# Patient Record
Sex: Female | Born: 1940 | Race: White | Hispanic: No | Marital: Single | Smoking: Former smoker
Health system: Southern US, Community
[De-identification: ages and names within clinical notes are randomized; demographics above are authoritative.]

## PROBLEM LIST (undated history)

## (undated) ENCOUNTER — Emergency Department (HOSPITAL_COMMUNITY): Admission: EM | Payer: Medicare Other | Source: Home / Self Care

## (undated) DIAGNOSIS — I209 Angina pectoris, unspecified: Secondary | ICD-10-CM

## (undated) DIAGNOSIS — F329 Major depressive disorder, single episode, unspecified: Secondary | ICD-10-CM

## (undated) DIAGNOSIS — R531 Weakness: Secondary | ICD-10-CM

## (undated) DIAGNOSIS — J189 Pneumonia, unspecified organism: Secondary | ICD-10-CM

## (undated) DIAGNOSIS — R350 Frequency of micturition: Secondary | ICD-10-CM

## (undated) DIAGNOSIS — J439 Emphysema, unspecified: Secondary | ICD-10-CM

## (undated) DIAGNOSIS — C801 Malignant (primary) neoplasm, unspecified: Secondary | ICD-10-CM

## (undated) DIAGNOSIS — R05 Cough: Secondary | ICD-10-CM

## (undated) DIAGNOSIS — K219 Gastro-esophageal reflux disease without esophagitis: Secondary | ICD-10-CM

## (undated) DIAGNOSIS — F319 Bipolar disorder, unspecified: Secondary | ICD-10-CM

## (undated) DIAGNOSIS — G252 Other specified forms of tremor: Secondary | ICD-10-CM

## (undated) DIAGNOSIS — J040 Acute laryngitis: Secondary | ICD-10-CM

## (undated) DIAGNOSIS — M1990 Unspecified osteoarthritis, unspecified site: Secondary | ICD-10-CM

## (undated) DIAGNOSIS — F419 Anxiety disorder, unspecified: Secondary | ICD-10-CM

## (undated) DIAGNOSIS — G4700 Insomnia, unspecified: Secondary | ICD-10-CM

## (undated) DIAGNOSIS — J449 Chronic obstructive pulmonary disease, unspecified: Secondary | ICD-10-CM

## (undated) DIAGNOSIS — R42 Dizziness and giddiness: Secondary | ICD-10-CM

## (undated) DIAGNOSIS — R609 Edema, unspecified: Secondary | ICD-10-CM

## (undated) DIAGNOSIS — Z8619 Personal history of other infectious and parasitic diseases: Secondary | ICD-10-CM

## (undated) DIAGNOSIS — R0602 Shortness of breath: Secondary | ICD-10-CM

## (undated) DIAGNOSIS — F32A Depression, unspecified: Secondary | ICD-10-CM

## (undated) DIAGNOSIS — R000 Tachycardia, unspecified: Secondary | ICD-10-CM

## (undated) HISTORY — PX: BREAST SURGERY: SHX581

## (undated) HISTORY — PX: CATARACT EXTRACTION: SUR2

## (undated) HISTORY — PX: OTHER SURGICAL HISTORY: SHX169

## (undated) HISTORY — PX: BLADDER SURGERY: SHX569

---

## 2002-03-22 ENCOUNTER — Inpatient Hospital Stay (HOSPITAL_COMMUNITY): Admission: EM | Admit: 2002-03-22 | Discharge: 2002-03-24 | Payer: Self-pay | Admitting: *Deleted

## 2002-03-22 ENCOUNTER — Encounter: Payer: Self-pay | Admitting: *Deleted

## 2002-09-20 ENCOUNTER — Encounter: Payer: Self-pay | Admitting: Emergency Medicine

## 2002-09-20 ENCOUNTER — Emergency Department (HOSPITAL_COMMUNITY): Admission: EM | Admit: 2002-09-20 | Discharge: 2002-09-20 | Payer: Self-pay | Admitting: Emergency Medicine

## 2002-10-06 ENCOUNTER — Emergency Department (HOSPITAL_COMMUNITY): Admission: EM | Admit: 2002-10-06 | Discharge: 2002-10-06 | Payer: Self-pay | Admitting: *Deleted

## 2002-10-23 ENCOUNTER — Emergency Department (HOSPITAL_COMMUNITY): Admission: EM | Admit: 2002-10-23 | Discharge: 2002-10-23 | Payer: Self-pay | Admitting: Emergency Medicine

## 2002-10-28 ENCOUNTER — Encounter: Payer: Self-pay | Admitting: *Deleted

## 2002-10-28 ENCOUNTER — Emergency Department (HOSPITAL_COMMUNITY): Admission: EM | Admit: 2002-10-28 | Discharge: 2002-10-28 | Payer: Self-pay | Admitting: *Deleted

## 2002-11-26 ENCOUNTER — Emergency Department (HOSPITAL_COMMUNITY): Admission: EM | Admit: 2002-11-26 | Discharge: 2002-11-26 | Payer: Self-pay | Admitting: *Deleted

## 2002-11-26 ENCOUNTER — Encounter: Payer: Self-pay | Admitting: *Deleted

## 2002-12-13 ENCOUNTER — Emergency Department (HOSPITAL_COMMUNITY): Admission: EM | Admit: 2002-12-13 | Discharge: 2002-12-13 | Payer: Self-pay | Admitting: Emergency Medicine

## 2003-02-20 ENCOUNTER — Emergency Department (HOSPITAL_COMMUNITY): Admission: EM | Admit: 2003-02-20 | Discharge: 2003-02-20 | Payer: Self-pay | Admitting: Emergency Medicine

## 2003-02-20 ENCOUNTER — Encounter: Payer: Self-pay | Admitting: *Deleted

## 2003-02-28 ENCOUNTER — Emergency Department (HOSPITAL_COMMUNITY): Admission: EM | Admit: 2003-02-28 | Discharge: 2003-02-28 | Payer: Self-pay | Admitting: Emergency Medicine

## 2003-03-08 ENCOUNTER — Emergency Department (HOSPITAL_COMMUNITY): Admission: EM | Admit: 2003-03-08 | Discharge: 2003-03-08 | Payer: Self-pay | Admitting: Emergency Medicine

## 2003-03-17 ENCOUNTER — Encounter: Payer: Self-pay | Admitting: Emergency Medicine

## 2003-03-17 ENCOUNTER — Emergency Department (HOSPITAL_COMMUNITY): Admission: EM | Admit: 2003-03-17 | Discharge: 2003-03-17 | Payer: Self-pay | Admitting: Emergency Medicine

## 2003-03-19 ENCOUNTER — Emergency Department (HOSPITAL_COMMUNITY): Admission: EM | Admit: 2003-03-19 | Discharge: 2003-03-19 | Payer: Self-pay | Admitting: Internal Medicine

## 2003-03-19 ENCOUNTER — Encounter: Payer: Self-pay | Admitting: Internal Medicine

## 2003-03-23 ENCOUNTER — Encounter: Payer: Self-pay | Admitting: *Deleted

## 2003-03-23 ENCOUNTER — Emergency Department (HOSPITAL_COMMUNITY): Admission: EM | Admit: 2003-03-23 | Discharge: 2003-03-23 | Payer: Self-pay | Admitting: *Deleted

## 2003-04-25 ENCOUNTER — Emergency Department (HOSPITAL_COMMUNITY): Admission: EM | Admit: 2003-04-25 | Discharge: 2003-04-25 | Payer: Self-pay | Admitting: *Deleted

## 2003-07-30 ENCOUNTER — Ambulatory Visit (HOSPITAL_COMMUNITY): Admission: RE | Admit: 2003-07-30 | Discharge: 2003-07-30 | Payer: Self-pay | Admitting: Pulmonary Disease

## 2003-08-16 ENCOUNTER — Encounter: Payer: Self-pay | Admitting: Emergency Medicine

## 2003-08-16 ENCOUNTER — Emergency Department (HOSPITAL_COMMUNITY): Admission: EM | Admit: 2003-08-16 | Discharge: 2003-08-16 | Payer: Self-pay | Admitting: Emergency Medicine

## 2003-10-15 ENCOUNTER — Emergency Department (HOSPITAL_COMMUNITY): Admission: EM | Admit: 2003-10-15 | Discharge: 2003-10-15 | Payer: Self-pay | Admitting: Emergency Medicine

## 2003-10-20 ENCOUNTER — Inpatient Hospital Stay (HOSPITAL_COMMUNITY): Admission: EM | Admit: 2003-10-20 | Discharge: 2003-10-23 | Payer: Self-pay | Admitting: Emergency Medicine

## 2003-10-30 ENCOUNTER — Emergency Department (HOSPITAL_COMMUNITY): Admission: EM | Admit: 2003-10-30 | Discharge: 2003-10-30 | Payer: Self-pay | Admitting: Emergency Medicine

## 2003-10-31 ENCOUNTER — Inpatient Hospital Stay (HOSPITAL_COMMUNITY): Admission: EM | Admit: 2003-10-31 | Discharge: 2003-11-12 | Payer: Self-pay | Admitting: Emergency Medicine

## 2003-11-15 ENCOUNTER — Emergency Department (HOSPITAL_COMMUNITY): Admission: EM | Admit: 2003-11-15 | Discharge: 2003-11-16 | Payer: Self-pay | Admitting: *Deleted

## 2004-04-03 ENCOUNTER — Emergency Department (HOSPITAL_COMMUNITY): Admission: EM | Admit: 2004-04-03 | Discharge: 2004-04-03 | Payer: Self-pay | Admitting: Emergency Medicine

## 2004-08-01 ENCOUNTER — Ambulatory Visit (HOSPITAL_COMMUNITY): Admission: RE | Admit: 2004-08-01 | Discharge: 2004-08-01 | Payer: Self-pay | Admitting: Pulmonary Disease

## 2004-10-22 ENCOUNTER — Emergency Department (HOSPITAL_COMMUNITY): Admission: EM | Admit: 2004-10-22 | Discharge: 2004-10-22 | Payer: Self-pay | Admitting: Emergency Medicine

## 2004-11-24 ENCOUNTER — Emergency Department (HOSPITAL_COMMUNITY): Admission: EM | Admit: 2004-11-24 | Discharge: 2004-11-24 | Payer: Self-pay | Admitting: Emergency Medicine

## 2004-12-27 ENCOUNTER — Ambulatory Visit: Payer: Self-pay | Admitting: Family Medicine

## 2004-12-29 ENCOUNTER — Ambulatory Visit (HOSPITAL_COMMUNITY): Admission: RE | Admit: 2004-12-29 | Discharge: 2004-12-29 | Payer: Self-pay | Admitting: Family Medicine

## 2005-01-09 ENCOUNTER — Emergency Department (HOSPITAL_COMMUNITY): Admission: EM | Admit: 2005-01-09 | Discharge: 2005-01-09 | Payer: Self-pay | Admitting: Emergency Medicine

## 2005-01-24 ENCOUNTER — Ambulatory Visit: Payer: Self-pay | Admitting: Family Medicine

## 2005-02-23 ENCOUNTER — Emergency Department (HOSPITAL_COMMUNITY): Admission: EM | Admit: 2005-02-23 | Discharge: 2005-02-23 | Payer: Self-pay | Admitting: Emergency Medicine

## 2005-02-28 ENCOUNTER — Emergency Department (HOSPITAL_COMMUNITY): Admission: EM | Admit: 2005-02-28 | Discharge: 2005-02-28 | Payer: Self-pay | Admitting: *Deleted

## 2005-03-13 ENCOUNTER — Emergency Department (HOSPITAL_COMMUNITY): Admission: EM | Admit: 2005-03-13 | Discharge: 2005-03-14 | Payer: Self-pay | Admitting: *Deleted

## 2005-03-19 ENCOUNTER — Emergency Department (HOSPITAL_COMMUNITY): Admission: EM | Admit: 2005-03-19 | Discharge: 2005-03-19 | Payer: Self-pay | Admitting: Emergency Medicine

## 2005-04-16 ENCOUNTER — Emergency Department (HOSPITAL_COMMUNITY): Admission: EM | Admit: 2005-04-16 | Discharge: 2005-04-16 | Payer: Self-pay | Admitting: Emergency Medicine

## 2005-04-27 ENCOUNTER — Emergency Department (HOSPITAL_COMMUNITY): Admission: EM | Admit: 2005-04-27 | Discharge: 2005-04-27 | Payer: Self-pay | Admitting: *Deleted

## 2005-05-02 ENCOUNTER — Emergency Department (HOSPITAL_COMMUNITY): Admission: EM | Admit: 2005-05-02 | Discharge: 2005-05-03 | Payer: Self-pay | Admitting: *Deleted

## 2005-07-11 ENCOUNTER — Emergency Department (HOSPITAL_COMMUNITY): Admission: EM | Admit: 2005-07-11 | Discharge: 2005-07-11 | Payer: Self-pay | Admitting: Emergency Medicine

## 2005-07-18 ENCOUNTER — Emergency Department (HOSPITAL_COMMUNITY): Admission: EM | Admit: 2005-07-18 | Discharge: 2005-07-18 | Payer: Self-pay | Admitting: Emergency Medicine

## 2005-07-29 ENCOUNTER — Emergency Department (HOSPITAL_COMMUNITY): Admission: EM | Admit: 2005-07-29 | Discharge: 2005-07-30 | Payer: Self-pay | Admitting: Emergency Medicine

## 2005-08-14 ENCOUNTER — Ambulatory Visit (HOSPITAL_COMMUNITY): Admission: RE | Admit: 2005-08-14 | Discharge: 2005-08-14 | Payer: Self-pay | Admitting: Obstetrics & Gynecology

## 2005-08-24 ENCOUNTER — Emergency Department (HOSPITAL_COMMUNITY): Admission: EM | Admit: 2005-08-24 | Discharge: 2005-08-24 | Payer: Self-pay | Admitting: Emergency Medicine

## 2005-08-25 ENCOUNTER — Encounter: Admission: RE | Admit: 2005-08-25 | Discharge: 2005-08-25 | Payer: Self-pay | Admitting: Obstetrics & Gynecology

## 2005-10-23 ENCOUNTER — Ambulatory Visit: Payer: Self-pay | Admitting: Orthopedic Surgery

## 2005-11-03 ENCOUNTER — Observation Stay (HOSPITAL_COMMUNITY): Admission: EM | Admit: 2005-11-03 | Discharge: 2005-11-04 | Payer: Self-pay | Admitting: Emergency Medicine

## 2005-11-15 ENCOUNTER — Ambulatory Visit (HOSPITAL_COMMUNITY): Admission: RE | Admit: 2005-11-15 | Discharge: 2005-11-15 | Payer: Self-pay | Admitting: Pulmonary Disease

## 2005-11-20 ENCOUNTER — Ambulatory Visit: Payer: Self-pay | Admitting: Orthopedic Surgery

## 2005-12-12 ENCOUNTER — Emergency Department (HOSPITAL_COMMUNITY): Admission: EM | Admit: 2005-12-12 | Discharge: 2005-12-12 | Payer: Self-pay | Admitting: Emergency Medicine

## 2005-12-18 ENCOUNTER — Ambulatory Visit: Payer: Self-pay | Admitting: Orthopedic Surgery

## 2006-02-14 ENCOUNTER — Emergency Department (HOSPITAL_COMMUNITY): Admission: EM | Admit: 2006-02-14 | Discharge: 2006-02-14 | Payer: Self-pay | Admitting: Emergency Medicine

## 2006-02-17 ENCOUNTER — Emergency Department (HOSPITAL_COMMUNITY): Admission: EM | Admit: 2006-02-17 | Discharge: 2006-02-17 | Payer: Self-pay | Admitting: Emergency Medicine

## 2006-02-19 ENCOUNTER — Encounter: Admission: RE | Admit: 2006-02-19 | Discharge: 2006-02-19 | Payer: Self-pay | Admitting: Obstetrics & Gynecology

## 2006-03-25 ENCOUNTER — Emergency Department (HOSPITAL_COMMUNITY): Admission: EM | Admit: 2006-03-25 | Discharge: 2006-03-25 | Payer: Self-pay | Admitting: Emergency Medicine

## 2006-04-02 ENCOUNTER — Emergency Department (HOSPITAL_COMMUNITY): Admission: EM | Admit: 2006-04-02 | Discharge: 2006-04-02 | Payer: Self-pay | Admitting: Emergency Medicine

## 2006-08-15 ENCOUNTER — Encounter: Admission: RE | Admit: 2006-08-15 | Discharge: 2006-08-15 | Payer: Self-pay | Admitting: Pulmonary Disease

## 2006-10-28 ENCOUNTER — Inpatient Hospital Stay (HOSPITAL_COMMUNITY): Admission: EM | Admit: 2006-10-28 | Discharge: 2006-11-01 | Payer: Self-pay | Admitting: Emergency Medicine

## 2006-11-01 ENCOUNTER — Inpatient Hospital Stay: Admission: AD | Admit: 2006-11-01 | Discharge: 2007-01-18 | Payer: Self-pay | Admitting: Pulmonary Disease

## 2006-11-28 ENCOUNTER — Ambulatory Visit (HOSPITAL_COMMUNITY): Admission: RE | Admit: 2006-11-28 | Discharge: 2006-11-28 | Payer: Self-pay | Admitting: Pulmonary Disease

## 2006-12-31 ENCOUNTER — Ambulatory Visit (HOSPITAL_COMMUNITY): Admission: RE | Admit: 2006-12-31 | Discharge: 2006-12-31 | Payer: Self-pay | Admitting: Pulmonary Disease

## 2007-01-03 ENCOUNTER — Ambulatory Visit (HOSPITAL_COMMUNITY): Admission: RE | Admit: 2007-01-03 | Discharge: 2007-01-03 | Payer: Self-pay | Admitting: Pulmonary Disease

## 2007-08-26 ENCOUNTER — Ambulatory Visit (HOSPITAL_COMMUNITY): Admission: RE | Admit: 2007-08-26 | Discharge: 2007-08-26 | Payer: Self-pay | Admitting: Pulmonary Disease

## 2007-11-14 ENCOUNTER — Encounter: Payer: Self-pay | Admitting: Family Medicine

## 2009-08-31 ENCOUNTER — Ambulatory Visit (HOSPITAL_COMMUNITY): Admission: RE | Admit: 2009-08-31 | Discharge: 2009-08-31 | Payer: Self-pay | Admitting: Pulmonary Disease

## 2009-12-07 ENCOUNTER — Ambulatory Visit (HOSPITAL_COMMUNITY): Admission: RE | Admit: 2009-12-07 | Discharge: 2009-12-07 | Payer: Self-pay | Admitting: Ophthalmology

## 2009-12-21 ENCOUNTER — Ambulatory Visit (HOSPITAL_COMMUNITY): Admission: RE | Admit: 2009-12-21 | Discharge: 2009-12-21 | Payer: Self-pay | Admitting: Ophthalmology

## 2010-12-13 NOTE — Letter (Signed)
Summary: rpc chart  rpc chart   Imported By: Curtis Sites 08/15/2010 11:23:18  _____________________________________________________________________  External Attachment:    Type:   Image     Comment:   External Document

## 2011-01-29 LAB — BASIC METABOLIC PANEL
Chloride: 97 mEq/L (ref 96–112)
Creatinine, Ser: 0.76 mg/dL (ref 0.4–1.2)
Glucose, Bld: 89 mg/dL (ref 70–99)
Sodium: 140 mEq/L (ref 135–145)

## 2011-01-29 LAB — HEMOGLOBIN AND HEMATOCRIT, BLOOD: HCT: 31.6 % — ABNORMAL LOW (ref 36.0–46.0)

## 2011-03-31 NOTE — Group Therapy Note (Signed)
NAME:  April Pearson, April Pearson                        ACCOUNT NO.:  [id number]   MEDICAL RECORD NO.:  [id number]                   PATIENT TYPE:  INP   LOCATION:  A310                                 FACILITY:  [facility]   PHYSICIAN:  Edward L. Juanetta Gosling, M.D.             DATE OF BIRTH:  03-May-1941   DATE OF PROCEDURE:  DATE OF DISCHARGE:                                   PROGRESS NOTE   PROBLEM LIST:  1. Chronic obstructive pulmonary disease with exacerbation.  2. 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   SUBJECTIVE:  April Pearson is very sluggish this morning. She has become less  and less independent, and this seems to be at least to some extent  correlated with the increase in the dose of her Risperdal. She is otherwise  doing about the same. She is less short of breath and looks a little better  as far as her breathing is concerned.   PHYSICAL EXAMINATION:  GENERAL:  Shows that she appears somewhat sedated  probably from her Risperdal and Xanax.  VITAL SIGNS:  Blood pressure 92/53, heart rate is 99 now, respirations 24  but unlabored, her temperature is 98.1. Her O2 saturation is 96% on 2  liters.   ASSESSMENT:  She seems to be doing better lungwise but having much more  problem as far as her ability to get around. She has what looks like a skin  tear on her buttocks. She is not getting up and moving around.   PLAN:  The plan is to try to get her up and moving. Get PT involved, which  they already are. Continue with her other treatments. We are working on  placement issues. I think she is definitely going to need short term  placement before she can return home.      ___________________________________________                                            Oneal Deputy. Juanetta Gosling, M.D.   ELH/MEDQ  D:  11/11/2003  T:  11/11/2003  Job:  [id number]

## 2011-03-31 NOTE — Group Therapy Note (Signed)
April Pearson, April Pearson              ACCOUNT NO.:  [id number]   MEDICAL RECORD NO.:  [id number]          PATIENT TYPE:  ORB   LOCATION:  S122                          FACILITY:  [facility]   PHYSICIAN:  Edward L. Juanetta Gosling, M.D.DATE OF BIRTH:  09-07-1941   DATE OF PROCEDURE:  12/29/2006  DATE OF DISCHARGE:                                 PROGRESS NOTE   Ms. Steven is doing fairly well but has an upper respiratory infection.  She has also had a lesion on the back of her head that seems to be  growing.  She says it has been there for sometime but it has grown since  she has been as the skilled care facility.  She is very weak still,  still having difficulty with PT, still using O2, nebulizers.   PHYSICAL EXAMINATION:  GENERAL:  Shows she is awake and alert, sitting  up.  CHEST:  Shows markedly decreased breath sounds.  She remains very weak.  HEENT:  She has what feels like a bony prominence on the back of her  head, on the right side near the occiput.  HEART:  Regular.   She is in a wheelchair.   ASSESSMENT:  1. She has profound weakness improving.  2. 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  She is not on      steroids.  I do not think that is related now.  3. She has severe chronic obstructive pulmonary disease which is end      stage on oxygen, on nebulizers.  4. She has mental illness, unchanged, doing fairly well.  5. She has hypertension, doing okay.  6. She has chronic tachycardia.  Her medications for hypertension seem      to be helping that as well.  7. She has a large lesion on the back of her skull.  We are going to      do an x-ray of that.  8. She has had an upper respiratory infection for which she is being      treated.      Edward L. Juanetta Gosling, M.D.  Electronically Signed     ELH/MEDQ  D:  12/29/2006  T:  12/29/2006  Job:  [id number]

## 2011-03-31 NOTE — Group Therapy Note (Signed)
NAME:  April Pearson, April Pearson                        ACCOUNT NO.:  [id number]   MEDICAL RECORD NO.:  [id number]                   PATIENT TYPE:  INP   LOCATION:  A310                                 FACILITY:  [facility]   PHYSICIAN:  Edward L. Juanetta Gosling, M.D.             DATE OF BIRTH:  1941/07/18   DATE OF PROCEDURE:  11/09/2003  DATE OF DISCHARGE:                                   PROGRESS NOTE   PROBLEMS:  1. Chronic obstructive pulmonary disease with exacerbation.  2. Mental illness.   SUBJECTIVE:  April Pearson says she is weak and cannot get out of the bed.  She  is actually sitting up in a chair right now.   OBJECTIVE:  Her exam shows her temperature is 98.3, pulse 116, respirations  18, blood pressure 133/73.  O2 saturation is 95% on 2 liters.  Her chest is  actually relatively clear.  She has some rhonchi but no wheezes now.  She  looks like she may have retained some fluid - probably from her steroid use.   ASSESSMENT:  Her heart rate is not as well controlled as I would like but I  think some of that is due to her weakness.  Her chest sounds better.  Her  blood pressure is under good control.   PLAN:  My plan is to have her continue her regular medications as before and  try to get PT involved and see if it will increase her ability to get  around.      ___________________________________________                                            Oneal Deputy Juanetta Gosling, M.D.   ELH/MEDQ  D:  11/09/2003  T:  11/09/2003  Job:  [id number]

## 2011-03-31 NOTE — H&P (Signed)
NAME:  April Pearson, April Pearson                        ACCOUNT NO.:  [id number]   MEDICAL RECORD NO.:  [id number]                   PATIENT TYPE:  INP   LOCATION:  A310                                 FACILITY:  [facility]   PHYSICIAN:  Mila Homer. Sudie Bailey, M.D.           DATE OF BIRTH:  02/15/1941   DATE OF ADMISSION:  10/31/2003  DATE OF DISCHARGE:                                HISTORY & PHYSICAL   HISTORY OF PRESENT ILLNESS:  This 70 year old woman has had several days of  difficulty breathing. She was seen in the emergency room the day prior to  admission at which time she had a white cell count of 16,000.  Chest x-ray  at that time showed COPD. CT of the chest done October 21, 2003, showed COPD  with some mild pericardial effusion.  In fact, she became so tight that she  came up to the emergency room today.   MEDICATIONS:  1. She is currently on Xanax 1 mg p.o. t.i.d.  2. Advair 250/50 b.i.d.  3. Risperdal at bedtime.  4. Cartia XT 180 mg daily.  5. Mucinex expectorant 600 mg b.i.d.  6. Lasix daily.   SOCIAL HISTORY:  She smoked cigarettes starting from at age 53 and ending at  age 50, with a total of 44 years. For many of the years she was smoking two  packs a day for a maximum total of 88-pack-years.  Currently, her mother  lives in [place]. She has at least one sister who lives here as well.   PHYSICAL EXAMINATION:  GENERAL:  Admission examination in the emergency room  showed a pleasant middle-age woman.  VITAL SIGNS:  Temperature is 97.2 p.o., BP 120/76, heart rate 107,  respiratory rate 24.  According to the ER doctor, she was so tight she was  not even wheezing, but after a couple of breathing treatments she started to  wheeze.  NECK:  There was no axillary or supraclavicular adenopathy.  LUNGS:  The lungs showed decreased breath sounds throughout, but she was  moving air at the time of my examination. She had no intercostal retractions  or use of accessory muscles for  respiration by the time I examined her.  HEART:  Regular rhythm, rate of about 90.  ABDOMEN:  Soft, without hepatosplenomegaly or mass.  EXTREMITIES:  No edema of the ankles.   LABORATORY DATA:  Blood gases on room air show pH 7.49, pCO2 40, pO2 57, and  oxygen saturation 93%.  White cell count 12,000 with 91% neutrophils.  D-  dimer was 22.   ADMISSION DIAGNOSES:  1. Chronic obstructive pulmonary disease exacerbation.  2. History of tobacco use.  3. Essential hypertension.   PLAN:  Solu-Medrol 125 mg IV q.6 h., Rocephin 1 g IV daily, Levaquin 5 mg  daily, albuterol/ipratropium nebulizer treatments q.4 h. while awake, p.r.n.  Tylenol, and alprazolam 1 mg t.i.d.     ___________________________________________  Mila Homer. Sudie Bailey, M.D.   SDK/MEDQ  D:  10/31/2003  T:  10/31/2003  Job:  [id number]   cc:   Ramon Dredge L. Juanetta Gosling, M.D.  [address]  [place]  [place] [address]  Fax: [phone]

## 2011-03-31 NOTE — Discharge Summary (Signed)
April Pearson, April Pearson              ACCOUNT NO.:  [id number]   MEDICAL RECORD NO.:  [id number]          PATIENT TYPE:  INP   LOCATION:  A319                          FACILITY:  [facility]   PHYSICIAN:  Edward L. Juanetta Gosling, M.D.DATE OF BIRTH:  06/18/1941   DATE OF ADMISSION:  10/28/2006  DATE OF DISCHARGE:  LH                               DISCHARGE SUMMARY   Potentially being transferred to a nursing home.   FINAL DISCHARGE DIAGNOSES:  1. Chronic obstructive pulmonary disease.  2. Mental illness with anxiety and depression.  3. Osteoporosis.  4. Tachycardia.  5. Hypertension.  6. Significant muscle weakness causing ambulatory difficulty.   HISTORY OF PRESENT ILLNESS:  This is a 70 year old with a long known  history of COPD. She has been in the emergency room and in my office on  multiple occasions over the last year. She has had problems in the past  with steroid myopathy so I have not been able to give her steroids for  this.   PAST MEDICAL HISTORY:  Positive for mental illness, COPD, osteoporosis,  tachycardia, and hypertension. Her mental illness seems to be related to  her COPD, but is a separate diagnosis.   PHYSICAL EXAMINATION ON ADMISSION:  VITAL SIGNS:  Temperature 96.5,  pulse 113, respirations 20, blood pressure 153/96, O2 sat 96%.  CHEST:  With decreased breath sounds. Mild wheezing.  HEART:  Regular with tachycardia.  ABDOMEN:  Soft.   pO2 was 56, pCO2 45, pH 7.4 on room air. Chest x-ray showed no acute  process. CT chest showed no pulmonary emboli or COPD, but did show  coronary artery calcifications.   HOSPITAL COURSE:  She was started on steroids initially. These were  discontinued. She had a PT consultation. It was felt that she was going  to need a nursing home placement for rehab. She is improved, but has a  great deal of difficulty with ambulation and she is discharged on Xanax  1 mg p.o. t.i.d., a no added salt regular diet, aspirin 81 mg daily,  diltiazem CD  240 one daily, Prozac 20 mg daily, Advair 1 puff b.i.d. of  the 250/50 size, Xopenex and  Atrovent nebulizer treatments 1.25 and 0.5 mg respectively q.8 h.,  Levaquin 500 mg daily, Risperdal 2 mg at bedtime, and she is going to  have PT/OT consultations in attempt to get her stronger and then back  home eventually. She will be on O2 at 2 liters.      Edward L. Juanetta Gosling, M.D.  Electronically Signed     ELH/MEDQ  D:  11/01/2006  T:  11/01/2006  Job:  [id number]

## 2011-03-31 NOTE — H&P (Signed)
NAME:  April Pearson, April Pearson                        ACCOUNT NO.:  [id number]   MEDICAL RECORD NO.:  [id number]                   PATIENT TYPE:  INP   LOCATION:  A309                                 FACILITY:  [facility]   PHYSICIAN:  Edward L. Juanetta Gosling, M.D.             DATE OF BIRTH:  12/28/1940   DATE OF ADMISSION:  10/20/2003  DATE OF DISCHARGE:                                HISTORY & PHYSICAL   REASON FOR ADMISSION:  COPD with exacerbation.   HISTORY:  This is a 70 year old who has been complaining of increasing  shortness of breath for about three months.  She has been to my office on  multiple occasions with really fairly normal physical examination except for  changes of COPD.  She has been on antibiotics, she has been on Advair and  has had very little relief.  She  came to the emergency room with increasing  shortness of breath, and after a workup in the emergency room she was  admitted.  She has had multiple steroid Dosepaks.  She has been on  antibiotics.  She was somewhat hypoxemic on room air.   PAST MEDICAL HISTORY:  Positive for:  1. COPD.  2. Episodes of pneumonia.  3. Severe anxiety and depression.  4. Tachycardia.   MEDICATIONS:  1. She has been on multiple antibiotics at home.  2. She has been on Risperdal, but I do not know the dose; she has been     [facility].  3. Xanax 1 mg p.o. three times daily.  4. Advair 250/50, 1 inhalation b.i.d.  5. Cardizem CD 180 daily.  6. Mucinex 600 mg b.i.d.   SOCIAL HISTORY:  She has a long smoking history but stopped some four or  five years ago.  She does not drink any alcohol.   REVIEW OF SYSTEMS:  Except as mentioned is negative.   FAMILY HISTORY:  Positive for hypertension.   PHYSICAL EXAMINATION:  GENERAL:  Today she looks fairly comfortable.  VITAL SIGNS:  Blood pressure 93/64, then 123/75, temperature is 99.2, pulse  112, respirations 20.  HEENT:  Her mucous membranes are slightly dry.   Her nose and throat are  clear.  NECK:  Supple.  Without masses.  CHEST:  Decreased breath sounds but no wheezes at all right now.  HEART:  Regular, with a mild tachycardia.  ABDOMEN:  Soft.  No masses are felt.  EXTREMITIES:  No edema.  CNS:  Grossly intact except she appears to be somewhat anxious.   LABORATORY DATA:  White count is 9400, hemoglobin is 12.1.  Her d-dimer is  0.22, which is normal.  Blood gas on room air shows a pH of 7.44, pCO2 of  40, pO2 of 65.  Electrolytes are normal.  Cardiac enzymes are normal.  BNP  is 42.2, which is normal.   ASSESSMENT:  She has shortness of breath and does have  a well-known history  of chronic obstructive pulmonary disease, but it is not clear if her problem  now is more related to her chronic obstructive pulmonary disease or perhaps  to anxiety or exactly what is going on.   PLAN:  The plan at this point is to go ahead with a CT scan of the chest.  Continue with her other treatments, nebulizers, antibiotics, etc., and  follow.     ___________________________________________                                         Oneal Deputy Juanetta Gosling, M.D.   ELH/MEDQ  D:  10/21/2003  T:  10/21/2003  Job:  [id number]

## 2011-03-31 NOTE — H&P (Signed)
April Pearson, ACKERT              ACCOUNT NO.:  [id number]   MEDICAL RECORD NO.:  [id number]          PATIENT TYPE:  INP   LOCATION:  A228                          FACILITY:  [facility]   PHYSICIAN:  Edward L. Juanetta Gosling, M.D.DATE OF BIRTH:  10-09-1941   DATE OF ADMISSION:  10/28/2006  DATE OF DISCHARGE:  [facility]                              HISTORY & PHYSICAL   REASON FOR ADMISSION:  COPD exacerbation.   HISTORY:  This is a 70 year old who has had a long known history of COPD  and who has had significant problems with multiple emergency room  visits, multiple visits to my office over the last year or so.  She has  had increasing shortness of breath for several days.  She has been  coughing, bringing up some sputum.  She has had more shortness of  breath.  She has not had any definitive fever.   PAST MEDICAL HISTORY:  1. [facility] She does have anxiety and depression.  2. She has COPD.  3. She has osteoporosis.  4. She has had tachycardia and hypertension.   FAMILY HISTORY:  Positive for coronary disease and hypertension.   PAST SURGICAL HISTORY:  1. She has had tubal ligation.  2. Lumpectomy of the breast.   SOCIAL HISTORY:  She has about a 70 pack-year smoking history, but  stopped about 5 years ago.  She does not drink any alcohol.  She does  not use any other drugs.   She has difficulty tolerating STEROIDS, and had an episode of steroid  myopathy, and we have tried to avoid the use of steroids with her.   She unfortunately has. despite being instruction in the office on  multiple occasions that she needs to stay on Advair as a preventive  medication, she almost always uses it as a p.r.n., and she is off her  Advair again.  I suspect that that is why she is having this acute  exacerbation.   MEDICATIONS AT HOME:  1. Xanax 1 mg t.i.d.  2. Risperdal 2 mg at bedtime.  3. Advair 250/50, supposed to be 1 puff b.i.d.,  but she uses it as      needed.  4. Albuterol inhaler as needed.  5. Prozac 20 mg daily.  6. She is supposed to be on Cardizem, but apparently is not taking it.   PHYSICAL EXAMINATION:  GENERAL:  Alert and oriented.  HEENT:  Her nose and throat are clear.  Her neck is supple, without  masses.  Her pupils do react to light and accommodation.  Her mucous  membranes are minimally dry.  VITAL SIGNS:  Her temperature is 96.5, pulse 113, respirations 20, blood  pressure 153/96, O2 saturations 96% on 2 liters.  CHEST:  Shows mild wheezing, some decrease in breath sounds.  HEART:  Regular, with a tachycardia.  No murmur.  No gallop.  ABDOMEN:  Soft without masses.  She does have active bowel sounds.  EXTREMITIES:  Showed no edema.  CNS:  She is grossly intact.  PSYCHIATRIC:  She remains with a somewhat flat affect.   LABORATORY WORK:  White count is 9500, hemoglobin is 13.8, platelets  427.  BMP:  Glucose 111, otherwise normal.  D-dimer 0.22, which is  normal. Blood gas:  pO2 is 56, pCO2 of 45, pH 7.40.  Chest x-ray:  No  acute cardiopulmonary process. CT chest: No pulmonary emboli, COPD,  coronary artery calcifications.   ASSESSMENT:  She has chronic obstructive pulmonary disease with  exacerbation.  She has a chronic tachycardia and hypertension.  She is  off her medication for that. She has had difficulty with understanding  how to take her preventive medications, and at this point I think we  need to go ahead with a short burst of steroids.  I do not want her on  them for very long. Continue with all the other treatments and follow.      Edward L. Juanetta Gosling, M.D.  Electronically Signed     ELH/MEDQ  D:  10/29/2006  T:  10/29/2006  Job:  [id number]
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
## 2011-03-31 NOTE — Group Therapy Note (Signed)
NAME:  April Pearson, April Pearson                        ACCOUNT NO.:  [id number]   MEDICAL RECORD NO.:  [id number]                   PATIENT TYPE:  INP   LOCATION:  A310                                 FACILITY:  [facility]   PHYSICIAN:  Edward L. Juanetta Gosling, M.D.             DATE OF BIRTH:  [birth year]   DATE OF PROCEDURE:  DATE OF DISCHARGE:                                   PROGRESS NOTE   PROBLEMS:  1. Chronic obstructive pulmonary disease with exacerbation.  2. Hypertension.  3. Sinus tachycardia.  4. Mental illness with marked anxiety related to that.   SUBJECTIVE:  Ms. Bradstreet says she is still having  a great deal of difficulty  with breathing.  She was admitted yesterday with an exacerbation of her  COPD.   PHYSICAL EXAMINATION:  VITAL SIGNS:  Show her temperature is 97.6, pulse is  84, respirations 18, blood pressure 124/76.  O2 sat 96% on 2 liters.  GENERAL:  She actually looks pretty comfortable.  CHEST:  Decreased breath sounds bilaterally.  HEART:  Regular without tachycardia now.   ASSESSMENT:  She has severe chronic obstructive pulmonary disease and also  consequential severe anxiety and these are feeding each other.  Her blood  pressure is well controlled.  She does not have any tachycardia at this  point.   PLAN:  Continue with her treatment.  There is really not much else to add.  She is on maximum medical treatment at this point.      ___________________________________________                                            Oneal Deputy. Juanetta Gosling, M.D.   ELH/MEDQ  D:  11/01/2003  T:  11/01/2003  Job:  [id number]

## 2011-03-31 NOTE — Group Therapy Note (Signed)
NAME:  April Pearson, April Pearson                        ACCOUNT NO.:  [id number]   MEDICAL RECORD NO.:  [id number]                   PATIENT TYPE:  INP   LOCATION:  A310                                 FACILITY:  [facility]   PHYSICIAN:  Edward L. Juanetta Gosling, M.D.             DATE OF BIRTH:  1941-08-08   DATE OF PROCEDURE:  11/06/2003  DATE OF DISCHARGE:                                   PROGRESS NOTE   PROBLEMS:  1. Chronic obstructive pulmonary disease.  2. Mental illness.  3. Hypertension.   SUBJECTIVE:  April Pearson says she feels better.  This is the first time she  has said that she felt better in some time.  The evaluation by the  behavioral health team is noted and appreciated.   OBJECTIVE:  Her physical exam today shows that she looks a little more  comfortable.  Her temperature is 99.2, pulse 104, respirations 18, blood  pressure 128/90, O2 saturation is 93% on room air.  Her chest is much  clearer but she still has wheezes at end expiration.   ASSESSMENT:  She is better but not well.   PLAN:  Continue with her treatments as before.  At least she is improved  somewhat.      ___________________________________________                                            Oneal Deputy. Juanetta Gosling, M.D.   ELH/MEDQ  D:  11/06/2003  T:  11/06/2003  Job:  [id number]

## 2011-03-31 NOTE — Group Therapy Note (Signed)
NAME:  April Pearson, April Pearson                        ACCOUNT NO.:  [id number]   MEDICAL RECORD NO.:  [id number]                   PATIENT TYPE:  INP   LOCATION:  A310                                 FACILITY:  [facility]   PHYSICIAN:  Edward L. Juanetta Gosling, M.D.             DATE OF BIRTH:  05-12-1941   DATE OF PROCEDURE:  DATE OF DISCHARGE:                                   PROGRESS NOTE   PROBLEM LIST:  1. Chronic obstructive pulmonary disease with exacerbation.  2. Hypertension.  3. Sinus tachycardia.  4. Mental illness.   SUBJECTIVE:  Ms. Graff says she is a little better. She continues to be  short of breath with fairly minimal exertion.   OBJECTIVE:  VITAL SIGNS:  Her exam today shows her blood pressure 102/72,  heart rate 110, respirations 20, temperature 97.1, O2 saturation 96% on 2  liters.  CHEST:  Her chest is clearer, though she does have bilateral rhonchi.  HEART:  Her heart is regular.  ABDOMEN:  Soft.  EXTREMITIES:  She has trace edema of the extremities.   ASSESSMENT:  She is a little better. I have discussed the situation with the  nursing staff. They are concerned that she just doesn't seem quite right  today, perhaps a little more pale, just not doing as well; so I am going to  go head and check a BMET and CBC today. As mentioned, her O2 saturation is  okay.   PLAN:  Plan now is definitely for at least short term nursing home  placement, as she is really not going to be able to manage at home.      ___________________________________________                                            Oneal Deputy. Juanetta Gosling, M.D.   ELH/MEDQ  D:  11/10/2003  T:  11/10/2003  Job:  [id number]

## 2011-03-31 NOTE — Group Therapy Note (Signed)
[facility]  Patient:    [name], [name] Visit Number: [id number] MRN: [id number]          Service Type: MED Location: 3A A320 01 Attending Physician:  Fredirick Maudlin Dictated by:   Kari Baars, M.D. Proc. Date: 03/23/02 Admit Date:  03/22/2002 Discharge Date: 03/24/2002                               Progress Note  PROBLEMS 1. Chronic obstructive pulmonary disease. 2. Pneumonia. 3. Anxiety and depression.  SUBJECTIVE:  [name] says she is feeling much better this morning.  She has no new complaints.  OBJECTIVE:  Her physical examination shows that her chest is clearer than it has been.  By description her heart is regular.  Her abdomen is soft. Extremities:  Showed no edema.  ASSESSMENT:  She is much improved.  PLAN:  For transfer to a regular room today.  She does not need the monitor any more.  Will attempt to try to get her ready for a possible discharge tomorrow. Dictated by:   Kari Baars, M.D. Attending Physician:  Fredirick Maudlin DD:  03/23/02 TD:  03/24/02 Job: [id number]

## 2011-03-31 NOTE — Discharge Summary (Signed)
[facility]  Patient:    CAMALA, TALWAR Visit Number: [id number] MRN: [id number]          Service Type: MED Location: 3A A320 01 Attending Physician:  Fredirick Maudlin Dictated by:   Kari Baars, M.D. Admit Date:  03/22/2002 Discharge Date: 03/24/2002                             Discharge Summary  FINAL DISCHARGE DIAGNOSES: 1. Pneumonia. 2. Chronic obstructive pulmonary disease. 3. Anxiety and depression.  HISTORY:  Ms. Garside is a 70 year old who came to the emergency room with complaints of shortness of breath.  Was found to have a right lung pneumonia. Her examination showed that she was febrile.  She appeared to be short of breath.  Her chest showed rhonchi bilaterally.  Her abdomen was soft.  ASSESSMENT:  At the time of admission was that she had pneumonia.  PLAN:  Was to continue her treatments with her anxiolytics and antidepressants.  She was started on intravenous antibiotics, intravenous fluids, and improved fairly rapidly.  By the time of discharge she said she was back to her baseline and she was discharged home on Levaquin 500 mg daily x10 more days.  She is going to continue her nebulizer treatments at home and follow up in my office in about a month. Dictated by:   Kari Baars, M.D. Attending Physician:  Fredirick Maudlin DD:  03/24/02 TD:  03/25/02 Job: [id number]

## 2011-03-31 NOTE — Group Therapy Note (Signed)
NAMEDETRICE, CALES              ACCOUNT NO.:  [id number]   MEDICAL RECORD NO.:  [id number]          PATIENT TYPE:  INP   LOCATION:  A319                          FACILITY:  [facility]   PHYSICIAN:  Edward L. Juanetta Gosling, M.D.DATE OF BIRTH:  09/18/1941   DATE OF PROCEDURE:  DATE OF DISCHARGE:                                 PROGRESS NOTE   PROBLEM:  COPD exacerbation, weakness, mental illness, hypertension.   SUBJECTIVE:  Ms. Beever is sleepy this morning.  I believe she got a  sleeping pill last night.  Otherwise, she is about the same.   PHYSICAL EXAMINATION:  VITAL SIGNS:  Temperature is 97, pulse 85,  respirations 16, blood pressure 129/65, O2 saturations 95% on 2 liters.  CHEST:  Her chest is much clearer, and she overall looks better.   ASSESSMENT:  She is improving.   PLAN:  She is set for discharge to a nursing home when a bed is  available.  Please see discharge summary for details.      Edward L. Juanetta Gosling, M.D.  Electronically Signed     ELH/MEDQ  D:  11/01/2006  T:  11/01/2006  Job:  [id number]

## 2011-03-31 NOTE — Discharge Summary (Signed)
NAME:  April Pearson, April Pearson                        ACCOUNT NO.:  [id number]   MEDICAL RECORD NO.:  [id number]                   PATIENT TYPE:  INP   LOCATION:  A310                                 FACILITY:  [facility]   PHYSICIAN:  Edward L. Juanetta Gosling, M.D.             DATE OF BIRTH:  Jun 04, 1941   DATE OF ADMISSION:  10/31/2003  DATE OF DISCHARGE:                                 DISCHARGE SUMMARY   FINAL DIAGNOSES:  1. Chronic obstructive pulmonary disease with exacerbation.  2. 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  3. Hypertension.  4. Chronic tachycardia.  5. Decreased ability to ambulate related to bedrest.   HISTORY:  [name] is a 70 year old who has had a long known history of  COPD. She had been seen in the emergency room on several occasions. At the  time that she came for admission, she was noted to have a white count of  16,000. Chest x-ray showed COPD without infiltrate. Her physical exam showed  that she was very anxious. Temperature 97.2, blood pressure 120/76, heart  rate 107, respiratory rate 24. She had decreased breath sounds. She was  quite tight but had some wheezing. She did have some use of accessory  muscles of respiratory early in her course, but this resolved later. Blood  gas on admission showed a pH of 7.49, pCO2 of 40, pO2 of 57, O2 saturation  at 93% on room air. White count was 12,000. D-dimer 2.2, but she had just  had a CT of the chest.   HOSPITAL COURSE:  She was admitted, started on nebulizer treatments, and  slowly improved. A lot of the problem during her hospitalization was with  her mental illness. Consultation was obtained with the behavioral health,  and it was felt that she should have an increased dose of Risperdal. She  became somewhat sluggish and somnolent with that, so she had the dose  decreased. At the time of discharge, she is discharged to the nursing home  on:  1. Albuterol plus Atrovent nebulizer treatments q.4h. p.r.n.  2. Xanax 1 mg p.o.  q.6h. on a scheduled basis but it can be held if she is     too somnolent.  3. Advair 250/50 one puff b.i.d.  4. Guaifenesin 1,200 mg p.o. b.i.d.  5. KCl 20 mEq b.i.d.  6. Prednisone 40 mg daily for three days, 30 mg daily for three days, 20 mg     daily for three days, and then 10 mg daily for three days and stop.  7. Risperdal 1 mg in the morning, 2 mg at h.s.  8. Theophylline 200 mg p.o. b.i.d.  9. Cardizem CD 180 one daily.  10.      She is on O2 at 2 liters.   DIET:  She will be on a no-added salt regular diet.   FOLLOW UP:  She does need to make an appointment for followup at the mental  health center. She needs physical therapy, occupational therapy, and  respiratory therapy treatments while in the nursing home.     ___________________________________________                                         Oneal Deputy. Juanetta Gosling, M.D.   ELH/MEDQ  D:  11/12/2003  T:  11/12/2003  Job:  [id number]

## 2011-03-31 NOTE — Group Therapy Note (Signed)
NAME:  April Pearson, April Pearson                        ACCOUNT NO.:  [id number]   MEDICAL RECORD NO.:  [id number]                   PATIENT TYPE:  INP   LOCATION:  A310                                 FACILITY:  [facility]   PHYSICIAN:  Edward L. Juanetta Gosling, M.D.             DATE OF BIRTH:  07-27-1941   DATE OF PROCEDURE:  11/12/2003  DATE OF DISCHARGE:                                   PROGRESS NOTE   PROBLEMS:  1. Chronic obstructive pulmonary disease with exacerbation.  2. Hypertension.  3. Chronic sinus tachycardia.  4. Mental illness, unknown type with severe anxiety.   HISTORY OF PRESENT ILLNESS:  April Pearson is much improved today.  She is less  sluggish, much more responsive.   PHYSICAL EXAMINATION:  VITAL SIGNS:  Temperature 98.2, pulse 109,  respirations 20, blood pressure 102/56 (It has been 122/72).  CHEST:  Clearer.  O2 saturation 94% on 2 liters.  HEART:  Regular with a slight tachycardia.   IMPRESSION:  Overall, she looks much better.   PLAN:  She is set for transfer to a skilled care facility.  Please see  Discharge Summary for details.      ___________________________________________                                            Oneal Deputy Juanetta Gosling, M.D.   ELH/MEDQ  D:  11/12/2003  T:  11/12/2003  Job:  [id number]

## 2011-03-31 NOTE — Group Therapy Note (Signed)
NAMEJOSSELIN, April Pearson              ACCOUNT NO.:  [id number]   MEDICAL RECORD NO.:  [id number]          PATIENT TYPE:  ORB   LOCATION:  S122                          FACILITY:  [facility]   PHYSICIAN:  Edward L. Juanetta Gosling, M.D.DATE OF BIRTH:  Sep 16, 1941   DATE OF PROCEDURE:  DATE OF DISCHARGE:                                 PROGRESS NOTE   The patient at the [facility].  [name] is overall, I think, about  the same.  She says she is breathing okay.  She is using her oxygen.  She is using nebulizers.  She is perhaps some better.  She does not have  any other new complaints.  She is slowly progressing in PT but still  quite weak and not able yet to get to manage on her own.   ASSESSMENT:  1. She has profound weakness.  2. She has a history of steroid myopathy, but she is not on any      steroids.  3. She has severe end-stage chronic obstructive pulmonary disease.  4. She has mental illness which is unchanged.  5. She has hypertension which is doing okay.  6. She has tachycardia which is also doing okay.   PLAN:  My plan then is to continue with her medications and treatments  and have her follow from there.  No other new medications right now.  Hopefully, she is going to get well enough to be able to go home, but it  is not totally clear.      Edward L. Juanetta Gosling, M.D.  Electronically Signed     ELH/MEDQ  D:  11/21/2006  T:  11/21/2006  Job:  [id number]

## 2011-03-31 NOTE — Group Therapy Note (Signed)
NAME:  April Pearson, GUTMANN                        ACCOUNT NO.:  [id number]   MEDICAL RECORD NO.:  [id number]                   PATIENT TYPE:  INP   LOCATION:  A309                                 FACILITY:  [facility]   PHYSICIAN:  Edward L. Juanetta Gosling, M.D.             DATE OF BIRTH:  Apr 29, 1941   DATE OF PROCEDURE:  10/23/2003  DATE OF DISCHARGE:  10/23/2003                                   PROGRESS NOTE   PROBLEMS:  1. Chronic obstructive pulmonary disease with exacerbation.  2. Anxiety and depression.  3. 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   SUBJECTIVE:  Ms. Allmendinger is about the same.  She has no new complaints at this  time.  She has discussed with our social worker that she feels part of her  problem is that she becomes very anxious and has been in and out of my  office with shortness of breath that she thinks is started by anxiety.  Multiple options were offered to her to help with this including placement  in a rest home - which actually I think is a reasonable solution, but Ms.  Coba does not want to do that.  Considering that, I am not sure what other  options we have at this point.   OBJECTIVE:  Her exam today shows her blood pressure 125/54, heart rate 73,  respirations 20, temperature is 97.4, O2 saturation 97% on 2 liters.  Her  chest is actually quite clear.   ASSESSMENT:  She is doing better.   PLAN:  I am going to plan for her to be discharged today.  Please see  Discharge Summary for details.      ___________________________________________                                            Oneal Deputy Juanetta Gosling, M.D.   ELH/MEDQ  D:  10/23/2003  T:  10/23/2003  Job:  [id number]

## 2011-03-31 NOTE — Group Therapy Note (Signed)
NAME:  April Pearson, SHORTS                        ACCOUNT NO.:  [id number]   MEDICAL RECORD NO.:  [id number]                   PATIENT TYPE:  INP   LOCATION:  A310                                 FACILITY:  [facility]   PHYSICIAN:  Edward L. Juanetta Gosling, M.D.             DATE OF BIRTH:  [birth year]   DATE OF PROCEDURE:  11/03/2003  DATE OF DISCHARGE:                                   PROGRESS NOTE   PROBLEM:  Chronic obstructive pulmonary disease.   SUBJECTIVE:  Ms. Schwark continues short of breath.  She says she is not  coughing anything up.  She is coughing a little bit.   OBJECTIVE:  Her physical exam shows that her temperature is 97, pulse 106,  respirations 22, blood pressure 151/92.  Her chest is fairly clear, she has  minimal wheezes.  Her O2 saturation is 94% on 2 liters.   ASSESSMENT:  She still has problems with her COPD.  Her blood pressure is  not totally controlled.  Her heart rate is also not totally controlled.  I  am concerned that because of the amount of steroids that she has had she may  have some fluid retention.   PLAN:  The plan is to go ahead and give her some Lasix today.  I am going to  try to get her up and out of the bed.  I think that may help a bit anyway.  No other changes in medications or treatments today.      ___________________________________________                                            Oneal Deputy. Juanetta Gosling, M.D.   ELH/MEDQ  D:  11/03/2003  T:  11/03/2003  Job:  [id number]

## 2011-03-31 NOTE — Group Therapy Note (Signed)
NAMEAMBRIA, MAYFIELD              ACCOUNT NO.:  [id number]   MEDICAL RECORD NO.:  [id number]          PATIENT TYPE:  INP   LOCATION:  A228                          FACILITY:  [facility]   PHYSICIAN:  Edward L. Juanetta Gosling, M.D.DATE OF BIRTH:  09-03-1941   DATE OF PROCEDURE:  10/30/2006  DATE OF DISCHARGE:                                 PROGRESS NOTE   PROBLEMS:  1. Chronic obstructive pulmonary disease.  2. Mental illness.  3. Weakness.   SUBJECTIVE:  Ms. Sykora says she is about the same.  She is very weak,  however.  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  She is, however, still short of breath.  I do not  think she is ready to go home and I am concerned that she may end up  having to do another short-term nursing home stay but we will see what  happens.  Otherwise, we will continue with meds and treatment __________  .      Edward L. Juanetta Gosling, M.D.  Electronically Signed     ELH/MEDQ  D:  10/30/2006  T:  10/30/2006  Job:  [id number]

## 2011-03-31 NOTE — Group Therapy Note (Signed)
NAME:  April Pearson, April Pearson                        ACCOUNT NO.:  [id number]   MEDICAL RECORD NO.:  [id number]                   PATIENT TYPE:  INP   LOCATION:  A309                                 FACILITY:  [facility]   PHYSICIAN:  Edward L. Juanetta Gosling, M.D.             DATE OF BIRTH:  November 13, 1941   DATE OF PROCEDURE:  10/22/2003  DATE OF DISCHARGE:                                   PROGRESS NOTE   PROBLEMS:  1. Chronic obstructive pulmonary disease with exacerbation.  2. Anxiety and depression.   SUBJECTIVE:  Ms. Large says she is feeling a little better.  She is still  short of breath but not as bad as she was.   OBJECTIVE:  Her physical exam today shows that her chest is clearer than it  has been, her heart is regular, her abdomen soft.  She still really does not  have a lot of wheezes, rales, or rhonchi to exam but she does still look  somewhat uncomfortable.  Her CT scan done yesterday did not show any  evidence of pulmonary embolism.  Her vital signs today show blood pressure  132/86, pulse rate 87, respirations 20, temperature 98, O2 saturation 97% on  2 liters.   ASSESSMENT:  She seems a little better.  She is still having problems but I  do think she is a bit better.   PLAN:  Continue with her treatments and medications and follow.      ___________________________________________                                            Oneal Deputy Juanetta Gosling, M.D.   ELH/MEDQ  D:  10/22/2003  T:  10/22/2003  Job:  [id number]

## 2011-03-31 NOTE — Group Therapy Note (Signed)
NAME:  April Pearson, April Pearson                        ACCOUNT NO.:  [id number]   MEDICAL RECORD NO.:  [id number]                   PATIENT TYPE:  INP   LOCATION:  A310                                 FACILITY:  [facility]   PHYSICIAN:  Edward L. Juanetta Gosling, M.D.             DATE OF BIRTH:  Mar 04, 1941   DATE OF PROCEDURE:  11/05/2003  DATE OF DISCHARGE:                                   PROGRESS NOTE   PROBLEM:  Chronic obstructive pulmonary disease with exacerbation.   SUBJECTIVE:  April Pearson says she still does not feel well.  She is still  short of breath.   OBJECTIVE:  Her exam this morning shows that her blood pressure is 145/90,  heart rate is 114, respirations 24, temperature is 98, O2 saturations in the  90s on 2 liters.  Her chest shows rhonchi bilaterally and she is still  wheezing a little bit but it is mostly rhonchi now.   ASSESSMENT:  She is better but still not doing well.   PLAN:  Continue with her medications and treatments as before; no changes  today.  I am going to add theophylline to see if it makes any difference at  all.  The concern, of course, is that she is essentially end-stage and we  may not be able to make a great deal of difference, but I am hopeful that  she will improve.      ___________________________________________                                            Oneal Deputy. Juanetta Gosling, M.D.   ELH/MEDQ  D:  11/05/2003  T:  11/05/2003  Job:  [id number]

## 2011-03-31 NOTE — Group Therapy Note (Signed)
NAME:  April Pearson, April Pearson                        ACCOUNT NO.:  [id number]   MEDICAL RECORD NO.:  [id number]                   PATIENT TYPE:  INP   LOCATION:  A310                                 FACILITY:  [facility]   PHYSICIAN:  Edward L. Juanetta Gosling, M.D.             DATE OF BIRTH:  1941-10-23   DATE OF PROCEDURE:  DATE OF DISCHARGE:                                   PROGRESS NOTE   SUBJECTIVE:  April Pearson says she does not feel as well today as she did.  She  is still having a lot of congestion.  She is still wheezing as well.   OBJECTIVE:  CHEST:  Actually pretty clear.  HEART:  Regular.  ABDOMEN:  Soft.   ASSESSMENT:  She has chronic obstructive pulmonary disease with  exacerbation.  Her blood pressure today is well controlled at 120/80.  Her  heart is regular with a rate of about 80, so she has good control of her  heart rate.  Her anxiety is still quite severe.   PLAN:  Continue medications.  Continue treatments.  We will see if we can  get her to at least consider the possibility of going to a rest home because  I am concerned still that her COPD is severe and unlikely to improve. She  has already stopped smoking and I am not sure she is going to be able to  manage all of this at home.      ___________________________________________                                            Oneal Deputy. Juanetta Gosling, M.D.   ELH/MEDQ  D:  11/02/2003  T:  11/02/2003  Job:  [id number]

## 2011-03-31 NOTE — Discharge Summary (Signed)
NAME:  April Pearson, April Pearson                        ACCOUNT NO.:  [id number]   MEDICAL RECORD NO.:  [id number]                   PATIENT TYPE:  INP   LOCATION:  A309                                 FACILITY:  [facility]   PHYSICIAN:  Edward L. Juanetta Gosling, M.D.             DATE OF BIRTH:  [birth year]   DATE OF ADMISSION:  10/20/2003  DATE OF DISCHARGE:  10/23/2003                                 DISCHARGE SUMMARY   FINAL DISCHARGE DIAGNOSES:  1. Chronic obstructive pulmonary disease with exacerbation.  2. 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  3. Hypertension.  4. Chronic tachycardia.   HISTORY:  [name] is a 70 year old who has a long-known history of chronic  obstructive pulmonary disease.  She came to the emergency room with  increased shortness of breath.  She was noted at that time to appear  dyspneic and very anxious.  She was treated in the emergency room but did  not improve enough for discharge and was admitted to the hospital because of  that.   Her exam on admission showed blood pressure 120/80, pulse 80, her  respirations were 22.  CNS was grossly intact, but she looked very anxious.  She did have some wheezes bilaterally.   HOSPITAL COURSE:  She was admitted, started on IV fluids, IV antibiotics,  inhaled bronchodilators.  Her white blood count was 9400, hemoglobin 12.1.  The d-dimer was normal at 0.22.  Blood gas showed a pH of 7.44, pCO2 of 40,  and a pO2 of 65.  Electrolytes were normal.  Cardiac enzymes were normal.  BNP was normal.  She had a CT of the chest to rule out pulmonary embolism,  and this was negative.  She was treated and improved.  She continued to  complain of being short of breath, and her physical exam continued to be  generally showing some evidence of COPD, but her symptoms were out of  proportion to her exam.  Discussion was undertaken regarding the possibility  of her going to a rest home, etc., which she refused.  She was discharged  on  October 23, 2003, on:  1. Ceftin 250 mg b.i.d. x7 more days.  2. Prednisone 40 mg x3 days, 30 x3 days, 20 x3 days, 10 x3 days, and then     stop.  3. She is going to increase her Risperdal to 1 mg b.i.d.     ___________________________________________                                         Oneal Deputy. Juanetta Gosling, M.D.   ELH/MEDQ  D:  10/23/2003  T:  10/24/2003  Job:  [id number]

## 2011-03-31 NOTE — Group Therapy Note (Signed)
April Pearson, April Pearson              ACCOUNT NO.:  [id number]   MEDICAL RECORD NO.:  [id number]          PATIENT TYPE:  INP   LOCATION:  A319                          FACILITY:  [facility]   PHYSICIAN:  Edward L. Juanetta Gosling, M.D.DATE OF BIRTH:  1941/07/07   DATE OF PROCEDURE:  DATE OF DISCHARGE:                                 PROGRESS NOTE   April Pearson continues to be very unsteady and it is the opinion of  physical therapy that she is going to need placement at discharge.  She  is basically ready now for discharge, except for that, so we need to  start working on placement issues.  She is breathing fairly well.   PHYSICAL EXAMINATION:  Shows her temperature is 98, pulse 83,  respirations 17, blood pressure 133/74, O2 sat is 98% on 2 liters.  CHEST:  Relatively clear with decreased breath sounds.   ASSESSMENT:  She has got severe chronic obstructive pulmonary disease.  She has very poor ability to walk.   PLAN:  To try to get her set for nursing home placement.      Edward L. Juanetta Gosling, M.D.  Electronically Signed     ELH/MEDQ  D:  10/31/2006  T:  11/01/2006  Job:  [id number]

## 2011-03-31 NOTE — Group Therapy Note (Signed)
NAME:  LADEAN, STEINMEYER                        ACCOUNT NO.:  [id number]   MEDICAL RECORD NO.:  [id number]                   PATIENT TYPE:  INP   LOCATION:  A310                                 FACILITY:  [facility]   PHYSICIAN:  Edward L. Juanetta Gosling, M.D.             DATE OF BIRTH:  [birth year]   DATE OF PROCEDURE:  11/04/2003  DATE OF DISCHARGE:                                   PROGRESS NOTE   PROBLEMS:  1. Chronic obstructive pulmonary disease.  2. Hypertension.  3. Chronic tachycardia.  4. Mental illness.   SUBJECTIVE:  Ms. Seybold is better.  She says she is not having as much  shortness of breath although she is still short of breath.  I received a  call from her mental health worker yesterday suggesting that her Risperdal  needs to be increased and I have increased it to 2 mg b.i.d. yesterday.  I  plan to continue with all of the other treatments as before.  She is on  steroids, inhaled bronchodilators, antibiotics, etc.  There is little else  to add.  I still think she probably does not need to try to manage this at  home but I am not certain that she is going to agree to do anything else.  Will continue with her treatment as it is.  She is not ready for discharge  at this point.      ___________________________________________                                            Oneal Deputy. Juanetta Gosling, M.D.   ELH/MEDQ  D:  11/04/2003  T:  11/04/2003  Job:  [id number]

## 2011-08-07 ENCOUNTER — Other Ambulatory Visit (HOSPITAL_COMMUNITY): Payer: Self-pay | Admitting: Pulmonary Disease

## 2011-08-07 DIAGNOSIS — Z139 Encounter for screening, unspecified: Secondary | ICD-10-CM

## 2011-08-18 ENCOUNTER — Ambulatory Visit (HOSPITAL_COMMUNITY)
Admission: RE | Admit: 2011-08-18 | Discharge: 2011-08-18 | Disposition: A | Discharge status: Home / Self Care | Payer: Medicare Other | Source: Ambulatory Visit | Attending: Pulmonary Disease | Admitting: Pulmonary Disease

## 2011-08-18 DIAGNOSIS — Z139 Encounter for screening, unspecified: Secondary | ICD-10-CM

## 2011-08-18 DIAGNOSIS — Z1231 Encounter for screening mammogram for malignant neoplasm of breast: Secondary | ICD-10-CM | POA: Insufficient documentation

## 2012-01-18 ENCOUNTER — Ambulatory Visit (HOSPITAL_COMMUNITY)
Admission: RE | Admit: 2012-01-18 | Discharge: 2012-01-18 | Disposition: A | Discharge status: Home / Self Care | Payer: Medicare Other | Source: Ambulatory Visit | Attending: Internal Medicine | Admitting: Internal Medicine

## 2012-01-18 ENCOUNTER — Other Ambulatory Visit (HOSPITAL_COMMUNITY): Payer: Self-pay | Admitting: Internal Medicine

## 2012-01-18 DIAGNOSIS — R05 Cough: Secondary | ICD-10-CM

## 2012-01-18 DIAGNOSIS — R0602 Shortness of breath: Secondary | ICD-10-CM

## 2012-01-19 ENCOUNTER — Emergency Department (HOSPITAL_COMMUNITY): Payer: Medicare Other

## 2012-01-19 ENCOUNTER — Encounter (HOSPITAL_COMMUNITY): Payer: Self-pay

## 2012-01-19 ENCOUNTER — Inpatient Hospital Stay (HOSPITAL_COMMUNITY)
Admission: EM | Admit: 2012-01-19 | Discharge: 2012-01-26 | DRG: 190 | Disposition: A | Discharge status: Home Health | Payer: Medicare Other | Attending: Internal Medicine | Admitting: Internal Medicine

## 2012-01-19 ENCOUNTER — Other Ambulatory Visit: Payer: Self-pay

## 2012-01-19 DIAGNOSIS — F319 Bipolar disorder, unspecified: Secondary | ICD-10-CM | POA: Diagnosis present

## 2012-01-19 DIAGNOSIS — M810 Age-related osteoporosis without current pathological fracture: Secondary | ICD-10-CM | POA: Diagnosis present

## 2012-01-19 DIAGNOSIS — Z79899 Other long term (current) drug therapy: Secondary | ICD-10-CM

## 2012-01-19 DIAGNOSIS — I498 Other specified cardiac arrhythmias: Secondary | ICD-10-CM | POA: Diagnosis present

## 2012-01-19 DIAGNOSIS — Z8551 Personal history of malignant neoplasm of bladder: Secondary | ICD-10-CM

## 2012-01-19 DIAGNOSIS — I10 Essential (primary) hypertension: Secondary | ICD-10-CM | POA: Diagnosis present

## 2012-01-19 DIAGNOSIS — J449 Chronic obstructive pulmonary disease, unspecified: Secondary | ICD-10-CM

## 2012-01-19 DIAGNOSIS — E8779 Other fluid overload: Secondary | ICD-10-CM | POA: Diagnosis not present

## 2012-01-19 DIAGNOSIS — Z87891 Personal history of nicotine dependence: Secondary | ICD-10-CM

## 2012-01-19 DIAGNOSIS — Z7982 Long term (current) use of aspirin: Secondary | ICD-10-CM

## 2012-01-19 DIAGNOSIS — J189 Pneumonia, unspecified organism: Secondary | ICD-10-CM | POA: Diagnosis present

## 2012-01-19 DIAGNOSIS — F341 Dysthymic disorder: Secondary | ICD-10-CM | POA: Diagnosis present

## 2012-01-19 DIAGNOSIS — R000 Tachycardia, unspecified: Secondary | ICD-10-CM | POA: Diagnosis present

## 2012-01-19 DIAGNOSIS — J441 Chronic obstructive pulmonary disease with (acute) exacerbation: Principal | ICD-10-CM | POA: Diagnosis present

## 2012-01-19 HISTORY — DX: Chronic obstructive pulmonary disease, unspecified: J44.9

## 2012-01-19 HISTORY — DX: Depression, unspecified: F32.A

## 2012-01-19 HISTORY — DX: Tachycardia, unspecified: R00.0

## 2012-01-19 HISTORY — DX: Anxiety disorder, unspecified: F41.9

## 2012-01-19 HISTORY — DX: Major depressive disorder, single episode, unspecified: F32.9

## 2012-01-19 HISTORY — DX: Malignant (primary) neoplasm, unspecified: C80.1

## 2012-01-19 HISTORY — DX: Weakness: R53.1

## 2012-01-19 LAB — URINALYSIS, ROUTINE W REFLEX MICROSCOPIC
Bilirubin Urine: NEGATIVE
Glucose, UA: NEGATIVE mg/dL
Ketones, ur: NEGATIVE mg/dL
Leukocytes, UA: NEGATIVE
pH: 6 (ref 5.0–8.0)

## 2012-01-19 LAB — CBC
HCT: 33.1 % — ABNORMAL LOW (ref 36.0–46.0)
Hemoglobin: 10.6 g/dL — ABNORMAL LOW (ref 12.0–15.0)
MCV: 93.8 fL (ref 78.0–100.0)
RBC: 3.53 MIL/uL — ABNORMAL LOW (ref 3.87–5.11)
WBC: 7.4 10*3/uL (ref 4.0–10.5)

## 2012-01-19 LAB — BASIC METABOLIC PANEL
BUN: 13 mg/dL (ref 6–23)
CO2: 38 mEq/L — ABNORMAL HIGH (ref 19–32)
Chloride: 97 mEq/L (ref 96–112)
Creatinine, Ser: 0.65 mg/dL (ref 0.50–1.10)

## 2012-01-19 MED ORDER — FERROUS SULFATE 325 (65 FE) MG PO TABS
325.0000 mg | ORAL_TABLET | Freq: Every day | ORAL | Status: DC
  Administered 2012-01-20 – 2012-01-26 (×7): 325 mg via ORAL
  Filled 2012-01-19 (×7): qty 1

## 2012-01-19 MED ORDER — FLUTICASONE-SALMETEROL 250-50 MCG/DOSE IN AEPB
1.0000 | INHALATION_SPRAY | Freq: Two times a day (BID) | RESPIRATORY_TRACT | Status: DC
  Administered 2012-01-19 – 2012-01-26 (×14): 1 via RESPIRATORY_TRACT
  Filled 2012-01-19: qty 14

## 2012-01-19 MED ORDER — ASPIRIN EC 81 MG PO TBEC
81.0000 mg | DELAYED_RELEASE_TABLET | Freq: Every day | ORAL | Status: DC
  Administered 2012-01-20 – 2012-01-26 (×7): 81 mg via ORAL
  Filled 2012-01-19 (×6): qty 1

## 2012-01-19 MED ORDER — FLUOXETINE HCL 20 MG PO CAPS
20.0000 mg | ORAL_CAPSULE | Freq: Every day | ORAL | Status: DC
  Administered 2012-01-20 – 2012-01-26 (×7): 20 mg via ORAL
  Filled 2012-01-19 (×7): qty 1

## 2012-01-19 MED ORDER — ALBUTEROL SULFATE (5 MG/ML) 0.5% IN NEBU
2.5000 mg | INHALATION_SOLUTION | Freq: Once | RESPIRATORY_TRACT | Status: DC
  Filled 2012-01-19: qty 0.5

## 2012-01-19 MED ORDER — SODIUM CHLORIDE 0.9 % IV SOLN
INTRAVENOUS | Status: DC
  Administered 2012-01-19: 17:00:00 via INTRAVENOUS

## 2012-01-19 MED ORDER — METHYLPREDNISOLONE SODIUM SUCC 125 MG IJ SOLR
125.0000 mg | Freq: Once | INTRAMUSCULAR | Status: AC
  Administered 2012-01-19: 125 mg via INTRAVENOUS

## 2012-01-19 MED ORDER — IPRATROPIUM BROMIDE 0.02 % IN SOLN
0.5000 mg | Freq: Once | RESPIRATORY_TRACT | Status: AC
  Administered 2012-01-19: 0.5 mg via RESPIRATORY_TRACT
  Filled 2012-01-19: qty 2.5

## 2012-01-19 MED ORDER — ADULT MULTIVITAMIN W/MINERALS CH
1.0000 | ORAL_TABLET | Freq: Every day | ORAL | Status: DC
  Administered 2012-01-20 – 2012-01-26 (×7): 1 via ORAL
  Filled 2012-01-19 (×7): qty 1

## 2012-01-19 MED ORDER — DILTIAZEM HCL ER COATED BEADS 240 MG PO CP24
240.0000 mg | ORAL_CAPSULE | Freq: Every day | ORAL | Status: DC
  Administered 2012-01-20 – 2012-01-26 (×7): 240 mg via ORAL
  Filled 2012-01-19 (×11): qty 1

## 2012-01-19 MED ORDER — RISPERIDONE 0.5 MG PO TABS
0.5000 mg | ORAL_TABLET | Freq: Every day | ORAL | Status: DC
  Administered 2012-01-19 – 2012-01-25 (×7): 0.5 mg via ORAL
  Filled 2012-01-19 (×7): qty 1

## 2012-01-19 MED ORDER — ALPRAZOLAM 1 MG PO TABS
1.0000 mg | ORAL_TABLET | Freq: Every day | ORAL | Status: DC
  Administered 2012-01-19 – 2012-01-25 (×7): 1 mg via ORAL
  Filled 2012-01-19 (×7): qty 1

## 2012-01-19 MED ORDER — ALBUTEROL SULFATE (5 MG/ML) 0.5% IN NEBU
2.5000 mg | INHALATION_SOLUTION | Freq: Once | RESPIRATORY_TRACT | Status: AC
  Administered 2012-01-19: 2.5 mg via RESPIRATORY_TRACT
  Filled 2012-01-19: qty 0.5

## 2012-01-19 MED ORDER — ONDANSETRON HCL 4 MG/2ML IJ SOLN
4.0000 mg | Freq: Once | INTRAMUSCULAR | Status: AC
  Administered 2012-01-19: 4 mg via INTRAVENOUS
  Filled 2012-01-19: qty 2

## 2012-01-19 MED ORDER — IPRATROPIUM BROMIDE 0.02 % IN SOLN
RESPIRATORY_TRACT | Status: AC
  Administered 2012-01-19: 0.5 mg
  Filled 2012-01-19: qty 2.5

## 2012-01-19 MED ORDER — IPRATROPIUM BROMIDE 0.02 % IN SOLN
0.5000 mg | RESPIRATORY_TRACT | Status: AC
  Administered 2012-01-19: 0.5 mg via RESPIRATORY_TRACT
  Filled 2012-01-19: qty 2.5

## 2012-01-19 MED ORDER — FUROSEMIDE 40 MG PO TABS
40.0000 mg | ORAL_TABLET | Freq: Every day | ORAL | Status: DC
  Administered 2012-01-20 – 2012-01-26 (×7): 40 mg via ORAL
  Filled 2012-01-19 (×7): qty 1

## 2012-01-19 MED ORDER — SODIUM CHLORIDE 0.9 % IV SOLN
INTRAVENOUS | Status: DC
  Administered 2012-01-19: 50 mL/h via INTRAVENOUS
  Administered 2012-01-21 – 2012-01-25 (×5): via INTRAVENOUS

## 2012-01-19 MED ORDER — ALENDRONATE SODIUM 70 MG PO TABS
70.0000 mg | ORAL_TABLET | ORAL | Status: DC
  Filled 2012-01-19: qty 1

## 2012-01-19 MED ORDER — ALBUTEROL SULFATE (5 MG/ML) 0.5% IN NEBU
INHALATION_SOLUTION | RESPIRATORY_TRACT | Status: AC
  Administered 2012-01-19: 5 mg
  Filled 2012-01-19: qty 1

## 2012-01-19 NOTE — ED Notes (Signed)
When entered room patient was receiving treatment from RT. Vitals were taken. Patient was placed on the bedpan when RT left.

## 2012-01-19 NOTE — ED Provider Notes (Signed)
History   This chart was scribed for Shelda Jakes, MD by Sofie Rower. The patient was seen in room APA01/APA01 and the patient's care was started at 12:10PM.    CSN: [id number]  Arrival date & time 01/19/12  1021   First MD Initiated Contact with Patient 01/19/12 1051      Chief Complaint  Patient presents with  . Shortness of Breath    (Consider location/radiation/quality/duration/timing/severity/associated sxs/prior treatment) HPI  April Pearson is a 71 y.o. female who presents to the Emergency Department complaining of moderate, constant shortness of breath onset seven days with associated symptoms of congestion, nausea, headache. Pt usually uses oxygen, 2 liters at home, [place], for the past 5 years. Pt had a chest x-ray taken yesterday, where which she was informed it was clear but she is still having trouble breathing today. Pt is a resident at [facility] assisted living home. Pt takes cepfuroxine. Pt has a hx of COPD.  Pt denies fever, dysuria, back pain, neck pain.   PCP is Dr. Juanetta Gosling.   Past Medical History  Diagnosis Date  . COPD (chronic obstructive pulmonary disease)   . Hypertension   . Osteoporosis   . Anxiety   . Depression   . Cancer     bladder  . Weakness   . Tachycardia     Past Surgical History  Procedure Date  . Bladder surgery   . Breast surgery     No family history on file.  History  Substance Use Topics  . Smoking status: Former Games developer  . Smokeless tobacco: Not on file  . Alcohol Use: No    OB History    Grav Para Term Preterm Abortions TAB SAB Ect Mult Living                  Review of Systems  All other systems reviewed and are negative.    10 Systems reviewed and are negative for acute change except as noted in the HPI.   Allergies  Review of patient's allergies indicates no known allergies.  Home Medications   Current Outpatient Rx  Name Route Sig Dispense Refill  . ALENDRONATE SODIUM 70 MG PO TABS Oral Take 70  mg by mouth every 7 (seven) days. Take with a full glass of water on an empty stomach.patient take on Monday    . ALPRAZOLAM 1 MG PO TABS Oral Take 1 mg by mouth at bedtime.    . ASPIRIN EC 81 MG PO TBEC Oral Take 81 mg by mouth daily.    Marland Kitchen CEFUROXIME AXETIL 250 MG PO TABS Oral Take 250 mg by mouth 2 (two) times daily. For 7 days patient started on 01/15/12    . DILTIAZEM HCL ER BEADS 240 MG PO CP24 Oral Take 240 mg by mouth daily.    Marland Kitchen FERROUS SULFATE 325 (65 FE) MG PO TABS Oral Take 325 mg by mouth daily.    Marland Kitchen FLUOXETINE HCL 20 MG PO TABS Oral Take 20 mg by mouth daily.    Marland Kitchen FLUTICASONE PROPIONATE 50 MCG/ACT NA SUSP Nasal Place 2 sprays into the nose daily.    Marland Kitchen FLUTICASONE-SALMETEROL 250-50 MCG/DOSE IN AEPB Inhalation Inhale 1 puff into the lungs every 12 (twelve) hours.    . FUROSEMIDE 40 MG PO TABS Oral Take 40 mg by mouth daily.    Marland Kitchen HCA SUPER THERAVITE-M PO Oral Take 1 tablet by mouth daily.    Marland Kitchen RISPERIDONE 0.5 MG PO TABS Oral Take 0.5 mg  by mouth at bedtime.      BP 156/64  Pulse 97  Temp(Src) 98.5 F (36.9 C) (Oral)  Ht 5\' 6"  (1.676 m)  Wt 160 lb (72.576 kg)  BMI 25.82 kg/m2  SpO2 96%  Physical Exam  Nursing note and vitals reviewed. Constitutional: She is oriented to person, place, and time. She appears well-developed and well-nourished.  HENT:  Head: Normocephalic and atraumatic.  Nose: Nose normal.  Mouth/Throat: Oropharynx is clear and moist.  Eyes: Conjunctivae and EOM are normal. Pupils are equal, round, and reactive to light. No scleral icterus.  Neck: Normal range of motion. Neck supple. No thyromegaly present.  Cardiovascular: Normal rate, regular rhythm and normal heart sounds.  Exam reveals no gallop and no friction rub.   No murmur heard. Pulmonary/Chest: Effort normal. No stridor. She has wheezes (Wheezing on the left side. ). She has no rales. She exhibits no tenderness.  Abdominal: Soft. Bowel sounds are normal. She exhibits no distension. There is no  tenderness. There is no rebound.  Musculoskeletal: Normal range of motion. She exhibits no edema.  Lymphadenopathy:    She has no cervical adenopathy.  Neurological: She is alert and oriented to person, place, and time. Coordination normal.  Skin: Skin is warm and dry. No rash noted. No erythema.  Psychiatric: She has a normal mood and affect. Her behavior is normal.    ED Course  Procedures (including critical care time)  DIAGNOSTIC STUDIES: Oxygen Saturation is 96% on [place], adequate by my interpretation.    COORDINATION OF CARE:     Results for orders placed during the hospital encounter of 01/19/12  CBC      Component Value Range   WBC 7.4  4.0 - 10.5 (K/uL)   RBC 3.53 (*) 3.87 - 5.11 (MIL/uL)   Hemoglobin 10.6 (*) 12.0 - 15.0 (g/dL)   HCT 16.1 (*) 09.6 - 46.0 (%)   MCV 93.8  78.0 - 100.0 (fL)   MCH 30.0  26.0 - 34.0 (pg)   MCHC 32.0  30.0 - 36.0 (g/dL)   RDW 04.5  40.9 - 81.1 (%)   Platelets 253  150 - 400 (K/uL)  BASIC METABOLIC PANEL      Component Value Range   Sodium 142  135 - 145 (mEq/L)   Potassium 3.6  3.5 - 5.1 (mEq/L)   Chloride 97  96 - 112 (mEq/L)   CO2 38 (*) 19 - 32 (mEq/L)   Glucose, Bld 117 (*) 70 - 99 (mg/dL)   BUN 13  6 - 23 (mg/dL)   Creatinine, Ser 9.14  0.50 - 1.10 (mg/dL)   Calcium 9.3  8.4 - 78.2 (mg/dL)   GFR calc non Af Amer 88 (*) >90 (mL/min)   GFR calc Af Amer >90  >90 (mL/min)  URINALYSIS, ROUTINE W REFLEX MICROSCOPIC      Component Value Range   Color, Urine YELLOW  YELLOW    APPearance CLEAR  CLEAR    Specific Gravity, Urine 1.010  1.005 - 1.030    pH 6.0  5.0 - 8.0    Glucose, UA NEGATIVE  NEGATIVE (mg/dL)   Hgb urine dipstick NEGATIVE  NEGATIVE    Bilirubin Urine NEGATIVE  NEGATIVE    Ketones, ur NEGATIVE  NEGATIVE (mg/dL)   Protein, ur NEGATIVE  NEGATIVE (mg/dL)   Urobilinogen, UA 0.2  0.0 - 1.0 (mg/dL)   Nitrite NEGATIVE  NEGATIVE    Leukocytes, UA NEGATIVE  NEGATIVE      Dg Chest Portable 1  View  01/19/2012  *RADIOLOGY  REPORT*  Clinical Data: Shortness of breath.  Emphysema.  History of bladder cancer.  PORTABLE CHEST - 1 VIEW  Comparison: CT chest 10/28/2006.  Plain films of the chest 01/18/2012.  Findings: Lungs are emphysematous but clear.  Heart size is normal. No pneumothorax or effusion.  Remote right seventh rib fracture noted.  IMPRESSION: Emphysema without acute disease.  Original Report Authenticated By: Bernadene Bell. Maricela Curet, M.D.     Date: 01/19/2012  Rate: 104  Rhythm: sinus tachycardia  QRS Axis: normal  Intervals: normal  ST/T Wave abnormalities: nonspecific ST changes  Conduction Disutrbances:none  Narrative Interpretation:   Old EKG Reviewed: unchanged Short PR interval noted on today's EKG     1. COPD (chronic obstructive pulmonary disease)     12:17PMAM- EDP at bedside discusses treatment plan.   MDM   Patient with known COPD today's chest x-ray without evidence of pneumonia also no leukocytosis patient's electrolytes without significant abnormalities of an elevation of CO2 consistent with chronic COPD. Patient presented wheezing received albuterol Atrovent nebulizers without improvement in the wheezing also receiving Solu-Medrol IV 125 mg will require admission for exacerbation of COPD patient normally on 2 L of oxygen. Sats on 2 L range from 88-92 sometimes a little higher on 3 L of oxygen she shows improvement. Patient is nontoxic in no acute distress.  Patient is followed by Dr. Juanetta Gosling currently admissions for Dr. Juanetta Gosling are being done by Dr. Felecia Shelling he will admit the patient to the floor.      I personally performed the services described in this documentation, which was scribed in my presence. The recorded information has been reviewed and considered.     Shelda Jakes, MD 01/19/12 [phone]

## 2012-01-19 NOTE — Progress Notes (Signed)
Patient sister took her portable oxygen home with her, I told her she would need to bring it back when the patient is d/c home. Monty Mccarrell [employer]

## 2012-01-19 NOTE — ED Notes (Signed)
Pt a/o x3. Reports being "short of breath more than normal for a week. I had a chest xray and they said it was clear but im still having problems breathing"

## 2012-01-19 NOTE — ED Notes (Signed)
EDP to re eval pt for possible admission

## 2012-01-20 MED ORDER — DEXTROSE 5 % IV SOLN
1.0000 g | INTRAVENOUS | Status: DC
  Administered 2012-01-20 – 2012-01-26 (×7): 1 g via INTRAVENOUS
  Filled 2012-01-20 (×10): qty 10

## 2012-01-20 MED ORDER — DEXTROSE 5 % IV SOLN
500.0000 mg | INTRAVENOUS | Status: DC
  Administered 2012-01-20 – 2012-01-26 (×7): 500 mg via INTRAVENOUS
  Filled 2012-01-20 (×10): qty 500

## 2012-01-20 MED ORDER — ALBUTEROL SULFATE (5 MG/ML) 0.5% IN NEBU
2.5000 mg | INHALATION_SOLUTION | RESPIRATORY_TRACT | Status: DC | PRN
  Administered 2012-01-20 – 2012-01-21 (×3): 2.5 mg via RESPIRATORY_TRACT
  Filled 2012-01-20 (×3): qty 0.5

## 2012-01-20 NOTE — H&P (Signed)
April Pearson, HEARLD              ACCOUNT NO.:  [id number]  MEDICAL RECORD NO.:  [id number]  LOCATION:  A307                          FACILITY:  [facility]  PHYSICIAN:  Talon Witting D. Felecia Shelling, MD   DATE OF BIRTH:  16-Sep-1941  DATE OF ADMISSION:  01/19/2012 DATE OF DISCHARGE:  LH                             HISTORY & PHYSICAL   CHIEF COMPLAINT:  Cough, wheezing, and shortness of breath.  HISTORY OF PRESENT ILLNESS:  [name] came to emergency room with above complaint.  The patient has a history of COPD.  She is a former smoker.  She has been taking her nebulizer treatment and her regular medications.  However, her symptoms got worse.  She was brought to emergency room where she was evaluated and was treated with nebulizer treatments.  However, her symptoms continued to get worse.  She was then started on IV steroid and admitted for further treatment.  PAST MEDICAL HISTORY: 1. Chronic obstructive pulmonary disease. 2. Hypertension. 3. Anxiety depression disorder. 4. History of osteoporosis. 5. History of tachyarrhythmia.  CURRENT HOME MEDICATIONS: 1. Flonase nasal spray 2 puffs b.i.d. 2. Fosamax 70 mg daily. 3. Xanax 1 mg at bedtime. 4. Aspirin 1 mg daily. 5. Diltiazem 240 mg daily. 6. Ferrous sulfate 325 mg daily. 7. Prozac 20 mg daily. 8. Advair 250/50, 1 tablet daily. 9. Lasix 40 mg daily. 10.Multivitamin 1 tablet daily. 11.Risperdal 0.5 mg daily.  SOCIAL HISTORY:  The patient stopped smoking about 5 years back.  No history of alcohol or substance abuse.  FAMILY HISTORY:  The patient unable to give any known illness in the family.  PHYSICAL EXAMINATION:  GENERAL:  The patient is awake but acutely sick looking. VITAL SIGNS:  Blood pressure 115/60, pulse 73, respiratory rate 24, temperature 98.6 degrees Fahrenheit. HEENT:  Pupils are equal, reactive. NECK:  Supple. CHEST:  Poor air entry, bilateral rhonchi and expiratory  wheezes. CARDIOVASCULAR SYSTEM:  First and second heart sound heard.  No murmur. No gallop. ABDOMEN:  Soft and lax.  Bowel sound is positive.  No mass or organomegaly.  EXTREMITIES:  No leg edema.  Chest x-ray shows emphysematous changes.  LABS ON ADMISSION:  CBC:  WBC 7.4, hemoglobin 10.6, hematocrit 33.1, and platelets 253,000.  BMP:  Sodium 142, potassium 3.6, chloride 97, calcium 9.3, carbon dioxide 38, and glucose of 117.  ASSESSMENT: 1. Acute exacerbation of chronic obstructive pulmonary disease. 2. To rule out superimposed pneumonia. 3. Hypertension. 4. Osteoporosis. 5. Anxiety depression disorder.  PLAN:  We will continue the patient on nebulizer treatment.  Continue oxygen therapy.  Continue on IV steroid.  We will start her on IV antibiotics.     Delmore Sear D. Felecia Shelling, MD     TDF/MEDQ  D:  01/20/2012  T:  01/20/2012  Job:  [id number]

## 2012-01-20 NOTE — Progress Notes (Signed)
NAMECHANCEY, CULLINANE              ACCOUNT NO.:  [id number]  MEDICAL RECORD NO.:  [id number]  LOCATION:  A307                          FACILITY:  [facility]  PHYSICIAN:  Jeremias Broyhill D. Felecia Shelling, MD   DATE OF BIRTH:  [birth year]  DATE OF PROCEDURE:  01/20/2012 DATE OF DISCHARGE:                                PROGRESS NOTE   SUBJECTIVE:  The patient was admitted last night due to cough and shortness of breath.  She is a known case of chronic obstructive pulmonary disease.  Continued to have cough and shortness of breath.  No fever or chills.  OBJECTIVE:  GENERAL:  The patient is alert, awake, and sick looking. VITAL SIGNS:  Blood pressure 115/60, pulse 73, respiratory rate 24, temperature 98 degrees Fahrenheit. CHEST:  Poor air entry, bilateral expiratory wheezes and rhonchi. CARDIOVASCULAR SYSTEM:  First and second heart sounds heard.  No murmur. No gallop. ABDOMEN:  Soft, and lax.  Bowel sound is positive.  No mass or organomegaly. EXTREMITIES:  No on leg edema.  ASSESSMENT: 1. Acute exacerbation of chronic obstructive pulmonary disease. 2. History of hypertension. 3. History of osteoporosis. 4. Anxiety/depression disorder.  PLAN:  We will continue the patient on nebulizer treatment.  Continue the patient on IV steroid.  We will start her on IV antibiotics and continue her regular medications.     Ayani Ospina D. Felecia Shelling, MD     TDF/MEDQ  D:  01/20/2012  T:  01/20/2012  Job:  [id number]

## 2012-01-21 MED ORDER — SODIUM CHLORIDE 0.9 % IJ SOLN
INTRAMUSCULAR | Status: AC
  Filled 2012-01-21: qty 3

## 2012-01-21 MED ORDER — ALBUTEROL SULFATE (5 MG/ML) 0.5% IN NEBU
2.5000 mg | INHALATION_SOLUTION | Freq: Four times a day (QID) | RESPIRATORY_TRACT | Status: DC
  Administered 2012-01-21 – 2012-01-26 (×21): 2.5 mg via RESPIRATORY_TRACT
  Filled 2012-01-21 (×21): qty 0.5

## 2012-01-21 NOTE — Progress Notes (Signed)
Called into patient's room by tech because patient had become short of breath after ambulating to bathroom and back. Oxygen stats went into 80%. Patient sat up and respiratory called and put 30% face mask and oxygen stats went to 90%. Patient informed us that she uses 4L [place] when ambulating at home and 2L [place] when sitting around. When patient ambulates the plan is to use the 4L. Will continue to monitor.

## 2012-01-21 NOTE — Progress Notes (Signed)
Patient complains of not being able to breath. Oxygen mask increased to 35% by respiratory and oxygen stats went up to 92%.

## 2012-01-21 NOTE — Progress Notes (Signed)
April Pearson, MCGHIE              ACCOUNT NO.:  [id number]  MEDICAL RECORD NO.:  [id number]  LOCATION:  A307                          FACILITY:  [facility]  PHYSICIAN:  Perris Tripathi D. Felecia Shelling, MD   DATE OF BIRTH:  08-06-1941  DATE OF PROCEDURE:  01/21/2012 DATE OF DISCHARGE:                                PROGRESS NOTE   SUBJECTIVE:  The patient feels better today.  Her breathing is improving.  No fever or chills.  OBJECTIVE:  GENERAL/VITAL SIGNS:  Patient is alert, awake, and resting with vitals blood pressure 127/77, pulse 100, respiratory rate 24, temperature 98 degrees Fahrenheit.  CHEST:  Decreased air entry, few rhonchi.  CARDIOVASCULAR SYSTEM:  First and second heart sounds heard.  No murmur, no gallop.  ABDOMEN:  Soft and lax.  Bowel sound is positive.  No mass or organomegaly.  EXTREMITIES:  No leg edema.  ASSESSMENT: 1. Acute exacerbation of chronic obstructive pulmonary disease. 2. Hypertension. 3. Osteoporosis. 4. Anxiety depression disorder.  PLAN:  Continue the patient on IV steroid, IV antibiotics, and current treatment.     Camella Seim D. Felecia Shelling, MD     TDF/MEDQ  D:  01/21/2012  T:  01/21/2012  Job:  [id number]

## 2012-01-22 MED ORDER — METHYLPREDNISOLONE SODIUM SUCC 125 MG IJ SOLR
125.0000 mg | Freq: Four times a day (QID) | INTRAMUSCULAR | Status: DC
  Administered 2012-01-22 – 2012-01-26 (×15): 125 mg via INTRAVENOUS
  Filled 2012-01-22 (×15): qty 2

## 2012-01-22 MED ORDER — IPRATROPIUM BROMIDE 0.02 % IN SOLN
0.5000 mg | Freq: Four times a day (QID) | RESPIRATORY_TRACT | Status: DC
  Administered 2012-01-22 – 2012-01-26 (×17): 0.5 mg via RESPIRATORY_TRACT
  Filled 2012-01-22 (×16): qty 2.5

## 2012-01-22 MED ORDER — ALBUTEROL SULFATE (5 MG/ML) 0.5% IN NEBU
2.5000 mg | INHALATION_SOLUTION | RESPIRATORY_TRACT | Status: DC | PRN
  Administered 2012-01-22: 2.5 mg via RESPIRATORY_TRACT
  Filled 2012-01-22: qty 0.5

## 2012-01-22 NOTE — Progress Notes (Signed)
   CARE MANAGEMENT NOTE 01/22/2012  Patient:  April Pearson, April Pearson   Account Number:  [id number]  Date Initiated:  01/22/2012  Documentation initiated by:  Sharrie Rothman  Subjective/Objective Assessment:   Pt admitted from [facility]. Pt does have O2 from [place]. May need Home Health at discharge.     Action/Plan:   CM spoke with pt and will continue to follow needs for possible home health at discharge.   Anticipated DC Date:  01/26/2012   Anticipated DC Plan:  ASSISTED LIVING / REST HOME  In-house referral  Clinical Social Worker      DC Planning Services  CM consult      Choice offered to / List presented to:             Status of service:  In process, will continue to follow Medicare Important Message given?   (If response is "NO", the following Medicare IM given date fields will be blank) Date Medicare IM given:   Date Additional Medicare IM given:    Discharge Disposition:  REST HOME  Per UR Regulation:    Comments:  01/22/2012 Arlyss Queen, RN BSN Case Manager [phone] Spoke with patient about discharge plans. Pt from [facility] and wants to return at discharge if appropriate. Pt has O2 from [place]. CSW referral made. Will continue to monitor for discharge needs.

## 2012-01-22 NOTE — Clinical Documentation Improvement (Signed)
RESPIRATORY FAILURE DOCUMENTATION CLARIFICATION QUERY   THIS DOCUMENT IS NOT A PERMANENT PART OF THE MEDICAL RECORD  TO RESPOND TO THE THIS QUERY, FOLLOW THE INSTRUCTIONS BELOW:  1. If needed, update documentation for the patient's encounter via the notes activity.  2. Access this query again and click edit on the In [employer].  3. After updating, or not, click F2 to complete all highlighted (required) fields concerning your review. Select "additional documentation in the medical record" OR "no additional documentation provided".  4. Click Sign note button.  5. The deficiency will fall out of your In Basket *Please let us know if you are not able to complete this workflow by phone or e-mail (listed below).  Please update your documentation within the medical record to reflect your response to this query.                                                                                     01/22/12  Dear Dr. [facility],  In a better effort to capture your patient's severity of illness, reflect appropriate length of stay and utilization of resources, a review of the patient medical record has revealed the following indicators.    Based on your clinical judgment, please clarify and document in a progress note and/or discharge summary the clinical condition associated with the following supporting information:  In responding to this query please exercise your independent judgment.  The fact that a query is asked, does not imply that any particular answer is desired or expected.  Possible Clinical Conditions  _______Acute on Chronic Respiratory Failure  _______Chronic Respiratory Failure  _______Other Condition  _______Cannot Clinically Determine    Supporting Information:  Risk Factors: Acute exacerbation of COPD On 2-4lpm Home O2  Signs&Symptoms: "Wheezing" "Cough" Increased shob-onset 7 days prior to admission Mild tachypnea-RR 24 Drop in oxygen  saturations at 80% on 3/10   Radiology: Chest 1 view on 01/19/12 Findings: Lungs are emphysematous but clear. Heart size is normal.  No pneumothorax or effusion. Remote right seventh rib fracture  noted.  IMPRESSION:  Emphysema without acute disease  Treatment: Currently on 35% Ventri Mask; at home uses 2-4lpm/[place] Advair twice a day Atrovent Neb 4 times a day Albuterol 4 times a day and Q2 prn   You may use possible, probable, or suspect with inpatient documentation. possible, probable, suspected diagnoses MUST be documented at the time of discharge  Reviewed: Not Answered  Thank You,    Rossie Muskrat RN, BSN  Clinical Documentation Specialist Pager:  [phone]  [email or web address]  Health Information Management [place]

## 2012-01-23 MED ORDER — GUAIFENESIN ER 600 MG PO TB12
1200.0000 mg | ORAL_TABLET | Freq: Two times a day (BID) | ORAL | Status: DC
  Administered 2012-01-23 – 2012-01-26 (×7): 1200 mg via ORAL
  Filled 2012-01-23: qty 2
  Filled 2012-01-23: qty 1
  Filled 2012-01-23: qty 2
  Filled 2012-01-23: qty 1
  Filled 2012-01-23: qty 2
  Filled 2012-01-23: qty 1
  Filled 2012-01-23 (×3): qty 2

## 2012-01-23 NOTE — Progress Notes (Signed)
NAMEARRABELLA, WESTERMAN              ACCOUNT NO.:  [id number]  MEDICAL RECORD NO.:  [id number]  LOCATION:  A307                          FACILITY:  [facility]  PHYSICIAN:  Megumi Treaster L. Juanetta Gosling, M.D.DATE OF BIRTH:  [birth year]  DATE OF PROCEDURE: DATE OF DISCHARGE:                                PROGRESS NOTE   Ms. Requejo was admitted with COPD exacerbation.  She is somewhat better, but still having difficulty with shortness of breath.  PHYSICAL EXAMINATION:  GENERAL:  Shows that she is awake and alert. CHEST:  Relatively clear with decreased breath sounds. HEART:  Regular without gallop. ABDOMEN:  Soft. EXTREMITIES:  Showed no edema. CENTRAL NERVOUS SYSTEM:  Grossly intact.  Assessment then is that she has chronic obstructive pulmonary disease exacerbation.  She is still having to use mask oxygen, but has improved.  My plan is to add steroids to her treatment, continue with everything else.  No other new changes today.     Damarko Stitely L. Juanetta Gosling, M.D.     ELH/MEDQ  D:  01/22/2012  T:  01/23/2012  Job:  [id number]

## 2012-01-24 MED ORDER — SODIUM CHLORIDE 0.9 % IJ SOLN
INTRAMUSCULAR | Status: AC
  Administered 2012-01-24: 3 mL
  Filled 2012-01-24: qty 3

## 2012-01-24 MED ORDER — ENOXAPARIN SODIUM 40 MG/0.4ML ~~LOC~~ SOLN
40.0000 mg | SUBCUTANEOUS | Status: DC
  Administered 2012-01-24 – 2012-01-26 (×3): 40 mg via SUBCUTANEOUS
  Filled 2012-01-24 (×3): qty 0.4

## 2012-01-24 MED ORDER — FUROSEMIDE 10 MG/ML IJ SOLN
40.0000 mg | Freq: Once | INTRAMUSCULAR | Status: AC
  Administered 2012-01-24: 40 mg via INTRAVENOUS
  Filled 2012-01-24: qty 4

## 2012-01-24 MED ORDER — FUROSEMIDE 10 MG/ML IJ SOLN
20.0000 mg | Freq: Once | INTRAMUSCULAR | Status: AC
  Administered 2012-01-24: 20 mg via INTRAVENOUS
  Filled 2012-01-24: qty 2

## 2012-01-24 NOTE — Progress Notes (Signed)
Patient ambulated to bathroom and became short of breath. Checked oxygen stats 95% on 4L . Will continue to monitor.

## 2012-01-24 NOTE — Progress Notes (Signed)
Clinical Social Work:  Received consult that patient is from a group home: [facility].  Plan is for patient to return at discharge.  Have completed FL2 for MD to sign and will re-update it with current medications at time of discharge.  Has been at group home for 5 years.  Patient also has a sister who is involved.  Attempted to speak with sister but could not reach.  Will continue to follow patient while in hospital.  Full assessment is in shadow chart.  Ashley Jacobs, MSW LCSW [phone]

## 2012-01-24 NOTE — Progress Notes (Signed)
Patient got up in chair and tolerated well. Will continue to monitor.

## 2012-01-24 NOTE — Progress Notes (Signed)
Patient went back to bed and tolerated well on 4L [place].

## 2012-01-24 NOTE — Progress Notes (Signed)
Subjective: She seem to be doing better yesterday afternoon but this morning says she's more congested. She has no other new complaints.  Objective: Vital signs in last 24 hours: Temp:  [98 F (36.7 C)-99.4 F (37.4 C)] 98 F (36.7 C) (03/13 0508) Pulse Rate:  [85-92] 85  (03/13 0508) Resp:  [24] 24  (03/13 0508) BP: (143-157)/(75-80) 157/80 mmHg (03/13 0508) SpO2:  [91 %-100 %] 100 % (03/13 0756) FiO2 (%):  [35 %] 35 % (03/13 0751) Weight change:  Last BM Date: 01/21/12  Intake/Output from previous day: 03/12 0701 - 03/13 0700 In: 1140 [P.O.:240; I.V.:600; IV Piggyback:300] Out: 875 [Urine:875]  PHYSICAL EXAM General appearance: alert, cooperative and mild distress Resp: rhonchi bilaterally Cardio: regular rate and rhythm, S1, S2 normal, no murmur, click, rub or gallop GI: soft, non-tender; bowel sounds normal; no masses,  no organomegaly Extremities: extremities normal, atraumatic, no cyanosis or edema  Lab Results:    Basic Metabolic Panel: No results found for this basename: NA:2,K:2,CL:2,CO2:2,GLUCOSE:2,BUN:2,CREATININE:2,CALCIUM:2,MG:2,PHOS:2 in the last 72 hours Liver Function Tests: No results found for this basename: AST:2,ALT:2,ALKPHOS:2,BILITOT:2,PROT:2,ALBUMIN:2 in the last 72 hours No results found for this basename: LIPASE:2,AMYLASE:2 in the last 72 hours No results found for this basename: AMMONIA:2 in the last 72 hours CBC: No results found for this basename: WBC:2,NEUTROABS:2,HGB:2,HCT:2,MCV:2,PLT:2 in the last 72 hours Cardiac Enzymes: No results found for this basename: CKTOTAL:3,CKMB:3,CKMBINDEX:3,TROPONINI:3 in the last 72 hours BNP: No results found for this basename: PROBNP:3 in the last 72 hours D-Dimer: No results found for this basename: DDIMER:2 in the last 72 hours CBG: No results found for this basename: GLUCAP:6 in the last 72 hours Hemoglobin A1C: No results found for this basename: HGBA1C in the last 72 hours Fasting Lipid Panel: No  results found for this basename: CHOL,HDL,LDLCALC,TRIG,CHOLHDL,LDLDIRECT in the last 72 hours Thyroid Function Tests: No results found for this basename: TSH,T4TOTAL,FREET4,T3FREE,THYROIDAB in the last 72 hours Anemia Panel: No results found for this basename: VITAMINB12,FOLATE,FERRITIN,TIBC,IRON,RETICCTPCT in the last 72 hours Coagulation: No results found for this basename: LABPROT:2,INR:2 in the last 72 hours Urine Drug Screen: Drugs of Abuse  No results found for this basename: labopia, cocainscrnur, labbenz, amphetmu, thcu, labbarb    Alcohol Level: No results found for this basename: ETH:2 in the last 72 hours Urinalysis: No results found for this basename: COLORURINE:2,APPERANCEUR:2,LABSPEC:2,PHURINE:2,GLUCOSEU:2,HGBUR:2,BILIRUBINUR:2,KETONESUR:2,PROTEINUR:2,UROBILINOGEN:2,NITRITE:2,LEUKOCYTESUR:2 in the last 72 hours Misc. Labs:  ABGS No results found for this basename: PHART,PCO2,PO2ART,TCO2,HCO3 in the last 72 hours CULTURES No results found for this or any previous visit (from the past 240 hour(s)). Studies/Results: No results found.  Medications:  Scheduled:   . albuterol  2.5 mg Nebulization QID  . ALPRAZolam  1 mg Oral QHS  . aspirin EC  81 mg Oral Daily  . azithromycin  500 mg Intravenous Q24H  . cefTRIAXone (ROCEPHIN)  IV  1 g Intravenous Q24H  . diltiazem  240 mg Oral Daily  . enoxaparin  40 mg Subcutaneous Q24H  . ferrous sulfate  325 mg Oral Daily  . FLUoxetine  20 mg Oral Daily  . Fluticasone-Salmeterol  1 puff Inhalation Q12H  . furosemide  40 mg Intravenous Once  . furosemide  40 mg Oral Daily  . guaiFENesin  1,200 mg Oral BID  . ipratropium  0.5 mg Nebulization QID  . methylPREDNISolone (SOLU-MEDROL) injection  125 mg Intravenous Q6H  . mulitivitamin with minerals  1 tablet Oral Daily  . risperiDONE  0.5 mg Oral QHS   Continuous:   . sodium chloride 50 mL/hr at 01/23/12  2031   ZOX:[id number]  Assesment: She has COPD exacerbation. She is  improving but slowly. She has a history of volume overload so I'm going to give her a dose of Lasix intravenously. It has seemed to make some difference now that she's on steroids. Active Problems:  * No active hospital problems. *     Plan: Continue with current antibiotics etc. and Lasix for one dose and follow    LOS: 5 days   Tashya Alberty L 01/24/2012, 8:57 AM

## 2012-01-24 NOTE — Progress Notes (Addendum)
Dr. Fransico Him notified at 2315 that pt says "I can't talk." she was having trouble talking, she says she feels it is due to worsening congestion. Pt initially complained at about 2230-2240, Dr. Fransico Him paged at 2255, no answer. Pt denies chest pain, says that SOB is the same as it usually is. She says she has chest pain sometimes, but doesn't recall having any on this admission, and denies it at this time. Pt seems a little better with even with just some water given. Pt is sleeping well at this time. VS stable, BP slightly elevated, MD aware. 20mg  IV Lasix one time ordered. Nursing will monitor. Sheryn Bison  2330: notified RT of patients complaint of chest tightness, with the same SOB that is baseline. RT to evaluate.

## 2012-01-25 MED ORDER — ENSURE CLINICAL ST REVIGOR PO LIQD
237.0000 mL | Freq: Every morning | ORAL | Status: DC
  Administered 2012-01-25 – 2012-01-26 (×2): 237 mL via ORAL

## 2012-01-25 MED ORDER — DOCUSATE SODIUM 100 MG PO CAPS
100.0000 mg | ORAL_CAPSULE | Freq: Two times a day (BID) | ORAL | Status: DC
  Administered 2012-01-25 – 2012-01-26 (×3): 100 mg via ORAL
  Filled 2012-01-25 (×3): qty 1

## 2012-01-25 NOTE — Progress Notes (Signed)
This patient was discussed at long LOS rounds this morning. 

## 2012-01-25 NOTE — Progress Notes (Signed)
Called Dr. Juanetta Gosling who stated to give patient Colace 100mg  BID.

## 2012-01-25 NOTE — Progress Notes (Signed)
Subjective: She is overall about the same. She says she doesn't feel well but her chest is clear her and she's now on nasal cannula instead of on a mask oxygen.  Objective: Vital signs in last 24 hours: Temp:  [97.5 F (36.4 C)-97.9 F (36.6 C)] 97.9 F (36.6 C) (03/14 0605) Pulse Rate:  [71-93] 77  (03/14 0722) Resp:  [20-22] 20  (03/14 0722) BP: (113-151)/(71-80) 113/73 mmHg (03/14 0605) SpO2:  [94 %-100 %] 96 % (03/14 0815) Weight change:  Last BM Date: 01/21/12  Intake/Output from previous day: 03/13 0701 - 03/14 0700 In: 723 [P.O.:720; I.V.:3] Out: 3150 [Urine:3150]  PHYSICAL EXAM General appearance: alert, cooperative and mild distress Resp: clear to auscultation bilaterally Cardio: regular rate and rhythm, S1, S2 normal, no murmur, click, rub or gallop GI: soft, non-tender; bowel sounds normal; no masses,  no organomegaly Extremities: extremities normal, atraumatic, no cyanosis or edema  Lab Results:    Basic Metabolic Panel: No results found for this basename: NA:2,K:2,CL:2,CO2:2,GLUCOSE:2,BUN:2,CREATININE:2,CALCIUM:2,MG:2,PHOS:2 in the last 72 hours Liver Function Tests: No results found for this basename: AST:2,ALT:2,ALKPHOS:2,BILITOT:2,PROT:2,ALBUMIN:2 in the last 72 hours No results found for this basename: LIPASE:2,AMYLASE:2 in the last 72 hours No results found for this basename: AMMONIA:2 in the last 72 hours CBC: No results found for this basename: WBC:2,NEUTROABS:2,HGB:2,HCT:2,MCV:2,PLT:2 in the last 72 hours Cardiac Enzymes: No results found for this basename: CKTOTAL:3,CKMB:3,CKMBINDEX:3,TROPONINI:3 in the last 72 hours BNP: No results found for this basename: PROBNP:3 in the last 72 hours D-Dimer: No results found for this basename: DDIMER:2 in the last 72 hours CBG: No results found for this basename: GLUCAP:6 in the last 72 hours Hemoglobin A1C: No results found for this basename: HGBA1C in the last 72 hours Fasting Lipid Panel: No results  found for this basename: CHOL,HDL,LDLCALC,TRIG,CHOLHDL,LDLDIRECT in the last 72 hours Thyroid Function Tests: No results found for this basename: TSH,T4TOTAL,FREET4,T3FREE,THYROIDAB in the last 72 hours Anemia Panel: No results found for this basename: VITAMINB12,FOLATE,FERRITIN,TIBC,IRON,RETICCTPCT in the last 72 hours Coagulation: No results found for this basename: LABPROT:2,INR:2 in the last 72 hours Urine Drug Screen: Drugs of Abuse  No results found for this basename: labopia, cocainscrnur, labbenz, amphetmu, thcu, labbarb    Alcohol Level: No results found for this basename: ETH:2 in the last 72 hours Urinalysis: No results found for this basename: COLORURINE:2,APPERANCEUR:2,LABSPEC:2,PHURINE:2,GLUCOSEU:2,HGBUR:2,BILIRUBINUR:2,KETONESUR:2,PROTEINUR:2,UROBILINOGEN:2,NITRITE:2,LEUKOCYTESUR:2 in the last 72 hours Misc. Labs:  ABGS No results found for this basename: PHART,PCO2,PO2ART,TCO2,HCO3 in the last 72 hours CULTURES No results found for this or any previous visit (from the past 240 hour(s)). Studies/Results: No results found.  Medications:  Prior to Admission:  Prescriptions prior to admission  Medication Sig Dispense Refill  . alendronate (FOSAMAX) 70 MG tablet Take 70 mg by mouth every 7 (seven) days. Take with a full glass of water on an empty stomach.patient take on Monday      . ALPRAZolam (XANAX) 1 MG tablet Take 1 mg by mouth at bedtime.      Marland Kitchen aspirin EC 81 MG tablet Take 81 mg by mouth daily.      . cefUROXime (CEFTIN) 250 MG tablet Take 250 mg by mouth 2 (two) times daily. For 7 days patient started on 01/15/12      . diltiazem (TIAZAC) 240 MG 24 hr capsule Take 240 mg by mouth daily.      . ferrous sulfate 325 (65 FE) MG tablet Take 325 mg by mouth daily.      Marland Kitchen FLUoxetine (PROZAC) 20 MG tablet Take 20 mg by mouth  daily.      . fluticasone (FLONASE) 50 MCG/ACT nasal spray Place 2 sprays into the nose daily.      . Fluticasone-Salmeterol (ADVAIR) 250-50 MCG/DOSE  AEPB Inhale 1 puff into the lungs every 12 (twelve) hours.      . furosemide (LASIX) 40 MG tablet Take 40 mg by mouth daily.      . Multiple Vitamins-Minerals (HCA SUPER THERAVITE-M PO) Take 1 tablet by mouth daily.      . risperiDONE (RISPERDAL) 0.5 MG tablet Take 0.5 mg by mouth at bedtime.       Scheduled:   . albuterol  2.5 mg Nebulization QID  . ALPRAZolam  1 mg Oral QHS  . aspirin EC  81 mg Oral Daily  . azithromycin  500 mg Intravenous Q24H  . cefTRIAXone (ROCEPHIN)  IV  1 g Intravenous Q24H  . diltiazem  240 mg Oral Daily  . enoxaparin  40 mg Subcutaneous Q24H  . ferrous sulfate  325 mg Oral Daily  . FLUoxetine  20 mg Oral Daily  . Fluticasone-Salmeterol  1 puff Inhalation Q12H  . furosemide  20 mg Intravenous Once  . furosemide  40 mg Intravenous Once  . furosemide  40 mg Oral Daily  . guaiFENesin  1,200 mg Oral BID  . ipratropium  0.5 mg Nebulization QID  . methylPREDNISolone (SOLU-MEDROL) injection  125 mg Intravenous Q6H  . mulitivitamin with minerals  1 tablet Oral Daily  . risperiDONE  0.5 mg Oral QHS  . sodium chloride       Continuous:   . sodium chloride 50 mL/hr at 01/24/12 1327   ZOX:[id number]  Assesment: She says she doesn't feel well. She is admitted with an exacerbation of COPD but does seem to be improving although slowly. Active Problems:  * No active hospital problems. *     Plan: Continue with current treatments try to get her up and moving around    LOS: 6 days   Chea Malan L 01/25/2012, 8:43 AM

## 2012-01-25 NOTE — Progress Notes (Addendum)
At this time, pt is still very sleepy. She slept deeply most of the night. Pt is alert and oriented to person, place and time. Pt says she just doesn't feel well now. She says that there is no pain in particular, just generally not feeling well. Nursing to monitor. Pt continues to have some trouble talking when initially awakened, water provided, seems to help some. RT notified and breathing treatment encouraged. RT said they will be here soon to give her a treatment. Sheryn Bison

## 2012-01-25 NOTE — Evaluation (Signed)
Physical Therapy Evaluation Patient Details Name: [facility] MRN: [id number] DOB: 1940/12/03 Today's Date: 01/25/2012  Problem List: There is no problem list on file for this patient.   Past Medical History:  Past Medical History  Diagnosis Date  . COPD (chronic obstructive pulmonary disease)   . Hypertension   . Osteoporosis   . Anxiety   . Weakness   . Tachycardia   . Cancer     bladder  . Depression    Past Surgical History:  Past Surgical History  Procedure Date  . Bladder surgery   . Breast surgery     PT Assessment/Plan/Recommendation PT Assessment Clinical Impression Statement: pt is very cooperative, feels a bit deconditioned from baseline...strength and balance are WNL  ambulatory endurance is limited and she feels more secure using a walker for gait ( she uses one at home at times)... she should return to prior functional level once she returns home and resumes intermittent activity         PT Recommendation/Assessment: Patient will need skilled PT in the acute care venue PT Problem List: Decreased activity tolerance Barriers to Discharge: None PT Therapy Diagnosis : Generalized weakness PT Plan PT Frequency: Min 3X/week PT Treatment/Interventions: Gait training;Therapeutic exercise PT Recommendation Follow Up Recommendations: No PT follow up Equipment Recommended: None recommended by PT PT Goals  Acute Rehab PT Goals PT Goal Formulation: With patient Time For Goal Achievement: 7 days Pt will Ambulate: 51 - 150 feet;with supervision PT Goal: Ambulate - Progress: Goal set today  PT Evaluation Precautions/Restrictions  Precautions Required Braces or Orthoses: No Restrictions Weight Bearing Restrictions: No Prior Functioning  Home Living Receives Help From: Personal care attendant Type of Home: Assisted living Home Layout: One level Home Access: Level entry Bathroom Accessibility: Yes How Accessible: Accessible via walker Home Adaptive  Equipment: Walker - four wheeled Prior Function Level of Independence: Independent with basic ADLs;Independent with gait;Independent with transfers;Needs assistance with homemaking Driving: No Vocation: Retired Producer, television/film/video: Awake/alert Overall Cognitive Status: Appears within functional limits for tasks assessed Orientation Level: Oriented X4 Sensation/Coordination Sensation Light Touch: Appears Intact Stereognosis: Not tested Hot/Cold: Not tested Proprioception: Appears Intact Coordination Gross Motor Movements are Fluid and Coordinated: Yes Fine Motor Movements are Fluid and Coordinated: Yes Extremity Assessment RUE Assessment RUE Assessment: Within Functional Limits LUE Assessment LUE Assessment: Within Functional Limits RLE Assessment RLE Assessment: Within Functional Limits LLE Assessment LLE Assessment: Within Functional Limits Mobility (including Balance) Bed Mobility Bed Mobility: Yes Supine to Sit: 7: Independent Sit to Supine: 7: Independent Transfers Transfers: Yes Sit to Stand: 6: Modified independent (Device/Increase time) Stand to Sit: 6: Modified independent (Device/Increase time) Ambulation/Gait Ambulation/Gait: Yes Ambulation/Gait Assistance: 6: Modified independent (Device/Increase time) Ambulation Distance (Feet): 60 Feet Assistive device: Rolling walker;Other (Comment) (is able to walk with no assistive device, but prefers walker) Gait Pattern: Within Functional Limits Stairs: No Wheelchair Mobility Wheelchair Mobility: No  Posture/Postural Control Posture/Postural Control: No significant limitations Balance Balance Assessed: No (WNL) Exercise    End of Session PT - End of Session Equipment Utilized During Treatment: Gait belt Activity Tolerance: Patient tolerated treatment well Patient left: in chair;with call bell in reach;with bed alarm set Nurse Communication: Mobility status for transfers;Mobility status for  ambulation General Behavior During Session: [facility] for tasks performed Cognition: [facility] for tasks performed  Konrad Penta 01/25/2012, 11:27 AM

## 2012-01-25 NOTE — Progress Notes (Signed)
INITIAL ADULT NUTRITION ASSESSMENT Date: 01/25/2012   Time: 11:20 AM Reason for Assessment: Length of Stay  ASSESSMENT: Female 71 y.o.  Dx: <principal problem not specified>  Hx:  Past Medical History  Diagnosis Date  . COPD (chronic obstructive pulmonary disease)   . Hypertension   . Osteoporosis   . Anxiety   . Weakness   . Tachycardia   . Cancer     bladder  . Depression    Related Meds:  Scheduled Meds:   . albuterol  2.5 mg Nebulization QID  . ALPRAZolam  1 mg Oral QHS  . aspirin EC  81 mg Oral Daily  . azithromycin  500 mg Intravenous Q24H  . cefTRIAXone (ROCEPHIN)  IV  1 g Intravenous Q24H  . diltiazem  240 mg Oral Daily  . docusate sodium  100 mg Oral BID  . enoxaparin  40 mg Subcutaneous Q24H  . ferrous sulfate  325 mg Oral Daily  . FLUoxetine  20 mg Oral Daily  . Fluticasone-Salmeterol  1 puff Inhalation Q12H  . furosemide  20 mg Intravenous Once  . furosemide  40 mg Oral Daily  . guaiFENesin  1,200 mg Oral BID  . ipratropium  0.5 mg Nebulization QID  . methylPREDNISolone (SOLU-MEDROL) injection  125 mg Intravenous Q6H  . mulitivitamin with minerals  1 tablet Oral Daily  . risperiDONE  0.5 mg Oral QHS  . sodium chloride       Continuous Infusions:   . sodium chloride 50 mL/hr at 01/25/12 0914   PRN Meds:.albuterol  Ht: 5\' 6"  (167.6 cm)  Wt: 160 lb 8 oz (72.802 kg)  Ideal Wt: 59.0 kg % Ideal Wt: 123%  Usual Wt: unknown   Body mass index is 25.91 kg/(m^2).  Food/Nutrition Related Hx: Patient reports poor appetite and difficulty swallowing foods. Patient stated feels like food gets stuck in the throat. Varied PO intake documented at meals between 25-100%.   Labs:  CMP     Component Value Date/Time   NA 142 01/19/2012 1310   K 3.6 01/19/2012 1310   CL 97 01/19/2012 1310   CO2 38* 01/19/2012 1310   GLUCOSE 117* 01/19/2012 1310   BUN 13 01/19/2012 1310   CREATININE 0.65 01/19/2012 1310   CALCIUM 9.3 01/19/2012 1310   GFRNONAA 88* 01/19/2012 1310   GFRAA  >90 01/19/2012 1310    Intake/Output Summary (Last 24 hours) at 01/25/12 1122 Last data filed at 01/24/12 2300  Gross per 24 hour  Intake    483 ml  Output   1775 ml  Net  -1292 ml     Diet Order: General  Supplements/Tube Feeding: none at this time  IVF:    sodium chloride Last Rate: 50 mL/hr at 01/25/12 0914    Estimated Nutritional Needs:   Kcal: 1270-1570 Protein: 80-94.5 grams Fluid: 1 ml per kcal  NUTRITION DIAGNOSIS: -Inadequate oral intake (NI-2.1).  Status: Ongoing  RELATED TO: swallowing difficulty and poor appetite  AS EVIDENCE BY: Varied PO intake documented at meals between 25-100%, patient reports poor appetite and foods getting stuck in the throat upon swallowing.   MONITORING/EVALUATION(Goals): Swallowing ability, PO intake, Labs, I/O's 1. Meet > 90% of estimated energy needs 2. PO intake greater than 75% at meals and supplements. 3. Ease of swallowing   EDUCATION NEEDS: -No education needs identified at this time  INTERVENTION: 1. Will down grade diet to Dysphagia 3 Diet. 2. Will order Ensure supplement once daily. Provides 350 kcal and 13 grams of protein  daily.  3. RD to follow for nutrition needs.  Dietitian [phone]  DOCUMENTATION CODES Per approved criteria  -Not Applicable    Iven Finn [facility] 01/25/2012, 11:20 AM

## 2012-01-25 NOTE — Progress Notes (Signed)
Patient assisted from chair back to bed. Patient tolerated well. Will continue to monitor.

## 2012-01-25 NOTE — Progress Notes (Signed)
Patient asked for something to soften her stool. Paged Dr. Felecia Shelling.

## 2012-01-25 NOTE — Progress Notes (Signed)
Pt has continued to sleep well throughout the night. Upon assessment, she continues to deny chest pain, but still is having trouble with congestion, dry mouth, and some tightness in her chest. Speech is much better after having some water, but is still congested and dry sounding. Nursing will continue to monitor. Sheryn Bison

## 2012-01-26 DIAGNOSIS — R000 Tachycardia, unspecified: Secondary | ICD-10-CM | POA: Diagnosis present

## 2012-01-26 DIAGNOSIS — F319 Bipolar disorder, unspecified: Secondary | ICD-10-CM | POA: Diagnosis present

## 2012-01-26 DIAGNOSIS — J441 Chronic obstructive pulmonary disease with (acute) exacerbation: Secondary | ICD-10-CM | POA: Diagnosis present

## 2012-01-26 DIAGNOSIS — I10 Essential (primary) hypertension: Secondary | ICD-10-CM | POA: Diagnosis present

## 2012-01-26 MED ORDER — CEFUROXIME AXETIL 500 MG PO TABS: 500.0000 mg | ORAL_TABLET | Freq: Two times a day (BID) | ORAL | Status: AC

## 2012-01-26 MED ORDER — METHYLPREDNISOLONE 4 MG PO KIT: PACK | ORAL | Status: AC

## 2012-01-26 NOTE — Progress Notes (Signed)
Clinical Social Worker spoke with Pam at [facility] [phone]) and she is able to receive patient today if medically stable. Pam stated that she will be transporting patient back to facility today. CSW will facilitate discharge once discharge summary is completed.   Rozetta Nunnery MSW, [employer] [phone]

## 2012-01-26 NOTE — Progress Notes (Signed)
Subjective: She feels better. She's not as short of breath. She's not coughing. She is back on nasal oxygen.  Objective: Vital signs in last 24 hours: Temp:  [97.3 F (36.3 C)-98 F (36.7 C)] 97.3 F (36.3 C) (03/15 0520) Pulse Rate:  [73-96] 73  (03/15 0520) Resp:  [20] 20  (03/15 0520) BP: (126-137)/(68-72) 126/72 mmHg (03/15 0520) SpO2:  [96 %-99 %] 98 % (03/15 0722) Weight change:  Last BM Date: 01/21/12  Intake/Output from previous day: 03/14 0701 - 03/15 0700 In: 2100 [I.V.:2100] Out: 500 [Urine:500]  PHYSICAL EXAM General appearance: alert, cooperative and mild distress Resp: rhonchi bilaterally Cardio: regular rate and rhythm, S1, S2 normal, no murmur, click, rub or gallop GI: soft, non-tender; bowel sounds normal; no masses,  no organomegaly Extremities: extremities normal, atraumatic, no cyanosis or edema  Lab Results:    Basic Metabolic Panel: No results found for this basename: NA:2,K:2,CL:2,CO2:2,GLUCOSE:2,BUN:2,CREATININE:2,CALCIUM:2,MG:2,PHOS:2 in the last 72 hours Liver Function Tests: No results found for this basename: AST:2,ALT:2,ALKPHOS:2,BILITOT:2,PROT:2,ALBUMIN:2 in the last 72 hours No results found for this basename: LIPASE:2,AMYLASE:2 in the last 72 hours No results found for this basename: AMMONIA:2 in the last 72 hours CBC: No results found for this basename: WBC:2,NEUTROABS:2,HGB:2,HCT:2,MCV:2,PLT:2 in the last 72 hours Cardiac Enzymes: No results found for this basename: CKTOTAL:3,CKMB:3,CKMBINDEX:3,TROPONINI:3 in the last 72 hours BNP: No results found for this basename: PROBNP:3 in the last 72 hours D-Dimer: No results found for this basename: DDIMER:2 in the last 72 hours CBG: No results found for this basename: GLUCAP:6 in the last 72 hours Hemoglobin A1C: No results found for this basename: HGBA1C in the last 72 hours Fasting Lipid Panel: No results found for this basename: CHOL,HDL,LDLCALC,TRIG,CHOLHDL,LDLDIRECT in the last 72  hours Thyroid Function Tests: No results found for this basename: TSH,T4TOTAL,FREET4,T3FREE,THYROIDAB in the last 72 hours Anemia Panel: No results found for this basename: VITAMINB12,FOLATE,FERRITIN,TIBC,IRON,RETICCTPCT in the last 72 hours Coagulation: No results found for this basename: LABPROT:2,INR:2 in the last 72 hours Urine Drug Screen: Drugs of Abuse  No results found for this basename: labopia, cocainscrnur, labbenz, amphetmu, thcu, labbarb    Alcohol Level: No results found for this basename: ETH:2 in the last 72 hours Urinalysis: No results found for this basename: COLORURINE:2,APPERANCEUR:2,LABSPEC:2,PHURINE:2,GLUCOSEU:2,HGBUR:2,BILIRUBINUR:2,KETONESUR:2,PROTEINUR:2,UROBILINOGEN:2,NITRITE:2,LEUKOCYTESUR:2 in the last 72 hours Misc. Labs:  ABGS No results found for this basename: PHART,PCO2,PO2ART,TCO2,HCO3 in the last 72 hours CULTURES No results found for this or any previous visit (from the past 240 hour(s)). Studies/Results: No results found.  Medications:  Prior to Admission:  Prescriptions prior to admission  Medication Sig Dispense Refill  . alendronate (FOSAMAX) 70 MG tablet Take 70 mg by mouth every 7 (seven) days. Take with a full glass of water on an empty stomach.patient take on Monday      . ALPRAZolam (XANAX) 1 MG tablet Take 1 mg by mouth at bedtime.      Marland Kitchen aspirin EC 81 MG tablet Take 81 mg by mouth daily.      . cefUROXime (CEFTIN) 250 MG tablet Take 250 mg by mouth 2 (two) times daily. For 7 days patient started on 01/15/12      . diltiazem (TIAZAC) 240 MG 24 hr capsule Take 240 mg by mouth daily.      . ferrous sulfate 325 (65 FE) MG tablet Take 325 mg by mouth daily.      Marland Kitchen FLUoxetine (PROZAC) 20 MG tablet Take 20 mg by mouth daily.      . fluticasone (FLONASE) 50 MCG/ACT nasal spray Place 2  sprays into the nose daily.      . Fluticasone-Salmeterol (ADVAIR) 250-50 MCG/DOSE AEPB Inhale 1 puff into the lungs every 12 (twelve) hours.      . furosemide  (LASIX) 40 MG tablet Take 40 mg by mouth daily.      . Multiple Vitamins-Minerals (HCA SUPER THERAVITE-M PO) Take 1 tablet by mouth daily.      . risperiDONE (RISPERDAL) 0.5 MG tablet Take 0.5 mg by mouth at bedtime.       Scheduled:   . albuterol  2.5 mg Nebulization QID  . ALPRAZolam  1 mg Oral QHS  . aspirin EC  81 mg Oral Daily  . azithromycin  500 mg Intravenous Q24H  . cefTRIAXone (ROCEPHIN)  IV  1 g Intravenous Q24H  . diltiazem  240 mg Oral Daily  . docusate sodium  100 mg Oral BID  . enoxaparin  40 mg Subcutaneous Q24H  . feeding supplement  237 mL Oral q morning - 10a  . ferrous sulfate  325 mg Oral Daily  . FLUoxetine  20 mg Oral Daily  . Fluticasone-Salmeterol  1 puff Inhalation Q12H  . furosemide  40 mg Oral Daily  . guaiFENesin  1,200 mg Oral BID  . ipratropium  0.5 mg Nebulization QID  . methylPREDNISolone (SOLU-MEDROL) injection  125 mg Intravenous Q6H  . mulitivitamin with minerals  1 tablet Oral Daily  . risperiDONE  0.5 mg Oral QHS   Continuous:   . sodium chloride 50 mL/hr at 01/25/12 0914   WUJ:[id number]  Assesment: She was admitted with COPD exacerbation and clinically has pneumonia. She is much improved and ready for discharge Active Problems:  * No active hospital problems. *     Plan: Discharge home today    LOS: 7 days   Pastor Sgro L 01/26/2012, 8:48 AM

## 2012-01-26 NOTE — Progress Notes (Signed)
   CARE MANAGEMENT NOTE 01/26/2012  Patient:  April Pearson, April Pearson   Account Number:  [id number]  Date Initiated:  01/22/2012  Documentation initiated by:  Sharrie Rothman  Subjective/Objective Assessment:   Pt admitted from [facility]. Pt does have O2 from [place]. May need Home Health at discharge.     Action/Plan:   CM spoke with pt and will continue to follow needs for possible home health at discharge.   Anticipated DC Date:  01/26/2012   Anticipated DC Plan:  ASSISTED LIVING / REST HOME  In-house referral  Clinical Social Worker      DC Planning Services  CM consult      Choice offered to / List presented to:  C-2 HC POA / Guardian        HH arranged  HH-1 RN  HH-10 DISEASE MANAGEMENT      HH agency  [facility]   Status of service:  Completed, signed off Medicare Important Message given?  YES (If response is "NO", the following Medicare IM given date fields will be blank) Date Medicare IM given:  01/26/2012 Date Additional Medicare IM given:    Discharge Disposition:  ASSISTED LIVING  Per UR Regulation:    If discussed at Long Length of Stay Meetings, dates discussed:   01/25/2012    Comments:  01/26/12 1610 Arlyss Queen, RN BSN Care Management [phone] Pt discharged back to [facility]. Gretel Acre, coadministrator, agress to take pt back. The facility uses [facility]. Alroy Bailiff from [facility] aware and will collect information from pt chart. Pt nurse made aware. Pt alreay has oxygen at facility. No other CM needs noted.  01/22/2012 Arlyss Queen, RN BSN Case Manager [phone] Spoke with patient about discharge plans. Pt from [facility] and wants to return at discharge if appropriate. Pt has O2 from [place]. CSW referral made. Will continue to monitor for discharge needs.

## 2012-01-26 NOTE — Discharge Instructions (Signed)
Home health set up with [facility] for registered nurse.

## 2012-01-26 NOTE — Progress Notes (Signed)
D/C instructions and new medications given to pt and pts caregiver. No issues or concerns addressed. Pt d/ced at this time.  Taken out of facility by staff via w/c.

## 2012-01-26 NOTE — Progress Notes (Signed)
UR Chart Review Completed  

## 2012-01-27 NOTE — Discharge Summary (Signed)
Physician Discharge Summary  Patient ID: April Pearson MRN: [id number] DOB/AGE: Jul 24, 1941 71 y.o. Primary Care Physician:Talyah Seder L, MD, MD Admit date: 01/19/2012 Discharge date: 01/27/2012    Discharge Diagnoses:   Principal Problem:  *COPD with acute exacerbation Active Problems:  Hypertension  Bipolar disease, chronic  Tachycardia   Medication List  As of 01/27/2012  8:49 AM   TAKE these medications         alendronate 70 MG tablet   Commonly known as: FOSAMAX   Take 70 mg by mouth every 7 (seven) days. Take with a full glass of water on an empty stomach.patient take on Monday      ALPRAZolam 1 MG tablet   Commonly known as: XANAX   Take 1 mg by mouth at bedtime.      aspirin EC 81 MG tablet   Take 81 mg by mouth daily.      cefUROXime 250 MG tablet   Commonly known as: CEFTIN   Take 250 mg by mouth 2 (two) times daily. For 7 days patient started on 01/15/12      cefUROXime 500 MG tablet   Commonly known as: CEFTIN   Take 1 tablet (500 mg total) by mouth 2 (two) times daily.      diltiazem 240 MG 24 hr capsule   Commonly known as: TIAZAC   Take 240 mg by mouth daily.      ferrous sulfate 325 (65 FE) MG tablet   Take 325 mg by mouth daily.      FLUoxetine 20 MG tablet   Commonly known as: PROZAC   Take 20 mg by mouth daily.      fluticasone 50 MCG/ACT nasal spray   Commonly known as: FLONASE   Place 2 sprays into the nose daily.      Fluticasone-Salmeterol 250-50 MCG/DOSE Aepb   Commonly known as: ADVAIR   Inhale 1 puff into the lungs every 12 (twelve) hours.      furosemide 40 MG tablet   Commonly known as: LASIX   Take 40 mg by mouth daily.      HCA SUPER THERAVITE-M PO   Take 1 tablet by mouth daily.      methylPREDNISolone 4 MG tablet   Commonly known as: MEDROL DOSEPAK   follow package directions      risperiDONE 0.5 MG tablet   Commonly known as: RISPERDAL   Take 0.5 mg by mouth at bedtime.            Discharged Condition:  Improved    Consults: None  Significant Diagnostic Studies: Dg Chest 2 View  01/18/2012  *RADIOLOGY REPORT*  Clinical Data: Cough.  Short of breath.  CHEST - 2 VIEW  Comparison: 10/29/2011 CT.  10/29/2011 radiograph.  Findings: Emphysema is present.  There is no airspace disease or effusion identified.  Atherosclerotic calcification of the aortic arch.  Chronic scarring is present along the right middle lobe on the lateral view.  IMPRESSION: Emphysema without active cardiopulmonary disease.  Chronic right middle lobe scarring.  Original Report Authenticated By: Andreas Newport, M.D.   Dg Chest Portable 1 View  01/19/2012  *RADIOLOGY REPORT*  Clinical Data: Shortness of breath.  Emphysema.  History of bladder cancer.  PORTABLE CHEST - 1 VIEW  Comparison: CT chest 10/28/2006.  Plain films of the chest 01/18/2012.  Findings: Lungs are emphysematous but clear.  Heart size is normal. No pneumothorax or effusion.  Remote right seventh rib fracture noted.  IMPRESSION: Emphysema without acute disease.  Original Report Authenticated By: Bernadene Bell. Maricela Curet, M.D.    Lab Results: Basic Metabolic Panel: No results found for this basename: NA:2,K:2,CL:2,CO2:2,GLUCOSE:2,BUN:2,CREATININE:2,CALCIUM:2,MG:2,PHOS:2 in the last 72 hours Liver Function Tests: No results found for this basename: AST:2,ALT:2,ALKPHOS:2,BILITOT:2,PROT:2,ALBUMIN:2 in the last 72 hours   CBC: No results found for this basename: WBC:2,NEUTROABS:2,HGB:2,HCT:2,MCV:2,PLT:2 in the last 72 hours  No results found for this or any previous visit (from the past 240 hour(s)).   Hospital Course: She was admitted with COPD exacerbation and potential pneumonia. She did not show pneumonia on her chest x-ray but clinically appeared like she might have pneumonia. She was treated with intravenous antibiotics steroids inhaled bronchodilators and slowly improved. She was able to get up and ambulate by the time of discharge was evaluated by the caretakers  at her assisted-living facility and was felt to be back to baseline  Discharge Exam: Blood pressure 126/72, pulse 73, temperature 97.3 F (36.3 C), temperature source Oral, resp. rate 20, height 5\' 6"  (1.676 m), weight 72.802 kg (160 lb 8 oz), SpO2 98.00%. She was awake and alert. Her chest was clear. She had decreased breath sounds. Her heart was regular and abdomen soft  Disposition: Discharge back to her assisted living facility with home health  Discharge Orders    Future Orders Please Complete By Expires   Home Health      Questions: Responses:   To provide the following care/treatments RN   Face-to-face encounter      Comments:   I Emilea Goga L certify that this patient is under my care and that I, or a nurse practitioner or physician's assistant working with me, had a face-to-face encounter that meets the physician face-to-face encounter requirements with this patient on 01/26/2012.       Questions: Responses:   The encounter with the patient was in whole, or in part, for the following medical condition, which is the primary reason for home health care COPD exacerbation   I certify that, based on my findings, the following services are medically necessary home health services Nursing   My clinical findings support the need for the above services Acute exacerbation of COPD   Further, I certify that my clinical findings support that this patient is homebound (i.e. absences from home require considerable and taxing effort and are for medical reasons or religious services or infrequently or of short duration when for other reasons) Ambulates short distances less than 300 feet   To provide the following care/treatments RN   Discharge patient         Follow-up Information    Follow up with Fredirick Maudlin, MD .         Signed: Fredirick Maudlin Pager [phone]  01/27/2012, 8:49 AM

## 2012-05-03 ENCOUNTER — Ambulatory Visit (INDEPENDENT_AMBULATORY_CARE_PROVIDER_SITE_OTHER): Payer: Medicare Other | Admitting: Urology

## 2012-05-03 DIAGNOSIS — Z8551 Personal history of malignant neoplasm of bladder: Secondary | ICD-10-CM

## 2012-08-09 ENCOUNTER — Ambulatory Visit (INDEPENDENT_AMBULATORY_CARE_PROVIDER_SITE_OTHER): Payer: Medicare Other | Admitting: Urology

## 2012-08-09 DIAGNOSIS — Z8551 Personal history of malignant neoplasm of bladder: Secondary | ICD-10-CM

## 2012-08-16 ENCOUNTER — Other Ambulatory Visit: Payer: Self-pay | Admitting: Urology

## 2012-08-16 ENCOUNTER — Ambulatory Visit (INDEPENDENT_AMBULATORY_CARE_PROVIDER_SITE_OTHER): Payer: Medicare Other | Admitting: Urology

## 2012-08-16 DIAGNOSIS — C679 Malignant neoplasm of bladder, unspecified: Secondary | ICD-10-CM

## 2012-08-16 DIAGNOSIS — Z8551 Personal history of malignant neoplasm of bladder: Secondary | ICD-10-CM

## 2012-08-20 ENCOUNTER — Ambulatory Visit (HOSPITAL_COMMUNITY)
Admission: RE | Admit: 2012-08-20 | Discharge: 2012-08-20 | Disposition: A | Discharge status: Home / Self Care | Payer: Medicare Other | Source: Ambulatory Visit | Attending: Urology | Admitting: Urology

## 2012-08-20 ENCOUNTER — Encounter (HOSPITAL_COMMUNITY): Payer: Self-pay

## 2012-08-20 DIAGNOSIS — C679 Malignant neoplasm of bladder, unspecified: Secondary | ICD-10-CM | POA: Insufficient documentation

## 2012-08-20 DIAGNOSIS — K5730 Diverticulosis of large intestine without perforation or abscess without bleeding: Secondary | ICD-10-CM | POA: Insufficient documentation

## 2012-08-20 LAB — POCT I-STAT, CHEM 8
BUN: 18 mg/dL (ref 6–23)
Calcium, Ion: 1.17 mmol/L (ref 1.13–1.30)
Chloride: 96 mEq/L (ref 96–112)
Glucose, Bld: 121 mg/dL — ABNORMAL HIGH (ref 70–99)

## 2012-08-20 MED ORDER — IOHEXOL 300 MG/ML  SOLN
125.0000 mL | Freq: Once | INTRAMUSCULAR | Status: AC | PRN
  Administered 2012-08-20: 125 mL via INTRAVENOUS

## 2012-08-21 NOTE — Progress Notes (Signed)
Blood sample obtained from left arm IV for Creatnine level.  

## 2012-08-23 ENCOUNTER — Ambulatory Visit (INDEPENDENT_AMBULATORY_CARE_PROVIDER_SITE_OTHER): Payer: Medicare Other | Admitting: Urology

## 2012-08-23 DIAGNOSIS — Z8551 Personal history of malignant neoplasm of bladder: Secondary | ICD-10-CM

## 2012-11-08 ENCOUNTER — Ambulatory Visit (INDEPENDENT_AMBULATORY_CARE_PROVIDER_SITE_OTHER): Payer: Medicare Other | Admitting: Urology

## 2012-11-08 DIAGNOSIS — Z8551 Personal history of malignant neoplasm of bladder: Secondary | ICD-10-CM

## 2012-11-22 ENCOUNTER — Ambulatory Visit (INDEPENDENT_AMBULATORY_CARE_PROVIDER_SITE_OTHER): Payer: Medicare Other | Admitting: Urology

## 2012-11-22 DIAGNOSIS — C679 Malignant neoplasm of bladder, unspecified: Secondary | ICD-10-CM

## 2012-11-29 ENCOUNTER — Ambulatory Visit (INDEPENDENT_AMBULATORY_CARE_PROVIDER_SITE_OTHER): Payer: Medicare Other | Admitting: Urology

## 2012-11-29 DIAGNOSIS — Z8551 Personal history of malignant neoplasm of bladder: Secondary | ICD-10-CM

## 2013-05-09 ENCOUNTER — Ambulatory Visit (INDEPENDENT_AMBULATORY_CARE_PROVIDER_SITE_OTHER): Payer: Medicare Other | Admitting: Urology

## 2013-05-09 DIAGNOSIS — Z8551 Personal history of malignant neoplasm of bladder: Secondary | ICD-10-CM

## 2013-05-09 DIAGNOSIS — C675 Malignant neoplasm of bladder neck: Secondary | ICD-10-CM

## 2013-05-21 ENCOUNTER — Other Ambulatory Visit: Payer: Self-pay | Admitting: Urology

## 2013-06-09 ENCOUNTER — Encounter (HOSPITAL_COMMUNITY): Payer: Self-pay | Admitting: Pharmacy Technician

## 2013-06-12 ENCOUNTER — Inpatient Hospital Stay (HOSPITAL_COMMUNITY): Admission: RE | Admit: 2013-06-12 | Payer: Medicare Other | Source: Ambulatory Visit

## 2013-06-13 ENCOUNTER — Ambulatory Visit (HOSPITAL_COMMUNITY)
Admission: RE | Admit: 2013-06-13 | Discharge: 2013-06-13 | Disposition: A | Discharge status: Home / Self Care | Payer: Medicare Other | Source: Ambulatory Visit | Attending: Urology | Admitting: Urology

## 2013-06-13 ENCOUNTER — Encounter (HOSPITAL_COMMUNITY)
Admission: RE | Admit: 2013-06-13 | Discharge: 2013-06-13 | Disposition: A | Discharge status: Home / Self Care | Payer: Medicare Other | Source: Ambulatory Visit | Attending: Urology | Admitting: Urology

## 2013-06-13 ENCOUNTER — Encounter (HOSPITAL_COMMUNITY): Payer: Self-pay

## 2013-06-13 DIAGNOSIS — Z01810 Encounter for preprocedural cardiovascular examination: Secondary | ICD-10-CM | POA: Insufficient documentation

## 2013-06-13 DIAGNOSIS — I700 Atherosclerosis of aorta: Secondary | ICD-10-CM | POA: Insufficient documentation

## 2013-06-13 DIAGNOSIS — M439 Deforming dorsopathy, unspecified: Secondary | ICD-10-CM | POA: Insufficient documentation

## 2013-06-13 DIAGNOSIS — C679 Malignant neoplasm of bladder, unspecified: Secondary | ICD-10-CM | POA: Insufficient documentation

## 2013-06-13 DIAGNOSIS — Z01818 Encounter for other preprocedural examination: Secondary | ICD-10-CM | POA: Insufficient documentation

## 2013-06-13 HISTORY — DX: Other specified forms of tremor: G25.2

## 2013-06-13 HISTORY — DX: Gastro-esophageal reflux disease without esophagitis: K21.9

## 2013-06-13 HISTORY — DX: Unspecified osteoarthritis, unspecified site: M19.90

## 2013-06-13 HISTORY — DX: Shortness of breath: R06.02

## 2013-06-13 HISTORY — DX: Dizziness and giddiness: R42

## 2013-06-13 LAB — BASIC METABOLIC PANEL
CO2: 38 mEq/L — ABNORMAL HIGH (ref 19–32)
Calcium: 10.1 mg/dL (ref 8.4–10.5)
Glucose, Bld: 97 mg/dL (ref 70–99)
Potassium: 3.8 mEq/L (ref 3.5–5.1)
Sodium: 142 mEq/L (ref 135–145)

## 2013-06-13 LAB — CBC
Hemoglobin: 11 g/dL — ABNORMAL LOW (ref 12.0–15.0)
MCH: 28.9 pg (ref 26.0–34.0)
Platelets: 220 10*3/uL (ref 150–400)
RBC: 3.81 MIL/uL — ABNORMAL LOW (ref 3.87–5.11)
WBC: 6.9 10*3/uL (ref 4.0–10.5)

## 2013-06-13 NOTE — Pre-Procedure Instructions (Signed)
NOTE OF MEDICAL CLEARANCE FOR BLADDER BIOPSY SURGERY ON PT'S CHART FROM DR. HAWKINS. EKG AND CXR WERE DONE TODAY -PREOP - AT [facility].

## 2013-06-13 NOTE — Patient Instructions (Addendum)
YOUR SURGERY IS SCHEDULED AT [facility]  ON:  Thursday  8/7  REPORT TO [place] [facility] AT:  5:30 AM      PHONE # FOR SHORT STAY IS [phone]  DO NOT EAT OR DRINK ANYTHING AFTER MIDNIGHT THE NIGHT BEFORE YOUR SURGERY.  YOU MAY BRUSH YOUR TEETH, RINSE OUT YOUR MOUTH--BUT NO WATER, NO FOOD, NO CHEWING GUM, NO MINTS, NO CANDIES, NO CHEWING TOBACCO.  PLEASE TAKE THE FOLLOWING MEDICATIONS THE AM OF YOUR SURGERY WITH A FEW SIPS OF WATER:  DILTIAZEM, FLUOXETINE.  PLEASE USE YOUR ADVAIR, SPIRIVA INHALERS.  MAY USE YOUR FLONASE NASAL SPRAY.  PLEASE USE YOU ALBUTEROL NEBULIZER.    DO NOT BRING VALUABLES, MONEY, CREDIT CARDS.  DO NOT WEAR JEWELRY, MAKE-UP, NAIL POLISH AND NO METAL PINS OR CLIPS IN YOUR HAIR. CONTACT LENS, DENTURES / PARTIALS, GLASSES SHOULD NOT BE WORN TO SURGERY AND IN MOST CASES-HEARING AIDS WILL NEED TO BE REMOVED.  BRING YOUR GLASSES CASE, ANY EQUIPMENT NEEDED FOR YOUR CONTACT LENS. FOR PATIENTS ADMITTED TO THE HOSPITAL--CHECK OUT TIME THE DAY OF DISCHARGE IS 11:00 AM.  ALL INPATIENT ROOMS ARE PRIVATE - WITH BATHROOM, TELEPHONE, TELEVISION AND WIFI INTERNET.  IF YOU ARE BEING DISCHARGED THE SAME DAY OF YOUR SURGERY--YOU CAN NOT DRIVE YOURSELF HOME--AND SHOULD NOT GO HOME ALONE BY TAXI OR BUS.  NO DRIVING OR OPERATING MACHINERY FOR 24 HOURS FOLLOWING ANESTHESIA / PAIN MEDICATIONS.  PLEASE MAKE ARRANGEMENTS FOR SOMEONE TO BE WITH YOU AT HOME THE FIRST 24 HOURS AFTER SURGERY. RESPONSIBLE DRIVER'S NAME SHELBY SOMERS - PT'S SISTER                                               PHONE #   [phone]                                                              DIANE VERNON - PT'S SISTER                                                                   [phone]                          FAILURE TO FOLLOW THESE INSTRUCTIONS MAY RESULT IN THE CANCELLATION OF YOUR SURGERY.   PATIENT SIGNATURE_________________________________

## 2013-06-18 NOTE — H&P (Signed)
1. History of  Bladder Cancer V10.51 2. Malignant Neoplasm Of The Neck Of The Bladder 188.5 3. Microscopic Hematuria 599.72 4. Urine Tests Nonspecific Abnormal Findings 791.9  History of Present Illness     April Pearson returns today for her history of bladder cancer that was resected by Dr. Baldo Ash in 12/11.  She has received both induction and maintenance BCG and got her last round in January.   She had cytologic atypia at that time.  She had a negative CT in 10/13.   Her tumor was HG NMIBC.  She has had no gross hematuria or associated signs of symptoms but she does have microhematuria today with 7/10 RBC's. .   Past Medical History 1. History of  Anxiety (Symptom) 300.00 2. History of  Bladder Cancer V10.51 3. History of  Bladder Cancer V10.51 4. History of  Chronic Obstructive Pulmonary Disease 496 5. History of  Hypertension 401.9 6. History of  Osteoporosis 733.00 7. History of  Vertigo 780.4  Surgical History 1. History of  Bladder Surgery 2. History of  Breast Surgery Lumpectomy  Current Meds 1. Acetaminophen 325 MG Oral Tablet; Therapy: (Recorded:21Jun2013) to 2. Advair Diskus 250-50 MCG/DOSE Inhalation Aerosol Powder Breath Activated; Therapy:  (Recorded:21Jun2013) to 3. Albuterol Sulfate NEBU; Therapy: (Recorded:21Jun2013) to 4. Alendronate Sodium 70 MG Oral Tablet; Therapy: (Recorded:21Jun2013) to 5. ALPRAZolam 1 MG Oral Tablet; Therapy: (Recorded:21Jun2013) to 6. Aspirin 81 MG Oral Tablet; Therapy: (Recorded:21Jun2013) to 7. Calcium 500 + D TABS; Therapy: (Recorded:21Jun2013) to 8. Diltiazem HCl ER 240 MG Oral Capsule Extended Release 24 Hour; Therapy:  (Recorded:21Jun2013) to 9. Ferrous Sulfate TABS; Therapy: (Recorded:21Jun2013) to 10. FLUoxetine HCl 20 MG Oral Capsule; Therapy: (Recorded:21Jun2013) to 11. Fluticasone Propionate 50 MCG/ACT Nasal Suspension; Therapy: (Recorded:21Jun2013) to 12. Furosemide 40 MG Oral Tablet; Therapy: (Recorded:21Jun2013) to 13.  Meclizine HCl 25 MG Oral Tablet; Therapy: (Recorded:21Jun2013) to 14. Multi-Vitamin TABS; Therapy: (Recorded:21Jun2013) to 15. Oxygen Use; Therapy: (Recorded:27Sep2013) to 16. RisperiDONE TABS; Therapy: (Recorded:21Jun2013) to 17. Tussionex Pennkinetic ER 10-8 MG/5ML Oral Liquid Extended Release; Therapy:   (Recorded:21Jun2013) to  Allergies 1. No Known Drug Allergies  Family History 1. Family history of  Carcinoma Of The Stomach V16.0 2. Family history of  Death In The Family Father 3. Family history of  Death In The Family Mother 4. Family history of  Hypertension V17.49 5. Family history of  Interstitial Emphysema 6. Family history of  Renal Disease  Social History 1. Former Smoker V15.82 2 pks p/d for 35 yrs quit 7 yrs 2. Marital History - Divorced V61.03 3. Never Drank Alcohol 4. Occupation: Retired   Past and social history reviewed and updated.   Review of Systems  Genitourinary: no hematuria.  Gastrointestinal: no flank pain.  Respiratory: shortness of breath.    Vitals Vital Signs [Data Includes: Last 1 Day]  27Jun2014 10:22AM  Blood Pressure: 167 / 63 Temperature: 98.3 F Heart Rate: 99  Physical Exam Constitutional: Well nourished and well developed . No acute distress.  pursed lip breathing was noted. Decreased breath sounds heard on ascultation.  Cardiovascular: Heart rate and rhythm are normal . No peripheral edema.    Results/Data Urine [Data Includes: Last 1 Day]   27Jun2014  COLOR YELLOW   APPEARANCE CLEAR   SPECIFIC GRAVITY 1.010   pH 7.5   GLUCOSE NEG mg/dL  BILIRUBIN NEG   KETONE NEG mg/dL  BLOOD TRACE   PROTEIN NEG mg/dL  UROBILINOGEN 0.2 mg/dL  NITRITE NEG   LEUKOCYTE ESTERASE NEG   SQUAMOUS EPITHELIAL/HPF  RARE   WBC 0-2 WBC/hpf  RBC 7-10 RBC/hpf  BACTERIA NONE SEEN   CRYSTALS NONE SEEN   CASTS NONE SEEN    Procedure  Procedure: Cystoscopy  Chaperone Present: Alfredo Martinez.  Indication: History of Urothelial Carcinoma.  Prep:  The patient was prepped with betadine.  Procedure Note:  Urethral meatus:. No abnormalities.  Anterior urethra: No abnormalities.  Bladder: Visulization was clear. The ureteral orifices were in the normal anatomic position bilaterally. Examination of the bladder demonstrated mild trabeculation cellules. (She has erythema with some low papillary changes circumferentially around the bladder neck extending 1-2cm into the bladder that is worrisome for CIS and recurrent BT. ) The patient tolerated the procedure well.  Complications: None.    Assessment 1. History of  Bladder Cancer V10.51 2. Microscopic Hematuria 599.72 3. Urine Tests Nonspecific Abnormal Findings 791.9 4. 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   Plan PMH: Bladder Cancer (V10.51)  1. UA With REFLEX  Done: 27Jun2014 10:18AM 2. URINE CYTOLOGY W/REFLEX FISH  Requested for: 27Jun2014   Urine cytology with reflex today.  I am going to get her set up for outpatient cysto with Retrogrades, biopsy and fulguration. She will need to be cleared by Dr. Juanetta Gosling and I think she needs to be done at [facility] because of her pulmonary issues.  I reviewed the risks of bleeding, infection, bladder injury, thrombotic events and anesthetic complications.   Discussion/Summary  CC: Dr. Shaune Pollack.

## 2013-06-19 ENCOUNTER — Ambulatory Visit (HOSPITAL_COMMUNITY)
Admission: RE | Admit: 2013-06-19 | Discharge: 2013-06-19 | Disposition: A | Discharge status: Home / Self Care | Payer: Medicare Other | Source: Ambulatory Visit | Attending: Urology | Admitting: Urology

## 2013-06-19 ENCOUNTER — Encounter (HOSPITAL_COMMUNITY): Payer: Self-pay | Admitting: *Deleted

## 2013-06-19 ENCOUNTER — Ambulatory Visit (HOSPITAL_COMMUNITY): Payer: Medicare Other | Admitting: Anesthesiology

## 2013-06-19 ENCOUNTER — Encounter (HOSPITAL_COMMUNITY)
Admission: RE | Disposition: A | Discharge status: Home / Self Care | Payer: Self-pay | Source: Ambulatory Visit | Attending: Urology

## 2013-06-19 ENCOUNTER — Encounter (HOSPITAL_COMMUNITY): Payer: Self-pay | Admitting: Anesthesiology

## 2013-06-19 DIAGNOSIS — Z8551 Personal history of malignant neoplasm of bladder: Secondary | ICD-10-CM | POA: Insufficient documentation

## 2013-06-19 DIAGNOSIS — K219 Gastro-esophageal reflux disease without esophagitis: Secondary | ICD-10-CM | POA: Insufficient documentation

## 2013-06-19 DIAGNOSIS — R82998 Other abnormal findings in urine: Secondary | ICD-10-CM | POA: Insufficient documentation

## 2013-06-19 DIAGNOSIS — J449 Chronic obstructive pulmonary disease, unspecified: Secondary | ICD-10-CM | POA: Insufficient documentation

## 2013-06-19 DIAGNOSIS — J4489 Other specified chronic obstructive pulmonary disease: Secondary | ICD-10-CM | POA: Insufficient documentation

## 2013-06-19 DIAGNOSIS — Z7982 Long term (current) use of aspirin: Secondary | ICD-10-CM | POA: Insufficient documentation

## 2013-06-19 DIAGNOSIS — M810 Age-related osteoporosis without current pathological fracture: Secondary | ICD-10-CM | POA: Insufficient documentation

## 2013-06-19 DIAGNOSIS — I10 Essential (primary) hypertension: Secondary | ICD-10-CM | POA: Insufficient documentation

## 2013-06-19 DIAGNOSIS — Z79899 Other long term (current) drug therapy: Secondary | ICD-10-CM | POA: Insufficient documentation

## 2013-06-19 DIAGNOSIS — R3129 Other microscopic hematuria: Secondary | ICD-10-CM | POA: Insufficient documentation

## 2013-06-19 HISTORY — PX: CYSTOSCOPY W/ RETROGRADES: SHX1426

## 2013-06-19 SURGERY — CYSTOSCOPY, WITH RETROGRADE PYELOGRAM
Anesthesia: General | Laterality: Bilateral | Wound class: Clean Contaminated

## 2013-06-19 MED ORDER — ACETAMINOPHEN 325 MG PO TABS: 650.0000 mg | ORAL_TABLET | ORAL | Status: DC | PRN

## 2013-06-19 MED ORDER — SODIUM CHLORIDE 0.9 % IJ SOLN: 3.0000 mL | INTRAMUSCULAR | Status: DC | PRN

## 2013-06-19 MED ORDER — LACTATED RINGERS IV SOLN
INTRAVENOUS | Status: DC
  Administered 2013-06-19: 10:00:00 via INTRAVENOUS

## 2013-06-19 MED ORDER — FENTANYL CITRATE 0.05 MG/ML IJ SOLN
INTRAMUSCULAR | Status: DC | PRN
  Administered 2013-06-19: 50 ug via INTRAVENOUS

## 2013-06-19 MED ORDER — SODIUM CHLORIDE 0.9 % IJ SOLN: 3.0000 mL | Freq: Two times a day (BID) | INTRAMUSCULAR | Status: DC

## 2013-06-19 MED ORDER — ACETAMINOPHEN 650 MG RE SUPP
650.0000 mg | RECTAL | Status: DC | PRN
  Filled 2013-06-19: qty 1

## 2013-06-19 MED ORDER — PROPOFOL INFUSION 10 MG/ML OPTIME
INTRAVENOUS | Status: DC | PRN
  Administered 2013-06-19: 50 ug/kg/min via INTRAVENOUS

## 2013-06-19 MED ORDER — HYDROCODONE-ACETAMINOPHEN 5-325 MG PO TABS: 1.0000 | ORAL_TABLET | Freq: Four times a day (QID) | ORAL | Status: DC | PRN

## 2013-06-19 MED ORDER — OXYCODONE HCL 5 MG PO TABS: 5.0000 mg | ORAL_TABLET | ORAL | Status: DC | PRN

## 2013-06-19 MED ORDER — CIPROFLOXACIN IN D5W 400 MG/200ML IV SOLN
400.0000 mg | INTRAVENOUS | Status: AC
  Administered 2013-06-19: 400 mg via INTRAVENOUS

## 2013-06-19 MED ORDER — ONDANSETRON HCL 4 MG/2ML IJ SOLN: 4.0000 mg | Freq: Four times a day (QID) | INTRAMUSCULAR | Status: DC | PRN

## 2013-06-19 MED ORDER — CIPROFLOXACIN HCL 250 MG PO TABS: 250.0000 mg | ORAL_TABLET | Freq: Two times a day (BID) | ORAL | Status: AC

## 2013-06-19 MED ORDER — IOHEXOL 300 MG/ML  SOLN
INTRAMUSCULAR | Status: AC
  Filled 2013-06-19: qty 1

## 2013-06-19 MED ORDER — ONDANSETRON HCL 4 MG/2ML IJ SOLN
INTRAMUSCULAR | Status: DC | PRN
  Administered 2013-06-19: 4 mg via INTRAVENOUS

## 2013-06-19 MED ORDER — BUPIVACAINE IN DEXTROSE 0.75-8.25 % IT SOLN
INTRATHECAL | Status: DC | PRN
  Administered 2013-06-19: 1.7 mL via INTRATHECAL

## 2013-06-19 MED ORDER — MIDAZOLAM HCL 5 MG/5ML IJ SOLN
INTRAMUSCULAR | Status: DC | PRN
  Administered 2013-06-19: 2 mg via INTRAVENOUS

## 2013-06-19 MED ORDER — FENTANYL CITRATE 0.05 MG/ML IJ SOLN: 25.0000 ug | INTRAMUSCULAR | Status: DC | PRN

## 2013-06-19 MED ORDER — SODIUM CHLORIDE 0.9 % IV SOLN: 250.0000 mL | INTRAVENOUS | Status: DC | PRN

## 2013-06-19 MED ORDER — PROMETHAZINE HCL 25 MG/ML IJ SOLN: 6.2500 mg | INTRAMUSCULAR | Status: DC | PRN

## 2013-06-19 MED ORDER — PHENAZOPYRIDINE HCL 200 MG PO TABS: 200.0000 mg | ORAL_TABLET | Freq: Three times a day (TID) | ORAL | Status: DC | PRN

## 2013-06-19 MED ORDER — DIATRIZOATE MEGLUMINE 30 % UR SOLN
URETHRAL | Status: DC | PRN
  Administered 2013-06-19: 10 mL

## 2013-06-19 MED ORDER — CIPROFLOXACIN IN D5W 400 MG/200ML IV SOLN
INTRAVENOUS | Status: AC
  Filled 2013-06-19: qty 200

## 2013-06-19 SURGICAL SUPPLY — 14 items
BAG URINE DRAINAGE (UROLOGICAL SUPPLIES) ×1 IMPLANT
BAG URO CATCHER STRL LF (DRAPE) ×2 IMPLANT
CATH FOLEY 2WAY SLVR  5CC 20FR (CATHETERS) ×1
CATH FOLEY 2WAY SLVR 5CC 20FR (CATHETERS) IMPLANT
CATH URET 5FR 28IN OPEN ENDED (CATHETERS) ×2 IMPLANT
CLOTH BEACON ORANGE TIMEOUT ST (SAFETY) ×2 IMPLANT
DRAPE CAMERA CLOSED 9X96 (DRAPES) ×2 IMPLANT
GLOVE SURG SS PI 8.0 STRL IVOR (GLOVE) ×2 IMPLANT
GOWN BRE IMP PREV XXLGXLNG (GOWN DISPOSABLE) ×1 IMPLANT
GOWN STRL REIN XL XLG (GOWN DISPOSABLE) ×2 IMPLANT
LOOPS RESECTOSCOPE DISP (ELECTROSURGICAL) ×1 IMPLANT
MANIFOLD NEPTUNE II (INSTRUMENTS) ×2 IMPLANT
PACK CYSTO (CUSTOM PROCEDURE TRAY) ×2 IMPLANT
TUBING CONNECTING 10 (TUBING) ×2 IMPLANT

## 2013-06-19 NOTE — Progress Notes (Signed)
Pt can feel me touch her mid thigh area but unaware of me touching her from her knee below.

## 2013-06-19 NOTE — Brief Op Note (Signed)
06/19/2013  8:11 AM  PATIENT:  April Pearson  72 y.o. female  PRE-OPERATIVE DIAGNOSIS:  RECURRENT BLADDER CANCER  POST-OPERATIVE DIAGNOSIS:  recurrent bladder cancer  PROCEDURE:  Procedure(s): CYSTOSCOPY WITH BIOSPY AND FULGERATION 3-4cm lesion, BILATERAL RETROGRADE PYELOGRAM (Bilateral)  SURGEON:  Surgeon(s) and Role:    * Anner Crete, MD - Primary  PHYSICIAN ASSISTANT:   ASSISTANTS: none   ANESTHESIA:   spinal  EBL:     BLOOD ADMINISTERED:none  DRAINS: Urinary Catheter (Foley)   LOCAL MEDICATIONS USED:  NONE  SPECIMEN:  Source of Specimen:  bladder biopsies from trigone and right bladder neck.   DISPOSITION OF SPECIMEN:  PATHOLOGY  COUNTS:  YES  TOURNIQUET:  * No tourniquets in log *  DICTATION: .Other Dictation: Dictation Number [id number]  PLAN OF CARE: Discharge to home after PACU  PATIENT DISPOSITION:  PACU - hemodynamically stable.   Delay start of Pharmacological VTE agent (>24hrs) due to surgical blood loss or risk of bleeding: yes

## 2013-06-19 NOTE — Transfer of Care (Signed)
Immediate Anesthesia Transfer of Care Note  Patient: April Pearson  Procedure(s) Performed: Procedure(s): CYSTOSCOPY WITH BIOSPY AND FULGERATION, BILATERAL RETROGRADE PYELOGRAM (Bilateral)  Patient Location: PACU  Anesthesia Type:MAC and Spinal  Level of Consciousness: awake and alert   Airway & Oxygen Therapy: Patient Spontanous Breathing and Patient connected to nasal cannula oxygen  Post-op Assessment: Report given to PACU RN and Post -op Vital signs reviewed and stable  Post vital signs: Reviewed and stable  Complications: No apparent anesthesia complications

## 2013-06-19 NOTE — Anesthesia Preprocedure Evaluation (Addendum)
Anesthesia Evaluation  Patient identified by MRN, date of birth, ID band Patient awake  General Assessment Comment:1. History of  Anxiety (Symptom) 300.00 2. History of  Bladder Cancer V10.51 3. History of  Bladder Cancer V10.51 4. History of  Chronic Obstructive Pulmonary Disease 496 5. History of  Hypertension 401.9 6. History of  Osteoporosis 733.00 7. History of  Vertigo 780.4   Reviewed: Allergy & Precautions, H&P , NPO status , Patient's Chart, lab work & pertinent test results  Airway Mallampati: II TM Distance: >3 FB Neck ROM: Full    Dental no notable dental hx.    Pulmonary shortness of breath, COPD COPD inhaler,  breath sounds clear to auscultation  Pulmonary exam normal       Cardiovascular Exercise Tolerance: Good hypertension, Pt. on medications Rhythm:Regular Rate:Normal     Neuro/Psych PSYCHIATRIC DISORDERS Anxiety Depression negative neurological ROS     GI/Hepatic Neg liver ROS, GERD-  Medicated,  Endo/Other  negative endocrine ROS  Renal/GU negative Renal ROS  negative genitourinary   Musculoskeletal negative musculoskeletal ROS (+)   Abdominal   Peds negative pediatric ROS (+)  Hematology negative hematology ROS (+)   Anesthesia Other Findings   Reproductive/Obstetrics negative OB ROS                           Anesthesia Physical Anesthesia Plan  ASA: III  Anesthesia Plan: Spinal   Post-op Pain Management:    Induction: Intravenous  Airway Management Planned:   Additional Equipment:   Intra-op Plan:   Post-operative Plan:   Informed Consent: I have reviewed the patients History and Physical, chart, labs and discussed the procedure including the risks, benefits and alternatives for the proposed anesthesia with the patient or authorized representative who has indicated his/her understanding and acceptance.   Dental advisory given  Plan Discussed with:  CRNA  Anesthesia Plan Comments: (Discussed risks/benefits of spinal including headache, backache, failure, bleeding, infection, and nerve damage. Patient consents to spinal. Questions answered. Coagulation studies and platelet count acceptable.)       Anesthesia Quick Evaluation

## 2013-06-19 NOTE — Interval H&P Note (Signed)
History and Physical Interval Note:  06/19/2013 7:21 AM  April Pearson  has presented today for surgery, with the diagnosis of RECURRENT BLADDER CANCER  The various methods of treatment have been discussed with the patient and family. After consideration of risks, benefits and other options for treatment, the patient has consented to  Procedure(s): CYSTOSCOPY WITH BIOSPY AND FULGERATION, BILATERAL RETROGRADE PYELOGRAM (Bilateral) as a surgical intervention .  The patient's history has been reviewed, patient examined, no change in status, stable for surgery.  I have reviewed the patient's chart and labs.  Questions were answered to the patient's satisfaction.     [name] J

## 2013-06-19 NOTE — Anesthesia Procedure Notes (Signed)
Spinal  Patient location during procedure: OR Staffing Performed by: anesthesiologist  Preanesthetic Checklist Completed: patient identified, site marked, surgical consent, pre-op evaluation, timeout performed, IV checked, risks and benefits discussed and monitors and equipment checked Spinal Block Patient position: sitting Prep: Betadine Patient monitoring: heart rate, continuous pulse ox and blood pressure Injection technique: single-shot Needle Needle type: Spinocan  Needle gauge: 22 G Needle length: 9 cm Additional Notes Expiration date of kit checked and confirmed. Patient tolerated procedure well, without complications. CSF clear. No paresthesia.

## 2013-06-19 NOTE — Progress Notes (Signed)
Pt now has sensation in both lower extremities and is having tingling in her feet.  Pt tolerating fluids and has no complaints.

## 2013-06-19 NOTE — Op Note (Signed)
NAMESAVANAH, BAYLES              ACCOUNT NO.:  [id number]  MEDICAL RECORD NO.:  [id number]  LOCATION:  [facility]                         FACILITY:  [facility]  PHYSICIAN:  Excell Seltzer. Annabell Howells, M.D.    DATE OF BIRTH:  [birth year]  DATE OF PROCEDURE:  06/19/2013 DATE OF DISCHARGE:                              OPERATIVE REPORT   PROCEDURE: 1. Cystoscopy with bilateral retrograde pyelograms and interpretation. 2. Bladder biopsy with fulguration of 3-4 cm bladder lesion.  SURGEON:  Excell Seltzer. Annabell Howells, M.D.  ANESTHESIA:  Spinal.  DRAINS:  A 20-French Foley catheter.  SPECIMEN:  Bladder biopsies from right trigone and right bladder neck.  BLOOD LOSS:  Minimal.  COMPLICATIONS:  None.  INDICATIONS:  Ms. Critz is a 72 year old white female with a history of high-grade nonmuscle invasive bladder cancer previously treated with resection and BCG who was found on recent followup cystoscopy to have worrisome lesion at the bladder neck and trigone.  It was felt that biopsy and fulguration along with retrograde pyelography was indicated.  FINDINGS OF PROCEDURE:  She was given Cipro.  She was taken to the operating room where spinal anesthetic was induced.  She was placed in lithotomy position.  She was fitted with a PAS hose.  Her perineum and genitalia were prepped with Betadine solution.  She was draped in usual sterile fashion.  Cystoscopy was performed with a 22-French scope and 12-degree lens. Examination revealed no urethral strictures in the proximal urethra particularly on the right at the bladder neck with some erythema and slight cobblestoning mucosa worrisome for CIS.  There was an area of recent hemorrhage on the right bladder neck extending into the trigone. The mucosa of the trigone particularly on the right extending up slightly on the lateral wall and right bladder neck was heaped up and erythematous once again suspicious for carcinoma in situ, with minimal papillary changes that the  ureteral orifice on the right was spared but just barely the left ureteral orifice was unremarkable.  The remainder of the bladder wall mucosa was smooth and pale without tumors or other abnormalities.  Once thorough inspection had been performed, retrograde pyelogram was performed on the right using a 5-French open-end catheter Omnipaque.  The retrograde pyelogram demonstrated a normal ureter and intrarenal collecting system.  The left retrograde pyelogram was then performed in identical fashion.  Left retrograde pyelogram revealed a normal ureter and intrarenal collecting system.  Once retrograde pyelography was completed, the biopsy bridge was placed on the cystoscope and cup biopsies were obtained from the trigone on the right and also the right bladder neck.  I felt the cup biopsies were safer in this situation, then resection biopsy due to the area of involvement.  Once the cup biopsies were obtained, the cystoscope was replaced with a 28-French continuous flow resectoscope sheath, fitted with an Wandra Scot handle with 12-degree lens and standard loop.  The biopsy sites were fulgurated to provide hemostasis and then the remaining abnormal mucosa was then generously fulgurated with care being taken to spare the right ureteral orifice.  The fulguration was done from approximately 11 o'clock slightly into the urethra around to approximately 3 o'clock. The fulguration was carried out just to  the superior trigone medial to the left ureteral orifice and up on to the right anterior bladder neck and slightly on the lateral wall.  An area approximately 2 x 4 cm was fulgurated.  Once the fulguration had been completed, hemostasis was assured.  The scope was removed and a 20-French Foley catheter was inserted.  The balloon was filled with 10 mL sterile fluid and the catheter was placed to straight drainage.  The patient was taken down from lithotomy position.  Her anesthetic was  reversed.  She was moved to recovery room in stable condition.  There were no complications.     Excell Seltzer. Annabell Howells, M.D.     JJW/MEDQ  D:  06/19/2013  T:  06/19/2013  Job:  [id number]

## 2013-06-19 NOTE — Anesthesia Postprocedure Evaluation (Signed)
  Anesthesia Post-op Note  Patient: April Pearson  Procedure(s) Performed: Procedure(s) (LRB): CYSTOSCOPY WITH BIOSPY AND FULGERATION, BILATERAL RETROGRADE PYELOGRAM (Bilateral)  Patient Location: PACU  Anesthesia Type: Spinal  Level of Consciousness: awake and alert   Airway and Oxygen Therapy: Patient Spontanous Breathing  Post-op Pain: mild  Post-op Assessment: Post-op Vital signs reviewed, Patient's Cardiovascular Status Stable, Respiratory Function Stable, Patent Airway and No signs of Nausea or vomiting  Last Vitals:  Filed Vitals:   06/19/13 0845  BP: 118/46  Pulse: 70  Temp:   Resp: 14    Post-op Vital Signs: stable   Complications: No apparent anesthesia complications. Moving both legs.

## 2013-06-20 ENCOUNTER — Encounter (HOSPITAL_COMMUNITY): Payer: Self-pay | Admitting: Urology

## 2013-06-27 ENCOUNTER — Ambulatory Visit (INDEPENDENT_AMBULATORY_CARE_PROVIDER_SITE_OTHER): Payer: Medicare Other | Admitting: Urology

## 2013-06-27 DIAGNOSIS — Z8551 Personal history of malignant neoplasm of bladder: Secondary | ICD-10-CM

## 2013-08-12 ENCOUNTER — Other Ambulatory Visit (HOSPITAL_COMMUNITY): Payer: Self-pay | Admitting: Pulmonary Disease

## 2013-08-12 DIAGNOSIS — Z139 Encounter for screening, unspecified: Secondary | ICD-10-CM

## 2013-08-19 ENCOUNTER — Ambulatory Visit (HOSPITAL_COMMUNITY)
Admission: RE | Admit: 2013-08-19 | Discharge: 2013-08-19 | Disposition: A | Discharge status: Home / Self Care | Payer: Medicare Other | Source: Ambulatory Visit | Attending: Pulmonary Disease | Admitting: Pulmonary Disease

## 2013-08-19 DIAGNOSIS — Z1231 Encounter for screening mammogram for malignant neoplasm of breast: Secondary | ICD-10-CM | POA: Insufficient documentation

## 2013-08-19 DIAGNOSIS — Z139 Encounter for screening, unspecified: Secondary | ICD-10-CM

## 2013-09-26 ENCOUNTER — Ambulatory Visit (INDEPENDENT_AMBULATORY_CARE_PROVIDER_SITE_OTHER): Payer: Medicare Other | Admitting: Urology

## 2013-09-26 DIAGNOSIS — Z8551 Personal history of malignant neoplasm of bladder: Secondary | ICD-10-CM

## 2013-09-26 DIAGNOSIS — C675 Malignant neoplasm of bladder neck: Secondary | ICD-10-CM

## 2013-10-24 ENCOUNTER — Other Ambulatory Visit: Payer: Self-pay | Admitting: Urology

## 2013-11-10 ENCOUNTER — Encounter (HOSPITAL_COMMUNITY): Payer: Self-pay | Admitting: Pharmacy Technician

## 2013-11-11 ENCOUNTER — Encounter (HOSPITAL_COMMUNITY): Payer: Self-pay | Admitting: Pharmacy Technician

## 2013-11-13 HISTORY — PX: BLADDER SURGERY: SHX569

## 2013-11-17 ENCOUNTER — Encounter (HOSPITAL_COMMUNITY): Payer: Self-pay

## 2013-11-17 ENCOUNTER — Encounter (INDEPENDENT_AMBULATORY_CARE_PROVIDER_SITE_OTHER): Payer: Self-pay

## 2013-11-17 ENCOUNTER — Encounter (HOSPITAL_COMMUNITY)
Admission: RE | Admit: 2013-11-17 | Discharge: 2013-11-17 | Disposition: A | Discharge status: Home / Self Care | Payer: PRIVATE HEALTH INSURANCE | Source: Ambulatory Visit | Attending: Urology | Admitting: Urology

## 2013-11-17 DIAGNOSIS — Z01812 Encounter for preprocedural laboratory examination: Secondary | ICD-10-CM | POA: Insufficient documentation

## 2013-11-17 DIAGNOSIS — Z01818 Encounter for other preprocedural examination: Secondary | ICD-10-CM | POA: Insufficient documentation

## 2013-11-17 HISTORY — DX: Angina pectoris, unspecified: I20.9

## 2013-11-17 HISTORY — DX: Personal history of other infectious and parasitic diseases: Z86.19

## 2013-11-17 HISTORY — DX: Emphysema, unspecified: J43.9

## 2013-11-17 HISTORY — DX: Acute laryngitis: J04.0

## 2013-11-17 LAB — CBC
HEMATOCRIT: 36 % (ref 36.0–46.0)
HEMOGLOBIN: 11.3 g/dL — AB (ref 12.0–15.0)
MCH: 29.4 pg (ref 26.0–34.0)
MCHC: 31.4 g/dL (ref 30.0–36.0)
MCV: 93.8 fL (ref 78.0–100.0)
Platelets: 211 10*3/uL (ref 150–400)
RBC: 3.84 MIL/uL — ABNORMAL LOW (ref 3.87–5.11)
RDW: 15.5 % (ref 11.5–15.5)
WBC: 8.9 10*3/uL (ref 4.0–10.5)

## 2013-11-17 LAB — BASIC METABOLIC PANEL
BUN: 15 mg/dL (ref 6–23)
CHLORIDE: 97 meq/L (ref 96–112)
CO2: 37 mEq/L — ABNORMAL HIGH (ref 19–32)
Calcium: 10.3 mg/dL (ref 8.4–10.5)
Creatinine, Ser: 0.83 mg/dL (ref 0.50–1.10)
GFR calc non Af Amer: 69 mL/min — ABNORMAL LOW (ref >90)
GFR, EST AFRICAN AMERICAN: 80 mL/min — AB (ref >90)
GLUCOSE: 112 mg/dL — AB (ref 70–99)
POTASSIUM: 4.3 meq/L (ref 3.7–5.3)
Sodium: 143 mEq/L (ref 137–147)

## 2013-11-17 NOTE — Patient Instructions (Signed)
[place]  11/17/2013   Your procedure is scheduled on: 11/20/13  Report to [facility] at 5:30 AM.  Call this number if you have problems the morning of surgery [phone]: [phone]   Remember:   Do not eat food or drink liquids After Midnight.     Take these medicines the morning of surgery with A SIP OF WATER: albuterol, diltiazem, prozac, flonase, advair, hydrocodone if needed, spiriva   Do not wear jewelry, make-up or nail polish.  Do not wear lotions, powders, or perfumes. You may wear deodorant.  Do not shave 48 hours prior to surgery. Men may shave face and neck.  Do not bring valuables to the hospital.  Contacts, dentures or bridgework may not be worn into surgery.   Patients discharged the day of surgery will not be allowed to drive home.  Name and phone number of your driver: [phone]   Paulette Blanch, RN  pre op nurse call if needed [phone]    FAILURE TO [place] OF YOUR SURGERY   Patient Signature: ___________________________________________

## 2013-11-17 NOTE — Progress Notes (Signed)
LOV note 10/28/13 Dr. Luan Pulling on chart, Chest x-ray 06/13/13 on [facility], EKG 06/22/13 on [facility]

## 2013-11-18 ENCOUNTER — Emergency Department (HOSPITAL_COMMUNITY): Payer: PRIVATE HEALTH INSURANCE

## 2013-11-18 ENCOUNTER — Emergency Department (HOSPITAL_COMMUNITY)
Admission: EM | Admit: 2013-11-18 | Discharge: 2013-11-18 | Disposition: A | Discharge status: Home / Self Care | Payer: PRIVATE HEALTH INSURANCE | Attending: Emergency Medicine | Admitting: Emergency Medicine

## 2013-11-18 ENCOUNTER — Encounter (HOSPITAL_COMMUNITY): Payer: Self-pay | Admitting: Emergency Medicine

## 2013-11-18 DIAGNOSIS — Z7982 Long term (current) use of aspirin: Secondary | ICD-10-CM | POA: Insufficient documentation

## 2013-11-18 DIAGNOSIS — Z8719 Personal history of other diseases of the digestive system: Secondary | ICD-10-CM | POA: Insufficient documentation

## 2013-11-18 DIAGNOSIS — I10 Essential (primary) hypertension: Secondary | ICD-10-CM | POA: Insufficient documentation

## 2013-11-18 DIAGNOSIS — Z79899 Other long term (current) drug therapy: Secondary | ICD-10-CM | POA: Insufficient documentation

## 2013-11-18 DIAGNOSIS — M1710 Unilateral primary osteoarthritis, unspecified knee: Secondary | ICD-10-CM | POA: Insufficient documentation

## 2013-11-18 DIAGNOSIS — J189 Pneumonia, unspecified organism: Secondary | ICD-10-CM

## 2013-11-18 DIAGNOSIS — M542 Cervicalgia: Secondary | ICD-10-CM | POA: Insufficient documentation

## 2013-11-18 DIAGNOSIS — H9209 Otalgia, unspecified ear: Secondary | ICD-10-CM | POA: Insufficient documentation

## 2013-11-18 DIAGNOSIS — F3289 Other specified depressive episodes: Secondary | ICD-10-CM | POA: Insufficient documentation

## 2013-11-18 DIAGNOSIS — J441 Chronic obstructive pulmonary disease with (acute) exacerbation: Secondary | ICD-10-CM | POA: Insufficient documentation

## 2013-11-18 DIAGNOSIS — J159 Unspecified bacterial pneumonia: Secondary | ICD-10-CM | POA: Insufficient documentation

## 2013-11-18 DIAGNOSIS — Z8551 Personal history of malignant neoplasm of bladder: Secondary | ICD-10-CM | POA: Insufficient documentation

## 2013-11-18 DIAGNOSIS — Z8619 Personal history of other infectious and parasitic diseases: Secondary | ICD-10-CM | POA: Insufficient documentation

## 2013-11-18 DIAGNOSIS — M19049 Primary osteoarthritis, unspecified hand: Secondary | ICD-10-CM | POA: Insufficient documentation

## 2013-11-18 DIAGNOSIS — F411 Generalized anxiety disorder: Secondary | ICD-10-CM | POA: Insufficient documentation

## 2013-11-18 DIAGNOSIS — F329 Major depressive disorder, single episode, unspecified: Secondary | ICD-10-CM | POA: Insufficient documentation

## 2013-11-18 DIAGNOSIS — R1011 Right upper quadrant pain: Secondary | ICD-10-CM | POA: Insufficient documentation

## 2013-11-18 DIAGNOSIS — Z87891 Personal history of nicotine dependence: Secondary | ICD-10-CM | POA: Insufficient documentation

## 2013-11-18 DIAGNOSIS — Z9981 Dependence on supplemental oxygen: Secondary | ICD-10-CM | POA: Insufficient documentation

## 2013-11-18 DIAGNOSIS — IMO0002 Reserved for concepts with insufficient information to code with codable children: Secondary | ICD-10-CM | POA: Insufficient documentation

## 2013-11-18 LAB — COMPREHENSIVE METABOLIC PANEL
ALK PHOS: 84 U/L (ref 39–117)
ALT: 22 U/L (ref 0–35)
AST: 16 U/L (ref 0–37)
Albumin: 3.2 g/dL — ABNORMAL LOW (ref 3.5–5.2)
BUN: 18 mg/dL (ref 6–23)
CO2: 32 mEq/L (ref 19–32)
Calcium: 9.5 mg/dL (ref 8.4–10.5)
Chloride: 96 mEq/L (ref 96–112)
Creatinine, Ser: 0.84 mg/dL (ref 0.50–1.10)
GFR calc non Af Amer: 68 mL/min — ABNORMAL LOW (ref >90)
GFR, EST AFRICAN AMERICAN: 79 mL/min — AB (ref >90)
GLUCOSE: 126 mg/dL — AB (ref 70–99)
POTASSIUM: 3.4 meq/L — AB (ref 3.7–5.3)
Sodium: 140 mEq/L (ref 137–147)
Total Bilirubin: 0.2 mg/dL — ABNORMAL LOW (ref 0.3–1.2)
Total Protein: 6.7 g/dL (ref 6.0–8.3)

## 2013-11-18 LAB — URINALYSIS, ROUTINE W REFLEX MICROSCOPIC
Bilirubin Urine: NEGATIVE
Glucose, UA: NEGATIVE mg/dL
KETONES UR: NEGATIVE mg/dL
LEUKOCYTES UA: NEGATIVE
NITRITE: NEGATIVE
PH: 5.5 (ref 5.0–8.0)
Specific Gravity, Urine: 1.02 (ref 1.005–1.030)
Urobilinogen, UA: 0.2 mg/dL (ref 0.0–1.0)

## 2013-11-18 LAB — CBC WITH DIFFERENTIAL/PLATELET
BASOS ABS: 0 10*3/uL (ref 0.0–0.1)
Basophils Relative: 0 % (ref 0–1)
EOS ABS: 0 10*3/uL (ref 0.0–0.7)
Eosinophils Relative: 0 % (ref 0–5)
HCT: 33.4 % — ABNORMAL LOW (ref 36.0–46.0)
Hemoglobin: 10.8 g/dL — ABNORMAL LOW (ref 12.0–15.0)
Lymphocytes Relative: 3 % — ABNORMAL LOW (ref 12–46)
Lymphs Abs: 0.6 10*3/uL — ABNORMAL LOW (ref 0.7–4.0)
MCH: 30 pg (ref 26.0–34.0)
MCHC: 32.3 g/dL (ref 30.0–36.0)
MCV: 92.8 fL (ref 78.0–100.0)
MONOS PCT: 10 % (ref 3–12)
Monocytes Absolute: 2 10*3/uL — ABNORMAL HIGH (ref 0.1–1.0)
NEUTROS ABS: 17.5 10*3/uL — AB (ref 1.7–7.7)
Neutrophils Relative %: 87 % — ABNORMAL HIGH (ref 43–77)
Platelets: ADEQUATE 10*3/uL (ref 150–400)
RBC: 3.6 MIL/uL — ABNORMAL LOW (ref 3.87–5.11)
RDW: 15.5 % (ref 11.5–15.5)
Smear Review: ADEQUATE
WBC: 20.1 10*3/uL — ABNORMAL HIGH (ref 4.0–10.5)

## 2013-11-18 LAB — URINE MICROSCOPIC-ADD ON

## 2013-11-18 LAB — LACTIC ACID, PLASMA: LACTIC ACID, VENOUS: 1.9 mmol/L (ref 0.5–2.2)

## 2013-11-18 MED ORDER — ONDANSETRON HCL 4 MG/2ML IJ SOLN
4.0000 mg | Freq: Once | INTRAMUSCULAR | Status: AC
  Administered 2013-11-18: 4 mg via INTRAVENOUS
  Filled 2013-11-18: qty 2

## 2013-11-18 MED ORDER — SODIUM CHLORIDE 0.9 % IV BOLUS (SEPSIS)
250.0000 mL | Freq: Once | INTRAVENOUS | Status: AC
  Administered 2013-11-18: 250 mL via INTRAVENOUS

## 2013-11-18 MED ORDER — LEVOFLOXACIN 750 MG PO TABS: 750.0000 mg | ORAL_TABLET | Freq: Every day | ORAL | Status: DC

## 2013-11-18 MED ORDER — SODIUM CHLORIDE 0.9 % IV SOLN
INTRAVENOUS | Status: DC
  Administered 2013-11-18: 20:00:00 via INTRAVENOUS

## 2013-11-18 MED ORDER — LEVOFLOXACIN IN D5W 750 MG/150ML IV SOLN
750.0000 mg | Freq: Once | INTRAVENOUS | Status: AC
Start: 2013-11-18 — End: 2013-11-18
  Administered 2013-11-18 (×2): 750 mg via INTRAVENOUS
  Filled 2013-11-18: qty 150

## 2013-11-18 NOTE — ED Notes (Signed)
Pt reports left side headache and earache. Denies any fever/chills, wears 02 2l/ around the clock.

## 2013-11-18 NOTE — Discharge Instructions (Signed)
Continue to use your oxygen. Take the antibiotic daily for your pneumonia. Recommend followup with your doctor in the next few days if not better. 4 recheck. Also followup with your bladder cancer Dr. as scheduled. Return for any new or worse symptoms at all. You should be improving in a couple days.

## 2013-11-18 NOTE — ED Provider Notes (Addendum)
CSN: [id number]     Arrival date & time 11/18/13  1304 History  This chart was scribed for Mervin Kung, MD by Donato Schultz, ED Scribe. This patient was seen in room APA06/APA06 and the patient's care was started at 4:59 PM.     Chief Complaint  Patient presents with  . Otalgia  . Headache    Patient is a 73 y.o. female presenting with ear pain, headaches, abdominal pain, and chest pain. The history is provided by the patient. No language interpreter was used.  Otalgia Location:  Left Severity:  Moderate Onset quality:  Sudden Duration:  8 hours Timing:  Constant Chronicity:  New Associated symptoms: abdominal pain (RUQ), congestion, cough (non-productive ), headaches and neck pain   Associated symptoms: no diarrhea, no fever, no rash and no vomiting   Abdominal pain:    Location:  RUQ   Quality:  Rossi   Severity:  Moderate   Onset quality:  Sudden   Duration:  2 days   Timing:  Constant Cough:    Cough characteristics:  Non-productive   Severity:  Severe   Duration:  2 days   Timing:  Constant   Chronicity:  New Headaches:    Severity:  Moderate   Onset quality:  Sudden   Duration:  2 days   Timing:  Constant   Chronicity:  New Headache Pain location:  L parietal and L temporal Associated symptoms: abdominal pain (RUQ), congestion, cough (non-productive ), ear pain (left side) and neck pain   Associated symptoms: no back pain, no diarrhea, no fever, no nausea and no vomiting   Abdominal Pain Pain location:  RUQ Pain quality: [place]   Pain severity:  Severe Timing:  Constant Chronicity:  New Worsened by:  Movement Associated symptoms: chest pain, chills, cough (non-productive ) and shortness of breath   Associated symptoms: no diarrhea, no dysuria, no fever, no nausea and no vomiting   Chest pain:    Severity:  Severe   Timing:  Constant Chest Pain Pain location:  L chest Pain radiates to:  Does not radiate Pain severity:  Severe Timing:   Constant Worsened by:  Movement Associated symptoms: abdominal pain (RUQ), cough (non-productive ), headache and shortness of breath   Associated symptoms: no back pain, no fever, no nausea and not vomiting    HPI Comments: April Pearson is a 73 y.o. female with a history of Emphysema and bladder cancer who presents to the Emergency Department complaining of constant congestion, right upper quadrant abdominal pain, aching left sided head pain, and bilateral otalgia.  The patient states that her congestion started two days ago and her otalgia and head pain started this morning when she woke up.  She rates her chest pain at a 3/10 and her head pain at a 2/10.  The patient states that she uses 2L of oxygen at home.    The patient denies taking anticoagulants.    The patient's PCP is Dr. Luan Pulling.    Past Medical History  Diagnosis Date  . COPD (chronic obstructive pulmonary disease)   . Hypertension   . Osteoporosis   . Anxiety   . Weakness   . Tachycardia   . Cancer     bladder  . Depression   . Arthritis     KNEES AND HANDS  . Coarse tremors     TREMORS BOTH HANDS - PT STATES SIDE EFFECT OF HER MEDICATIONS FOR HER BREATHING  . Dizziness     sometimes  .  Shortness of breath     USES OXYGEN 2 L / MIN NASAL CANNUA   24 HRS A DAY; LIVES IN [name]--CAN AMBULATE SHORT DISTANCE BUT ACTIVITIES VERY LIMITED BY SOB  . Anginal pain     "with fluttering, seen MD about it"  . History of shingles   . Emphysema   . GERD (gastroesophageal reflux disease)     sometimes  . Laryngitis     10/2013, hx of   Past Surgical History  Procedure Laterality Date  . Bladder surgery    . Cystoscopy w/ retrogrades Bilateral 06/19/2013    Procedure: CYSTOSCOPY WITH BIOSPY AND FULGERATION, BILATERAL RETROGRADE PYELOGRAM;  Surgeon: Malka So, MD;  Location: [facility];  Service: Urology;  Laterality: Bilateral;  . Breast surgery Left     FOR TUMOR - BENIGN  .  Cataract extraction Bilateral 3 years ago   No family history on file. History  Substance Use Topics  . Smoking status: Former Smoker -- 2.00 packs/day for 40 years    Types: Cigarettes    Quit date: 11/13/2000  . Smokeless tobacco: Never Used     Comment: QUIT SMOKING YRS AGO  . Alcohol Use: No   OB History   Grav Para Term Preterm Abortions TAB SAB Ect Mult Living                 Review of Systems  Constitutional: Positive for chills. Negative for fever.  HENT: Positive for congestion and ear pain (left side).   Eyes: Negative for visual disturbance.  Respiratory: Positive for cough (non-productive ) and shortness of breath.   Cardiovascular: Positive for chest pain. Negative for leg swelling.  Gastrointestinal: Positive for abdominal pain (RUQ). Negative for nausea, vomiting and diarrhea.  Genitourinary: Negative for dysuria.  Musculoskeletal: Positive for neck pain. Negative for back pain.  Skin: Negative for rash.  Neurological: Positive for headaches.  Hematological: Does not bruise/bleed easily.  Psychiatric/Behavioral: Negative for confusion.  All other systems reviewed and are negative.    Allergies  Review of patient's allergies indicates no known allergies.  Home Medications   Current Outpatient Rx  Name  Route  Sig  Dispense  Refill  . acetaminophen (TYLENOL) 325 MG tablet   Oral   Take 325-650 mg by mouth 4 (four) times daily as needed for mild pain or moderate pain.          Marland Kitchen albuterol (PROVENTIL) (2.5 MG/3ML) 0.083% nebulizer solution   Nebulization   Take 2.5 mg by nebulization 3 (three) times daily.         Marland Kitchen alendronate (FOSAMAX) 70 MG tablet   Oral   Take 70 mg by mouth every Friday. Take with a full glass of water on an empty stomach.         . ALPRAZolam (XANAX) 1 MG tablet   Oral   Take 1 mg by mouth at bedtime.         Marland Kitchen aspirin EC 81 MG tablet   Oral   Take 81 mg by mouth daily.         . calcium-vitamin D (OSCAL WITH D)  500-200 MG-UNIT per tablet   Oral   Take 1 tablet by mouth 2 (two) times daily.         . cetirizine (ZYRTEC) 10 MG tablet   Oral   Take 10 mg by mouth at bedtime.         Marland Kitchen dextromethorphan-guaiFENesin (ROBITUSSIN-DM) 10-100  MG/5ML liquid   Oral   Take 10 mLs by mouth every 4 (four) hours as needed for cough.         . diltiazem (TIAZAC) 240 MG 24 hr capsule   Oral   Take 240 mg by mouth every morning.          . ferrous sulfate 325 (65 FE) MG tablet   Oral   Take 325 mg by mouth daily.         Marland Kitchen FLUoxetine (PROZAC) 20 MG tablet   Oral   Take 20 mg by mouth every morning.          . fluticasone (FLONASE) 50 MCG/ACT nasal spray   Nasal   Place 2 sprays into the nose daily.         . Fluticasone-Salmeterol (ADVAIR) 250-50 MCG/DOSE AEPB   Inhalation   Inhale 1 puff into the lungs every 12 (twelve) hours.         . furosemide (LASIX) 40 MG tablet   Oral   Take 40 mg by mouth daily.         Marland Kitchen HYDROcodone-acetaminophen (NORCO/VICODIN) 5-325 MG per tablet   Oral   Take 1 tablet by mouth every 6 (six) hours as needed for moderate pain or severe pain.         . Multiple Vitamins-Minerals (HCA SUPER THERAVITE-M PO)   Oral   Take 1 tablet by mouth daily.         [place] (OYSTER CALCIUM) 500 MG TABS tablet   Oral   Take 500 mg of elemental calcium by mouth 2 (two) times daily.         . risperiDONE (RISPERDAL) 0.5 MG tablet   Oral   Take 0.5 mg by mouth at bedtime.         Marland Kitchen tiotropium (SPIRIVA) 18 MCG inhalation capsule   Inhalation   Place 18 mcg into inhaler and inhale every morning.          Triage Vitals: BP 118/54  Pulse 86  Temp(Src) 98.1 F (36.7 C) (Oral)  Resp 22  Ht 5\' 4"  (1.626 m)  Wt 164 lb (74.39 kg)  BMI 28.14 kg/m2  SpO2 99%  Physical Exam  Nursing note and vitals reviewed. Constitutional: She is oriented to person, place, and time. She appears well-developed and well-nourished.  HENT:  Head: Normocephalic  and atraumatic.  Right Ear: Tympanic membrane, external ear and ear canal normal.  Left Ear: Tympanic membrane, external ear and ear canal normal.  Mouth/Throat: Mucous membranes are normal.  Eyes: Conjunctivae and EOM are normal. Pupils are equal, round, and reactive to light.  Neck: Normal range of motion and phonation normal. Neck supple.  Cardiovascular: Normal rate, regular rhythm, normal heart sounds and intact distal pulses.   No murmur heard. Pulmonary/Chest: Effort normal and breath sounds normal. She exhibits tenderness (lower ribs on left side).  Abdominal: Soft. Bowel sounds are normal. There is no tenderness (RUQ).  Musculoskeletal: Normal range of motion. She exhibits no edema.  Neurological: She is alert and oriented to person, place, and time. No cranial nerve deficit. She exhibits normal muscle tone. Coordination normal.  Skin: Skin is warm and dry.  Psychiatric: She has a normal mood and affect. Her behavior is normal. Judgment and thought content normal.    ED Course  Procedures (including critical care time)  DIAGNOSTIC STUDIES: Oxygen Saturation is 99% on 2L oxygen and normal by my interpretation.    COORDINATION OF  CARE: 5:07 PM- Discussed a clinical suspicion of pressure in the tympanic membrane.  Discussed obtaining a chest x-ray and CAT scan of the patient's head.  The patient agreed to the treatment plan.    Labs Review Labs Reviewed  CBC WITH DIFFERENTIAL - Abnormal; Notable for the following:    WBC 20.1 (*)    RBC 3.60 (*)    Hemoglobin 10.8 (*)    HCT 33.4 (*)    Neutrophils Relative % 87 (*)    Lymphocytes Relative 3 (*)    Neutro Abs 17.5 (*)    Lymphs Abs 0.6 (*)    Monocytes Absolute 2.0 (*)    All other components within normal limits  COMPREHENSIVE METABOLIC PANEL - Abnormal; Notable for the following:    Potassium 3.4 (*)    Glucose, Bld 126 (*)    Albumin 3.2 (*)    Total Bilirubin 0.2 (*)    GFR calc non Af Amer 68 (*)    GFR calc Af  Amer 79 (*)    All other components within normal limits  URINALYSIS, ROUTINE W REFLEX MICROSCOPIC - Abnormal; Notable for the following:    Hgb urine dipstick LARGE (*)    Protein, ur TRACE (*)    All other components within normal limits  URINE MICROSCOPIC-ADD ON - Abnormal; Notable for the following:    Squamous Epithelial / LPF MANY (*)    Bacteria, UA FEW (*)    Casts HYALINE CASTS (*)    All other components within normal limits  URINE CULTURE  LACTIC ACID, PLASMA   Results for orders placed during the hospital encounter of 11/18/13  CBC WITH DIFFERENTIAL      Result Value Range   WBC 20.1 (*) 4.0 - 10.5 K/uL   RBC 3.60 (*) 3.87 - 5.11 MIL/uL   Hemoglobin 10.8 (*) 12.0 - 15.0 g/dL   HCT 33.4 (*) 36.0 - 46.0 %   MCV 92.8  78.0 - 100.0 fL   MCH 30.0  26.0 - 34.0 pg   MCHC 32.3  30.0 - 36.0 g/dL   RDW 15.5  11.5 - 15.5 %   Platelets    150 - 400 K/uL   Value: PLATELET CLUMPS NOTED ON SMEAR, COUNT APPEARS ADEQUATE   Neutrophils Relative % 87 (*) 43 - 77 %   Lymphocytes Relative 3 (*) 12 - 46 %   Monocytes Relative 10  3 - 12 %   Eosinophils Relative 0  0 - 5 %   Basophils Relative 0  0 - 1 %   Neutro Abs 17.5 (*) 1.7 - 7.7 K/uL   Lymphs Abs 0.6 (*) 0.7 - 4.0 K/uL   Monocytes Absolute 2.0 (*) 0.1 - 1.0 K/uL   Eosinophils Absolute 0.0  0.0 - 0.7 K/uL   Basophils Absolute 0.0  0.0 - 0.1 K/uL   WBC Morphology TOXIC GRANULATION     Smear Review       Value: PLATELET CLUMPS NOTED ON SMEAR, COUNT APPEARS ADEQUATE  COMPREHENSIVE METABOLIC PANEL      Result Value Range   Sodium 140  137 - 147 mEq/L   Potassium 3.4 (*) 3.7 - 5.3 mEq/L   Chloride 96  96 - 112 mEq/L   CO2 32  19 - 32 mEq/L   Glucose, Bld 126 (*) 70 - 99 mg/dL   BUN 18  6 - 23 mg/dL   Creatinine, Ser 0.84  0.50 - 1.10 mg/dL   Calcium 9.5  8.4 - 10.5  mg/dL   Total Protein 6.7  6.0 - 8.3 g/dL   Albumin 3.2 (*) 3.5 - 5.2 g/dL   AST 16  0 - 37 U/L   ALT 22  0 - 35 U/L   Alkaline Phosphatase 84  39 - 117 U/L    Total Bilirubin 0.2 (*) 0.3 - 1.2 mg/dL   GFR calc non Af Amer 68 (*) >90 mL/min   GFR calc Af Amer 79 (*) >90 mL/min  URINALYSIS, ROUTINE W REFLEX MICROSCOPIC      Result Value Range   Color, Urine YELLOW  YELLOW   APPearance CLEAR  CLEAR   Specific Gravity, Urine 1.020  1.005 - 1.030   pH 5.5  5.0 - 8.0   Glucose, UA NEGATIVE  NEGATIVE mg/dL   Hgb urine dipstick LARGE (*) NEGATIVE   Bilirubin Urine NEGATIVE  NEGATIVE   Ketones, ur NEGATIVE  NEGATIVE mg/dL   Protein, ur TRACE (*) NEGATIVE mg/dL   Urobilinogen, UA 0.2  0.0 - 1.0 mg/dL   Nitrite NEGATIVE  NEGATIVE   Leukocytes, UA NEGATIVE  NEGATIVE  LACTIC ACID, PLASMA      Result Value Range   Lactic Acid, Venous 1.9  0.5 - 2.2 mmol/L  URINE MICROSCOPIC-ADD ON      Result Value Range   Squamous Epithelial / LPF MANY (*) RARE   WBC, UA 3-6  <3 WBC/hpf   RBC / HPF TOO NUMEROUS TO COUNT  <3 RBC/hpf   Bacteria, UA FEW (*) RARE   Casts HYALINE CASTS (*) NEGATIVE    Imaging Review Dg Chest 1 View  11/18/2013   CLINICAL DATA:  Chest pain, abdominal pain, headache, earache for 8 hr, history COPD, hypertension, bladder cancer, former smoker  EXAM: CHEST - 1 VIEW  COMPARISON:  06/13/2013  FINDINGS: Normal heart size, mediastinal contours, and pulmonary vascularity.  Atherosclerotic calcification aorta.  COPD changes with minimal infiltrate at lateral right lung base.  Remaining lungs clear.  No pleural effusion or pneumothorax.  Bones demineralized with old fracture of the lateral right 7th rib.  IMPRESSION: COPD changes with minimal right basilar infiltrate.   Electronically Signed   By: Lavonia Dana M.D.   On: 11/18/2013 19:38   Ct Head Wo Contrast  11/18/2013   CLINICAL DATA:  Potentially, headache, history of posterior osteoma  EXAM: CT HEAD WITHOUT CONTRAST  TECHNIQUE: Contiguous axial images were obtained from the base of the skull through the vertex without intravenous contrast.  COMPARISON:  01/03/2007  FINDINGS: Mild atrophy.  Normal  ventricular morphology.  No midline shift or mass effect.  Normal appearance of brain parenchyma otherwise seen.  No intracranial hemorrhage, mass lesion or evidence acute infarction.  No extra-axial fluid collections.  Again identified marked thickening of the posterior right calvarium, area in question 7.5 cm transverse, 2.9 cm thick, extending 7.2 cm in craniocaudal length, question large exostosis.  Hyperostosis frontalis interna again noted.  Atherosclerotic calcifications at the internal carotid arteries at the skullbase.  Diffuse osseous demineralization.  IMPRESSION: No acute intracranial abnormalities.  Large area of calcification at the posterior skull contiguous with the posterior right parietal bone, question large exostosis.   Electronically Signed   By: Lavonia Dana M.D.   On: 11/18/2013 19:27   Ct Chest Wo Contrast  11/18/2013   CLINICAL DATA:  Elevated white blood cell count, COPD and tachycardia.  EXAM: CT CHEST WITHOUT CONTRAST  TECHNIQUE: Multidetector CT imaging of the chest was performed following the standard protocol without IV contrast.  COMPARISON:  Chest x-ray earlier today and CTA of the chest on 10/28/2006. Marland Kitchen  FINDINGS: Patchy airspace disease at the right lung base is most likely consistent with pneumonia and was not present on the prior CT in 2007. No associated pleural fluid is identified. There is no evidence of pulmonary edema, mass or pneumothorax. No enlarged lymph nodes are identified. Atherosclerotic calcifications are seen involving the aortic arch. No evidence of aortic aneurysm in the chest. The heart size is normal. Calcified plaque is seen in the distribution of the LAD.  Bony structures show a mild compression deformity of the L1 vertebral body with superior endplate compression an approximately 30% loss of height.  IMPRESSION: 1. Patchy infiltrate at the right lung base most likely representing pneumonia. 2. 30% compression of the L1 vertebral body. 3. Coronary  atherosclerosis with calcified plaque in the distribution of the LAD.   Electronically Signed   By: Aletta Edouard M.D.   On: 11/18/2013 20:53    EKG Interpretation   None       MDM   1. CAP (community acquired pneumonia)    Patient with history of COPD. Never been hypoxic here. On her normal 2 L of oxygen she satting 99%. Patient chest x-ray was suspicious for pneumonia CT scan confirms that. Patient will be started on Levaquin here. Urinalysis is pending and lactic acid is pending. Patient states she's from a nursing facility that are not familiar with the name and that will have nurses check to see. Patient is a candidate currently for going back of the lactic acid is normal. Patient is nontoxic in no acute distress. Symptoms not consistent with influenza.  I personally performed the services described in this documentation, which was scribed in my presence. The recorded information has been reviewed and is accurate.     Mervin Kung, MD 11/18/13 2113   Patient's urinalysis too numerous to count reds however no she does have a history of bladder cancer. However this flexion of antibiotic Levaquin should take care of any urinary tract infection. We'll have the nurse check with her nursing facility. Patient is stable for discharge back and outpatient treatment for the pneumonia. Will verify that have the capability take care of her. Patient's lactic acid is not elevated no evidence of sepsis or pre-sepsis.     Mervin Kung, MD 11/18/13 2124   Contacted the patient's assisted living supervisor they're willing to help administer the antibiotics and I agree that it would be better to have her they're taking her antibiotics and will watch after her period they will bring her back for any new or worse symptoms.  Mervin Kung, MD 11/18/13 2138

## 2013-11-20 ENCOUNTER — Ambulatory Visit (HOSPITAL_COMMUNITY): Admission: RE | Admit: 2013-11-20 | Payer: PRIVATE HEALTH INSURANCE | Source: Ambulatory Visit | Admitting: Urology

## 2013-11-20 ENCOUNTER — Encounter (HOSPITAL_COMMUNITY): Admission: RE | Payer: Self-pay | Source: Ambulatory Visit

## 2013-11-20 LAB — URINE CULTURE
COLONY COUNT: NO GROWTH
Culture: NO GROWTH

## 2013-11-20 SURGERY — CYSTOSCOPY/RETROGRADE/URETEROSCOPY
Anesthesia: Choice

## 2013-12-09 ENCOUNTER — Ambulatory Visit (HOSPITAL_COMMUNITY)
Admission: RE | Admit: 2013-12-09 | Discharge: 2013-12-09 | Disposition: A | Discharge status: Home / Self Care | Payer: PRIVATE HEALTH INSURANCE | Source: Ambulatory Visit | Attending: Pulmonary Disease | Admitting: Pulmonary Disease

## 2013-12-09 ENCOUNTER — Other Ambulatory Visit (HOSPITAL_COMMUNITY): Payer: Self-pay | Admitting: Pulmonary Disease

## 2013-12-09 DIAGNOSIS — J189 Pneumonia, unspecified organism: Secondary | ICD-10-CM | POA: Insufficient documentation

## 2013-12-09 DIAGNOSIS — I10 Essential (primary) hypertension: Secondary | ICD-10-CM | POA: Insufficient documentation

## 2013-12-09 DIAGNOSIS — J4489 Other specified chronic obstructive pulmonary disease: Secondary | ICD-10-CM | POA: Insufficient documentation

## 2013-12-09 DIAGNOSIS — R918 Other nonspecific abnormal finding of lung field: Secondary | ICD-10-CM | POA: Insufficient documentation

## 2013-12-09 DIAGNOSIS — Z01818 Encounter for other preprocedural examination: Secondary | ICD-10-CM | POA: Insufficient documentation

## 2013-12-09 DIAGNOSIS — J449 Chronic obstructive pulmonary disease, unspecified: Secondary | ICD-10-CM | POA: Insufficient documentation

## 2013-12-10 ENCOUNTER — Other Ambulatory Visit: Payer: Self-pay | Admitting: Urology

## 2013-12-16 ENCOUNTER — Ambulatory Visit (INDEPENDENT_AMBULATORY_CARE_PROVIDER_SITE_OTHER): Payer: PRIVATE HEALTH INSURANCE | Admitting: Urology

## 2013-12-16 ENCOUNTER — Ambulatory Visit: Payer: PRIVATE HEALTH INSURANCE | Admitting: Urology

## 2013-12-16 DIAGNOSIS — Z8551 Personal history of malignant neoplasm of bladder: Secondary | ICD-10-CM

## 2013-12-16 DIAGNOSIS — C675 Malignant neoplasm of bladder neck: Secondary | ICD-10-CM

## 2013-12-19 ENCOUNTER — Encounter (HOSPITAL_COMMUNITY): Payer: Self-pay | Admitting: Pharmacy Technician

## 2013-12-22 ENCOUNTER — Encounter (INDEPENDENT_AMBULATORY_CARE_PROVIDER_SITE_OTHER): Payer: Self-pay

## 2013-12-22 ENCOUNTER — Encounter (HOSPITAL_COMMUNITY)
Admission: RE | Admit: 2013-12-22 | Discharge: 2013-12-22 | Disposition: A | Discharge status: Home / Self Care | Payer: PRIVATE HEALTH INSURANCE | Source: Ambulatory Visit | Attending: Urology | Admitting: Urology

## 2013-12-22 ENCOUNTER — Encounter (HOSPITAL_COMMUNITY): Payer: Self-pay

## 2013-12-22 HISTORY — DX: Pneumonia, unspecified organism: J18.9

## 2013-12-22 HISTORY — DX: Bipolar disorder, unspecified: F31.9

## 2013-12-22 LAB — BASIC METABOLIC PANEL
BUN: 14 mg/dL (ref 6–23)
CO2: 36 mEq/L — ABNORMAL HIGH (ref 19–32)
Calcium: 9.9 mg/dL (ref 8.4–10.5)
Chloride: 99 mEq/L (ref 96–112)
Creatinine, Ser: 0.76 mg/dL (ref 0.50–1.10)
GFR calc Af Amer: >90 mL/min (ref >90)
GFR, EST NON AFRICAN AMERICAN: 82 mL/min — AB (ref >90)
Glucose, Bld: 113 mg/dL — ABNORMAL HIGH (ref 70–99)
Potassium: 3.9 mEq/L (ref 3.7–5.3)
SODIUM: 146 meq/L (ref 137–147)

## 2013-12-22 LAB — CBC
HCT: 33.6 % — ABNORMAL LOW (ref 36.0–46.0)
Hemoglobin: 10.7 g/dL — ABNORMAL LOW (ref 12.0–15.0)
MCH: 29.3 pg (ref 26.0–34.0)
MCHC: 31.8 g/dL (ref 30.0–36.0)
MCV: 92.1 fL (ref 78.0–100.0)
PLATELETS: 323 10*3/uL (ref 150–400)
RBC: 3.65 MIL/uL — ABNORMAL LOW (ref 3.87–5.11)
RDW: 15.6 % — ABNORMAL HIGH (ref 11.5–15.5)
WBC: 7 10*3/uL (ref 4.0–10.5)

## 2013-12-22 NOTE — Progress Notes (Signed)
Chest x-ray 12/09/13 on EPIC, EKG 06/13/13 on EPIC

## 2013-12-22 NOTE — Patient Instructions (Addendum)
[place]  12/22/2013   Your procedure is scheduled on: 12/25/13  Report to [facility] at 05:30 AM.  Call this number if you have problems the morning of surgery [phone]: [phone]   Remember: please bring inhalers on day of surgery   Do not eat food or drink liquids After Midnight.     Take these medicines the morning of surgery with A SIP OF WATER: albuterol, advair, spiriva, diltiazem, fluoxetine/prozac, flonase   Do not wear jewelry, make-up or nail polish.  Do not wear lotions, powders, or perfumes. You may wear deodorant.  Do not shave 48 hours prior to surgery. Men may shave face and neck.  Do not bring valuables to the hospital.  Contacts, dentures or bridgework may not be worn into surgery.     Patients discharged the day of surgery will not be allowed to drive home.  Name and phone number of your driver: [phone] [facility] [phone] April Pearson   Please read over the following fact sheets that you were given:[place] preparing for surgery sheet Paulette Blanch, RN  pre op nurse call if needed [phone]    FAILURE TO [place]   Patient Signature: ___________________________________________

## 2013-12-24 NOTE — H&P (Signed)
tive Problems  1. Abnormal urine findings (791.9)  2. History of bladder cancer (V10.51)  3. 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 Microscopic hematuria (599.72)  History of Present Illness  April Pearson returns today for her history of bladder cancer that was resected by Dr. Glendell Docker in 12/11.  She has received both induction and maintenance BCG and got her last round in January.   She had cytologic atypia at that time.  She had a negative CT in 10/13.   Her tumor was HG NMIBC.  She has had no gross hematuria or associated signs of symptoms.  She had a cystoscopy with RTG's and bladder biopsy on 06/19/13 that showed only post treatment inflammation.   Past Medical History  1. History of Anxiety (300.00)  2. History of Chronic obstructive pulmonary disease (496)  3. History of bladder cancer (V10.51)  4. History of hypertension (V12.59)  5. History of vertigo (V12.49)  6. History of Osteoporosis (733.00)  7. Personal history of bladder cancer (V10.51)  Surgical History  1. History of Bladder Surgery  2. History of Bladder Surgery  3. History of Breast Surgery Lumpectomy  4. History of Cystoscopy With Fulguration Medium Lesion (2-5cm)  Current Meds  1. Acetaminophen 325 MG Oral Tablet;  Therapy: (Recorded:21Jun2013) to Recorded  2. Advair Diskus 250-50 MCG/DOSE Inhalation Aerosol Powder Breath Activated;  Therapy: (Recorded:21Jun2013) to Recorded  3. Albuterol Sulfate NEBU;  Therapy: (Recorded:21Jun2013) to Recorded  4. Alendronate Sodium 70 MG Oral Tablet;  Therapy: (Recorded:21Jun2013) to Recorded  5. ALPRAZolam 1 MG Oral Tablet;  Therapy: (Recorded:21Jun2013) to Recorded  6. Aspirin 81 MG Oral Tablet;  Therapy: (Recorded:21Jun2013) to Recorded  7. Calcium 500 + D TABS;  Therapy: (Recorded:21Jun2013) to Recorded  8. Diltiazem HCl ER 240 MG Oral Capsule Extended Release 24 Hour;  Therapy: (Recorded:21Jun2013) to Recorded  9. Ferrous Sulfate TABS;  Therapy:  (Recorded:21Jun2013) to Recorded  10. FLUoxetine HCl - 20 MG Oral Capsule;   Therapy: (Recorded:21Jun2013) to Recorded  11. Fluticasone Propionate 50 MCG/ACT Nasal Suspension;   Therapy: (Recorded:21Jun2013) to Recorded  12. Furosemide 40 MG Oral Tablet;   Therapy: (Recorded:21Jun2013) to Recorded  13. Meclizine HCl - 25 MG Oral Tablet;   Therapy: (Recorded:21Jun2013) to Recorded  14. Multi-Vitamin TABS;   Therapy: (Recorded:21Jun2013) to Recorded  15. Oxygen Use;   Therapy: (Recorded:27Sep2013) to Recorded  16. RisperiDONE TABS;   Therapy: (Recorded:21Jun2013) to Recorded  17. Spiriva HandiHaler 18 MCG Inhalation Capsule;   Therapy: (Recorded:15Aug2014) to Recorded  18. Tussionex Pennkinetic ER 10-8 MG/5ML Oral Liquid Extended Release;   Therapy: (Recorded:21Jun2013) to Recorded  Allergies  1. No Known Drug Allergies  Family History  1. Family history of Carcinoma Of The Stomach (V16.0)  2. Family history of Death In The Family Father  3. Family history of Death In The Family Mother  4. Family history of Hypertension (V17.49)  5. Family history of Interstitial Emphysema  6. Family history of Renal Disease  Social History  1. Former smoker (V15.82)   2 pks p/d for 35 yrs quit 7 yrs  2. Marital History - Divorced (V61.03)  3. Never Drank Alcohol  4. Occupation: Retired  Review of Systems  Genitourinary: no hematuria.  Gastrointestinal: no flank pain.  Constitutional: no fever and no recent weight loss.  Respiratory: shortness of breath.    Vitals Vital Signs [Data Includes: Last 1 Day]  Recorded: [id number] 11:00AM  Blood Pressure: 150 / 55 Temperature: 98.81 F Heart Rate: 91  Physical Exam Constitutional: Well  nourished and well developed . No acute distress.  Pulmonary: No respiratory distress and normal respiratory rhythm and effort. Decreased breath sounds heard on ascultation.  Cardiovascular: Heart rate and rhythm are normal . No peripheral edema.     Results/Data Urine [Data Includes: Last 1 Day]   [id number]  COLOR STRAW   APPEARANCE CLEAR   SPECIFIC GRAVITY <1.005   pH 8.0   GLUCOSE NEG mg/dL  BILIRUBIN NEG   KETONE NEG mg/dL  BLOOD MOD   PROTEIN NEG mg/dL  UROBILINOGEN 0.2 mg/dL  NITRITE NEG   LEUKOCYTE ESTERASE NEG   SQUAMOUS EPITHELIAL/HPF RARE   WBC 0-2 WBC/hpf  RBC 0-2 RBC/hpf  BACTERIA NONE SEEN   CRYSTALS NONE SEEN   CASTS NONE SEEN    Procedure  Procedure: Cystoscopy   Indication: History of Urothelial Carcinoma.  Informed Consent: Risks, benefits, and potential adverse events were discussed and informed consent was obtained from the patient.  Prep: The patient was prepped with betadine.  Procedure Note:  Urethral meatus:. No abnormalities.  Anterior urethra: No abnormalities.  Bladder: Visulization was clear. The ureteral orifices were in the normal anatomic position bilaterally and had clear efflux of urine. The mucosa was smooth without abnormalities. (She has some cobblestoning with papillary changes at the right anterior bladder neck and left anterior bladder neck with some associated bleeding. ) A saline bladder washing was obtained and sent for cytologic analysis. The patient tolerated the procedure well.  Complications: None.    Assessment  1. History of bladder cancer (V10.51)  2. 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   Plan Malignant neoplasm of bladder neck   1. URINE CYTOLOGY; Status:Hold For - Specimen/Data Collection,Appointment;  Requested for:14Nov2014;   2. Follow-up Schedule Surgery Office  Follow-up  Status: Hold For - Appointment   Requested for: [id number] Microscopic hematuria   3. UA With REFLEX; Status:Resulted - Requires Verification;   Done: [id number] 11:02AM   I will set her up for cystoscopy with bil RTG's and biopsy with fulguration.  The risks were reviewed. I will get her cleared by Dr. Luan Pulling.    Discussion/Summary   CC: Dr. Velvet Bathe.    Her preop culture is clear.

## 2013-12-24 NOTE — Pre-Procedure Instructions (Signed)
OFFICE NOTE OF MEDICAL CLEARANCE RECEIVED FROM DR. HAWKINS DATED 12/23/13 VISIT - NOTE ON PT'S CHART.

## 2013-12-24 NOTE — Anesthesia Preprocedure Evaluation (Addendum)
Anesthesia Evaluation  Patient identified by MRN, date of birth, ID band Patient awake    Reviewed: Allergy & Precautions, H&P , NPO status , Patient's Chart, lab work & pertinent test results  Airway Mallampati: II TM Distance: >3 FB Neck ROM: full    Dental  (+) Edentulous Upper, Edentulous Lower   Pulmonary shortness of breath and with exertion, COPD oxygen dependent, former smoker,  O2 sat is 100% on nasal cannula oxygen.  No respiratory distress today. breath sounds clear to auscultation  Pulmonary exam normal       Cardiovascular Exercise Tolerance: Good hypertension, Pt. on medications + angina Rhythm:regular Rate:Normal  Fluttering and tachycardia.  Had CP in past but not recently.   Neuro/Psych PSYCHIATRIC DISORDERS Anxiety Depression Bipolar Disorder negative neurological ROS  negative psych ROS   GI/Hepatic negative GI ROS, Neg liver ROS,   Endo/Other  negative endocrine ROS  Renal/GU negative Renal ROS  negative genitourinary   Musculoskeletal   Abdominal   Peds  Hematology negative hematology ROS (+) anemia , hgb 10.7   Anesthesia Other Findings   Reproductive/Obstetrics negative OB ROS                         Anesthesia Physical Anesthesia Plan  ASA: III  Anesthesia Plan: General   Post-op Pain Management:    Induction: Intravenous  Airway Management Planned: LMA  Additional Equipment:   Intra-op Plan:   Post-operative Plan:   Informed Consent: I have reviewed the patients History and Physical, chart, labs and discussed the procedure including the risks, benefits and alternatives for the proposed anesthesia with the patient or authorized representative who has indicated his/her understanding and acceptance.   Dental Advisory Given  Plan Discussed with: CRNA and Surgeon  Anesthesia Plan Comments:         Anesthesia Quick Evaluation

## 2013-12-25 ENCOUNTER — Encounter (HOSPITAL_COMMUNITY)
Admission: RE | Disposition: A | Discharge status: Nursing Home (Includes ICF) | Payer: Self-pay | Source: Ambulatory Visit | Attending: Urology

## 2013-12-25 ENCOUNTER — Ambulatory Visit (HOSPITAL_COMMUNITY): Payer: PRIVATE HEALTH INSURANCE | Admitting: Anesthesiology

## 2013-12-25 ENCOUNTER — Encounter (HOSPITAL_COMMUNITY): Payer: Self-pay | Admitting: *Deleted

## 2013-12-25 ENCOUNTER — Ambulatory Visit (HOSPITAL_COMMUNITY)
Admission: RE | Admit: 2013-12-25 | Discharge: 2013-12-25 | Disposition: A | Discharge status: Nursing Home (Includes ICF) | Payer: PRIVATE HEALTH INSURANCE | Source: Ambulatory Visit | Attending: Urology | Admitting: Urology

## 2013-12-25 ENCOUNTER — Encounter (HOSPITAL_COMMUNITY): Payer: PRIVATE HEALTH INSURANCE | Admitting: Anesthesiology

## 2013-12-25 DIAGNOSIS — Z87891 Personal history of nicotine dependence: Secondary | ICD-10-CM | POA: Insufficient documentation

## 2013-12-25 DIAGNOSIS — D649 Anemia, unspecified: Secondary | ICD-10-CM | POA: Insufficient documentation

## 2013-12-25 DIAGNOSIS — M810 Age-related osteoporosis without current pathological fracture: Secondary | ICD-10-CM | POA: Insufficient documentation

## 2013-12-25 DIAGNOSIS — R3129 Other microscopic hematuria: Secondary | ICD-10-CM | POA: Insufficient documentation

## 2013-12-25 DIAGNOSIS — N3289 Other specified disorders of bladder: Secondary | ICD-10-CM | POA: Insufficient documentation

## 2013-12-25 DIAGNOSIS — J4489 Other specified chronic obstructive pulmonary disease: Secondary | ICD-10-CM | POA: Insufficient documentation

## 2013-12-25 DIAGNOSIS — Z01812 Encounter for preprocedural laboratory examination: Secondary | ICD-10-CM | POA: Insufficient documentation

## 2013-12-25 DIAGNOSIS — J449 Chronic obstructive pulmonary disease, unspecified: Secondary | ICD-10-CM | POA: Insufficient documentation

## 2013-12-25 DIAGNOSIS — D090 Carcinoma in situ of bladder: Secondary | ICD-10-CM | POA: Insufficient documentation

## 2013-12-25 DIAGNOSIS — Z79899 Other long term (current) drug therapy: Secondary | ICD-10-CM | POA: Insufficient documentation

## 2013-12-25 DIAGNOSIS — F411 Generalized anxiety disorder: Secondary | ICD-10-CM | POA: Insufficient documentation

## 2013-12-25 DIAGNOSIS — I10 Essential (primary) hypertension: Secondary | ICD-10-CM | POA: Insufficient documentation

## 2013-12-25 HISTORY — PX: CYSTOSCOPY W/ RETROGRADES: SHX1426

## 2013-12-25 HISTORY — PX: TRANSURETHRAL RESECTION OF BLADDER TUMOR: SHX2575

## 2013-12-25 SURGERY — CYSTOSCOPY, WITH RETROGRADE PYELOGRAM
Anesthesia: General | Site: Bladder | Laterality: Bilateral

## 2013-12-25 MED ORDER — LIDOCAINE HCL (CARDIAC) 20 MG/ML IV SOLN
INTRAVENOUS | Status: AC
  Filled 2013-12-25: qty 5

## 2013-12-25 MED ORDER — MIDAZOLAM HCL 2 MG/2ML IJ SOLN
INTRAMUSCULAR | Status: AC
  Filled 2013-12-25: qty 2

## 2013-12-25 MED ORDER — ACETAMINOPHEN 650 MG RE SUPP
650.0000 mg | RECTAL | Status: DC | PRN
Start: 2013-12-25 — End: 2013-12-25
  Filled 2013-12-25: qty 1

## 2013-12-25 MED ORDER — CIPROFLOXACIN IN D5W 200 MG/100ML IV SOLN
200.0000 mg | Freq: Two times a day (BID) | INTRAVENOUS | Status: DC
  Filled 2013-12-25: qty 100

## 2013-12-25 MED ORDER — ONDANSETRON HCL 4 MG/2ML IJ SOLN
INTRAMUSCULAR | Status: AC
  Filled 2013-12-25: qty 2

## 2013-12-25 MED ORDER — ONDANSETRON HCL 4 MG/2ML IJ SOLN: 4.0000 mg | Freq: Four times a day (QID) | INTRAMUSCULAR | Status: DC | PRN

## 2013-12-25 MED ORDER — FENTANYL CITRATE 0.05 MG/ML IJ SOLN
INTRAMUSCULAR | Status: AC
  Filled 2013-12-25: qty 5

## 2013-12-25 MED ORDER — LIDOCAINE HCL (CARDIAC) 20 MG/ML IV SOLN
INTRAVENOUS | Status: DC | PRN
  Administered 2013-12-25: 50 mg via INTRAVENOUS

## 2013-12-25 MED ORDER — SODIUM CHLORIDE 0.9 % IJ SOLN: 3.0000 mL | Freq: Two times a day (BID) | INTRAMUSCULAR | Status: DC

## 2013-12-25 MED ORDER — EPHEDRINE SULFATE 50 MG/ML IJ SOLN
INTRAMUSCULAR | Status: DC | PRN
  Administered 2013-12-25 (×2): 10 mg via INTRAVENOUS

## 2013-12-25 MED ORDER — LACTATED RINGERS IV SOLN: INTRAVENOUS | Status: DC

## 2013-12-25 MED ORDER — OXYCODONE HCL 5 MG PO TABS: 5.0000 mg | ORAL_TABLET | ORAL | Status: DC | PRN

## 2013-12-25 MED ORDER — FENTANYL CITRATE 0.05 MG/ML IJ SOLN: 25.0000 ug | INTRAMUSCULAR | Status: DC | PRN

## 2013-12-25 MED ORDER — PHENYLEPHRINE HCL 10 MG/ML IJ SOLN
INTRAMUSCULAR | Status: DC | PRN
  Administered 2013-12-25 (×5): 80 ug via INTRAVENOUS

## 2013-12-25 MED ORDER — PHENYLEPHRINE 40 MCG/ML (10ML) SYRINGE FOR IV PUSH (FOR BLOOD PRESSURE SUPPORT)
PREFILLED_SYRINGE | INTRAVENOUS | Status: AC
  Filled 2013-12-25: qty 20

## 2013-12-25 MED ORDER — PROPOFOL 10 MG/ML IV BOLUS
INTRAVENOUS | Status: DC | PRN
  Administered 2013-12-25: 130 mg via INTRAVENOUS

## 2013-12-25 MED ORDER — ALBUTEROL SULFATE HFA 108 (90 BASE) MCG/ACT IN AERS
INHALATION_SPRAY | RESPIRATORY_TRACT | Status: DC | PRN
  Administered 2013-12-25: 2 via RESPIRATORY_TRACT

## 2013-12-25 MED ORDER — LACTATED RINGERS IV SOLN
INTRAVENOUS | Status: DC | PRN
  Administered 2013-12-25 (×2): via INTRAVENOUS

## 2013-12-25 MED ORDER — IOHEXOL 300 MG/ML  SOLN
INTRAMUSCULAR | Status: DC | PRN
  Administered 2013-12-25: 17 mL

## 2013-12-25 MED ORDER — PROPOFOL 10 MG/ML IV BOLUS
INTRAVENOUS | Status: AC
Start: 2013-12-25 — End: 2013-12-25
  Filled 2013-12-25: qty 20

## 2013-12-25 MED ORDER — LIDOCAINE HCL 2 % EX GEL
CUTANEOUS | Status: AC
  Filled 2013-12-25: qty 10

## 2013-12-25 MED ORDER — CIPROFLOXACIN IN D5W 400 MG/200ML IV SOLN: 400.0000 mg | INTRAVENOUS | Status: DC

## 2013-12-25 MED ORDER — ACETAMINOPHEN 325 MG PO TABS: 650.0000 mg | ORAL_TABLET | ORAL | Status: DC | PRN

## 2013-12-25 MED ORDER — FENTANYL CITRATE 0.05 MG/ML IJ SOLN
INTRAMUSCULAR | Status: DC | PRN
  Administered 2013-12-25: 50 ug via INTRAVENOUS
  Administered 2013-12-25: 25 ug via INTRAVENOUS
  Administered 2013-12-25: 50 ug via INTRAVENOUS
  Administered 2013-12-25: 25 ug via INTRAVENOUS

## 2013-12-25 MED ORDER — SODIUM CHLORIDE 0.9 % IJ SOLN: 3.0000 mL | INTRAMUSCULAR | Status: DC | PRN

## 2013-12-25 MED ORDER — ONDANSETRON HCL 4 MG/2ML IJ SOLN
INTRAMUSCULAR | Status: DC | PRN
  Administered 2013-12-25: 4 mg via INTRAVENOUS

## 2013-12-25 MED ORDER — MIDAZOLAM HCL 5 MG/5ML IJ SOLN
INTRAMUSCULAR | Status: DC | PRN
  Administered 2013-12-25: 1 mg via INTRAVENOUS

## 2013-12-25 MED ORDER — CIPROFLOXACIN HCL 250 MG PO TABS: 250.0000 mg | ORAL_TABLET | Freq: Two times a day (BID) | ORAL | Status: DC

## 2013-12-25 MED ORDER — TRAMADOL HCL 50 MG PO TABS: 50.0000 mg | ORAL_TABLET | Freq: Four times a day (QID) | ORAL | Status: DC | PRN

## 2013-12-25 MED ORDER — CIPROFLOXACIN IN D5W 200 MG/100ML IV SOLN
200.0000 mg | Freq: Once | INTRAVENOUS | Status: AC
  Administered 2013-12-25: 200 mg via INTRAVENOUS
  Filled 2013-12-25: qty 100

## 2013-12-25 MED ORDER — ALBUTEROL SULFATE HFA 108 (90 BASE) MCG/ACT IN AERS
INHALATION_SPRAY | RESPIRATORY_TRACT | Status: AC
  Filled 2013-12-25: qty 6.7

## 2013-12-25 MED ORDER — SODIUM CHLORIDE 0.9 % IV SOLN
250.0000 mL | INTRAVENOUS | Status: DC | PRN
Start: 2013-12-25 — End: 2013-12-25

## 2013-12-25 SURGICAL SUPPLY — 25 items
BAG URINE DRAINAGE (UROLOGICAL SUPPLIES) ×2 IMPLANT
BAG URO CATCHER STRL LF (DRAPE) ×3 IMPLANT
CATH FOLEY 2WAY SLVR  5CC 20FR (CATHETERS) ×2
CATH FOLEY 2WAY SLVR 5CC 20FR (CATHETERS) IMPLANT
CATH FOLEY 3WAY 30CC 22FR (CATHETERS) IMPLANT
CATH URET 5FR 28IN OPEN ENDED (CATHETERS) IMPLANT
DRAPE CAMERA CLOSED 9X96 (DRAPES) ×3 IMPLANT
ELECT BUTTON HF 24-28F 2 30DE (ELECTRODE) ×1 IMPLANT
ELECT LOOP MED HF 24F 12D (CUTTING LOOP) ×3 IMPLANT
ELECT LOOP MED HF 24F 12D CBL (CLIP) ×1 IMPLANT
ELECT RESECT VAPORIZE 12D CBL (ELECTRODE) ×3 IMPLANT
GLOVE SURG SS PI 8.0 STRL IVOR (GLOVE) ×4 IMPLANT
GOWN STRL REUS W/TWL XL LVL3 (GOWN DISPOSABLE) ×5 IMPLANT
HOLDER FOLEY CATH W/STRAP (MISCELLANEOUS) ×2 IMPLANT
IV NS 1000ML (IV SOLUTION) ×12
IV NS 1000ML BAXH (IV SOLUTION) IMPLANT
KIT ASPIRATION TUBING (SET/KITS/TRAYS/PACK) ×1 IMPLANT
MANIFOLD NEPTUNE II (INSTRUMENTS) ×3 IMPLANT
NS IRRIG 1000ML POUR BTL (IV SOLUTION) ×2 IMPLANT
PACK CYSTO (CUSTOM PROCEDURE TRAY) ×3 IMPLANT
SUT ETHILON 3 0 PS 1 (SUTURE) IMPLANT
SYR 30ML LL (SYRINGE) IMPLANT
SYRINGE IRR TOOMEY STRL 70CC (SYRINGE) IMPLANT
TUBING CONNECTING 10 (TUBING) ×2 IMPLANT
TUBING CONNECTING 10' (TUBING) ×1

## 2013-12-25 NOTE — Anesthesia Postprocedure Evaluation (Signed)
  Anesthesia Post-op Note  Patient: April Pearson  Procedure(s) Performed: Procedure(s) (LRB): CYSTOSCOPY WITH BILATERAL RETROGRADE WITH BIOPSY WITH FULGERATION (Bilateral) TRANSURETHRAL RESECTION OF BLADDER TUMOR (TURBT) (Bilateral)  Patient Location: PACU  Anesthesia Type: General  Level of Consciousness: awake and alert   Airway and Oxygen Therapy: Patient Spontanous Breathing  Post-op Pain: mild  Post-op Assessment: Post-op Vital signs reviewed, Patient's Cardiovascular Status Stable, Respiratory Function Stable, Patent Airway and No signs of Nausea or vomiting  Last Vitals:  Filed Vitals:   12/25/13 0930  BP: 125/58  Pulse: 83  Temp: 36.9 C  Resp: 16    Post-op Vital Signs: stable   Complications: No apparent anesthesia complications

## 2013-12-25 NOTE — Progress Notes (Signed)
Add to teaching for discharge instructions have care giver remove foley in am. Per Dr. Jeffie Pollock

## 2013-12-25 NOTE — Brief Op Note (Signed)
12/25/2013  8:19 AM  PATIENT:  April Pearson  73 y.o. female  PRE-OPERATIVE DIAGNOSIS:  bladder neck tumor  POST-OPERATIVE DIAGNOSIS:  bladder neck tumor  PROCEDURE:  Procedure(s): CYSTOSCOPY WITH BILATERAL RETROGRADE WITH BIOPSY WITH FULGERATION (Bilateral) TRANSURETHRAL RESECTION OF BLADDER TUMOR Medium 2-5cm (TURBT) (Bilateral)  SURGEON:  Surgeon(s) and Role:    * Irine Seal, MD - Primary  PHYSICIAN ASSISTANT:   ASSISTANTS: none   ANESTHESIA:   general  EBL:     BLOOD ADMINISTERED:none  DRAINS: Urinary Catheter (Foley)   LOCAL MEDICATIONS USED:  NONE  SPECIMEN:  Source of Specimen:  right anterior bladder neck biopsy and shavings  DISPOSITION OF SPECIMEN:  PATHOLOGY  COUNTS:  YES  TOURNIQUET:  * No tourniquets in log *  DICTATION: .Other Dictation: Dictation Number [id number]  PLAN OF CARE: Discharge to home after PACU  PATIENT DISPOSITION:  PACU - hemodynamically stable.   Delay start of Pharmacological VTE agent (>24hrs) due to surgical blood loss or risk of bleeding: yes

## 2013-12-25 NOTE — Discharge Instructions (Signed)
Cystoscopy, Care After   Refer to this sheet in the next few weeks. These instructions provide you with information on caring for yourself after your procedure. Your caregiver may also give you more specific instructions. Your treatment has been planned according to current medical practices, but problems sometimes occur. Call your caregiver if you have any problems or questions after your procedure.   HOME CARE INSTRUCTIONS   Things you can do to ease any discomfort after your procedure include:   Drinking enough water and fluids to keep your urine clear or pale yellow.   Taking a warm bath to relieve any burning feelings.  SEEK IMMEDIATE MEDICAL CARE IF:   You have an increase in blood in your urine.   You notice blood clots in your urine.   You have difficulty passing urine.   You have the chills.   You have abdominal pain.   You have a fever or persistent symptoms for more than 2 3 days.   You have a fever and your symptoms suddenly get worse.  MAKE SURE YOU:   Understand these instructions.   Will watch your condition.   Will get help right away if you are not doing well or get worse.  Document Released: 05/19/2005 Document Revised: 07/02/2013 Document Reviewed: 04/22/2012   ExitCare Patient Information 2014 [employer]

## 2013-12-25 NOTE — Interval H&P Note (Signed)
History and Physical Interval Note:  Lungs clear. Heart RRR.  No new complaints.   Preop culture is negative.   12/25/2013 7:19 AM  April Pearson  has presented today for surgery, with the diagnosis of bladder neck tumor  The various methods of treatment have been discussed with the patient and family. After consideration of risks, benefits and other options for treatment, the patient has consented to  Procedure(s): CYSTOSCOPY WITH BILATERAL RETROGRADE WITH BIOPSY WITH FULGERATION (Bilateral) TRANSURETHRAL RESECTION OF BLADDER TUMOR (TURBT) (Bilateral) as a surgical intervention .  The patient's history has been reviewed, patient examined, no change in status, stable for surgery.  I have reviewed the patient's chart and labs.  Questions were answered to the patient's satisfaction.     [name] J

## 2013-12-25 NOTE — Transfer of Care (Signed)
Immediate Anesthesia Transfer of Care Note  Patient: April Pearson  Procedure(s) Performed: Procedure(s): CYSTOSCOPY WITH BILATERAL RETROGRADE WITH BIOPSY WITH FULGERATION (Bilateral) TRANSURETHRAL RESECTION OF BLADDER TUMOR (TURBT) (Bilateral)  Patient Location: PACU  Anesthesia Type:General  Level of Consciousness: awake, alert  and oriented  Airway & Oxygen Therapy: Patient Spontanous Breathing and Patient connected to face mask oxygen  Post-op Assessment: Report given to PACU RN and Post -op Vital signs reviewed and stable  Post vital signs: Reviewed and stable  Complications: No apparent anesthesia complications

## 2013-12-25 NOTE — Progress Notes (Signed)
[facility]

## 2013-12-25 NOTE — Progress Notes (Signed)
Dr. Landry Dyke in to see patient.

## 2013-12-25 NOTE — Preoperative (Signed)
Beta Blockers   Reason not to administer Beta Blockers:Not Applicable 

## 2013-12-26 ENCOUNTER — Encounter (HOSPITAL_COMMUNITY): Payer: Self-pay | Admitting: Urology

## 2013-12-26 NOTE — Op Note (Signed)
NAMERAND, BOLLER              ACCOUNT NO.:  [id number]  MEDICAL RECORD NO.:  [id number]  LOCATION:  [facility]                         FACILITY:  [facility]  PHYSICIAN:  Marshall Cork. Jeffie Pollock, M.D.    DATE OF BIRTH:  08-29-1941  DATE OF PROCEDURE:  12/25/2013 DATE OF DISCHARGE:  12/25/2013                              OPERATIVE REPORT   PROCEDURES: 1. Cystoscopy with bilateral retrograde pyelograms with     interpretation. 2. Bladder biopsy. 3. Transurethral resection of medium bladder tumor.  PREOPERATIVE DIAGNOSIS:  Bladder tumor.  POSTOPERATIVE DIAGNOSIS:  Bladder tumor.  SURGEON:  Marshall Cork. Jeffie Pollock, MD  ANESTHESIA:  General.  SPECIMEN:  Cup biopsy from right anterior bladder wall and shavings from right anterior bladder wall.  DRAIN:  20-French Foley catheter.  ESTIMATED BLOOD LOSS:  Minimal.  COMPLICATIONS:  None.  INDICATIONS:  Ms. Popoca is a 73 year old white female with a history of high-grade nonmuscle-invasive bladder cancer, who was found on cystoscopy in November to have evidence of recurrent disease at the right and anterior bladder neck.  She had medical comorbidities that required treatment, and her procedure was delayed several times.  She presents now for cystoscopy, bilateral retrograde pyelograms, biopsy, fulguration, and possible TUR bladder tumor.  FINDINGS OF PROCEDURE:  She was given 200 mg of Cipro.  Preoperative culture was negative.  She was taken to the operating room where general anesthetic was induced.  She was placed in lithotomy position.  Her perineum and genitalia were prepped with Betadine solution.  She was draped in usual sterile fashion.  Cystoscopy was performed using a 22-French scope and 12 and 70 degree lenses.  Examination revealed no urethral strictures.  The bladder wall had mild-to-moderate trabeculation with no cellules or diverticula.  The ureteral orifices were unremarkable.  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  The disease extended into the trigone distal to the ureter on the left in the mid trigone centrally and superior to the trigone onto the right posterior wall and anterior wall.  There were no significant areas of papillary tumor, but some heaped mucosa, most consistent with carcinoma in situ, with the bulk at the right anterior bladder neck.  Once inspection had been performed, bilateral retrograde pyelograms were done using a 5-French open-end catheter and Omnipaque.  Right retrograde pyelogram revealed a normal ureter and intrarenal collecting system.  Left retrograde pyelogram revealed a normal ureter and intrarenal collecting system.  Once retrograde pyelography was performed, a cup biopsy forceps was used to obtain a generous biopsy from one or more bulky areas of mucosal abnormality.  Additional biopsies were to be taken, but some bleeding developed, and I felt that using a loop would be more appropriate.  A 28-French continuous flow resectoscope sheath was inserted.  This was fitted with an [place] handle with a 12-degree lens and bipolar loop with saline as the irrigant.  The biopsy site was fulgurated for hemostasis and two additional shavings were obtained for pathologic analysis.  The abnormal mucosa was then fulgurated generously with the largest patch of  fulguration being approximately 3 x 5 cm on the right lateral wall.  The bladder neck was fulgurated for about 270 degrees with only the very posterior bladder neck not being involved with mucosal abnormality.  I had fulgurated as generously as possible, but did not feel I was able to obliterate all of the abnormal mucosa, but being that this is carcinoma in situ, she will need BCG at a later date to consolidate therapy.  Once fulguration had been completed, final inspection demonstrated  no significant residual areas of abnormality.  No active bleeding.  No bladder wall injury was noted.  No retained clots or tissue was noted. At this point, the bladder was drained and a 20-French Foley catheter was inserted.  The balloon was filled with 10 mL of sterile fluid.  The catheter was irrigated with clear return.  The patient was taken down from lithotomy position.  Her anesthetic was reversed.  She was moved to the recovery room in stable condition.  There were no complications.     Marshall Cork. Jeffie Pollock, M.D.     JJW/MEDQ  D:  12/25/2013  T:  12/26/2013  Job:  [id number]

## 2013-12-29 ENCOUNTER — Emergency Department (HOSPITAL_COMMUNITY): Payer: PRIVATE HEALTH INSURANCE

## 2013-12-29 ENCOUNTER — Emergency Department (HOSPITAL_COMMUNITY)
Admission: EM | Admit: 2013-12-29 | Discharge: 2013-12-29 | Disposition: A | Discharge status: Home / Self Care | Payer: PRIVATE HEALTH INSURANCE | Attending: Emergency Medicine | Admitting: Emergency Medicine

## 2013-12-29 ENCOUNTER — Other Ambulatory Visit: Payer: Self-pay

## 2013-12-29 ENCOUNTER — Encounter (HOSPITAL_COMMUNITY): Payer: Self-pay | Admitting: Emergency Medicine

## 2013-12-29 DIAGNOSIS — IMO0002 Reserved for concepts with insufficient information to code with codable children: Secondary | ICD-10-CM | POA: Insufficient documentation

## 2013-12-29 DIAGNOSIS — Z8701 Personal history of pneumonia (recurrent): Secondary | ICD-10-CM | POA: Insufficient documentation

## 2013-12-29 DIAGNOSIS — Z79899 Other long term (current) drug therapy: Secondary | ICD-10-CM | POA: Insufficient documentation

## 2013-12-29 DIAGNOSIS — J441 Chronic obstructive pulmonary disease with (acute) exacerbation: Secondary | ICD-10-CM | POA: Insufficient documentation

## 2013-12-29 DIAGNOSIS — Z8619 Personal history of other infectious and parasitic diseases: Secondary | ICD-10-CM | POA: Insufficient documentation

## 2013-12-29 DIAGNOSIS — R52 Pain, unspecified: Secondary | ICD-10-CM

## 2013-12-29 DIAGNOSIS — I209 Angina pectoris, unspecified: Secondary | ICD-10-CM | POA: Insufficient documentation

## 2013-12-29 DIAGNOSIS — J069 Acute upper respiratory infection, unspecified: Secondary | ICD-10-CM | POA: Insufficient documentation

## 2013-12-29 DIAGNOSIS — K219 Gastro-esophageal reflux disease without esophagitis: Secondary | ICD-10-CM | POA: Insufficient documentation

## 2013-12-29 DIAGNOSIS — Z7982 Long term (current) use of aspirin: Secondary | ICD-10-CM | POA: Insufficient documentation

## 2013-12-29 DIAGNOSIS — Z87891 Personal history of nicotine dependence: Secondary | ICD-10-CM | POA: Insufficient documentation

## 2013-12-29 DIAGNOSIS — Z9981 Dependence on supplemental oxygen: Secondary | ICD-10-CM | POA: Insufficient documentation

## 2013-12-29 DIAGNOSIS — R42 Dizziness and giddiness: Secondary | ICD-10-CM | POA: Insufficient documentation

## 2013-12-29 DIAGNOSIS — Z8739 Personal history of other diseases of the musculoskeletal system and connective tissue: Secondary | ICD-10-CM | POA: Insufficient documentation

## 2013-12-29 DIAGNOSIS — M1710 Unilateral primary osteoarthritis, unspecified knee: Secondary | ICD-10-CM | POA: Insufficient documentation

## 2013-12-29 DIAGNOSIS — Z8551 Personal history of malignant neoplasm of bladder: Secondary | ICD-10-CM | POA: Insufficient documentation

## 2013-12-29 DIAGNOSIS — R0600 Dyspnea, unspecified: Secondary | ICD-10-CM

## 2013-12-29 DIAGNOSIS — R609 Edema, unspecified: Secondary | ICD-10-CM | POA: Insufficient documentation

## 2013-12-29 DIAGNOSIS — M19049 Primary osteoarthritis, unspecified hand: Secondary | ICD-10-CM | POA: Insufficient documentation

## 2013-12-29 DIAGNOSIS — F411 Generalized anxiety disorder: Secondary | ICD-10-CM | POA: Insufficient documentation

## 2013-12-29 DIAGNOSIS — F319 Bipolar disorder, unspecified: Secondary | ICD-10-CM | POA: Insufficient documentation

## 2013-12-29 LAB — CBC WITH DIFFERENTIAL/PLATELET
Basophils Absolute: 0 K/uL (ref 0.0–0.1)
Basophils Relative: 0 % (ref 0–1)
Eosinophils Absolute: 0 K/uL (ref 0.0–0.7)
Eosinophils Relative: 0 % (ref 0–5)
HCT: 34.9 % — ABNORMAL LOW (ref 36.0–46.0)
Hemoglobin: 11.2 g/dL — ABNORMAL LOW (ref 12.0–15.0)
Lymphocytes Relative: 7 % — ABNORMAL LOW (ref 12–46)
Lymphs Abs: 0.6 K/uL — ABNORMAL LOW (ref 0.7–4.0)
MCH: 29.8 pg (ref 26.0–34.0)
MCHC: 32.1 g/dL (ref 30.0–36.0)
MCV: 92.8 fL (ref 78.0–100.0)
Monocytes Absolute: 0.9 K/uL (ref 0.1–1.0)
Monocytes Relative: 10 % (ref 3–12)
Neutro Abs: 7.3 K/uL (ref 1.7–7.7)
Neutrophils Relative %: 83 % — ABNORMAL HIGH (ref 43–77)
Platelets: 348 K/uL (ref 150–400)
RBC: 3.76 MIL/uL — ABNORMAL LOW (ref 3.87–5.11)
RDW: 15.7 % — ABNORMAL HIGH (ref 11.5–15.5)
WBC: 8.7 K/uL (ref 4.0–10.5)

## 2013-12-29 LAB — BASIC METABOLIC PANEL
BUN: 20 mg/dL (ref 6–23)
CHLORIDE: 92 meq/L — AB (ref 96–112)
CO2: 33 mEq/L — ABNORMAL HIGH (ref 19–32)
Calcium: 10.4 mg/dL (ref 8.4–10.5)
Creatinine, Ser: 0.85 mg/dL (ref 0.50–1.10)
GFR calc non Af Amer: 67 mL/min — ABNORMAL LOW (ref >90)
GFR, EST AFRICAN AMERICAN: 77 mL/min — AB (ref >90)
Glucose, Bld: 115 mg/dL — ABNORMAL HIGH (ref 70–99)
POTASSIUM: 3.8 meq/L (ref 3.7–5.3)
SODIUM: 140 meq/L (ref 137–147)

## 2013-12-29 LAB — TROPONIN I: Troponin I: <0.3 ng/mL

## 2013-12-29 LAB — D-DIMER, QUANTITATIVE (NOT AT ARMC): D DIMER QUANT: 0.95 ug{FEU}/mL — AB (ref 0.00–0.48)

## 2013-12-29 MED ORDER — IOHEXOL 350 MG/ML SOLN
100.0000 mL | Freq: Once | INTRAVENOUS | Status: AC | PRN
  Administered 2013-12-29: 100 mL via INTRAVENOUS

## 2013-12-29 MED ORDER — LEVOFLOXACIN 500 MG PO TABS: 500.0000 mg | ORAL_TABLET | Freq: Every day | ORAL | Status: DC

## 2013-12-29 MED ORDER — IPRATROPIUM-ALBUTEROL 0.5-2.5 (3) MG/3ML IN SOLN
3.0000 mL | Freq: Once | RESPIRATORY_TRACT | Status: AC
  Administered 2013-12-29: 3 mL via RESPIRATORY_TRACT
  Filled 2013-12-29: qty 3

## 2013-12-29 MED ORDER — IPRATROPIUM BROMIDE 0.02 % IN SOLN
0.5000 mg | Freq: Once | RESPIRATORY_TRACT | Status: AC
  Administered 2013-12-29: 0.5 mg via RESPIRATORY_TRACT
  Filled 2013-12-29: qty 2.5

## 2013-12-29 MED ORDER — ALBUTEROL SULFATE (2.5 MG/3ML) 0.083% IN NEBU
5.0000 mg | INHALATION_SOLUTION | Freq: Once | RESPIRATORY_TRACT | Status: AC
  Administered 2013-12-29: 5 mg via RESPIRATORY_TRACT
  Filled 2013-12-29: qty 6

## 2013-12-29 MED ORDER — IOHEXOL 350 MG/ML SOLN
80.0000 mL | Freq: Once | INTRAVENOUS | Status: AC | PRN
  Administered 2013-12-29: 80 mL via INTRAVENOUS

## 2013-12-29 MED ORDER — LEVOFLOXACIN 750 MG PO TABS
750.0000 mg | ORAL_TABLET | Freq: Once | ORAL | Status: AC
  Administered 2013-12-29: 750 mg via ORAL
  Filled 2013-12-29: qty 1

## 2013-12-29 MED ORDER — OSELTAMIVIR PHOSPHATE 75 MG PO CAPS
75.0000 mg | ORAL_CAPSULE | Freq: Once | ORAL | Status: AC
  Administered 2013-12-29: 75 mg via ORAL
  Filled 2013-12-29: qty 1

## 2013-12-29 MED ORDER — OSELTAMIVIR PHOSPHATE 75 MG PO CAPS: 75.0000 mg | ORAL_CAPSULE | Freq: Two times a day (BID) | ORAL | Status: DC

## 2013-12-29 MED ORDER — METHYLPREDNISOLONE SODIUM SUCC 125 MG IJ SOLR
125.0000 mg | Freq: Once | INTRAMUSCULAR | Status: AC
  Administered 2013-12-29: 125 mg via INTRAVENOUS
  Filled 2013-12-29: qty 2

## 2013-12-29 NOTE — Discharge Instructions (Signed)
Follow up with your md in 2-3 days °

## 2013-12-29 NOTE — ED Notes (Signed)
Patient ambulated to restroom with steady gait. 2 staff assist. Returned to room without complication.

## 2013-12-29 NOTE — ED Notes (Signed)
Ambulated patient to restroom. On way back to room, patient became increasingly short of breath, states weakness. Wheelchair obtained and patient returned to room. Patient 70% on RA upon return to room. Wheezing noted. MD aware and examined patient.

## 2013-12-29 NOTE — ED Notes (Signed)
Patient arrived via EMS from [facility]. Respiratory distress. Expiratory wheezing noted. Lips cyanotic.

## 2013-12-29 NOTE — ED Notes (Signed)
Pt sent by assisted living for cough/fever/wheezing/weakness.

## 2013-12-29 NOTE — ED Notes (Signed)
Patient ambulated to restroom with steady gait. 2 staff assist. Returned to room without difficulty.

## 2013-12-29 NOTE — ED Provider Notes (Signed)
CSN: [id number]     Arrival date & time 12/29/13  [id number] History   This chart was scribed for Richarda Blade, MD by Era Bumpers, ED scribe. This patient was seen in room APA02/APA02 and the patient's care was started at 0942.  Chief Complaint  Patient presents with  . Cough   The history is provided by the patient and the EMS personnel. No language interpreter was used.   HPI Comments: SHAMIRAH VONASEK is a 73 y.o. female who presents to the Emergency Department complaining of constant, gradually worsened SOB, cough, and generalized weakness onset 2 days ago. She is on O2 all the time at home. She reports that her cough is productive of phlegm. She did not eat breakfast this AM. She reports that she feels generally weak all over and light headed. She denies any CP.  Past Medical History  Diagnosis Date  . COPD (chronic obstructive pulmonary disease)   . Osteoporosis   . Anxiety   . Weakness   . Tachycardia   . Cancer     bladder  . Depression   . Arthritis     KNEES AND HANDS  . Coarse tremors     TREMORS BOTH HANDS - PT STATES SIDE EFFECT OF HER MEDICATIONS FOR HER BREATHING  . Dizziness     sometimes  . Shortness of breath     USES OXYGEN 2 L / MIN NASAL CANNUA   24 HRS A DAY; LIVES IN [name]--CAN AMBULATE SHORT DISTANCE BUT ACTIVITIES VERY LIMITED BY SOB  . Anginal pain     "with fluttering, seen MD about it"  . History of shingles   . Emphysema   . GERD (gastroesophageal reflux disease)     sometimes  . Laryngitis     10/2013, hx of  . Pneumonia     hx of  . Bipolar 1 disorder    Past Surgical History  Procedure Laterality Date  . Bladder surgery    . Cystoscopy w/ retrogrades Bilateral 06/19/2013    Procedure: CYSTOSCOPY WITH BIOSPY AND FULGERATION, BILATERAL RETROGRADE PYELOGRAM;  Surgeon: Malka So, MD;  Location: [facility];  Service: Urology;  Laterality: Bilateral;  . Breast surgery Left     FOR TUMOR - BENIGN  . Cataract  extraction Bilateral 3 years ago  . Cystoscopy w/ retrogrades Bilateral 12/25/2013    Procedure: CYSTOSCOPY WITH BILATERAL RETROGRADE WITH BIOPSY WITH FULGERATION;  Surgeon: Irine Seal, MD;  Location: [facility];  Service: Urology;  Laterality: Bilateral;  . Transurethral resection of bladder tumor Bilateral 12/25/2013    Procedure: TRANSURETHRAL RESECTION OF BLADDER TUMOR (TURBT);  Surgeon: Irine Seal, MD;  Location: [facility];  Service: Urology;  Laterality: Bilateral;   No family history on file. History  Substance Use Topics  . Smoking status: Former Smoker -- 2.00 packs/day for 40 years    Types: Cigarettes    Quit date: 11/13/2000  . Smokeless tobacco: Never Used     Comment: QUIT SMOKING YRS AGO  . Alcohol Use: No   OB History   Grav Para Term Preterm Abortions TAB SAB Ect Mult Living                 Review of Systems  Constitutional: Negative for fever and chills.  HENT: Negative for congestion and rhinorrhea.   Respiratory: Positive for cough and shortness of breath.   Cardiovascular: Negative for chest pain.  Gastrointestinal: Negative for nausea, vomiting, abdominal pain and  diarrhea.  Musculoskeletal: Negative for back pain.  Skin: Negative for color change and rash.  Neurological: Positive for weakness (generalized). Negative for syncope.  All other systems reviewed and are negative.    Allergies  Review of patient's allergies indicates no known allergies.  Home Medications   Current Outpatient Rx  Name  Route  Sig  Dispense  Refill  . albuterol (PROVENTIL) (2.5 MG/3ML) 0.083% nebulizer solution   Nebulization   Take 2.5 mg by nebulization 3 (three) times daily.         Marland Kitchen ALPRAZolam (XANAX) 1 MG tablet   Oral   Take 1 mg by mouth at bedtime.         Marland Kitchen aspirin EC 81 MG tablet   Oral   Take 81 mg by mouth daily.         . calcium-vitamin D (OSCAL WITH D) 500-200 MG-UNIT per tablet   Oral   Take 1 tablet by mouth 2 (two) times daily.         .  cetirizine (ZYRTEC) 10 MG tablet   Oral   Take 10 mg by mouth at bedtime.         Marland Kitchen diltiazem (TIAZAC) 240 MG 24 hr capsule   Oral   Take 240 mg by mouth every morning.          . ferrous sulfate 325 (65 FE) MG tablet   Oral   Take 325 mg by mouth daily.         Marland Kitchen FLUoxetine (PROZAC) 20 MG tablet   Oral   Take 20 mg by mouth every morning.          . fluticasone (FLONASE) 50 MCG/ACT nasal spray   Nasal   Place 2 sprays into the nose daily.         . Fluticasone-Salmeterol (ADVAIR) 250-50 MCG/DOSE AEPB   Inhalation   Inhale 1 puff into the lungs every 12 (twelve) hours.         . furosemide (LASIX) 40 MG tablet   Oral   Take 40 mg by mouth every morning.          . Multiple Vitamins-Minerals (HCA SUPER THERAVITE-M PO)   Oral   Take 1 tablet by mouth daily.         . risperiDONE (RISPERDAL) 0.5 MG tablet   Oral   Take 0.5 mg by mouth at bedtime.         Marland Kitchen tiotropium (SPIRIVA) 18 MCG inhalation capsule   Inhalation   Place 18 mcg into inhaler and inhale every morning.         Marland Kitchen acetaminophen (TYLENOL) 325 MG tablet   Oral   Take 325-650 mg by mouth 4 (four) times daily as needed for mild pain or moderate pain.          Marland Kitchen alendronate (FOSAMAX) 70 MG tablet   Oral   Take 70 mg by mouth every Friday. Take with a full glass of water on an empty stomach.         . dextromethorphan-guaiFENesin (ROBITUSSIN-DM) 10-100 MG/5ML liquid   Oral   Take 10 mLs by mouth every 4 (four) hours as needed for cough.          Triage Vitals: BP 122/55  Pulse 108  Temp(Src) 98.8 F (37.1 C)  Resp 20  Wt 165 lb (74.844 kg)  SpO2 94%  Physical Exam  Nursing note and vitals reviewed. Constitutional: She is oriented to person, place, and  time. She appears well-developed and well-nourished. No distress.  HENT:  Head: Normocephalic and atraumatic.  Eyes: Conjunctivae are normal. Right eye exhibits no discharge. Left eye exhibits no discharge.  Neck: Normal  range of motion.  Cardiovascular: Normal rate, regular rhythm and normal heart sounds.   Pulmonary/Chest: Effort normal and breath sounds normal. No respiratory distress. She has no wheezes. She has no rales.  Musculoskeletal: Normal range of motion. She exhibits edema.  1+ Edema  Neurological: She is alert and oriented to person, place, and time.  Skin: Skin is warm and dry.  Psychiatric: She has a normal mood and affect. Thought content normal.   ED Course  Procedures (including critical care time) DIAGNOSTIC STUDIES: Oxygen Saturation is 94% on room air, adequate by my interpretation.    COORDINATION OF CARE: At 1000 AM Discussed treatment plan with patient which includes EKG, CXR, blood work, cardiac enzymes. Patient agrees.   EKG Interpretation    Date/Time:  Monday December 29 2013 09:46:23 EST Ventricular Rate:  109 PR Interval:  120 QRS Duration: 84 QT Interval:  306 QTC Calculation: 412 R Axis:   68 Text Interpretation:  Sinus tachycardia with Premature supraventricular complexes and with occasional Premature ventricular complexes Low voltage QRS Borderline ECG since last tracing no significant change Confirmed by Damarri Rampy  MD, Aleni Andrus ([id number]) on 12/29/2013 11:13:27 AM             Labs Review Labs Reviewed  CBC WITH DIFFERENTIAL - Abnormal; Notable for the following:    RBC 3.76 (*)    Hemoglobin 11.2 (*)    HCT 34.9 (*)    RDW 15.7 (*)    Neutrophils Relative % 83 (*)    Lymphocytes Relative 7 (*)    Lymphs Abs 0.6 (*)    All other components within normal limits  BASIC METABOLIC PANEL - Abnormal; Notable for the following:    Chloride 92 (*)    CO2 33 (*)    Glucose, Bld 115 (*)    GFR calc non Af Amer 67 (*)    GFR calc Af Amer 77 (*)    All other components within normal limits  D-DIMER, QUANTITATIVE - Abnormal; Notable for the following:    D-Dimer, Quant 0.95 (*)    All other components within normal limits  TROPONIN I   Imaging Review Dg Chest  Portable 1 View  12/29/2013   CLINICAL DATA:  Cough  EXAM: PORTABLE CHEST - 1 VIEW  COMPARISON:  December 09, 2013  FINDINGS: There is underlying emphysematous change. There is no edema or consolidation. Heart is upper normal in size. The pulmonary vascularity reflects underlying emphysematous change. No bone lesions. No adenopathy.  IMPRESSION: Underlying emphysematous change.  No edema or consolidation.   Electronically Signed   By: Lowella Grip M.D.   On: 12/29/2013 10:20    EKG Interpretation    Date/Time:  Monday December 29 2013 09:46:23 EST Ventricular Rate:  109 PR Interval:  120 QRS Duration: 84 QT Interval:  306 QTC Calculation: 412 R Axis:   68 Text Interpretation:  Sinus tachycardia with Premature supraventricular complexes and with occasional Premature ventricular complexes Low voltage QRS Borderline ECG since last tracing no significant change Confirmed by Savvy Peeters  MD, Jaiana Sheffer ([id number]) on 12/29/2013 11:13:27 AM           MDM   Final diagnoses:  Dyspnea     Nonspecific shortness of breath with no clear etiology on initial evaluation. She required a d-dimer testing for  rule out PE, the d-dimer is mildly elevated, and she is sent for VQ scan to evaluate for the presence of pulmonary embolus.  Nursing Notes Reviewed/ Care Coordinated, and agree without changes. Applicable Imaging Reviewed.  Interpretation of Laboratory Data incorporated into ED treatment   Care to Dr. Roderic Palau- to evaluate after VQ, then arrange disposition.- 1600  I personally performed the services described in this documentation, which was scribed in my presence. The recorded information has been reviewed and is accurate.       Richarda Blade, MD 12/29/13 [phone]

## 2013-12-29 NOTE — ED Notes (Signed)
Per CT staff, IV infiltrated. MD aware VQ scan will be a 2000 hours.

## 2014-01-02 ENCOUNTER — Ambulatory Visit: Payer: PRIVATE HEALTH INSURANCE | Admitting: Urology

## 2014-01-09 ENCOUNTER — Ambulatory Visit: Payer: PRIVATE HEALTH INSURANCE | Admitting: Urology

## 2014-01-16 ENCOUNTER — Ambulatory Visit (INDEPENDENT_AMBULATORY_CARE_PROVIDER_SITE_OTHER): Payer: PRIVATE HEALTH INSURANCE | Admitting: Urology

## 2014-01-16 DIAGNOSIS — C675 Malignant neoplasm of bladder neck: Secondary | ICD-10-CM

## 2014-01-16 DIAGNOSIS — D090 Carcinoma in situ of bladder: Secondary | ICD-10-CM

## 2014-01-20 ENCOUNTER — Ambulatory Visit (INDEPENDENT_AMBULATORY_CARE_PROVIDER_SITE_OTHER): Payer: PRIVATE HEALTH INSURANCE | Admitting: Urology

## 2014-01-20 DIAGNOSIS — D090 Carcinoma in situ of bladder: Secondary | ICD-10-CM

## 2014-01-20 DIAGNOSIS — C675 Malignant neoplasm of bladder neck: Secondary | ICD-10-CM

## 2014-01-27 ENCOUNTER — Ambulatory Visit (INDEPENDENT_AMBULATORY_CARE_PROVIDER_SITE_OTHER): Payer: PRIVATE HEALTH INSURANCE | Admitting: Urology

## 2014-01-27 DIAGNOSIS — C675 Malignant neoplasm of bladder neck: Secondary | ICD-10-CM

## 2014-01-27 DIAGNOSIS — D090 Carcinoma in situ of bladder: Secondary | ICD-10-CM

## 2014-02-03 ENCOUNTER — Ambulatory Visit (INDEPENDENT_AMBULATORY_CARE_PROVIDER_SITE_OTHER): Payer: PRIVATE HEALTH INSURANCE | Admitting: Urology

## 2014-02-03 DIAGNOSIS — C679 Malignant neoplasm of bladder, unspecified: Secondary | ICD-10-CM

## 2014-02-10 ENCOUNTER — Ambulatory Visit (INDEPENDENT_AMBULATORY_CARE_PROVIDER_SITE_OTHER): Payer: PRIVATE HEALTH INSURANCE | Admitting: Urology

## 2014-02-10 DIAGNOSIS — D090 Carcinoma in situ of bladder: Secondary | ICD-10-CM

## 2014-02-10 DIAGNOSIS — C675 Malignant neoplasm of bladder neck: Secondary | ICD-10-CM

## 2014-02-20 ENCOUNTER — Ambulatory Visit (INDEPENDENT_AMBULATORY_CARE_PROVIDER_SITE_OTHER): Payer: PRIVATE HEALTH INSURANCE | Admitting: Urology

## 2014-02-20 DIAGNOSIS — C673 Malignant neoplasm of anterior wall of bladder: Secondary | ICD-10-CM

## 2014-03-03 ENCOUNTER — Ambulatory Visit (INDEPENDENT_AMBULATORY_CARE_PROVIDER_SITE_OTHER): Payer: PRIVATE HEALTH INSURANCE | Admitting: Urology

## 2014-03-03 DIAGNOSIS — C675 Malignant neoplasm of bladder neck: Secondary | ICD-10-CM

## 2014-03-03 DIAGNOSIS — D090 Carcinoma in situ of bladder: Secondary | ICD-10-CM

## 2014-05-08 ENCOUNTER — Ambulatory Visit (INDEPENDENT_AMBULATORY_CARE_PROVIDER_SITE_OTHER): Payer: PRIVATE HEALTH INSURANCE | Admitting: Urology

## 2014-05-08 DIAGNOSIS — D090 Carcinoma in situ of bladder: Secondary | ICD-10-CM

## 2014-06-12 ENCOUNTER — Ambulatory Visit (INDEPENDENT_AMBULATORY_CARE_PROVIDER_SITE_OTHER): Payer: PRIVATE HEALTH INSURANCE | Admitting: Urology

## 2014-06-12 DIAGNOSIS — Z85528 Personal history of other malignant neoplasm of kidney: Secondary | ICD-10-CM

## 2014-06-12 DIAGNOSIS — D090 Carcinoma in situ of bladder: Secondary | ICD-10-CM

## 2014-06-19 ENCOUNTER — Other Ambulatory Visit: Payer: Self-pay | Admitting: Urology

## 2014-06-24 ENCOUNTER — Encounter (HOSPITAL_COMMUNITY): Payer: Self-pay | Admitting: Pharmacy Technician

## 2014-06-29 ENCOUNTER — Encounter (HOSPITAL_COMMUNITY)
Admission: RE | Admit: 2014-06-29 | Discharge: 2014-06-29 | Disposition: A | Discharge status: Home / Self Care | Payer: PRIVATE HEALTH INSURANCE | Source: Ambulatory Visit | Attending: Urology | Admitting: Urology

## 2014-06-29 ENCOUNTER — Encounter (INDEPENDENT_AMBULATORY_CARE_PROVIDER_SITE_OTHER): Payer: Self-pay

## 2014-06-29 ENCOUNTER — Encounter (HOSPITAL_COMMUNITY): Payer: Self-pay

## 2014-06-29 DIAGNOSIS — D494 Neoplasm of unspecified behavior of bladder: Secondary | ICD-10-CM | POA: Diagnosis present

## 2014-06-29 DIAGNOSIS — Z9981 Dependence on supplemental oxygen: Secondary | ICD-10-CM | POA: Diagnosis not present

## 2014-06-29 DIAGNOSIS — F411 Generalized anxiety disorder: Secondary | ICD-10-CM | POA: Diagnosis not present

## 2014-06-29 DIAGNOSIS — J449 Chronic obstructive pulmonary disease, unspecified: Secondary | ICD-10-CM | POA: Diagnosis not present

## 2014-06-29 DIAGNOSIS — C679 Malignant neoplasm of bladder, unspecified: Secondary | ICD-10-CM | POA: Diagnosis not present

## 2014-06-29 DIAGNOSIS — I10 Essential (primary) hypertension: Secondary | ICD-10-CM | POA: Diagnosis not present

## 2014-06-29 DIAGNOSIS — R059 Cough, unspecified: Secondary | ICD-10-CM

## 2014-06-29 DIAGNOSIS — Z87891 Personal history of nicotine dependence: Secondary | ICD-10-CM | POA: Diagnosis not present

## 2014-06-29 DIAGNOSIS — Z7982 Long term (current) use of aspirin: Secondary | ICD-10-CM | POA: Diagnosis not present

## 2014-06-29 DIAGNOSIS — Z8701 Personal history of pneumonia (recurrent): Secondary | ICD-10-CM | POA: Diagnosis not present

## 2014-06-29 DIAGNOSIS — IMO0002 Reserved for concepts with insufficient information to code with codable children: Secondary | ICD-10-CM | POA: Diagnosis not present

## 2014-06-29 HISTORY — DX: Cough: R05

## 2014-06-29 HISTORY — DX: Cough, unspecified: R05.9

## 2014-06-29 HISTORY — DX: Frequency of micturition: R35.0

## 2014-06-29 LAB — BASIC METABOLIC PANEL
Anion gap: 11 (ref 5–15)
BUN: 15 mg/dL (ref 6–23)
CO2: 36 meq/L — AB (ref 19–32)
CREATININE: 0.93 mg/dL (ref 0.50–1.10)
Calcium: 9.7 mg/dL (ref 8.4–10.5)
Chloride: 96 mEq/L (ref 96–112)
GFR calc Af Amer: 69 mL/min — ABNORMAL LOW (ref >90)
GFR, EST NON AFRICAN AMERICAN: 60 mL/min — AB (ref >90)
GLUCOSE: 119 mg/dL — AB (ref 70–99)
Potassium: 3.8 mEq/L (ref 3.7–5.3)
Sodium: 143 mEq/L (ref 137–147)

## 2014-06-29 LAB — CBC
HCT: 34.8 % — ABNORMAL LOW (ref 36.0–46.0)
HEMOGLOBIN: 11 g/dL — AB (ref 12.0–15.0)
MCH: 29.8 pg (ref 26.0–34.0)
MCHC: 31.6 g/dL (ref 30.0–36.0)
MCV: 94.3 fL (ref 78.0–100.0)
Platelets: 318 10*3/uL (ref 150–400)
RBC: 3.69 MIL/uL — AB (ref 3.87–5.11)
RDW: 14.6 % (ref 11.5–15.5)
WBC: 6.9 10*3/uL (ref 4.0–10.5)

## 2014-06-29 NOTE — Patient Instructions (Signed)
YOUR SURGERY IS SCHEDULED AT [facility]  ON:  Thursday  8/20  REPORT TO  SHORT STAY CENTER AT:  8:30 AM   PLEASE COME IN THE [facility] MAIN HOSPITAL ENTRANCE AND FOLLOW SIGNS TO SHORT STAY CENTER.  DO NOT EAT OR DRINK ANYTHING AFTER MIDNIGHT THE NIGHT BEFORE YOUR SURGERY.  YOU MAY BRUSH YOUR TEETH, RINSE OUT YOUR MOUTH--BUT NO WATER, NO FOOD, NO CHEWING GUM, NO MINTS, NO CANDIES, NO CHEWING TOBACCO.  PLEASE TAKE THE FOLLOWING MEDICATIONS THE AM OF YOUR SURGERY WITH A FEW SIPS OF WATER:  DILTIAZEM, FLUOXETINE.   USE YOUR ALBUTEROL NEBULIZER, YOUR SYMBICORT AND SPIRIVA INHALERS AND FLUTICASONE NASAL SPRAY.   DO NOT BRING VALUABLES, MONEY, CREDIT CARDS.  DO NOT WEAR JEWELRY, MAKE-UP, NAIL POLISH AND NO METAL PINS OR CLIPS IN YOUR HAIR. CONTACT LENS, DENTURES / PARTIALS, GLASSES SHOULD NOT BE WORN TO SURGERY AND IN MOST CASES-HEARING AIDS WILL NEED TO BE REMOVED.  BRING YOUR GLASSES CASE, ANY EQUIPMENT NEEDED FOR YOUR CONTACT LENS. FOR PATIENTS ADMITTED TO THE HOSPITAL--CHECK OUT TIME THE DAY OF DISCHARGE IS 11:00 AM.  ALL INPATIENT ROOMS ARE PRIVATE - WITH BATHROOM, TELEPHONE, TELEVISION AND WIFI INTERNET.  IF YOU ARE BEING DISCHARGED THE SAME DAY OF YOUR SURGERY--YOU CAN NOT DRIVE YOURSELF HOME--AND SHOULD NOT GO HOME ALONE BY TAXI OR BUS.  NO DRIVING OR OPERATING MACHINERY, OR MAKING LEGAL DECISIONS FOR 24 HOURS FOLLOWING ANESTHESIA / PAIN MEDICATIONS.  PLEASE MAKE ARRANGEMENTS FOR SOMEONE TO BE WITH YOU AT HOME THE FIRST 24 HOURS AFTER SURGERY. RESPONSIBLE DRIVER'S NAME / PHONE                                                   PLEASE READ OVER ANY  FACT SHEETS THAT YOU WERE GIVEN: MRSA INFORMATION, BLOOD TRANSFUSION INFORMATION, INCENTIVE SPIROMETER INFORMATION.  PLEASE BE AWARE THAT YOU MAY NEED ADDITIONAL BLOOD DRAWN DAY OF YOUR SURGERY  _______________________________________________________________________   [facility] - Preparing for Surgery Before surgery, you can  play an important role.  Because skin is not sterile, your skin needs to be as free of germs as possible.  You can reduce the number of germs on your skin by washing with CHG (chlorahexidine gluconate) soap before surgery.  CHG is an antiseptic cleaner which kills germs and bonds with the skin to continue killing germs even after washing. Please DO NOT use if you have an allergy to CHG or antibacterial soaps.  If your skin becomes reddened/irritated stop using the CHG and inform your nurse when you arrive at Short Stay. Do not shave (including legs and underarms) for at least 48 hours prior to the first CHG shower.  You may shave your face/neck. Please follow these instructions carefully:  1.  Shower with CHG Soap the night before surgery and the  morning of Surgery.  2.  If you choose to wash your hair, wash your hair first as usual with your  normal  shampoo.  3.  After you shampoo, rinse your hair and body thoroughly to remove the  shampoo.                           4.  Use CHG as you would any other liquid soap.  You can apply chg directly  to the skin and  wash                       Gently with a scrungie or clean washcloth.  5.  Apply the CHG Soap to your body ONLY FROM THE NECK DOWN.   Do not use on face/ open                           Wound or open sores. Avoid contact with eyes, ears mouth and genitals (private parts).                       Wash face,  Genitals (private parts) with your normal soap.             6.  Wash thoroughly, paying special attention to the area where your surgery  will be performed.  7.  Thoroughly rinse your body with warm water from the neck down.  8.  DO NOT shower/wash with your normal soap after using and rinsing off  the CHG Soap.                9.  Pat yourself dry with a clean towel.            10.  Wear clean pajamas.            11.  Place clean sheets on your bed the night of your first shower and do not  sleep with pets. Day of Surgery : Do not apply any  lotions/deodorants the morning of surgery.  Please wear clean clothes to the hospital/surgery center.  FAILURE TO FOLLOW THESE INSTRUCTIONS MAY RESULT IN THE CANCELLATION OF YOUR SURGERY PATIENT SIGNATURE_________________________________  NURSE SIGNATURE__________________________________  ________________________________________________________________________

## 2014-06-29 NOTE — Pre-Procedure Instructions (Addendum)
CT ANGIO OF CHEST AND CXR REPORTS ARE IN EPIC FROM 12-29-13. EKG REPORT IN EPIC FROM 12-30-13 - ABNORMAL RATE 109.  EKG REPEATED TODAY PREOP. PT GIVEN COPY OF HER PREOP INSTRUCTIONS TO GIVE TO CARET[facility]ER AT [facility] WHERE SHE RESIDES.  HER SISTER WAS WITH HER - ALSO HAS COPY OF THE INSTRUCTIONS - THE SISTER WILL BRING HER DAY OF SURGERY - April Pearson. MEDICAL CLEARANCE ON CHART FROM DR. HAWKINS.

## 2014-07-01 NOTE — H&P (Signed)
Active Problems  1. Abnormal urine findings (791.9)  2. 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 History of bladder cancer (V10.51)  4. 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   She has had no hematuria.  She has had no dysuria.   She has had no pain.  She is due for cystoscopy and cytology today.   Past Medical History  1. History of Anxiety (300.00)  2. History of Chronic obstructive pulmonary disease (496)  3. History of bladder cancer (V10.51)  4. History of hypertension (V12.59)  5. History of pneumonia (V12.61)  6. History of vertigo (V12.49)  7. History of Osteoporosis (733.00)  8. Personal history of bladder cancer (V10.51)  Surgical History  1. History of Bladder Surgery  2. History of Bladder Surgery  3. History of Breast Surgery Lumpectomy  4. History of Cystoscopy With Fulguration Medium Lesion (2-5cm)  5. History of Cystoscopy With Fulguration Medium Lesion (2-5cm)  Current Meds  1. Acetaminophen 325 MG Oral Tablet;  Therapy: (Recorded:21Jun2013) to Recorded  2. Advair Diskus 250-50 MCG/DOSE Inhalation Aerosol Powder Breath Activated;  Therapy: (Recorded:21Jun2013) to Recorded  3. Albuterol Sulfate NEBU;  Therapy: (Recorded:21Jun2013) to Recorded  4. Alendronate Sodium 70 MG Oral Tablet;  Therapy: (Recorded:21Jun2013) to Recorded  5. ALPRAZolam 1 MG Oral Tablet;  Therapy: (Recorded:21Jun2013) to Recorded  6. Aspirin 81 MG Oral Tablet;  Therapy: (Recorded:21Jun2013) to Recorded  7. Calcium 500 + D TABS;  Therapy: (Recorded:21Jun2013) to Recorded  8. Diltiazem HCl ER 240 MG Oral Capsule Extended Release 24 Hour;  Therapy: (Recorded:21Jun2013) to Recorded  9. Ferrous Sulfate TABS;  Therapy: (Recorded:21Jun2013) to Recorded  10. FLUoxetine HCl - 20 MG Oral Capsule;   Therapy:  (Recorded:21Jun2013) to Recorded  11. Fluticasone Propionate 50 MCG/ACT Nasal Suspension;   Therapy: (Recorded:21Jun2013) to Recorded  12. Furosemide 40 MG Oral Tablet;   Therapy: (Recorded:21Jun2013) to Recorded  13. Meclizine HCl - 25 MG Oral Tablet;   Therapy: (Recorded:21Jun2013) to Recorded  14. Multi-Vitamin TABS;   Therapy: (Recorded:21Jun2013) to Recorded  15. Oxygen Use;   Therapy: (Recorded:27Sep2013) to Recorded  16. RisperiDONE TABS;   Therapy: (Recorded:21Jun2013) to Recorded  17. Spiriva HandiHaler 18 MCG Inhalation Capsule;   Therapy: (Recorded:15Aug2014) to Recorded  18. Tussionex Pennkinetic ER 10-8 MG/5ML Oral Liquid Extended Release;   Therapy: (Recorded:21Jun2013) to Recorded  Allergies  1. No Known Drug Allergies  Family History  1. Family history of Carcinoma Of The Stomach (V16.0)  2. Family history of Death In The Family Father  3. Family history of Death In The Family Mother  4. Family history of Hypertension (V17.49)  5. Family history of Interstitial Emphysema  6. Family history of Renal Disease  Social History  1. Former smoker (V15.82)   2 pks p/d for 35 yrs quit 7 yrs  2. Marital History - Divorced (V61.03)  3. Never Drank Alcohol  4. Occupation: Retired  Review of Systems  Gastrointestinal: no diarrhea and no constipation.  Cardiovascular: no chest pain and no leg swelling.  Respiratory: shortness of breath and cough (she was recently on antibiotics for a URI. ).    Vitals Vital Signs [Data Includes: Last 1 Day]  Recorded: [id number] 02:05PM  Height: 5 ft 6.5 in Weight: 167 lb  BMI Calculated: 26.55 BSA Calculated: 1.86 Blood Pressure: 146 /  84 Temperature: 98.8 F Heart Rate: 99  Physical Exam Constitutional: Well nourished and well developed . No acute distress.  Pulmonary: No respiratory distress and normal respiratory rhythm and effort.  Cardiovascular: Heart rate and rhythm are normal . No peripheral edema.     Results/Data Urine [Data Includes: Last 1 Day]   [id number]  COLOR YELLOW   APPEARANCE CLEAR   SPECIFIC GRAVITY 1.010   pH 7.0   GLUCOSE NEG mg/dL  BILIRUBIN NEG   KETONE NEG mg/dL  BLOOD NEG   PROTEIN NEG mg/dL  UROBILINOGEN 0.2 mg/dL  NITRITE NEG   LEUKOCYTE ESTERASE TRACE   SQUAMOUS EPITHELIAL/HPF RARE   WBC 0-2 WBC/hpf  RBC NONE SEEN RBC/hpf  BACTERIA NONE SEEN   CRYSTALS NONE SEEN   CASTS NONE SEEN   Other     Procedure  Procedure: Cystoscopy   Indication: History of Urothelial Carcinoma.  Prep: The patient was prepped with betadine.  Antibiotic prophylaxis: Ciprofloxacin.  Procedure Note:  Urethral meatus:. No abnormalities.  Anterior urethra: No abnormalities.  Bladder: Visulization was clear. The ureteral orifices were in the normal anatomic position bilaterally. Examination of the bladder demonstrated erythematous mucosa (Patchy and diffuse but greatest on the right and bladder neck. This is more consistent with BCG treatment effect. ). A solitary tumor was visualized in the bladder (that looks most consistent with CIS. ). A tumor was seen in the bladder measuring approximately 2 cm in size. This tumor was located on the right side, on the anterior aspect of the bladder. The patient tolerated the procedure well.  Complications: None.    Assessment  1. 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 History of bladder cancer (V10.51)   She has a persistent/recurrent tumor on the right anterior wall that appears most consistent with CIS.   Plan Carcinoma in situ of bladder   1. Follow-up Schedule Surgery Office  Follow-up  Status: Hold For - Appointment   Requested for: 31Jul2015  2. UA With REFLEX; [Do Not Release]; Status:Resulted - Requires Verification;   Done:  [id number] 02:12PM PMH: History of bladder cancer   3. URINE CYTOLOGY; Status:Hold For - Specimen/Data Collection,Appointment;  Requested for:31Jul2015;    Urine cytology today. I will get her set up  to go to the OR for cystoscopy with bil RTG's and  TURBT with additional biopsies and fulguration.   I reviewed the risks of bleeding, infection and bladder injury as well as thrombotic events and anesthetic complications.   She is high risk because of her pulmonary issues and will need to be kept overnight.   She will need a second course of BCG most likely. .   Discussion/Summary   CC: Dr. Velvet Bathe.

## 2014-07-02 ENCOUNTER — Encounter (HOSPITAL_COMMUNITY)
Admission: RE | Disposition: A | Discharge status: Home / Self Care | Payer: Self-pay | Source: Ambulatory Visit | Attending: Urology

## 2014-07-02 ENCOUNTER — Encounter (HOSPITAL_COMMUNITY): Payer: Self-pay

## 2014-07-02 ENCOUNTER — Encounter (INDEPENDENT_AMBULATORY_CARE_PROVIDER_SITE_OTHER): Payer: Self-pay | Admitting: *Deleted

## 2014-07-02 ENCOUNTER — Ambulatory Visit (HOSPITAL_COMMUNITY): Payer: PRIVATE HEALTH INSURANCE | Admitting: Anesthesiology

## 2014-07-02 ENCOUNTER — Observation Stay (HOSPITAL_COMMUNITY)
Admission: RE | Admit: 2014-07-02 | Discharge: 2014-07-03 | Disposition: A | Discharge status: Home / Self Care | Payer: PRIVATE HEALTH INSURANCE | Source: Ambulatory Visit | Attending: Urology | Admitting: Urology

## 2014-07-02 ENCOUNTER — Encounter (HOSPITAL_COMMUNITY): Payer: PRIVATE HEALTH INSURANCE | Admitting: Anesthesiology

## 2014-07-02 DIAGNOSIS — J449 Chronic obstructive pulmonary disease, unspecified: Secondary | ICD-10-CM | POA: Diagnosis not present

## 2014-07-02 DIAGNOSIS — F411 Generalized anxiety disorder: Secondary | ICD-10-CM | POA: Insufficient documentation

## 2014-07-02 DIAGNOSIS — C679 Malignant neoplasm of bladder, unspecified: Secondary | ICD-10-CM | POA: Diagnosis not present

## 2014-07-02 DIAGNOSIS — J4489 Other specified chronic obstructive pulmonary disease: Secondary | ICD-10-CM | POA: Insufficient documentation

## 2014-07-02 DIAGNOSIS — Z8701 Personal history of pneumonia (recurrent): Secondary | ICD-10-CM | POA: Insufficient documentation

## 2014-07-02 DIAGNOSIS — I10 Essential (primary) hypertension: Secondary | ICD-10-CM | POA: Diagnosis not present

## 2014-07-02 DIAGNOSIS — Z9981 Dependence on supplemental oxygen: Secondary | ICD-10-CM | POA: Insufficient documentation

## 2014-07-02 DIAGNOSIS — J441 Chronic obstructive pulmonary disease with (acute) exacerbation: Secondary | ICD-10-CM | POA: Diagnosis present

## 2014-07-02 DIAGNOSIS — IMO0002 Reserved for concepts with insufficient information to code with codable children: Secondary | ICD-10-CM | POA: Insufficient documentation

## 2014-07-02 DIAGNOSIS — Z7982 Long term (current) use of aspirin: Secondary | ICD-10-CM | POA: Insufficient documentation

## 2014-07-02 DIAGNOSIS — Z87891 Personal history of nicotine dependence: Secondary | ICD-10-CM | POA: Insufficient documentation

## 2014-07-02 HISTORY — PX: TRANSURETHRAL RESECTION OF BLADDER TUMOR: SHX2575

## 2014-07-02 HISTORY — PX: CYSTOSCOPY WITH RETROGRADE PYELOGRAM, URETEROSCOPY AND STENT PLACEMENT: SHX5789

## 2014-07-02 SURGERY — CYSTOURETEROSCOPY, WITH RETROGRADE PYELOGRAM AND STENT INSERTION
Anesthesia: General

## 2014-07-02 MED ORDER — ZOLPIDEM TARTRATE 5 MG PO TABS: 5.0000 mg | ORAL_TABLET | Freq: Every evening | ORAL | Status: DC | PRN

## 2014-07-02 MED ORDER — FENTANYL CITRATE 0.05 MG/ML IJ SOLN
INTRAMUSCULAR | Status: DC | PRN
Start: 2014-07-02 — End: 2014-07-02
  Administered 2014-07-02: 25 ug via INTRAVENOUS

## 2014-07-02 MED ORDER — ALPRAZOLAM 1 MG PO TABS
1.0000 mg | ORAL_TABLET | Freq: Every day | ORAL | Status: DC
  Administered 2014-07-02: 1 mg via ORAL
  Filled 2014-07-02: qty 1

## 2014-07-02 MED ORDER — ALBUTEROL SULFATE (2.5 MG/3ML) 0.083% IN NEBU
2.5000 mg | INHALATION_SOLUTION | Freq: Three times a day (TID) | RESPIRATORY_TRACT | Status: DC
  Administered 2014-07-02 – 2014-07-03 (×3): 2.5 mg via RESPIRATORY_TRACT
  Filled 2014-07-02 (×3): qty 3

## 2014-07-02 MED ORDER — ONDANSETRON HCL 4 MG/2ML IJ SOLN: 4.0000 mg | INTRAMUSCULAR | Status: DC | PRN

## 2014-07-02 MED ORDER — FLUTICASONE PROPIONATE 50 MCG/ACT NA SUSP
2.0000 | Freq: Every day | NASAL | Status: DC
  Administered 2014-07-02 – 2014-07-03 (×2): 2 via NASAL
  Filled 2014-07-02: qty 16

## 2014-07-02 MED ORDER — PROMETHAZINE HCL 25 MG/ML IJ SOLN: 6.2500 mg | INTRAMUSCULAR | Status: DC | PRN

## 2014-07-02 MED ORDER — ALENDRONATE SODIUM 70 MG PO TABS: 70.0000 mg | ORAL_TABLET | ORAL | Status: DC

## 2014-07-02 MED ORDER — BUDESONIDE-FORMOTEROL FUMARATE 160-4.5 MCG/ACT IN AERO
2.0000 | INHALATION_SPRAY | Freq: Two times a day (BID) | RESPIRATORY_TRACT | Status: DC
  Administered 2014-07-03: 2 via RESPIRATORY_TRACT
  Filled 2014-07-02: qty 6

## 2014-07-02 MED ORDER — KETOROLAC TROMETHAMINE 30 MG/ML IJ SOLN: 15.0000 mg | Freq: Once | INTRAMUSCULAR | Status: DC | PRN

## 2014-07-02 MED ORDER — DILTIAZEM HCL ER BEADS 240 MG PO CP24
240.0000 mg | ORAL_CAPSULE | Freq: Every morning | ORAL | Status: DC
  Administered 2014-07-03: 240 mg via ORAL
  Filled 2014-07-02: qty 1

## 2014-07-02 MED ORDER — KCL IN DEXTROSE-NACL 20-5-0.45 MEQ/L-%-% IV SOLN
INTRAVENOUS | Status: DC
  Administered 2014-07-02: 75 mL/h via INTRAVENOUS
  Administered 2014-07-03: 06:00:00 via INTRAVENOUS
  Filled 2014-07-02 (×4): qty 1000

## 2014-07-02 MED ORDER — RISPERIDONE 0.5 MG PO TABS
0.5000 mg | ORAL_TABLET | Freq: Every day | ORAL | Status: DC
  Administered 2014-07-02: 0.5 mg via ORAL
  Filled 2014-07-02 (×2): qty 1

## 2014-07-02 MED ORDER — ACETAMINOPHEN 325 MG PO TABS
650.0000 mg | ORAL_TABLET | ORAL | Status: DC | PRN
Start: 2014-07-02 — End: 2014-07-03

## 2014-07-02 MED ORDER — BISACODYL 10 MG RE SUPP: 10.0000 mg | Freq: Every day | RECTAL | Status: DC | PRN

## 2014-07-02 MED ORDER — PROPOFOL 10 MG/ML IV BOLUS
INTRAVENOUS | Status: AC
  Filled 2014-07-02: qty 20

## 2014-07-02 MED ORDER — ACETAMINOPHEN 325 MG PO TABS: 325.0000 mg | ORAL_TABLET | Freq: Four times a day (QID) | ORAL | Status: DC | PRN

## 2014-07-02 MED ORDER — FERROUS SULFATE 325 (65 FE) MG PO TABS
325.0000 mg | ORAL_TABLET | Freq: Every day | ORAL | Status: DC
  Administered 2014-07-02 – 2014-07-03 (×2): 325 mg via ORAL
  Filled 2014-07-02 (×2): qty 1

## 2014-07-02 MED ORDER — PROPOFOL 10 MG/ML IV BOLUS
INTRAVENOUS | Status: DC | PRN
  Administered 2014-07-02: 120 mg via INTRAVENOUS

## 2014-07-02 MED ORDER — FUROSEMIDE 40 MG PO TABS
40.0000 mg | ORAL_TABLET | Freq: Every morning | ORAL | Status: DC
  Administered 2014-07-02 – 2014-07-03 (×2): 40 mg via ORAL
  Filled 2014-07-02 (×2): qty 1

## 2014-07-02 MED ORDER — LORATADINE 10 MG PO TABS
10.0000 mg | ORAL_TABLET | Freq: Every day | ORAL | Status: DC
  Administered 2014-07-02: 10 mg via ORAL
  Filled 2014-07-02 (×3): qty 1

## 2014-07-02 MED ORDER — HYOSCYAMINE SULFATE 0.125 MG SL SUBL
0.1250 mg | SUBLINGUAL_TABLET | SUBLINGUAL | Status: DC | PRN
  Filled 2014-07-02: qty 1

## 2014-07-02 MED ORDER — LACTATED RINGERS IV SOLN
INTRAVENOUS | Status: DC | PRN
  Administered 2014-07-02: 10:00:00 via INTRAVENOUS

## 2014-07-02 MED ORDER — DEXAMETHASONE SODIUM PHOSPHATE 10 MG/ML IJ SOLN
INTRAMUSCULAR | Status: DC | PRN
  Administered 2014-07-02: 10 mg via INTRAVENOUS

## 2014-07-02 MED ORDER — CIPROFLOXACIN IN D5W 400 MG/200ML IV SOLN
400.0000 mg | INTRAVENOUS | Status: AC
  Administered 2014-07-02: 400 mg via INTRAVENOUS

## 2014-07-02 MED ORDER — KETOROLAC TROMETHAMINE 15 MG/ML IJ SOLN
INTRAMUSCULAR | Status: AC
  Filled 2014-07-02: qty 1

## 2014-07-02 MED ORDER — CIPROFLOXACIN IN D5W 400 MG/200ML IV SOLN
INTRAVENOUS | Status: AC
  Filled 2014-07-02: qty 200

## 2014-07-02 MED ORDER — 0.9 % SODIUM CHLORIDE (POUR BTL) OPTIME
TOPICAL | Status: DC | PRN
  Administered 2014-07-02: 1000 mL

## 2014-07-02 MED ORDER — ONDANSETRON HCL 4 MG/2ML IJ SOLN
INTRAMUSCULAR | Status: DC | PRN
  Administered 2014-07-02: 4 mg via INTRAVENOUS

## 2014-07-02 MED ORDER — FENTANYL CITRATE 0.05 MG/ML IJ SOLN
25.0000 ug | INTRAMUSCULAR | Status: DC | PRN
  Administered 2014-07-02 (×2): 25 ug via INTRAVENOUS

## 2014-07-02 MED ORDER — HYDROCODONE-ACETAMINOPHEN 5-325 MG PO TABS: 1.0000 | ORAL_TABLET | ORAL | Status: DC | PRN

## 2014-07-02 MED ORDER — GUAIFENESIN-DM 100-10 MG/5ML PO SYRP: 10.0000 mL | ORAL_SOLUTION | ORAL | Status: DC | PRN

## 2014-07-02 MED ORDER — KETOROLAC TROMETHAMINE 15 MG/ML IJ SOLN
15.0000 mg | Freq: Once | INTRAMUSCULAR | Status: AC
  Administered 2014-07-02: 15 mg via INTRAVENOUS

## 2014-07-02 MED ORDER — CALCIUM CARBONATE-VITAMIN D 500-200 MG-UNIT PO TABS
1.0000 | ORAL_TABLET | Freq: Two times a day (BID) | ORAL | Status: DC
  Administered 2014-07-02 – 2014-07-03 (×3): 1 via ORAL
  Filled 2014-07-02 (×4): qty 1

## 2014-07-02 MED ORDER — CIPROFLOXACIN HCL 250 MG PO TABS
250.0000 mg | ORAL_TABLET | Freq: Two times a day (BID) | ORAL | Status: DC
  Administered 2014-07-02 – 2014-07-03 (×2): 250 mg via ORAL
  Filled 2014-07-02 (×4): qty 1

## 2014-07-02 MED ORDER — LIDOCAINE HCL (CARDIAC) 20 MG/ML IV SOLN
INTRAVENOUS | Status: DC | PRN
  Administered 2014-07-02: 100 mg via INTRAVENOUS

## 2014-07-02 MED ORDER — ADULT MULTIVITAMIN W/MINERALS CH
1.0000 | ORAL_TABLET | Freq: Every day | ORAL | Status: DC
  Administered 2014-07-02 – 2014-07-03 (×2): 1 via ORAL
  Filled 2014-07-02 (×2): qty 1

## 2014-07-02 MED ORDER — HYDROMORPHONE HCL PF 1 MG/ML IJ SOLN: 0.5000 mg | INTRAMUSCULAR | Status: DC | PRN

## 2014-07-02 MED ORDER — TIOTROPIUM BROMIDE MONOHYDRATE 18 MCG IN CAPS
18.0000 ug | ORAL_CAPSULE | Freq: Every morning | RESPIRATORY_TRACT | Status: DC
  Administered 2014-07-03: 18 ug via RESPIRATORY_TRACT
  Filled 2014-07-02: qty 5

## 2014-07-02 MED ORDER — SODIUM CHLORIDE 0.9 % IR SOLN
Status: DC | PRN
  Administered 2014-07-02: 6000 mL

## 2014-07-02 MED ORDER — FENTANYL CITRATE 0.05 MG/ML IJ SOLN
INTRAMUSCULAR | Status: AC
  Filled 2014-07-02: qty 2

## 2014-07-02 MED ORDER — IOHEXOL 300 MG/ML  SOLN
INTRAMUSCULAR | Status: DC | PRN
  Administered 2014-07-02: 14 mL

## 2014-07-02 MED ORDER — FLUOXETINE HCL 20 MG PO CAPS
20.0000 mg | ORAL_CAPSULE | Freq: Every morning | ORAL | Status: DC
  Administered 2014-07-03: 20 mg via ORAL
  Filled 2014-07-02: qty 1

## 2014-07-02 MED ORDER — DEXTROMETHORPHAN-GUAIFENESIN 10-100 MG/5ML PO LIQD
10.0000 mL | ORAL | Status: DC | PRN
  Filled 2014-07-02: qty 10

## 2014-07-02 SURGICAL SUPPLY — 24 items
BAG URINE DRAINAGE (UROLOGICAL SUPPLIES) IMPLANT
BAG URO CATCHER STRL LF (DRAPE) ×4 IMPLANT
CATH FOLEY 2WAY SLVR  5CC 20FR (CATHETERS) ×2
CATH FOLEY 2WAY SLVR 5CC 20FR (CATHETERS) IMPLANT
CATH FOLEY 3WAY 30CC 22FR (CATHETERS) IMPLANT
CATH URET 5FR 28IN OPEN ENDED (CATHETERS) IMPLANT
CLOTH BEACON ORANGE TIMEOUT ST (SAFETY) ×4 IMPLANT
DRAPE CAMERA CLOSED 9X96 (DRAPES) ×4 IMPLANT
ELECT BUTTON HF 24-28F 2 30DE (ELECTRODE) ×4 IMPLANT
ELECT LOOP MED HF 24F 12D (CUTTING LOOP) ×4 IMPLANT
ELECT LOOP MED HF 24F 12D CBL (CLIP) ×4 IMPLANT
ELECT RESECT VAPORIZE 12D CBL (ELECTRODE) ×4 IMPLANT
GLOVE SURG SS PI 8.0 STRL IVOR (GLOVE) IMPLANT
GOWN STRL REUS W/TWL XL LVL3 (GOWN DISPOSABLE) ×4 IMPLANT
HOLDER FOLEY CATH W/STRAP (MISCELLANEOUS) IMPLANT
KIT ASPIRATION TUBING (SET/KITS/TRAYS/PACK) ×4 IMPLANT
MANIFOLD NEPTUNE II (INSTRUMENTS) ×4 IMPLANT
PACK CYSTO (CUSTOM PROCEDURE TRAY) ×4 IMPLANT
SUT ETHILON 3 0 PS 1 (SUTURE) IMPLANT
SYR 30ML LL (SYRINGE) IMPLANT
SYRINGE 12CC LL (MISCELLANEOUS) ×2 IMPLANT
SYRINGE IRR TOOMEY STRL 70CC (SYRINGE) IMPLANT
TUBING CONNECTING 10 (TUBING) ×3 IMPLANT
TUBING CONNECTING 10' (TUBING) ×1

## 2014-07-02 NOTE — Interval H&P Note (Signed)
History and Physical Interval Note:  07/02/2014 10:22 AM  April Pearson  has presented today for surgery, with the diagnosis of BLADDER CANCER   The various methods of treatment have been discussed with the patient and family. After consideration of risks, benefits and other options for treatment, the patient has consented to  Procedure(s): CYSTOSCOPY WITH BILATERAL RETROGRADE PYELOGRAM (Bilateral) TRANSURETHRAL RESECTION OF BLADDER TUMOR (TURBT) (N/A) as a surgical intervention .  The patient's history has been reviewed, patient examined, no change in status, stable for surgery.  I have reviewed the patient's chart and labs.  Questions were answered to the patient's satisfaction.     Bee Hammerschmidt J

## 2014-07-02 NOTE — Op Note (Signed)
April Pearson, April Pearson              ACCOUNT NO.:  [id number]  MEDICAL RECORD NO.:  [id number]  LOCATION:  [facility]                         FACILITY:  [facility]  PHYSICIAN:  Marshall Cork. Jeffie Pollock, M.D.    DATE OF BIRTH:  09/09/1941  DATE OF PROCEDURE:  07/02/2014 DATE OF DISCHARGE:                              OPERATIVE REPORT   PROCEDURE:  Cystoscopy with bilateral retrograde pyelograms with interpretation and transurethral resection of medium bladder tumor.  PREOPERATIVE DIAGNOSIS:  Recurrent bladder tumor.  POSTOPERATIVE DIAGNOSIS:  Recurrent bladder tumor.  SURGEON:  Marshall Cork. Jeffie Pollock, MD  ANESTHESIA:  General.  SPECIMEN:  Bladder tumor chips.  DRAINS:  A 20-French Foley catheter.  COMPLICATIONS:  None.  INDICATIONS:  Ms. Dimaria is a 73 year old white female with a history of bladder cancer and carcinoma in situ who has undergone previous BCG therapy and was found to have recurrent tumor on recent surveillance cystoscopy.  She is to undergo re-resection and retrograde pyelography.  FINDINGS OF PROCEDURE:  She was given Cipro.  She was taken to the operating room where general anesthetic was induced.  She was placed in lithotomy position and fitted with PAS hose.  Her perineum and genitalia were prepped with Betadine solution.  Cystoscopy was performed using a 22-French scope and 12 and 70 degree lenses.  Examination revealed a normal urethra.  There was recurrent papillary tumor with surrounding probable carcinoma in situ involving the right bladder neck and lateral wall, right trigone in the periureteral area, and posterior bladder neck.  There was a stellate scar just superior to the tumor that surrounded the orifice on the right.  The left ureteral orifice was unremarkable.  The remainder of the bladder mucosa was unremarkable with mild trabeculation.  Once the initial inspection had been performed, the right ureteral orifice was cannulated with 5-French open-end catheter and contrast  was instilled.  Right retrograde pyelogram revealed a normal ureter and intrarenal collecting system without worrisome filling defects.  The left ureter was then cannulated with a 5-French open-end catheter and contrast was instilled.  The left retrograde pyelogram demonstrated a normal ureter and intrarenal collecting system without filling defects.  The cystoscope was then removed and it was replaced with a 28-French continuous flow resectoscope sheath with an Simpson handle, a Gyrus bipolar loop, and 12-degree lens.  Saline was used as the irrigant.  Resection was initiated with the tumor medial to the trigone and 2 chips with resection into the muscle were obtained.  During the resection, the patient was noted to have a fair amount of bladder movement respiration that happened at interval that made deeper resection laterally problematic.  So once good chips were obtained from the tumor at the bladder neck in medial trigone, the remainder of the tumor which appeared superficial but spreading more consistent with carcinoma in situ was generously fulgurated throughout its entire extent.  I did resect additional mucosa adjacent to the ureteral orifice to avoid successive cautery in this area.  Once the entire tumor had been either resected or fulgurated, the chips were removed and the area was inspected for hemostasis.  The final area of involvement was approximately 3 x 5 cm.  At this point,  the resectoscope was removed and a 20-French Foley catheter was inserted.  The balloon was filled with 10 mL of sterile fluid.  The irrigant returned clear and the catheter was placed to straight drainage.  The specimen was passed off to Pathology.  The patient was taken down from lithotomy position.  Her anesthetic was reversed, and she was moved to recovery room in stable condition.  There were no complications.     Marshall Cork. Jeffie Pollock, M.D.     JJW/MEDQ  D:  07/02/2014  T:   07/02/2014  Job:  [id number]

## 2014-07-02 NOTE — Discharge Instructions (Signed)
Cystoscopy, Care After   Refer to this sheet in the next few weeks. These instructions provide you with information on caring for yourself after your procedure. Your caregiver may also give you more specific instructions. Your treatment has been planned according to current medical practices, but problems sometimes occur. Call your caregiver if you have any problems or questions after your procedure.   HOME CARE INSTRUCTIONS   Things you can do to ease any discomfort after your procedure include:   Drinking enough water and fluids to keep your urine clear or pale yellow.   Taking a warm bath to relieve any burning feelings.  SEEK IMMEDIATE MEDICAL CARE IF:   You have an increase in blood in your urine.   You notice blood clots in your urine.   You have difficulty passing urine.   You have the chills.   You have abdominal pain.   You have a fever or persistent symptoms for more than 2-3 days.   You have a fever and your symptoms suddenly get worse.  MAKE SURE YOU:   Understand these instructions.   Will watch your condition.   Will get help right away if you are not doing well or get worse.  Document Released: 05/19/2005 Document Revised: 07/02/2013 Document Reviewed: 04/22/2012   ExitCare® Patient Information ©2015 [employer]. This information is not intended to replace advice given to you by your health care provider. Make sure you discuss any questions you have with your health care provider.

## 2014-07-02 NOTE — Transfer of Care (Signed)
Immediate Anesthesia Transfer of Care Note  Patient: April Pearson  Procedure(s) Performed: Procedure(s): CYSTOSCOPY WITH BILATERAL RETROGRADE PYELOGRAM (Bilateral) TRANSURETHRAL RESECTION OF BLADDER TUMOR (TURBT) (N/A)  Patient Location: PACU  Anesthesia Type:General  Level of Consciousness: awake, alert , oriented and patient cooperative  Airway & Oxygen Therapy: Patient Spontanous Breathing and Patient connected to face mask oxygen  Post-op Assessment: Report given to PACU RN, Post -op Vital signs reviewed and stable and Patient moving all extremities  Post vital signs: Reviewed and stable  Complications: No apparent anesthesia complications

## 2014-07-02 NOTE — Anesthesia Preprocedure Evaluation (Signed)
Anesthesia Evaluation  Patient identified by MRN, date of birth, ID band Patient awake    Reviewed: Allergy & Precautions, H&P , NPO status , Patient's Chart, lab work & pertinent test results  Airway Mallampati: II TM Distance: >3 FB Neck ROM: Full    Dental no notable dental hx.    Pulmonary COPD oxygen dependent, former smoker,  Emphysema breath sounds clear to auscultation  + decreased breath sounds      Cardiovascular hypertension, Rhythm:Regular Rate:Normal     Neuro/Psych negative neurological ROS  negative psych ROS   GI/Hepatic negative GI ROS, Neg liver ROS,   Endo/Other  negative endocrine ROS  Renal/GU negative Renal ROS  negative genitourinary   Musculoskeletal negative musculoskeletal ROS (+)   Abdominal   Peds negative pediatric ROS (+)  Hematology negative hematology ROS (+)   Anesthesia Other Findings   Reproductive/Obstetrics negative OB ROS                           Anesthesia Physical Anesthesia Plan  ASA: III  Anesthesia Plan: General   Post-op Pain Management:    Induction: Intravenous  Airway Management Planned: LMA  Additional Equipment:   Intra-op Plan:   Post-operative Plan:   Informed Consent: I have reviewed the patients History and Physical, chart, labs and discussed the procedure including the risks, benefits and alternatives for the proposed anesthesia with the patient or authorized representative who has indicated his/her understanding and acceptance.   Dental advisory given  Plan Discussed with: CRNA and Surgeon  Anesthesia Plan Comments:         Anesthesia Quick Evaluation

## 2014-07-02 NOTE — Brief Op Note (Signed)
07/02/2014  11:05 AM  PATIENT:  April Pearson  73 y.o. female  PRE-OPERATIVE DIAGNOSIS:  BLADDER CANCER   POST-OPERATIVE DIAGNOSIS:  BLADDER CANCER   PROCEDURE:  Procedure(s): CYSTOSCOPY WITH BILATERAL RETROGRADE PYELOGRAM (Bilateral) TRANSURETHRAL RESECTION OF BLADDER TUMOR (TURBT) (N/A) 2-5cm  SURGEON:  Surgeon(s) and Role:    * Malka So, MD - Primary  PHYSICIAN ASSISTANT:   ASSISTANTS: none   ANESTHESIA:   general  EBL:     BLOOD ADMINISTERED:none  DRAINS: Urinary Catheter (Foley)   LOCAL MEDICATIONS USED:  NONE  SPECIMEN:  Source of Specimen:  bladder tumor chips  DISPOSITION OF SPECIMEN:  PATHOLOGY  COUNTS:  YES  TOURNIQUET:  * No tourniquets in log *  DICTATION: .Other Dictation: Dictation Number [id number]  PLAN OF CARE: Admit for overnight observation  PATIENT DISPOSITION:  PACU - hemodynamically stable.   Delay start of Pharmacological VTE agent (>24hrs) due to surgical blood loss or risk of bleeding: yes

## 2014-07-02 NOTE — Anesthesia Postprocedure Evaluation (Signed)
  Anesthesia Post-op Note  Patient: April Pearson  Procedure(s) Performed: Procedure(s) (LRB): CYSTOSCOPY WITH BILATERAL RETROGRADE PYELOGRAM (Bilateral) TRANSURETHRAL RESECTION OF BLADDER TUMOR (TURBT) (N/A)  Patient Location: PACU  Anesthesia Type: General  Level of Consciousness: awake and alert   Airway and Oxygen Therapy: Patient Spontanous Breathing  Post-op Pain: mild  Post-op Assessment: Post-op Vital signs reviewed, Patient's Cardiovascular Status Stable, Respiratory Function Stable, Patent Airway and No signs of Nausea or vomiting  Last Vitals:  Filed Vitals:   07/02/14 1118  BP: 137/72  Pulse: 81  Temp: 37 C  Resp: 15    Post-op Vital Signs: stable   Complications: No apparent anesthesia complications

## 2014-07-02 NOTE — Progress Notes (Signed)
Patient ID: April Pearson, female   DOB: 29-Jul-1941, 73 y.o.   MRN: [id number] Urine is clear and she is without complaints.   I will remove the foley in the AM and send her home when voiding.

## 2014-07-02 NOTE — Progress Notes (Signed)

## 2014-07-03 ENCOUNTER — Encounter (HOSPITAL_COMMUNITY): Payer: Self-pay | Admitting: Urology

## 2014-07-03 DIAGNOSIS — C679 Malignant neoplasm of bladder, unspecified: Secondary | ICD-10-CM | POA: Diagnosis not present

## 2014-07-03 NOTE — Progress Notes (Signed)
CSW received consult that patient was admitted from [facility]. Patient to be discharged today - CSW confirmed with Tammy @ [facility] that they are able to take patient back. D/C Summary faxed to [phone]. RN, Estill Bamberg to call patient's sister, [name] for transportation when ready for discharge.   Raynaldo Opitz, [facility] Clinical Social Worker cell #: [phone]

## 2014-07-03 NOTE — Discharge Summary (Signed)
Physician Discharge Summary  Patient ID: April Pearson MRN: [id number] DOB/AGE: 1941-06-16 73 y.o.  Admit date: 07/02/2014 Discharge date: 07/03/2014  Admission Diagnoses:  Bladder cancer  Discharge Diagnoses:  Principal Problem:   Bladder cancer Active Problems:   COPD with acute exacerbation   Past Medical History  Diagnosis Date  . COPD (chronic obstructive pulmonary disease)   . Osteoporosis   . Anxiety   . Weakness   . Tachycardia   . Cancer     bladder  . Depression   . Arthritis     KNEES AND HANDS  . Coarse tremors     TREMORS BOTH HANDS - PT STATES SIDE EFFECT OF HER MEDICATIONS FOR HER BREATHING  . Dizziness     sometimes  . Shortness of breath     USES OXYGEN 2 L / MIN NASAL CANNUA   24 HRS A DAY; LIV[facility] IN [name]--CAN AMBULATE SHORT DISTANCE BUT ACTIVITIES VERY LIMITED BY SOB  . Anginal pain     "with fluttering, seen MD about it"  . History of shingles   . Emphysema   . GERD (gastroesophageal reflux disease)     sometimes  . Laryngitis     10/2013, hx of  . Pneumonia     hx of  . Bipolar 1 disorder   . Cough 06/29/14    pt states recent cough / cold - feeling better - cough now non-productive.  . Frequent urination     Surgeries: Procedure(s): CYSTOSCOPY WITH BILATERAL RETROGRADE PYELOGRAM TRANSURETHRAL RESECTION OF BLADDER TUMOR (TURBT) on 07/02/2014   Consultants (if any):    Discharged Condition: Improved  Hospital Course: April Pearson is an 73 y.o. female who was admitted 07/02/2014 with a diagnosis of Bladder cancer and went to the operating room on 07/02/2014 and underwent the above named procedures.  She was kept overnight because of her pulmonary history but she did well.  Her urine remained clear and the foley was removed this morning.  She was discharged home when voiding.   She was given perioperative antibiotics:  Anti-infectives   Start     Dose/Rate Route Frequency Ordered Stop   07/02/14 2000  ciprofloxacin (CIPRO) tablet 250 mg     250 mg Oral 2 times daily 07/02/14 1215     07/02/14 0825  ciprofloxacin (CIPRO) IVPB 400 mg     400 mg 200 mL/hr over 60 Minutes Intravenous 60 min pre-op 07/02/14 0825 07/02/14 1035    .  She was given sequential compression devices for DVT prophylaxis.  She benefited maximally from the hospital stay and there were no complications.    Recent vital signs:  Filed Vitals:   07/03/14 0547  BP: 122/84  Pulse: 71  Temp: 98.1 F (36.7 C)  Resp: 16    Recent laboratory studies:  Lab Results  Component Value Date   HGB 11.0* 06/29/2014   HGB 11.2* 12/29/2013   HGB 10.7* 12/22/2013   Lab Results  Component Value Date   WBC 6.9 06/29/2014   PLT 318 06/29/2014   No results found for this basename: INR   Lab Results  Component Value Date   NA 143 06/29/2014   K 3.8 06/29/2014   CL 96 06/29/2014   CO2 36* 06/29/2014   BUN 15 06/29/2014   CREATININE 0.93 06/29/2014   GLUCOSE 119* 06/29/2014    Discharge Medications:     Medication List         acetaminophen  325 MG tablet  Commonly known as:  TYLENOL  Take 325 mg by mouth 4 (four) times daily as needed for mild pain or moderate pain.     albuterol (2.5 MG/3ML) 0.083% nebulizer solution  Commonly known as:  PROVENTIL  Take 2.5 mg by nebulization 3 (three) times daily.     alendronate 70 MG tablet  Commonly known as:  FOSAMAX  Take 70 mg by mouth every Friday. Take with a full glass of water on an empty stomach.     ALPRAZolam 1 MG tablet  Commonly known as:  XANAX  Take 1 mg by mouth at bedtime.     aspirin EC 81 MG tablet  Take 81 mg by mouth daily.     budesonide-formoterol 160-4.5 MCG/ACT inhaler  Commonly known as:  SYMBICORT  Inhale 2 puffs into the lungs 2 (two) times daily.     calcium-vitamin D 500-200 MG-UNIT per tablet  Commonly known as:  OSCAL WITH D  Take 1 tablet by mouth 2 (two) times daily.     cetirizine 10 MG tablet  Commonly known as:   ZYRTEC  Take 10 mg by mouth at bedtime.     dextromethorphan-guaiFENesin 10-100 MG/5ML liquid  Commonly known as:  ROBITUSSIN-DM  Take 10 mLs by mouth every 4 (four) hours as needed for cough.     diltiazem 240 MG 24 hr capsule  Commonly known as:  TIAZAC  Take 240 mg by mouth every morning.     ferrous sulfate 325 (65 FE) MG tablet  Take 325 mg by mouth daily.     FLUoxetine 20 MG tablet  Commonly known as:  PROZAC  Take 20 mg by mouth every morning.     fluticasone 50 MCG/ACT nasal spray  Commonly known as:  FLONASE  Place 2 sprays into the nose daily.     furosemide 40 MG tablet  Commonly known as:  LASIX  Take 40 mg by mouth every morning.     multivitamin with minerals Tabs tablet  Take 1 tablet by mouth daily.     risperiDONE 0.5 MG tablet  Commonly known as:  RISPERDAL  Take 0.5 mg by mouth at bedtime.     tiotropium 18 MCG inhalation capsule  Commonly known as:  SPIRIVA  Place 18 mcg into inhaler and inhale every morning.        Diagnostic Studies: Path report pending.    Bilateral Retrograde Pyelograms were normal.    Disposition: 01-Home or Self Care        Follow-up Information   Follow up with Malka So, MD On 07/17/2014. [phone])    Specialty:  Urology   Contact information:   [address] [place] [address] [phone]        Signed: Malka So 07/03/2014, 7:27 AM

## 2014-07-17 ENCOUNTER — Ambulatory Visit (INDEPENDENT_AMBULATORY_CARE_PROVIDER_SITE_OTHER): Payer: PRIVATE HEALTH INSURANCE | Admitting: Urology

## 2014-07-17 DIAGNOSIS — C675 Malignant neoplasm of bladder neck: Secondary | ICD-10-CM

## 2014-07-24 ENCOUNTER — Ambulatory Visit (INDEPENDENT_AMBULATORY_CARE_PROVIDER_SITE_OTHER): Payer: PRIVATE HEALTH INSURANCE | Admitting: Urology

## 2014-07-24 DIAGNOSIS — C675 Malignant neoplasm of bladder neck: Secondary | ICD-10-CM

## 2014-07-30 ENCOUNTER — Ambulatory Visit (INDEPENDENT_AMBULATORY_CARE_PROVIDER_SITE_OTHER): Payer: PRIVATE HEALTH INSURANCE | Admitting: Internal Medicine

## 2014-08-06 ENCOUNTER — Encounter (INDEPENDENT_AMBULATORY_CARE_PROVIDER_SITE_OTHER): Payer: Self-pay | Admitting: *Deleted

## 2014-08-06 ENCOUNTER — Telehealth (INDEPENDENT_AMBULATORY_CARE_PROVIDER_SITE_OTHER): Payer: Self-pay | Admitting: *Deleted

## 2014-08-06 NOTE — Telephone Encounter (Signed)
April Pearson NO SHOWED for her apt with Deberah Castle, NP on 07/30/14. A NS letter has been mailed.

## 2014-08-14 ENCOUNTER — Ambulatory Visit (INDEPENDENT_AMBULATORY_CARE_PROVIDER_SITE_OTHER): Payer: PRIVATE HEALTH INSURANCE | Admitting: Urology

## 2014-08-14 DIAGNOSIS — C675 Malignant neoplasm of bladder neck: Secondary | ICD-10-CM

## 2014-08-21 ENCOUNTER — Ambulatory Visit (INDEPENDENT_AMBULATORY_CARE_PROVIDER_SITE_OTHER): Payer: PRIVATE HEALTH INSURANCE | Admitting: Urology

## 2014-08-21 DIAGNOSIS — C675 Malignant neoplasm of bladder neck: Secondary | ICD-10-CM

## 2014-08-28 ENCOUNTER — Ambulatory Visit (INDEPENDENT_AMBULATORY_CARE_PROVIDER_SITE_OTHER): Payer: PRIVATE HEALTH INSURANCE | Admitting: Urology

## 2014-08-28 DIAGNOSIS — C675 Malignant neoplasm of bladder neck: Secondary | ICD-10-CM

## 2014-09-04 ENCOUNTER — Ambulatory Visit (INDEPENDENT_AMBULATORY_CARE_PROVIDER_SITE_OTHER): Payer: PRIVATE HEALTH INSURANCE | Admitting: Urology

## 2014-09-04 DIAGNOSIS — C675 Malignant neoplasm of bladder neck: Secondary | ICD-10-CM

## 2014-09-11 ENCOUNTER — Ambulatory Visit (INDEPENDENT_AMBULATORY_CARE_PROVIDER_SITE_OTHER): Payer: PRIVATE HEALTH INSURANCE | Admitting: Urology

## 2014-09-11 DIAGNOSIS — C675 Malignant neoplasm of bladder neck: Secondary | ICD-10-CM

## 2014-09-18 ENCOUNTER — Ambulatory Visit (INDEPENDENT_AMBULATORY_CARE_PROVIDER_SITE_OTHER): Payer: PRIVATE HEALTH INSURANCE | Admitting: Urology

## 2014-09-18 DIAGNOSIS — C675 Malignant neoplasm of bladder neck: Secondary | ICD-10-CM

## 2014-10-02 ENCOUNTER — Ambulatory Visit (INDEPENDENT_AMBULATORY_CARE_PROVIDER_SITE_OTHER): Payer: PRIVATE HEALTH INSURANCE | Admitting: Urology

## 2014-10-02 DIAGNOSIS — C675 Malignant neoplasm of bladder neck: Secondary | ICD-10-CM

## 2014-11-06 ENCOUNTER — Inpatient Hospital Stay (HOSPITAL_COMMUNITY)
Admission: EM | Admit: 2014-11-06 | Discharge: 2014-11-14 | DRG: 871 | Disposition: A | Discharge status: Nursing Home (Includes ICF) | Payer: Medicare Other | Attending: Pulmonary Disease | Admitting: Pulmonary Disease

## 2014-11-06 ENCOUNTER — Emergency Department (HOSPITAL_COMMUNITY): Payer: Medicare Other

## 2014-11-06 ENCOUNTER — Encounter (HOSPITAL_COMMUNITY): Payer: Self-pay

## 2014-11-06 DIAGNOSIS — Z9981 Dependence on supplemental oxygen: Secondary | ICD-10-CM

## 2014-11-06 DIAGNOSIS — J441 Chronic obstructive pulmonary disease with (acute) exacerbation: Secondary | ICD-10-CM | POA: Diagnosis present

## 2014-11-06 DIAGNOSIS — Y95 Nosocomial condition: Secondary | ICD-10-CM | POA: Diagnosis present

## 2014-11-06 DIAGNOSIS — J9690 Respiratory failure, unspecified, unspecified whether with hypoxia or hypercapnia: Secondary | ICD-10-CM | POA: Diagnosis present

## 2014-11-06 DIAGNOSIS — M810 Age-related osteoporosis without current pathological fracture: Secondary | ICD-10-CM | POA: Diagnosis present

## 2014-11-06 DIAGNOSIS — J9691 Respiratory failure, unspecified with hypoxia: Secondary | ICD-10-CM | POA: Diagnosis not present

## 2014-11-06 DIAGNOSIS — I129 Hypertensive chronic kidney disease with stage 1 through stage 4 chronic kidney disease, or unspecified chronic kidney disease: Secondary | ICD-10-CM | POA: Diagnosis present

## 2014-11-06 DIAGNOSIS — N179 Acute kidney failure, unspecified: Secondary | ICD-10-CM

## 2014-11-06 DIAGNOSIS — C679 Malignant neoplasm of bladder, unspecified: Secondary | ICD-10-CM | POA: Diagnosis present

## 2014-11-06 DIAGNOSIS — K219 Gastro-esophageal reflux disease without esophagitis: Secondary | ICD-10-CM | POA: Diagnosis present

## 2014-11-06 DIAGNOSIS — J189 Pneumonia, unspecified organism: Secondary | ICD-10-CM | POA: Diagnosis present

## 2014-11-06 DIAGNOSIS — R000 Tachycardia, unspecified: Secondary | ICD-10-CM | POA: Diagnosis present

## 2014-11-06 DIAGNOSIS — N289 Disorder of kidney and ureter, unspecified: Secondary | ICD-10-CM

## 2014-11-06 DIAGNOSIS — N189 Chronic kidney disease, unspecified: Secondary | ICD-10-CM | POA: Diagnosis present

## 2014-11-06 DIAGNOSIS — R509 Fever, unspecified: Secondary | ICD-10-CM | POA: Diagnosis present

## 2014-11-06 DIAGNOSIS — A419 Sepsis, unspecified organism: Principal | ICD-10-CM | POA: Diagnosis present

## 2014-11-06 DIAGNOSIS — R062 Wheezing: Secondary | ICD-10-CM

## 2014-11-06 DIAGNOSIS — D509 Iron deficiency anemia, unspecified: Secondary | ICD-10-CM | POA: Diagnosis present

## 2014-11-06 DIAGNOSIS — F319 Bipolar disorder, unspecified: Secondary | ICD-10-CM | POA: Diagnosis present

## 2014-11-06 DIAGNOSIS — M1990 Unspecified osteoarthritis, unspecified site: Secondary | ICD-10-CM | POA: Diagnosis present

## 2014-11-06 DIAGNOSIS — R0602 Shortness of breath: Secondary | ICD-10-CM

## 2014-11-06 DIAGNOSIS — Z87891 Personal history of nicotine dependence: Secondary | ICD-10-CM

## 2014-11-06 DIAGNOSIS — D72829 Elevated white blood cell count, unspecified: Secondary | ICD-10-CM | POA: Diagnosis present

## 2014-11-06 DIAGNOSIS — Z66 Do not resuscitate: Secondary | ICD-10-CM | POA: Diagnosis present

## 2014-11-06 DIAGNOSIS — J449 Chronic obstructive pulmonary disease, unspecified: Secondary | ICD-10-CM | POA: Diagnosis not present

## 2014-11-06 DIAGNOSIS — I10 Essential (primary) hypertension: Secondary | ICD-10-CM | POA: Diagnosis present

## 2014-11-06 DIAGNOSIS — D649 Anemia, unspecified: Secondary | ICD-10-CM | POA: Diagnosis present

## 2014-11-06 LAB — CBC WITH DIFFERENTIAL/PLATELET
BLASTS: 0 %
Band Neutrophils: 4 % (ref 0–10)
Basophils Absolute: 0 10*3/uL (ref 0.0–0.1)
Basophils Relative: 0 % (ref 0–1)
Eosinophils Absolute: 0 10*3/uL (ref 0.0–0.7)
Eosinophils Relative: 0 % (ref 0–5)
HCT: 30.2 % — ABNORMAL LOW (ref 36.0–46.0)
Hemoglobin: 9.5 g/dL — ABNORMAL LOW (ref 12.0–15.0)
LYMPHS ABS: 0.9 10*3/uL (ref 0.7–4.0)
LYMPHS PCT: 2 % — AB (ref 12–46)
MCH: 30.5 pg (ref 26.0–34.0)
MCHC: 31.5 g/dL (ref 30.0–36.0)
MCV: 97.1 fL (ref 78.0–100.0)
MYELOCYTES: 0 %
Metamyelocytes Relative: 0 %
Monocytes Absolute: 3 10*3/uL — ABNORMAL HIGH (ref 0.1–1.0)
Monocytes Relative: 7 % (ref 3–12)
NEUTROS PCT: 87 % — AB (ref 43–77)
Neutro Abs: 39.4 10*3/uL — ABNORMAL HIGH (ref 1.7–7.7)
Platelets: 296 10*3/uL (ref 150–400)
Promyelocytes Absolute: 0 %
RBC: 3.11 MIL/uL — ABNORMAL LOW (ref 3.87–5.11)
RDW: 15.5 % (ref 11.5–15.5)
WBC: 43.3 10*3/uL — ABNORMAL HIGH (ref 4.0–10.5)
nRBC: 0 /100 WBC

## 2014-11-06 LAB — URINALYSIS, ROUTINE W REFLEX MICROSCOPIC
Bilirubin Urine: NEGATIVE
GLUCOSE, UA: NEGATIVE mg/dL
Hgb urine dipstick: NEGATIVE
KETONES UR: NEGATIVE mg/dL
Nitrite: NEGATIVE
Protein, ur: 30 mg/dL — AB
SPECIFIC GRAVITY, URINE: 1.015 (ref 1.005–1.030)
Urobilinogen, UA: 0.2 mg/dL (ref 0.0–1.0)
pH: 8 (ref 5.0–8.0)

## 2014-11-06 LAB — HEPATIC FUNCTION PANEL
ALK PHOS: 66 U/L (ref 39–117)
ALT: 21 U/L (ref 0–35)
AST: 28 U/L (ref 0–37)
Albumin: 3.5 g/dL (ref 3.5–5.2)
BILIRUBIN DIRECT: 0.1 mg/dL (ref 0.0–0.3)
BILIRUBIN INDIRECT: 0.3 mg/dL (ref 0.3–0.9)
BILIRUBIN TOTAL: 0.4 mg/dL (ref 0.3–1.2)
TOTAL PROTEIN: 6.2 g/dL (ref 6.0–8.3)

## 2014-11-06 LAB — LACTIC ACID, PLASMA: Lactic Acid, Venous: 1.5 mmol/L (ref 0.5–2.2)

## 2014-11-06 LAB — INFLUENZA PANEL BY PCR (TYPE A & B)
H1N1 flu by pcr: NOT DETECTED
INFLAPCR: NEGATIVE
INFLBPCR: NEGATIVE

## 2014-11-06 LAB — BASIC METABOLIC PANEL
ANION GAP: 8 (ref 5–15)
BUN: 22 mg/dL (ref 6–23)
CALCIUM: 8.8 mg/dL (ref 8.4–10.5)
CO2: 31 mmol/L (ref 19–32)
CREATININE: 1.51 mg/dL — AB (ref 0.50–1.10)
Chloride: 99 mEq/L (ref 96–112)
GFR calc non Af Amer: 33 mL/min — ABNORMAL LOW (ref >90)
GFR, EST AFRICAN AMERICAN: 38 mL/min — AB (ref >90)
Glucose, Bld: 138 mg/dL — ABNORMAL HIGH (ref 70–99)
Potassium: 3.7 mmol/L (ref 3.5–5.1)
Sodium: 138 mmol/L (ref 135–145)

## 2014-11-06 LAB — URINE MICROSCOPIC-ADD ON

## 2014-11-06 LAB — BRAIN NATRIURETIC PEPTIDE: B Natriuretic Peptide: 269 pg/mL — ABNORMAL HIGH (ref 0.0–100.0)

## 2014-11-06 LAB — MRSA PCR SCREENING: MRSA by PCR: NEGATIVE

## 2014-11-06 MED ORDER — IPRATROPIUM-ALBUTEROL 0.5-2.5 (3) MG/3ML IN SOLN
3.0000 mL | Freq: Once | RESPIRATORY_TRACT | Status: AC
  Administered 2014-11-06: 3 mL via RESPIRATORY_TRACT
  Filled 2014-11-06: qty 3

## 2014-11-06 MED ORDER — VANCOMYCIN HCL IN DEXTROSE 1-5 GM/200ML-% IV SOLN
1000.0000 mg | Freq: Once | INTRAVENOUS | Status: AC
Start: 2014-11-06 — End: 2014-11-06
  Administered 2014-11-06: 1000 mg via INTRAVENOUS
  Filled 2014-11-06: qty 200

## 2014-11-06 MED ORDER — CETYLPYRIDINIUM CHLORIDE 0.05 % MT LIQD
7.0000 mL | Freq: Two times a day (BID) | OROMUCOSAL | Status: DC
  Administered 2014-11-06 – 2014-11-14 (×14): 7 mL via OROMUCOSAL

## 2014-11-06 MED ORDER — ACETAMINOPHEN 500 MG PO TABS
1000.0000 mg | ORAL_TABLET | Freq: Once | ORAL | Status: AC
  Administered 2014-11-06: 1000 mg via ORAL
  Filled 2014-11-06: qty 2

## 2014-11-06 MED ORDER — SODIUM CHLORIDE 0.9 % IV SOLN: Freq: Once | INTRAVENOUS | Status: DC

## 2014-11-06 MED ORDER — ENOXAPARIN SODIUM 40 MG/0.4ML ~~LOC~~ SOLN
40.0000 mg | SUBCUTANEOUS | Status: DC
  Administered 2014-11-06 – 2014-11-10 (×5): 40 mg via SUBCUTANEOUS
  Filled 2014-11-06 (×5): qty 0.4

## 2014-11-06 MED ORDER — IPRATROPIUM BROMIDE 0.02 % IN SOLN: 0.5000 mg | Freq: Once | RESPIRATORY_TRACT | Status: DC

## 2014-11-06 MED ORDER — SODIUM CHLORIDE 0.9 % IV BOLUS (SEPSIS)
1000.0000 mL | Freq: Once | INTRAVENOUS | Status: AC
  Administered 2014-11-06: 1000 mL via INTRAVENOUS

## 2014-11-06 MED ORDER — BUDESONIDE-FORMOTEROL FUMARATE 160-4.5 MCG/ACT IN AERO
2.0000 | INHALATION_SPRAY | Freq: Two times a day (BID) | RESPIRATORY_TRACT | Status: DC
  Administered 2014-11-06 – 2014-11-14 (×14): 2 via RESPIRATORY_TRACT
  Filled 2014-11-06 (×3): qty 6

## 2014-11-06 MED ORDER — VANCOMYCIN HCL 10 G IV SOLR
1250.0000 mg | INTRAVENOUS | Status: DC
  Administered 2014-11-07 – 2014-11-10 (×4): 1250 mg via INTRAVENOUS
  Filled 2014-11-06 (×5): qty 1250

## 2014-11-06 MED ORDER — ALBUTEROL SULFATE (2.5 MG/3ML) 0.083% IN NEBU: 2.5000 mg | INHALATION_SOLUTION | Freq: Four times a day (QID) | RESPIRATORY_TRACT | Status: DC

## 2014-11-06 MED ORDER — ALBUTEROL SULFATE (2.5 MG/3ML) 0.083% IN NEBU
2.5000 mg | INHALATION_SOLUTION | Freq: Once | RESPIRATORY_TRACT | Status: AC
  Administered 2014-11-06: 2.5 mg via RESPIRATORY_TRACT
  Filled 2014-11-06: qty 3

## 2014-11-06 MED ORDER — FLUOXETINE HCL 20 MG PO TABS
20.0000 mg | ORAL_TABLET | Freq: Every morning | ORAL | Status: DC
  Filled 2014-11-06 (×3): qty 1

## 2014-11-06 MED ORDER — ASPIRIN 81 MG PO CHEW
324.0000 mg | CHEWABLE_TABLET | ORAL | Status: AC
  Administered 2014-11-06: 324 mg via ORAL
  Filled 2014-11-06: qty 4

## 2014-11-06 MED ORDER — PIPERACILLIN-TAZOBACTAM 3.375 G IVPB
INTRAVENOUS | Status: AC
  Filled 2014-11-06: qty 100

## 2014-11-06 MED ORDER — DILTIAZEM HCL ER BEADS 240 MG PO CP24
240.0000 mg | ORAL_CAPSULE | Freq: Every morning | ORAL | Status: DC
  Filled 2014-11-06 (×3): qty 1

## 2014-11-06 MED ORDER — TIOTROPIUM BROMIDE MONOHYDRATE 18 MCG IN CAPS
18.0000 ug | ORAL_CAPSULE | Freq: Every morning | RESPIRATORY_TRACT | Status: DC
  Filled 2014-11-06: qty 5

## 2014-11-06 MED ORDER — ASPIRIN 300 MG RE SUPP: 300.0000 mg | RECTAL | Status: AC

## 2014-11-06 MED ORDER — IPRATROPIUM BROMIDE 0.02 % IN SOLN: 0.5000 mg | Freq: Four times a day (QID) | RESPIRATORY_TRACT | Status: DC

## 2014-11-06 MED ORDER — SODIUM CHLORIDE 0.9 % IV SOLN
250.0000 mL | INTRAVENOUS | Status: DC | PRN
  Administered 2014-11-06: 10 mL/h via INTRAVENOUS

## 2014-11-06 MED ORDER — FLUTICASONE PROPIONATE 50 MCG/ACT NA SUSP
2.0000 | Freq: Every day | NASAL | Status: DC
  Administered 2014-11-07 – 2014-11-14 (×8): 2 via NASAL
  Filled 2014-11-06: qty 16

## 2014-11-06 MED ORDER — IPRATROPIUM-ALBUTEROL 0.5-2.5 (3) MG/3ML IN SOLN
3.0000 mL | Freq: Four times a day (QID) | RESPIRATORY_TRACT | Status: DC
  Administered 2014-11-06 – 2014-11-12 (×22): 3 mL via RESPIRATORY_TRACT
  Filled 2014-11-06 (×22): qty 3

## 2014-11-06 MED ORDER — LORATADINE 10 MG PO TABS
10.0000 mg | ORAL_TABLET | Freq: Every day | ORAL | Status: DC
  Administered 2014-11-06 – 2014-11-14 (×9): 10 mg via ORAL
  Filled 2014-11-06 (×9): qty 1

## 2014-11-06 MED ORDER — RISPERIDONE 0.5 MG PO TABS
0.5000 mg | ORAL_TABLET | Freq: Every day | ORAL | Status: DC
  Administered 2014-11-08 – 2014-11-13 (×7): 0.5 mg via ORAL
  Filled 2014-11-06 (×8): qty 1

## 2014-11-06 MED ORDER — DM-GUAIFENESIN ER 30-600 MG PO TB12
1.0000 | ORAL_TABLET | Freq: Two times a day (BID) | ORAL | Status: DC
  Administered 2014-11-06: 1 via ORAL
  Filled 2014-11-06 (×2): qty 1

## 2014-11-06 MED ORDER — ALBUTEROL SULFATE (2.5 MG/3ML) 0.083% IN NEBU
2.5000 mg | INHALATION_SOLUTION | RESPIRATORY_TRACT | Status: DC | PRN
  Administered 2014-11-07 – 2014-11-10 (×3): 2.5 mg via RESPIRATORY_TRACT
  Filled 2014-11-06 (×3): qty 3

## 2014-11-06 MED ORDER — ALBUTEROL SULFATE (2.5 MG/3ML) 0.083% IN NEBU: 5.0000 mg | INHALATION_SOLUTION | Freq: Once | RESPIRATORY_TRACT | Status: DC

## 2014-11-06 MED ORDER — PIPERACILLIN-TAZOBACTAM 3.375 G IVPB
3.3750 g | Freq: Three times a day (TID) | INTRAVENOUS | Status: DC
  Administered 2014-11-06 – 2014-11-11 (×14): 3.375 g via INTRAVENOUS
  Filled 2014-11-06 (×15): qty 50

## 2014-11-06 MED ORDER — SODIUM CHLORIDE 0.9 % IV BOLUS (SEPSIS): 500.0000 mL | Freq: Once | INTRAVENOUS | Status: DC

## 2014-11-06 MED ORDER — ASPIRIN EC 81 MG PO TBEC
81.0000 mg | DELAYED_RELEASE_TABLET | Freq: Every day | ORAL | Status: DC
  Administered 2014-11-07 – 2014-11-14 (×8): 81 mg via ORAL
  Filled 2014-11-06 (×9): qty 1

## 2014-11-06 MED ORDER — ONDANSETRON HCL 4 MG/2ML IJ SOLN: 4.0000 mg | Freq: Four times a day (QID) | INTRAMUSCULAR | Status: DC | PRN

## 2014-11-06 MED ORDER — ALENDRONATE SODIUM 70 MG PO TABS: 70.0000 mg | ORAL_TABLET | ORAL | Status: DC

## 2014-11-06 MED ORDER — ACETAMINOPHEN 325 MG PO TABS: 650.0000 mg | ORAL_TABLET | ORAL | Status: DC | PRN

## 2014-11-06 MED ORDER — BUDESONIDE-FORMOTEROL FUMARATE 160-4.5 MCG/ACT IN AERO
INHALATION_SPRAY | RESPIRATORY_TRACT | Status: AC
  Filled 2014-11-06: qty 6

## 2014-11-06 MED ORDER — PANTOPRAZOLE SODIUM 40 MG PO TBEC
40.0000 mg | DELAYED_RELEASE_TABLET | Freq: Every day | ORAL | Status: DC
  Administered 2014-11-06 – 2014-11-14 (×9): 40 mg via ORAL
  Filled 2014-11-06 (×9): qty 1

## 2014-11-06 MED ORDER — ALPRAZOLAM 1 MG PO TABS
1.0000 mg | ORAL_TABLET | Freq: Every day | ORAL | Status: DC
  Administered 2014-11-08 – 2014-11-13 (×7): 1 mg via ORAL
  Filled 2014-11-06: qty 1
  Filled 2014-11-06: qty 2
  Filled 2014-11-06: qty 1
  Filled 2014-11-06: qty 2
  Filled 2014-11-06: qty 1
  Filled 2014-11-06: qty 2
  Filled 2014-11-06 (×2): qty 1

## 2014-11-06 MED ORDER — POTASSIUM CHLORIDE IN NACL 20-0.9 MEQ/L-% IV SOLN
INTRAVENOUS | Status: DC
  Administered 2014-11-06 – 2014-11-08 (×4): via INTRAVENOUS
  Administered 2014-11-13: 10 mL/h via INTRAVENOUS

## 2014-11-06 MED ORDER — PIPERACILLIN-TAZOBACTAM 3.375 G IVPB 30 MIN
2.2500 g | Freq: Once | INTRAVENOUS | Status: AC
  Administered 2014-11-06: 3.375 g via INTRAVENOUS
  Filled 2014-11-06: qty 50

## 2014-11-06 NOTE — ED Notes (Signed)
Pt here from [facility] for evaluation of fever and congestion. Pt was given motrin 800 mg po for a fever of 101.8

## 2014-11-06 NOTE — ED Provider Notes (Signed)
CSN: [id number]     Arrival date & time 11/06/14  1345 History  This chart was scribed for Maudry Diego, MD by Edison Simon, ED Scribe. This patient was seen in room APA18/APA18 and the patient's care was started at 2:17 PM.    Chief Complaint  Patient presents with  . Fever   Patient is a 73 y.o. female presenting with fever. The history is provided by the patient. No language interpreter was used.  Fever Max temp prior to arrival:  101.8 Severity:  Moderate Onset quality:  Sudden Duration:  1 day Timing:  Constant Progression:  Worsening Chronicity:  New Relieved by:  Nothing Worsened by:  Nothing tried Ineffective treatments:  None tried Associated symptoms: congestion and cough   Associated symptoms: no chest pain, no diarrhea, no headaches and no rash     HPI Comments: [facility] is a 73 y.o. female who presents to the Emergency Department complaining of fever and congestion with onset yesterday, referred here from [facility]. She reports associated difficulty breathing and cough.  Past Medical History  Diagnosis Date  . COPD (chronic obstructive pulmonary disease)   . Osteoporosis   . Anxiety   . Weakness   . Tachycardia   . Cancer     bladder  . Depression   . Arthritis     KNEES AND HANDS  . Coarse tremors     TREMORS BOTH HANDS - PT STATES SIDE EFFECT OF HER MEDICATIONS FOR HER BREATHING  . Dizziness     sometimes  . Shortness of breath     USES OXYGEN 2 L / MIN NASAL CANNUA   24 HRS A DAY; LIVES IN [name]--CAN AMBULATE SHORT DISTANCE BUT ACTIVITIES VERY LIMITED BY SOB  . Anginal pain     "with fluttering, seen MD about it"  . History of shingles   . Emphysema   . GERD (gastroesophageal reflux disease)     sometimes  . Laryngitis     10/2013, hx of  . Pneumonia     hx of  . Bipolar 1 disorder   . Cough 06/29/14    pt states recent cough / cold - feeling better - cough now non-productive.  .  Frequent urination    Past Surgical History  Procedure Laterality Date  . Bladder surgery    . Cystoscopy w/ retrogrades Bilateral 06/19/2013    Procedure: CYSTOSCOPY WITH BIOSPY AND FULGERATION, BILATERAL RETROGRADE PYELOGRAM;  Surgeon: Malka So, MD;  Location: [facility];  Service: Urology;  Laterality: Bilateral;  . Breast surgery Left     FOR TUMOR - BENIGN  . Cataract extraction Bilateral 3 years ago  . Cystoscopy w/ retrogrades Bilateral 12/25/2013    Procedure: CYSTOSCOPY WITH BILATERAL RETROGRADE WITH BIOPSY WITH FULGERATION;  Surgeon: Irine Seal, MD;  Location: [facility];  Service: Urology;  Laterality: Bilateral;  . Transurethral resection of bladder tumor Bilateral 12/25/2013    Procedure: TRANSURETHRAL RESECTION OF BLADDER TUMOR (TURBT);  Surgeon: Irine Seal, MD;  Location: [facility];  Service: Urology;  Laterality: Bilateral;  . Cystoscopy with retrograde pyelogram, ureteroscopy and stent placement Bilateral 07/02/2014    Procedure: CYSTOSCOPY WITH BILATERAL RETROGRADE PYELOGRAM;  Surgeon: Malka So, MD;  Location: [facility];  Service: Urology;  Laterality: Bilateral;  . Transurethral resection of bladder tumor N/A 07/02/2014    Procedure: TRANSURETHRAL RESECTION OF BLADDER TUMOR (TURBT);  Surgeon: Malka So, MD;  Location: [facility];  Service:  Urology;  Laterality: N/A;   No family history on file. History  Substance Use Topics  . Smoking status: Former Smoker -- 2.00 packs/day for 40 years    Types: Cigarettes    Quit date: 11/13/2000  . Smokeless tobacco: Never Used     Comment: QUIT SMOKING YRS AGO  . Alcohol Use: No   OB History    No data available     Review of Systems  Constitutional: Positive for fever. Negative for appetite change and fatigue.  HENT: Positive for congestion. Negative for ear discharge and sinus pressure.   Eyes: Negative for discharge.  Respiratory: Positive for cough.        Dyspnea  Cardiovascular: Negative for chest pain.  Gastrointestinal:  Negative for abdominal pain and diarrhea.  Genitourinary: Negative for frequency and hematuria.  Musculoskeletal: Negative for back pain.  Skin: Negative for rash.  Neurological: Negative for seizures and headaches.  Psychiatric/Behavioral: Negative for hallucinations.  All other systems reviewed and are negative.     Allergies  Review of patient's allergies indicates no known allergies.  Home Medications   Prior to Admission medications   Medication Sig Start Date End Date Taking? Authorizing Provider  acetaminophen (TYLENOL) 325 MG tablet Take 325 mg by mouth 4 (four) times daily as needed for mild pain or moderate pain.    Yes Historical Provider, MD  albuterol (PROVENTIL) (2.5 MG/3ML) 0.083% nebulizer solution Take 2.5 mg by nebulization 3 (three) times daily.   Yes Historical Provider, MD  alendronate (FOSAMAX) 70 MG tablet Take 70 mg by mouth every Friday. Take with a full glass of water on an empty stomach.   Yes Historical Provider, MD  ALPRAZolam Duanne Moron) 1 MG tablet Take 1 mg by mouth at bedtime.   Yes Historical Provider, MD  aspirin EC 81 MG tablet Take 81 mg by mouth daily.   Yes Historical Provider, MD  budesonide-formoterol (SYMBICORT) 160-4.5 MCG/ACT inhaler Inhale 2 puffs into the lungs 2 (two) times daily.   Yes Historical Provider, MD  calcium-vitamin D (OSCAL WITH D) 500-200 MG-UNIT per tablet Take 1 tablet by mouth 2 (two) times daily.   Yes Historical Provider, MD  cetirizine (ZYRTEC) 10 MG tablet Take 10 mg by mouth at bedtime.   Yes Historical Provider, MD  dextromethorphan-guaiFENesin (ROBITUSSIN-DM) 10-100 MG/5ML liquid Take 10 mLs by mouth every 4 (four) hours as needed for cough.   Yes Historical Provider, MD  diltiazem (TIAZAC) 240 MG 24 hr capsule Take 240 mg by mouth every morning.    Yes Historical Provider, MD  ferrous sulfate 325 (65 FE) MG tablet Take 325 mg by mouth daily.   Yes Historical Provider, MD  FLUoxetine (PROZAC) 20 MG tablet Take 20 mg by  mouth every morning.    Yes Historical Provider, MD  fluticasone (FLONASE) 50 MCG/ACT nasal spray Place 2 sprays into the nose daily.   Yes Historical Provider, MD  furosemide (LASIX) 40 MG tablet Take 40 mg by mouth every morning.    Yes Historical Provider, MD  Multiple Vitamin (MULTIVITAMIN WITH MINERALS) TABS tablet Take 1 tablet by mouth daily.   Yes Historical Provider, MD  risperiDONE (RISPERDAL) 0.5 MG tablet Take 0.5 mg by mouth at bedtime.   Yes Historical Provider, MD  tiotropium (SPIRIVA) 18 MCG inhalation capsule Place 18 mcg into inhaler and inhale every morning.   Yes Historical Provider, MD   BP 96/66 mmHg  Pulse 109  Temp(Src) 101.5 F (38.6 C) (Rectal)  Resp 18  Wt  170 lb (77.111 kg)  SpO2 92% Physical Exam  Constitutional: She is oriented to person, place, and time.  Elderly and frail  HENT:  Head: Normocephalic.  Dry mucous membranes  Eyes: Conjunctivae and EOM are normal. No scleral icterus.  Neck: Neck supple. No thyromegaly present.  Cardiovascular: Normal rate and regular rhythm.  Exam reveals no gallop and no friction rub.   No murmur heard. Pulmonary/Chest: No stridor. She has no wheezes. She has rales (bilateral). She exhibits no tenderness.  Abdominal: She exhibits no distension. There is no tenderness. There is no rebound.  Musculoskeletal: Normal range of motion. She exhibits edema (1+ ).  Lymphadenopathy:    She has no cervical adenopathy.  Neurological: She is oriented to person, place, and time. She exhibits normal muscle tone. Coordination normal.  Skin: No rash noted. No erythema.  Psychiatric: She has a normal mood and affect. Her behavior is normal.  Nursing note and vitals reviewed.   ED Course  Procedures (including critical care time)  DIAGNOSTIC STUDIES: Oxygen Saturation is 92% on room air, adequate by my interpretation.    COORDINATION OF CARE: 2:20 PM Discussed treatment plan with patient at beside, the patient agrees with the plan  and has no further questions at this time.   Labs Review Labs Reviewed  URINE CULTURE  BASIC METABOLIC PANEL  CBC WITH DIFFERENTIAL  URINALYSIS, ROUTINE W REFLEX MICROSCOPIC  LACTIC ACID, PLASMA  HEPATIC FUNCTION PANEL    Imaging Review No results found.   EKG Interpretation   Date/Time:  Friday November 06 2014 14:01:02 EST Ventricular Rate:  106 PR Interval:  127 QRS Duration: 92 QT Interval:  316 QTC Calculation: 420 R Axis:   84 Text Interpretation:  Sinus tachycardia Borderline right axis deviation  Low voltage, precordial leads Minimal ST depression, diffuse leads  Confirmed by Analee Montee  MD, Jonte Shiller [phone]) on 11/06/2014 2:23:18 PM     CRITICAL CARE Performed by: Jamilya Sarrazin L Total critical care time: 45 Critical care time was exclusive of separately billable procedures and treating other patients. Critical care was necessary to treat or prevent imminent or life-threatening deterioration. Critical care was time spent personally by me on the following activities: development of treatment plan with patient and/or surrogate as well as nursing, discussions with consultants, evaluation of patient's response to treatment, examination of patient, obtaining history from patient or surrogate, ordering and performing treatments and interventions, ordering and review of laboratory studies, ordering and review of radiographic studies, pulse oximetry and re-evaluation of patient's condition.  MDM   Final diagnoses:  SOB (shortness of breath)    The chart was scribed for me under my direct supervision.  I personally performed the history, physical, and medical decision making and all procedures in the evaluation of this patient.Maudry Diego, MD 11/06/14 [phone]

## 2014-11-06 NOTE — H&P (Signed)
History and Physical  April [facility] MHD:[id number] DOB: [birth year] DOA: 11/06/2014  Referring physician: Dr Roderic Palau, ED physician PCP: Alonza Bogus, MD   Chief Complaint: Fever  HPI: April [facility] is a 73 y.o. female  Who is a resident of [facility] with a history of COPD, bladder cancer, anxiety, osteoporosis, GERD who was brought to the emergency department due to new-onset of lethargy and fever, which started earlier today. Her max temperature before arrival was 101.8. She did have some ibuprofen, which helped with the fever. There are associated symptoms of cough and congestion, which started yesterday. She has some mild difficulty breathing, which is relieved with oxygen. No provoking factors.   Review of Systems:   Pt denies any chills, nausea, vomiting, abdominal pain, chest pain, palpitations, diarrhea, constipation, dysuria, rash, headache, vision changes.  Review of systems are otherwise negative  Past Medical History  Diagnosis Date  . COPD (chronic obstructive pulmonary disease)   . Osteoporosis   . Anxiety   . Weakness   . Tachycardia   . Cancer     bladder  . Depression   . Arthritis     KNEES AND HANDS  . Coarse tremors     TREMORS BOTH HANDS - PT STATES SIDE EFFECT OF HER MEDICATIONS FOR HER BREATHING  . Dizziness     sometimes  . Shortness of breath     USES OXYGEN 2 L / MIN NASAL CANNUA   24 HRS A DAY; LIVES IN [facility]ED [facility]--CAN AMBULATE SHORT DISTANCE BUT ACTIVITIES VERY LIMITED BY SOB  . Anginal pain     "with fluttering, seen MD about it"  . History of shingles   . Emphysema   . GERD (gastroesophageal reflux disease)     sometimes  . Laryngitis     10/2013, hx of  . Pneumonia     hx of  . Bipolar 1 disorder   . Cough 06/29/14    pt states recent cough / cold - feeling better - cough now non-productive.  . Frequent urination    Past Surgical History  Procedure Laterality Date  .  Bladder surgery    . Cystoscopy w/ retrogrades Bilateral 06/19/2013    Procedure: CYSTOSCOPY WITH BIOSPY AND FULGERATION, BILATERAL RETROGRADE PYELOGRAM;  Surgeon: Malka So, MD;  Location: [facility];  Service: Urology;  Laterality: Bilateral;  . Breast surgery Left     FOR TUMOR - BENIGN  . Cataract extraction Bilateral 3 years ago  . Cystoscopy w/ retrogrades Bilateral 12/25/2013    Procedure: CYSTOSCOPY WITH BILATERAL RETROGRADE WITH BIOPSY WITH FULGERATION;  Surgeon: Irine Seal, MD;  Location: [facility];  Service: Urology;  Laterality: Bilateral;  . Transurethral resection of bladder tumor Bilateral 12/25/2013    Procedure: TRANSURETHRAL RESECTION OF BLADDER TUMOR (TURBT);  Surgeon: Irine Seal, MD;  Location: [facility];  Service: Urology;  Laterality: Bilateral;  . Cystoscopy with retrograde pyelogram, ureteroscopy and stent placement Bilateral 07/02/2014    Procedure: CYSTOSCOPY WITH BILATERAL RETROGRADE PYELOGRAM;  Surgeon: Malka So, MD;  Location: [facility];  Service: Urology;  Laterality: Bilateral;  . Transurethral resection of bladder tumor N/A 07/02/2014    Procedure: TRANSURETHRAL RESECTION OF BLADDER TUMOR (TURBT);  Surgeon: Malka So, MD;  Location: [facility];  Service: Urology;  Laterality: N/A;   Social History:  reports that she quit smoking about 13 years ago. Her smoking use included Cigarettes. She has a 80 pack-year smoking history. She has  never used smokeless tobacco. She reports that she does not drink alcohol or use illicit drugs. Patient lives at [facility]   No Known Allergies  No family history on file.    Prior to Admission medications   Medication Sig Start Date End Date Taking? Authorizing Provider  acetaminophen (TYLENOL) 325 MG tablet Take 325 mg by mouth 4 (four) times daily as needed for mild pain or moderate pain.    Yes Historical Provider, MD  albuterol (PROVENTIL) (2.5 MG/3ML) 0.083% nebulizer solution Take 2.5 mg by nebulization 3 (three)  times daily.   Yes Historical Provider, MD  alendronate (FOSAMAX) 70 MG tablet Take 70 mg by mouth every Friday. Take with a full glass of water on an empty stomach.   Yes Historical Provider, MD  ALPRAZolam Duanne Moron) 1 MG tablet Take 1 mg by mouth at bedtime.   Yes Historical Provider, MD  aspirin EC 81 MG tablet Take 81 mg by mouth daily.   Yes Historical Provider, MD  budesonide-formoterol (SYMBICORT) 160-4.5 MCG/ACT inhaler Inhale 2 puffs into the lungs 2 (two) times daily.   Yes Historical Provider, MD  calcium-vitamin D (OSCAL WITH D) 500-200 MG-UNIT per tablet Take 1 tablet by mouth 2 (two) times daily.   Yes Historical Provider, MD  cetirizine (ZYRTEC) 10 MG tablet Take 10 mg by mouth at bedtime.   Yes Historical Provider, MD  dextromethorphan-guaiFENesin (ROBITUSSIN-DM) 10-100 MG/5ML liquid Take 10 mLs by mouth every 4 (four) hours as needed for cough.   Yes Historical Provider, MD  diltiazem (TIAZAC) 240 MG 24 hr capsule Take 240 mg by mouth every morning.    Yes Historical Provider, MD  ferrous sulfate 325 (65 FE) MG tablet Take 325 mg by mouth daily.   Yes Historical Provider, MD  FLUoxetine (PROZAC) 20 MG tablet Take 20 mg by mouth every morning.    Yes Historical Provider, MD  fluticasone (FLONASE) 50 MCG/ACT nasal spray Place 2 sprays into the nose daily.   Yes Historical Provider, MD  furosemide (LASIX) 40 MG tablet Take 40 mg by mouth every morning.    Yes Historical Provider, MD  Multiple Vitamin (MULTIVITAMIN WITH MINERALS) TABS tablet Take 1 tablet by mouth daily.   Yes Historical Provider, MD  risperiDONE (RISPERDAL) 0.5 MG tablet Take 0.5 mg by mouth at bedtime.   Yes Historical Provider, MD  tiotropium (SPIRIVA) 18 MCG inhalation capsule Place 18 mcg into inhaler and inhale every morning.   Yes Historical Provider, MD    Physical Exam: BP 102/57 mmHg  Pulse 99  Temp(Src) 101.5 F (38.6 C) (Rectal)  Resp 37  Wt 77.111 kg (170 lb)  SpO2 100%  General: Elderly Caucasian  female. Awake and alert and oriented x3. No acute cardiopulmonary distress.  Eyes: Pupils equal, round, reactive to light. Extraocular muscles are intact. Sclerae anicteric and noninjected.  ENT: Moist mucosal membranes. No mucosal lesions.   Neck: Neck supple without lymphadenopathy. No carotid bruits. No masses palpated.  Cardiovascular: Regular rate with normal S1-S2 sounds. No murmurs, rubs, gallops auscultated. No JVD.  Respiratory: Rales in the bases bilaterally with no wheezes. There are coarse breath sounds throughout. Abdomen: Soft, nontender, nondistended. Active bowel sounds. No masses or hepatosplenomegaly  Skin: Dry, warm to touch. 2+ dorsalis pedis and radial pulses. Skin and sacral area is slightly erythematous although no skin breakdown Musculoskeletal: No calf or leg pain. All major joints not erythematous nontender.  Psychiatric: Intact judgment and insight.  Neurologic: No focal neurological deficits.  Cranial nerves II through XII are grossly intact.           Labs on Admission:  Basic Metabolic Panel:  Recent Labs Lab 11/06/14 1410  NA 138  K 3.7  CL 99  CO2 31  GLUCOSE 138*  BUN 22  CREATININE 1.51*  CALCIUM 8.8   Liver Function Tests:  Recent Labs Lab 11/06/14 1410  AST 28  ALT 21  ALKPHOS 66  BILITOT 0.4  PROT 6.2  ALBUMIN 3.5   No results for input(s): LIPASE, AMYLASE in the last 168 hours. No results for input(s): AMMONIA in the last 168 hours. CBC:  Recent Labs Lab 11/06/14 1410  WBC 43.3*  NEUTROABS 39.4*  HGB 9.5*  HCT 30.2*  MCV 97.1  PLT 296   Cardiac Enzymes: No results for input(s): CKTOTAL, CKMB, CKMBINDEX, TROPONINI in the last 168 hours.  BNP (last 3 results) No results for input(s): PROBNP in the last 8760 hours. CBG: No results for input(s): GLUCAP in the last 168 hours.  Radiological Exams on Admission: Dg Chest Portable 1 View  11/06/2014   CLINICAL DATA:  Shortness of breath, wheezing, fever and congestion for  1 day.  EXAM: PORTABLE CHEST - 1 VIEW  COMPARISON:  CT chest 12/29/2013 and single view of the chest 12/29/2013.  FINDINGS: The lungs are emphysematous but clear. Heart size is normal. No pneumothorax or pleural effusion.  IMPRESSION: Emphysema without acute disease.   Electronically Signed   By: Inge Rise M.D.   On: 11/06/2014 14:39    EKG: Independently reviewed. Sinus Tachycardia with rate of 106.  Normal intervals.  No acute ST changes  Assessment/Plan Present on Admission:  . Sepsis . Leukocytosis . Fever  #1 sepsis Patient fluid rehydrated in the ER with 2 L of normal saline. Will give patient another 500 mL bolus and continue the patient on IV hydration at 100 miles per hour. We'll hold her diuretic. We'll also continue the patient on Zosyn and vancomycin, both of which were started in the emergency department.  #2 leukocytosis #3 fever No obvious source of the patient's infectious etiology. The urinalysis points to a possible cystitis.  The patient may also have a pneumonia that is not yet shown on the x-ray.  Will continue on vancomycin and Zosyn. Urine cultures and blood cultures are pending. We'll continue the patient's albuterol inhalers, as this is helpful in the patient's oxygen saturation.  #4 acute renal insufficiency Fluid resuscitation should improve the patient's creatinine and renal function.  #5 GERD Patient started on Protonix for gastric protection   DVT prophylaxis: Lovenox  Consultants: None  Code Status: DO NOT RESUSCITATE  Family Communication: None   Disposition Plan: Back to nursing home following resolution  Time spent: 70 minutes  Loma Boston, DO [facility] Pager [phone]

## 2014-11-06 NOTE — Progress Notes (Signed)
ANTIBIOTIC CONSULT NOTE  Pharmacy Consult for Vancomycin & Zosyn Indication: sepsis  No Known Allergies  Patient Measurements: Height: 5\' 6"  (167.6 cm) Weight: 181 lb 14.1 oz (82.5 kg) IBW/kg (Calculated) : 59.3  Vital Signs: Temp: 98.4 F (36.9 C) (12/25 1706) Temp Source: Oral (12/25 1706) BP: 107/39 mmHg (12/25 1600) Pulse Rate: 90 (12/25 1706) Intake/Output from previous day:   Intake/Output from this shift: Total I/O In: 300 [IV Piggyback:300] Out: -   Labs:  Recent Labs  11/06/14 1410  WBC 43.3*  HGB 9.5*  PLT 296  CREATININE 1.51*   Estimated Creatinine Clearance: 35.9 mL/min (by C-G formula based on Cr of 1.51). No results for input(s): VANCOTROUGH, VANCOPEAK, VANCORANDOM, GENTTROUGH, GENTPEAK, GENTRANDOM, TOBRATROUGH, TOBRAPEAK, TOBRARND, AMIKACINPEAK, AMIKACINTROU, AMIKACIN in the last 72 hours.   Microbiology: No results found for this or any previous visit (from the past 720 hour(s)).  Anti-infectives    Start     Dose/Rate Route Frequency Ordered Stop   11/06/14 1445  piperacillin-tazobactam (ZOSYN) IVPB 2.25 g     2.25 g66.7 mL/hr over 30 Minutes Intravenous  Once 11/06/14 1440 11/06/14 1550   11/06/14 1445  vancomycin (VANCOCIN) IVPB 1000 mg/200 mL premix     1,000 mg200 mL/hr over 60 Minutes Intravenous  Once 11/06/14 1440 11/06/14 1730      Assessment: 73 yo F who presented from nursing home with fever and congestion.  Leukocytosis noted, but lactic acid level normal.   Source of infection unknown - possible cystitis per UA or respiratory illness not identified by CXR.  Scr elevated above patient's baseline.  Estimated CrCl~ 35 ml/min.   Initial antibiotic doses given in ED.   Vancomycin 12/25>> Zosyn 12/25>>  Goal of Therapy:  Vancomycin trough level 15-20 mcg/ml  Plan:  Zosyn 3.375gm IV Q8h to be infused over 4hrs Vancomycin 1250mg  IV q24h Check Vancomycin trough at steady state Monitor renal function and cx data   Biagio Borg 11/06/2014,5:38 PM

## 2014-11-07 LAB — BASIC METABOLIC PANEL
ANION GAP: 7 (ref 5–15)
BUN: 30 mg/dL — ABNORMAL HIGH (ref 6–23)
CALCIUM: 7.7 mg/dL — AB (ref 8.4–10.5)
CO2: 30 mmol/L (ref 19–32)
CREATININE: 1.54 mg/dL — AB (ref 0.50–1.10)
Chloride: 102 mEq/L (ref 96–112)
GFR, EST AFRICAN AMERICAN: 37 mL/min — AB (ref >90)
GFR, EST NON AFRICAN AMERICAN: 32 mL/min — AB (ref >90)
Glucose, Bld: 106 mg/dL — ABNORMAL HIGH (ref 70–99)
Potassium: 4.2 mmol/L (ref 3.5–5.1)
Sodium: 139 mmol/L (ref 135–145)

## 2014-11-07 LAB — CBC
HCT: 27.7 % — ABNORMAL LOW (ref 36.0–46.0)
Hemoglobin: 8.6 g/dL — ABNORMAL LOW (ref 12.0–15.0)
MCH: 30.8 pg (ref 26.0–34.0)
MCHC: 31 g/dL (ref 30.0–36.0)
MCV: 99.3 fL (ref 78.0–100.0)
PLATELETS: 247 10*3/uL (ref 150–400)
RBC: 2.79 MIL/uL — ABNORMAL LOW (ref 3.87–5.11)
RDW: 16 % — AB (ref 11.5–15.5)
WBC: 45.4 10*3/uL — AB (ref 4.0–10.5)

## 2014-11-07 LAB — MAGNESIUM: Magnesium: 1.5 mg/dL (ref 1.5–2.5)

## 2014-11-07 LAB — URINE CULTURE
COLONY COUNT: NO GROWTH
CULTURE: NO GROWTH

## 2014-11-07 LAB — PHOSPHORUS: Phosphorus: 3.7 mg/dL (ref 2.3–4.6)

## 2014-11-07 MED ORDER — DILTIAZEM HCL ER COATED BEADS 240 MG PO CP24
240.0000 mg | ORAL_CAPSULE | Freq: Every day | ORAL | Status: DC
  Administered 2014-11-07 – 2014-11-14 (×8): 240 mg via ORAL
  Filled 2014-11-07 (×8): qty 1

## 2014-11-07 MED ORDER — FLUOXETINE HCL 20 MG PO CAPS
20.0000 mg | ORAL_CAPSULE | Freq: Every day | ORAL | Status: DC
  Administered 2014-11-07 – 2014-11-14 (×8): 20 mg via ORAL
  Filled 2014-11-07 (×8): qty 1

## 2014-11-07 MED ORDER — GUAIFENESIN ER 600 MG PO TB12
1200.0000 mg | ORAL_TABLET | Freq: Two times a day (BID) | ORAL | Status: DC
  Administered 2014-11-07 – 2014-11-14 (×15): 1200 mg via ORAL
  Filled 2014-11-07 (×14): qty 2

## 2014-11-07 NOTE — Progress Notes (Signed)
Most of her wheezes are upper airway . Some on Right side.

## 2014-11-07 NOTE — Progress Notes (Signed)
Subjective: She was admitted yesterday with sepsis and respiratory failure probably on the basis of pneumonia. She has improved. She does not have pneumonia on chest x-ray but she was febrile has a very high white blood cell count and was septic.  Objective: Vital signs in last 24 hours: Temp:  [97.3 F (36.3 C)-101.5 F (38.6 C)] 97.8 F (36.6 C) (12/26 0733) Pulse Rate:  [82-109] 99 (12/26 0800) Resp:  [17-37] 24 (12/26 0800) BP: (86-121)/(39-66) 103/48 mmHg (12/26 0800) SpO2:  [87 %-100 %] 100 % (12/26 0800) FiO2 (%):  [4 %] 4 % (12/25 1706) Weight:  [77.111 kg (170 lb)-82.5 kg (181 lb 14.1 oz)] 82.5 kg (181 lb 14.1 oz) (12/26 0458) Weight change:  Last BM Date: 11/05/14  Intake/Output from previous day: 12/25 0701 - 12/26 0700 In: 3420.8 [P.O.:540; I.V.:1480.8; IV Piggyback:1400] Out: 500 [Urine:500]  PHYSICAL EXAM General appearance: alert, cooperative and no distress Resp: rhonchi bilaterally Cardio: regular rate and rhythm, S1, S2 normal, no murmur, click, rub or gallop GI: soft, non-tender; bowel sounds normal; no masses,  no organomegaly Extremities: extremities normal, atraumatic, no cyanosis or edema  Lab Results:  Results for orders placed or performed during the hospital encounter of 11/06/14 (from the past 48 hour(s))  Basic metabolic panel     Status: Abnormal   Collection Time: 11/06/14  2:10 PM  Result Value Ref Range   Sodium 138 135 - 145 mmol/L    Comment: Please note change in reference range.   Potassium 3.7 3.5 - 5.1 mmol/L    Comment: Please note change in reference range.   Chloride 99 96 - 112 mEq/L   CO2 31 19 - 32 mmol/L   Glucose, Bld 138 (H) 70 - 99 mg/dL   BUN 22 6 - 23 mg/dL   Creatinine, Ser 1.51 (H) 0.50 - 1.10 mg/dL   Calcium 8.8 8.4 - 10.5 mg/dL   GFR calc non Af Amer 33 (L) >90 mL/min   GFR calc Af Amer 38 (L) >90 mL/min    Comment: (NOTE) The eGFR has been calculated using the CKD EPI equation. This calculation has not been  validated in all clinical situations. eGFR's persistently <90 mL/min signify possible Chronic Kidney Disease.    Anion gap 8 5 - 15  CBC with Differential     Status: Abnormal   Collection Time: 11/06/14  2:10 PM  Result Value Ref Range   WBC 43.3 (H) 4.0 - 10.5 K/uL   RBC 3.11 (L) 3.87 - 5.11 MIL/uL   Hemoglobin 9.5 (L) 12.0 - 15.0 g/dL   HCT 30.2 (L) 36.0 - 46.0 %   MCV 97.1 78.0 - 100.0 fL   MCH 30.5 26.0 - 34.0 pg   MCHC 31.5 30.0 - 36.0 g/dL   RDW 15.5 11.5 - 15.5 %   Platelets 296 150 - 400 K/uL   Neutrophils Relative % 87 (H) 43 - 77 %   Lymphocytes Relative 2 (L) 12 - 46 %   Monocytes Relative 7 3 - 12 %   Eosinophils Relative 0 0 - 5 %   Basophils Relative 0 0 - 1 %   Band Neutrophils 4 0 - 10 %   Metamyelocytes Relative 0 %   Myelocytes 0 %   Promyelocytes Absolute 0 %   Blasts 0 %   nRBC 0 0 /100 WBC   Neutro Abs 39.4 (H) 1.7 - 7.7 K/uL   Lymphs Abs 0.9 0.7 - 4.0 K/uL   Monocytes Absolute 3.0 (H)  0.1 - 1.0 K/uL   Eosinophils Absolute 0.0 0.0 - 0.7 K/uL   Basophils Absolute 0.0 0.0 - 0.1 K/uL  Urinalysis, Routine w reflex microscopic     Status: Abnormal   Collection Time: 11/06/14  2:10 PM  Result Value Ref Range   Color, Urine YELLOW YELLOW   APPearance HAZY (A) CLEAR   Specific Gravity, Urine 1.015 1.005 - 1.030   pH 8.0 5.0 - 8.0   Glucose, UA NEGATIVE NEGATIVE mg/dL   Hgb urine dipstick NEGATIVE NEGATIVE   Bilirubin Urine NEGATIVE NEGATIVE   Ketones, ur NEGATIVE NEGATIVE mg/dL   Protein, ur 30 (A) NEGATIVE mg/dL   Urobilinogen, UA 0.2 0.0 - 1.0 mg/dL   Nitrite NEGATIVE NEGATIVE   Leukocytes, UA SMALL (A) NEGATIVE  Hepatic function panel     Status: None   Collection Time: 11/06/14  2:10 PM  Result Value Ref Range   Total Protein 6.2 6.0 - 8.3 g/dL   Albumin 3.5 3.5 - 5.2 g/dL   AST 28 0 - 37 U/L   ALT 21 0 - 35 U/L   Alkaline Phosphatase 66 39 - 117 U/L   Total Bilirubin 0.4 0.3 - 1.2 mg/dL   Bilirubin, Direct 0.1 0.0 - 0.3 mg/dL   Indirect  Bilirubin 0.3 0.3 - 0.9 mg/dL  Urine microscopic-add on     Status: Abnormal   Collection Time: 11/06/14  2:10 PM  Result Value Ref Range   Squamous Epithelial / LPF RARE RARE   WBC, UA 21-50 <3 WBC/hpf   Bacteria, UA FEW (A) RARE  Lactic acid, plasma     Status: None   Collection Time: 11/06/14  2:30 PM  Result Value Ref Range   Lactic Acid, Venous 1.5 0.5 - 2.2 mmol/L  Brain natriuretic peptide     Status: Abnormal   Collection Time: 11/06/14  2:30 PM  Result Value Ref Range   B Natriuretic Peptide 269.0 (H) 0.0 - 100.0 pg/mL    Comment: Please note change in reference range.  Blood culture (routine x 2)     Status: None (Preliminary result)   Collection Time: 11/06/14  2:30 PM  Result Value Ref Range   Specimen Description BLOOD LEFT ARM    Special Requests BOTTLES DRAWN AEROBIC AND ANAEROBIC 6CC    Culture NO GROWTH 1 DAY    Report Status PENDING   Blood culture (routine x 2)     Status: None (Preliminary result)   Collection Time: 11/06/14  2:37 PM  Result Value Ref Range   Specimen Description BLOOD LEFT HAND    Special Requests BOTTLES DRAWN AEROBIC AND ANAEROBIC 6CC    Culture NO GROWTH 1 DAY    Report Status PENDING   MRSA PCR Screening     Status: None   Collection Time: 11/06/14  5:00 PM  Result Value Ref Range   MRSA by PCR NEGATIVE NEGATIVE    Comment:        The GeneXpert MRSA Assay (FDA approved for NASAL specimens only), is one component of a comprehensive MRSA colonization surveillance program. It is not intended to diagnose MRSA infection nor to guide or monitor treatment for MRSA infections.   Influenza panel by PCR (type A & B, H1N1)     Status: None   Collection Time: 11/06/14  5:10 PM  Result Value Ref Range   Influenza A By PCR NEGATIVE NEGATIVE   Influenza B By PCR NEGATIVE NEGATIVE   H1N1 flu by pcr NOT DETECTED NOT DETECTED  Comment:        The Xpert Flu assay (FDA approved for nasal aspirates or washes and nasopharyngeal swab  specimens), is intended as an aid in the diagnosis of influenza and should not be used as a sole basis for treatment.   CBC     Status: Abnormal   Collection Time: 11/07/14  5:46 AM  Result Value Ref Range   WBC 45.4 (H) 4.0 - 10.5 K/uL   RBC 2.79 (L) 3.87 - 5.11 MIL/uL   Hemoglobin 8.6 (L) 12.0 - 15.0 g/dL   HCT 27.7 (L) 36.0 - 46.0 %   MCV 99.3 78.0 - 100.0 fL   MCH 30.8 26.0 - 34.0 pg   MCHC 31.0 30.0 - 36.0 g/dL   RDW 16.0 (H) 11.5 - 15.5 %   Platelets 247 150 - 400 K/uL  Basic metabolic panel     Status: Abnormal   Collection Time: 11/07/14  5:46 AM  Result Value Ref Range   Sodium 139 135 - 145 mmol/L    Comment: Please note change in reference range.   Potassium 4.2 3.5 - 5.1 mmol/L    Comment: Please note change in reference range.   Chloride 102 96 - 112 mEq/L   CO2 30 19 - 32 mmol/L   Glucose, Bld 106 (H) 70 - 99 mg/dL   BUN 30 (H) 6 - 23 mg/dL   Creatinine, Ser 1.54 (H) 0.50 - 1.10 mg/dL   Calcium 7.7 (L) 8.4 - 10.5 mg/dL   GFR calc non Af Amer 32 (L) >90 mL/min   GFR calc Af Amer 37 (L) >90 mL/min    Comment: (NOTE) The eGFR has been calculated using the CKD EPI equation. This calculation has not been validated in all clinical situations. eGFR's persistently <90 mL/min signify possible Chronic Kidney Disease.    Anion gap 7 5 - 15  Magnesium     Status: None   Collection Time: 11/07/14  5:46 AM  Result Value Ref Range   Magnesium 1.5 1.5 - 2.5 mg/dL  Phosphorus     Status: None   Collection Time: 11/07/14  5:46 AM  Result Value Ref Range   Phosphorus 3.7 2.3 - 4.6 mg/dL    ABGS No results for input(s): PHART, PO2ART, TCO2, HCO3 in the last 72 hours.  Invalid input(s): PCO2 CULTURES Recent Results (from the past 240 hour(s))  Blood culture (routine x 2)     Status: None (Preliminary result)   Collection Time: 11/06/14  2:30 PM  Result Value Ref Range Status   Specimen Description BLOOD LEFT ARM  Final   Special Requests BOTTLES DRAWN AEROBIC AND  ANAEROBIC 6CC  Final   Culture NO GROWTH 1 DAY  Final   Report Status PENDING  Incomplete  Blood culture (routine x 2)     Status: None (Preliminary result)   Collection Time: 11/06/14  2:37 PM  Result Value Ref Range Status   Specimen Description BLOOD LEFT HAND  Final   Special Requests BOTTLES DRAWN AEROBIC AND ANAEROBIC 6CC  Final   Culture NO GROWTH 1 DAY  Final   Report Status PENDING  Incomplete  MRSA PCR Screening     Status: None   Collection Time: 11/06/14  5:00 PM  Result Value Ref Range Status   MRSA by PCR NEGATIVE NEGATIVE Final    Comment:        The GeneXpert MRSA Assay (FDA approved for NASAL specimens only), is one component of a comprehensive MRSA colonization  surveillance program. It is not intended to diagnose MRSA infection nor to guide or monitor treatment for MRSA infections.    Studies/Results: Dg Chest Portable 1 View  11/06/2014   CLINICAL DATA:  Shortness of breath, wheezing, fever and congestion for 1 day.  EXAM: PORTABLE CHEST - 1 VIEW  COMPARISON:  CT chest 12/29/2013 and single view of the chest 12/29/2013.  FINDINGS: The lungs are emphysematous but clear. Heart size is normal. No pneumothorax or pleural effusion.  IMPRESSION: Emphysema without acute disease.   Electronically Signed   By: Inge Rise M.D.   On: 11/06/2014 14:39    Medications:  Prior to Admission:  Prescriptions prior to admission  Medication Sig Dispense Refill Last Dose  . acetaminophen (TYLENOL) 325 MG tablet Take 325 mg by mouth 4 (four) times daily as needed for mild pain or moderate pain.    UNKNOWN  . albuterol (PROVENTIL) (2.5 MG/3ML) 0.083% nebulizer solution Take 2.5 mg by nebulization 3 (three) times daily.   11/06/2014 at Unknown time  . alendronate (FOSAMAX) 70 MG tablet Take 70 mg by mouth every Friday. Take with a full glass of water on an empty stomach.   10/30/2014  . ALPRAZolam (XANAX) 1 MG tablet Take 1 mg by mouth at bedtime.   11/05/2014 at Unknown time   . aspirin EC 81 MG tablet Take 81 mg by mouth daily.   11/06/2014 at Unknown time  . budesonide-formoterol (SYMBICORT) 160-4.5 MCG/ACT inhaler Inhale 2 puffs into the lungs 2 (two) times daily.   11/06/2014 at Unknown time  . calcium-vitamin D (OSCAL WITH D) 500-200 MG-UNIT per tablet Take 1 tablet by mouth 2 (two) times daily.   11/06/2014 at Unknown time  . cetirizine (ZYRTEC) 10 MG tablet Take 10 mg by mouth at bedtime.   11/05/2014 at Unknown time  . dextromethorphan-guaiFENesin (ROBITUSSIN-DM) 10-100 MG/5ML liquid Take 10 mLs by mouth every 4 (four) hours as needed for cough.   UNKNOWN  . diltiazem (TIAZAC) 240 MG 24 hr capsule Take 240 mg by mouth every morning.    11/06/2014 at Unknown time  . ferrous sulfate 325 (65 FE) MG tablet Take 325 mg by mouth daily.   11/06/2014 at Unknown time  . FLUoxetine (PROZAC) 20 MG tablet Take 20 mg by mouth every morning.    11/06/2014 at Unknown time  . fluticasone (FLONASE) 50 MCG/ACT nasal spray Place 2 sprays into the nose daily.   11/06/2014 at Unknown time  . furosemide (LASIX) 40 MG tablet Take 40 mg by mouth every morning.    11/06/2014 at Unknown time  . Multiple Vitamin (MULTIVITAMIN WITH MINERALS) TABS tablet Take 1 tablet by mouth daily.   11/06/2014 at Unknown time  . risperiDONE (RISPERDAL) 0.5 MG tablet Take 0.5 mg by mouth at bedtime.   11/05/2014 at Unknown time  . tiotropium (SPIRIVA) 18 MCG inhalation capsule Place 18 mcg into inhaler and inhale every morning.   11/06/2014 at Unknown time   Scheduled: . sodium chloride   Intravenous Once  . ALPRAZolam  1 mg Oral QHS  . antiseptic oral rinse  7 mL Mouth Rinse BID  . aspirin EC  81 mg Oral Daily  . budesonide-formoterol  2 puff Inhalation BID  . diltiazem  240 mg Oral Daily  . enoxaparin (LOVENOX) injection  40 mg Subcutaneous Q24H  . FLUoxetine  20 mg Oral Daily  . fluticasone  2 spray Each Nare Daily  . guaiFENesin  1,200 mg Oral BID  . ipratropium-albuterol  3 mL Nebulization  Q6H  . loratadine  10 mg Oral Daily  . pantoprazole  40 mg Oral Daily  . piperacillin-tazobactam (ZOSYN)  IV  3.375 g Intravenous 3 times per day  . risperiDONE  0.5 mg Oral QHS  . sodium chloride  500 mL Intravenous Once  . vancomycin  1,250 mg Intravenous Q24H   Continuous: . 0.9 % NaCl with KCl 20 mEq / L 100 mL/hr at 11/07/14 7092   HVF:MBBUYZ chloride, acetaminophen, albuterol, ondansetron (ZOFRAN) IV  Assesment: She has respiratory failure on the basis of what I believe is healthcare associated pneumonia although it did not show on chest x-ray. She has severe COPD at baseline. She has some chronic renal failure and is acutely worse. She has been septic which is improved. Her white blood count is still very high. Active Problems:   Sepsis   Acute renal insufficiency   Leukocytosis   Fever    Plan: No change in treatments. She will remain in the intensive care unit.    LOS: 1 day   Shaul Trautman L 11/07/2014, 9:43 AM

## 2014-11-08 ENCOUNTER — Inpatient Hospital Stay (HOSPITAL_COMMUNITY): Payer: Medicare Other

## 2014-11-08 LAB — URINALYSIS, ROUTINE W REFLEX MICROSCOPIC
BILIRUBIN URINE: NEGATIVE
Glucose, UA: NEGATIVE mg/dL
Nitrite: NEGATIVE
PROTEIN: 30 mg/dL — AB
Specific Gravity, Urine: 1.02 (ref 1.005–1.030)
Urobilinogen, UA: 0.2 mg/dL (ref 0.0–1.0)
pH: 5.5 (ref 5.0–8.0)

## 2014-11-08 LAB — URINE MICROSCOPIC-ADD ON

## 2014-11-08 MED ORDER — METHYLPREDNISOLONE SODIUM SUCC 40 MG IJ SOLR
40.0000 mg | Freq: Four times a day (QID) | INTRAMUSCULAR | Status: DC
  Administered 2014-11-08 – 2014-11-11 (×12): 40 mg via INTRAVENOUS
  Filled 2014-11-08 (×12): qty 1

## 2014-11-08 NOTE — Progress Notes (Signed)
Wheezing upper increases with neb treatment, patient given an additional albuterol 2.5mg 

## 2014-11-08 NOTE — Progress Notes (Signed)
Subjective: She is awake and alert and still short of breath. Chest x-ray was done without significant change.  Objective: Vital signs in last 24 hours: Temp:  [97.5 F (36.4 C)-98.9 F (37.2 C)] 98.2 F (36.8 C) (12/27 0800) Pulse Rate:  [96-125] 113 (12/27 0800) Resp:  [17-37] 27 (12/27 0800) BP: (108-141)/(45-85) 136/53 mmHg (12/27 0800) SpO2:  [91 %-100 %] 99 % (12/27 0800) Weight:  [85 kg (187 lb 6.3 oz)] 85 kg (187 lb 6.3 oz) (12/27 0500) Weight change: 7.889 kg (17 lb 6.3 oz) Last BM Date: 11/05/14  Intake/Output from previous day: 12/26 0701 - 12/27 0700 In: 3734.2 [P.O.:840; I.V.:2494.2; IV Piggyback:400] Out: 1625 [Urine:1625]  PHYSICAL EXAM General appearance: alert, cooperative and moderate distress Resp: wheezes bilaterally Cardio: regular rate and rhythm, S1, S2 normal, no murmur, click, rub or gallop GI: soft, non-tender; bowel sounds normal; no masses,  no organomegaly Extremities: extremities normal, atraumatic, no cyanosis or edema  Lab Results:  Results for orders placed or performed during the hospital encounter of 11/06/14 (from the past 48 hour(s))  Basic metabolic panel     Status: Abnormal   Collection Time: 11/06/14  2:10 PM  Result Value Ref Range   Sodium 138 135 - 145 mmol/L    Comment: Please note change in reference range.   Potassium 3.7 3.5 - 5.1 mmol/L    Comment: Please note change in reference range.   Chloride 99 96 - 112 mEq/L   CO2 31 19 - 32 mmol/L   Glucose, Bld 138 (H) 70 - 99 mg/dL   BUN 22 6 - 23 mg/dL   Creatinine, Ser 1.51 (H) 0.50 - 1.10 mg/dL   Calcium 8.8 8.4 - 10.5 mg/dL   GFR calc non Af Amer 33 (L) >90 mL/min   GFR calc Af Amer 38 (L) >90 mL/min    Comment: (NOTE) The eGFR has been calculated using the CKD EPI equation. This calculation has not been validated in all clinical situations. eGFR's persistently <90 mL/min signify possible Chronic Kidney Disease.    Anion gap 8 5 - 15  CBC with Differential     Status:  Abnormal   Collection Time: 11/06/14  2:10 PM  Result Value Ref Range   WBC 43.3 (H) 4.0 - 10.5 K/uL   RBC 3.11 (L) 3.87 - 5.11 MIL/uL   Hemoglobin 9.5 (L) 12.0 - 15.0 g/dL   HCT 30.2 (L) 36.0 - 46.0 %   MCV 97.1 78.0 - 100.0 fL   MCH 30.5 26.0 - 34.0 pg   MCHC 31.5 30.0 - 36.0 g/dL   RDW 15.5 11.5 - 15.5 %   Platelets 296 150 - 400 K/uL   Neutrophils Relative % 87 (H) 43 - 77 %   Lymphocytes Relative 2 (L) 12 - 46 %   Monocytes Relative 7 3 - 12 %   Eosinophils Relative 0 0 - 5 %   Basophils Relative 0 0 - 1 %   Band Neutrophils 4 0 - 10 %   Metamyelocytes Relative 0 %   Myelocytes 0 %   Promyelocytes Absolute 0 %   Blasts 0 %   nRBC 0 0 /100 WBC   Neutro Abs 39.4 (H) 1.7 - 7.7 K/uL   Lymphs Abs 0.9 0.7 - 4.0 K/uL   Monocytes Absolute 3.0 (H) 0.1 - 1.0 K/uL   Eosinophils Absolute 0.0 0.0 - 0.7 K/uL   Basophils Absolute 0.0 0.0 - 0.1 K/uL  Urinalysis, Routine w reflex microscopic  Status: Abnormal   Collection Time: 11/06/14  2:10 PM  Result Value Ref Range   Color, Urine YELLOW YELLOW   APPearance HAZY (A) CLEAR   Specific Gravity, Urine 1.015 1.005 - 1.030   pH 8.0 5.0 - 8.0   Glucose, UA NEGATIVE NEGATIVE mg/dL   Hgb urine dipstick NEGATIVE NEGATIVE   Bilirubin Urine NEGATIVE NEGATIVE   Ketones, ur NEGATIVE NEGATIVE mg/dL   Protein, ur 30 (A) NEGATIVE mg/dL   Urobilinogen, UA 0.2 0.0 - 1.0 mg/dL   Nitrite NEGATIVE NEGATIVE   Leukocytes, UA SMALL (A) NEGATIVE  Urine culture     Status: None   Collection Time: 11/06/14  2:10 PM  Result Value Ref Range   Specimen Description URINE, CATHETERIZED    Special Requests NONE    Colony Count NO GROWTH Performed at [facility]     Culture NO GROWTH Performed at [facility]     Report Status 11/07/2014 FINAL   Hepatic function panel     Status: None   Collection Time: 11/06/14  2:10 PM  Result Value Ref Range   Total Protein 6.2 6.0 - 8.3 g/dL   Albumin 3.5 3.5 - 5.2 g/dL   AST 28 0 - 37 U/L    ALT 21 0 - 35 U/L   Alkaline Phosphatase 66 39 - 117 U/L   Total Bilirubin 0.4 0.3 - 1.2 mg/dL   Bilirubin, Direct 0.1 0.0 - 0.3 mg/dL   Indirect Bilirubin 0.3 0.3 - 0.9 mg/dL  Urine microscopic-add on     Status: Abnormal   Collection Time: 11/06/14  2:10 PM  Result Value Ref Range   Squamous Epithelial / LPF RARE RARE   WBC, UA 21-50 <3 WBC/hpf   Bacteria, UA FEW (A) RARE  Lactic acid, plasma     Status: None   Collection Time: 11/06/14  2:30 PM  Result Value Ref Range   Lactic Acid, Venous 1.5 0.5 - 2.2 mmol/L  Brain natriuretic peptide     Status: Abnormal   Collection Time: 11/06/14  2:30 PM  Result Value Ref Range   B Natriuretic Peptide 269.0 (H) 0.0 - 100.0 pg/mL    Comment: Please note change in reference range.  Blood culture (routine x 2)     Status: None (Preliminary result)   Collection Time: 11/06/14  2:30 PM  Result Value Ref Range   Specimen Description BLOOD LEFT ARM    Special Requests BOTTLES DRAWN AEROBIC AND ANAEROBIC 6CC    Culture NO GROWTH 2 DAYS    Report Status PENDING   Blood culture (routine x 2)     Status: None (Preliminary result)   Collection Time: 11/06/14  2:37 PM  Result Value Ref Range   Specimen Description BLOOD LEFT HAND    Special Requests BOTTLES DRAWN AEROBIC AND ANAEROBIC 6CC    Culture NO GROWTH 2 DAYS    Report Status PENDING   MRSA PCR Screening     Status: None   Collection Time: 11/06/14  5:00 PM  Result Value Ref Range   MRSA by PCR NEGATIVE NEGATIVE    Comment:        The GeneXpert MRSA Assay (FDA approved for NASAL specimens only), is one component of a comprehensive MRSA colonization surveillance program. It is not intended to diagnose MRSA infection nor to guide or monitor treatment for MRSA infections.   Influenza panel by PCR (type A & B, H1N1)     Status: None   Collection Time:  11/06/14  5:10 PM  Result Value Ref Range   Influenza A By PCR NEGATIVE NEGATIVE   Influenza B By PCR NEGATIVE NEGATIVE   H1N1 flu  by pcr NOT DETECTED NOT DETECTED    Comment:        The Xpert Flu assay (FDA approved for nasal aspirates or washes and nasopharyngeal swab specimens), is intended as an aid in the diagnosis of influenza and should not be used as a sole basis for treatment.   CBC     Status: Abnormal   Collection Time: 11/07/14  5:46 AM  Result Value Ref Range   WBC 45.4 (H) 4.0 - 10.5 K/uL   RBC 2.79 (L) 3.87 - 5.11 MIL/uL   Hemoglobin 8.6 (L) 12.0 - 15.0 g/dL   HCT 27.7 (L) 36.0 - 46.0 %   MCV 99.3 78.0 - 100.0 fL   MCH 30.8 26.0 - 34.0 pg   MCHC 31.0 30.0 - 36.0 g/dL   RDW 16.0 (H) 11.5 - 15.5 %   Platelets 247 150 - 400 K/uL  Basic metabolic panel     Status: Abnormal   Collection Time: 11/07/14  5:46 AM  Result Value Ref Range   Sodium 139 135 - 145 mmol/L    Comment: Please note change in reference range.   Potassium 4.2 3.5 - 5.1 mmol/L    Comment: Please note change in reference range.   Chloride 102 96 - 112 mEq/L   CO2 30 19 - 32 mmol/L   Glucose, Bld 106 (H) 70 - 99 mg/dL   BUN 30 (H) 6 - 23 mg/dL   Creatinine, Ser 1.54 (H) 0.50 - 1.10 mg/dL   Calcium 7.7 (L) 8.4 - 10.5 mg/dL   GFR calc non Af Amer 32 (L) >90 mL/min   GFR calc Af Amer 37 (L) >90 mL/min    Comment: (NOTE) The eGFR has been calculated using the CKD EPI equation. This calculation has not been validated in all clinical situations. eGFR's persistently <90 mL/min signify possible Chronic Kidney Disease.    Anion gap 7 5 - 15  Magnesium     Status: None   Collection Time: 11/07/14  5:46 AM  Result Value Ref Range   Magnesium 1.5 1.5 - 2.5 mg/dL  Phosphorus     Status: None   Collection Time: 11/07/14  5:46 AM  Result Value Ref Range   Phosphorus 3.7 2.3 - 4.6 mg/dL    ABGS No results for input(s): PHART, PO2ART, TCO2, HCO3 in the last 72 hours.  Invalid input(s): PCO2 CULTURES Recent Results (from the past 240 hour(s))  Urine culture     Status: None   Collection Time: 11/06/14  2:10 PM  Result Value  Ref Range Status   Specimen Description URINE, CATHETERIZED  Final   Special Requests NONE  Final   Colony Count NO GROWTH Performed at [facility]   Final   Culture NO GROWTH Performed at [facility]   Final   Report Status 11/07/2014 FINAL  Final  Blood culture (routine x 2)     Status: None (Preliminary result)   Collection Time: 11/06/14  2:30 PM  Result Value Ref Range Status   Specimen Description BLOOD LEFT ARM  Final   Special Requests BOTTLES DRAWN AEROBIC AND ANAEROBIC 6CC  Final   Culture NO GROWTH 2 DAYS  Final   Report Status PENDING  Incomplete  Blood culture (routine x 2)     Status: None (Preliminary result)  Collection Time: 11/06/14  2:37 PM  Result Value Ref Range Status   Specimen Description BLOOD LEFT HAND  Final   Special Requests BOTTLES DRAWN AEROBIC AND ANAEROBIC 6CC  Final   Culture NO GROWTH 2 DAYS  Final   Report Status PENDING  Incomplete  MRSA PCR Screening     Status: None   Collection Time: 11/06/14  5:00 PM  Result Value Ref Range Status   MRSA by PCR NEGATIVE NEGATIVE Final    Comment:        The GeneXpert MRSA Assay (FDA approved for NASAL specimens only), is one component of a comprehensive MRSA colonization surveillance program. It is not intended to diagnose MRSA infection nor to guide or monitor treatment for MRSA infections.    Studies/Results: Dg Chest Portable 1 View  11/06/2014   CLINICAL DATA:  Shortness of breath, wheezing, fever and congestion for 1 day.  EXAM: PORTABLE CHEST - 1 VIEW  COMPARISON:  CT chest 12/29/2013 and single view of the chest 12/29/2013.  FINDINGS: The lungs are emphysematous but clear. Heart size is normal. No pneumothorax or pleural effusion.  IMPRESSION: Emphysema without acute disease.   Electronically Signed   By: Inge Rise M.D.   On: 11/06/2014 14:39    Medications:  Prior to Admission:  Prescriptions prior to admission  Medication Sig Dispense Refill Last Dose  .  acetaminophen (TYLENOL) 325 MG tablet Take 325 mg by mouth 4 (four) times daily as needed for mild pain or moderate pain.    UNKNOWN  . albuterol (PROVENTIL) (2.5 MG/3ML) 0.083% nebulizer solution Take 2.5 mg by nebulization 3 (three) times daily.   11/06/2014 at Unknown time  . alendronate (FOSAMAX) 70 MG tablet Take 70 mg by mouth every Friday. Take with a full glass of water on an empty stomach.   10/30/2014  . ALPRAZolam (XANAX) 1 MG tablet Take 1 mg by mouth at bedtime.   11/05/2014 at Unknown time  . aspirin EC 81 MG tablet Take 81 mg by mouth daily.   11/06/2014 at Unknown time  . budesonide-formoterol (SYMBICORT) 160-4.5 MCG/ACT inhaler Inhale 2 puffs into the lungs 2 (two) times daily.   11/06/2014 at Unknown time  . calcium-vitamin D (OSCAL WITH D) 500-200 MG-UNIT per tablet Take 1 tablet by mouth 2 (two) times daily.   11/06/2014 at Unknown time  . cetirizine (ZYRTEC) 10 MG tablet Take 10 mg by mouth at bedtime.   11/05/2014 at Unknown time  . dextromethorphan-guaiFENesin (ROBITUSSIN-DM) 10-100 MG/5ML liquid Take 10 mLs by mouth every 4 (four) hours as needed for cough.   UNKNOWN  . diltiazem (TIAZAC) 240 MG 24 hr capsule Take 240 mg by mouth every morning.    11/06/2014 at Unknown time  . ferrous sulfate 325 (65 FE) MG tablet Take 325 mg by mouth daily.   11/06/2014 at Unknown time  . FLUoxetine (PROZAC) 20 MG tablet Take 20 mg by mouth every morning.    11/06/2014 at Unknown time  . fluticasone (FLONASE) 50 MCG/ACT nasal spray Place 2 sprays into the nose daily.   11/06/2014 at Unknown time  . furosemide (LASIX) 40 MG tablet Take 40 mg by mouth every morning.    11/06/2014 at Unknown time  . Multiple Vitamin (MULTIVITAMIN WITH MINERALS) TABS tablet Take 1 tablet by mouth daily.   11/06/2014 at Unknown time  . risperiDONE (RISPERDAL) 0.5 MG tablet Take 0.5 mg by mouth at bedtime.   11/05/2014 at Unknown time  . tiotropium (SPIRIVA) 18 MCG  inhalation capsule Place 18 mcg into inhaler and  inhale every morning.   11/06/2014 at Unknown time   Scheduled: . ALPRAZolam  1 mg Oral QHS  . antiseptic oral rinse  7 mL Mouth Rinse BID  . aspirin EC  81 mg Oral Daily  . budesonide-formoterol  2 puff Inhalation BID  . diltiazem  240 mg Oral Daily  . enoxaparin (LOVENOX) injection  40 mg Subcutaneous Q24H  . FLUoxetine  20 mg Oral Daily  . fluticasone  2 spray Each Nare Daily  . guaiFENesin  1,200 mg Oral BID  . ipratropium-albuterol  3 mL Nebulization Q6H  . loratadine  10 mg Oral Daily  . methylPREDNISolone (SOLU-MEDROL) injection  40 mg Intravenous Q6H  . pantoprazole  40 mg Oral Daily  . piperacillin-tazobactam (ZOSYN)  IV  3.375 g Intravenous 3 times per day  . risperiDONE  0.5 mg Oral QHS  . vancomycin  1,250 mg Intravenous Q24H   Continuous: . 0.9 % NaCl with KCl 20 mEq / L 10 mL/hr (11/08/14 3846)   KZL:[id number] chloride, acetaminophen, albuterol, ondansetron (ZOFRAN) IV  Assesment: She was admitted with COPD and is being treated for healthcare associated pneumonia considering her clinical situation. She's now been on antibiotics about 48 hours so I'm going to go ahead and start her on steroids. This may cause her white blood cell count to go up even further. She was septic on admission but does not seem to be septic now. She has acute renal insufficiency which is about the same. She has a history of severe anxiety and depression and they are stable. She's had chronic tachycardia and still is somewhat tachycardic. Active Problems:   Sepsis   Acute renal insufficiency   Leukocytosis   Fever    Plan: Add steroids. Yesterday I thought she might be ready to move from the ICU but I do not think she is today.    LOS: 2 days   Geneveive Furness L 11/08/2014, 9:13 AM

## 2014-11-08 NOTE — Progress Notes (Signed)
Placed on 40% aerosol mask, hopefully this help with upper airway edema or wheezes.

## 2014-11-09 DIAGNOSIS — D649 Anemia, unspecified: Secondary | ICD-10-CM | POA: Diagnosis present

## 2014-11-09 DIAGNOSIS — J189 Pneumonia, unspecified organism: Secondary | ICD-10-CM | POA: Diagnosis present

## 2014-11-09 LAB — CBC WITH DIFFERENTIAL/PLATELET
BASOS PCT: 0 % (ref 0–1)
Basophils Absolute: 0 10*3/uL (ref 0.0–0.1)
EOS ABS: 0 10*3/uL (ref 0.0–0.7)
Eosinophils Relative: 0 % (ref 0–5)
HEMATOCRIT: 25.6 % — AB (ref 36.0–46.0)
HEMOGLOBIN: 8.2 g/dL — AB (ref 12.0–15.0)
Lymphocytes Relative: 3 % — ABNORMAL LOW (ref 12–46)
Lymphs Abs: 0.3 10*3/uL — ABNORMAL LOW (ref 0.7–4.0)
MCH: 31.3 pg (ref 26.0–34.0)
MCHC: 32 g/dL (ref 30.0–36.0)
MCV: 97.7 fL (ref 78.0–100.0)
MONO ABS: 0.2 10*3/uL (ref 0.1–1.0)
MONOS PCT: 2 % — AB (ref 3–12)
Neutro Abs: 9.6 10*3/uL — ABNORMAL HIGH (ref 1.7–7.7)
Neutrophils Relative %: 95 % — ABNORMAL HIGH (ref 43–77)
Platelets: 261 10*3/uL (ref 150–400)
RBC: 2.62 MIL/uL — ABNORMAL LOW (ref 3.87–5.11)
RDW: 15.7 % — ABNORMAL HIGH (ref 11.5–15.5)
WBC: 10.1 10*3/uL (ref 4.0–10.5)

## 2014-11-09 LAB — BASIC METABOLIC PANEL
Anion gap: 7 (ref 5–15)
BUN: 38 mg/dL — ABNORMAL HIGH (ref 6–23)
CHLORIDE: 108 meq/L (ref 96–112)
CO2: 25 mmol/L (ref 19–32)
CREATININE: 1.28 mg/dL — AB (ref 0.50–1.10)
Calcium: 7.2 mg/dL — ABNORMAL LOW (ref 8.4–10.5)
GFR calc non Af Amer: 40 mL/min — ABNORMAL LOW (ref >90)
GFR, EST AFRICAN AMERICAN: 47 mL/min — AB (ref >90)
Glucose, Bld: 138 mg/dL — ABNORMAL HIGH (ref 70–99)
Potassium: 4.4 mmol/L (ref 3.5–5.1)
Sodium: 140 mmol/L (ref 135–145)

## 2014-11-09 LAB — RETICULOCYTES
RBC.: 2.8 MIL/uL — ABNORMAL LOW (ref 3.87–5.11)
RETIC CT PCT: 2 % (ref 0.4–3.1)
Retic Count, Absolute: 56 10*3/uL (ref 19.0–186.0)

## 2014-11-09 LAB — IRON AND TIBC
Iron: 18 ug/dL — ABNORMAL LOW (ref 42–145)
Saturation Ratios: 7 % — ABNORMAL LOW (ref 20–55)
TIBC: 245 ug/dL — AB (ref 250–470)
UIBC: 227 ug/dL (ref 125–400)

## 2014-11-09 MED ORDER — DOCUSATE SODIUM 100 MG PO CAPS
100.0000 mg | ORAL_CAPSULE | Freq: Two times a day (BID) | ORAL | Status: DC
  Administered 2014-11-09 – 2014-11-14 (×9): 100 mg via ORAL
  Filled 2014-11-09 (×10): qty 1

## 2014-11-09 MED ORDER — FUROSEMIDE 10 MG/ML IJ SOLN
40.0000 mg | Freq: Once | INTRAMUSCULAR | Status: AC
  Administered 2014-11-09: 40 mg via INTRAVENOUS
  Filled 2014-11-09: qty 4

## 2014-11-09 NOTE — Care Management Utilization Note (Signed)
UR review.  

## 2014-11-09 NOTE — Care Management Note (Addendum)
    Page 1 of 1   11/12/2014     3:49:49 PM CARE MANAGEMENT NOTE 11/12/2014  Patient:  April Pearson, April Pearson   Account Number:  [id number]  Date Initiated:  11/09/2014  Documentation initiated by:  Theophilus Kinds  Subjective/Objective Assessment:   Pt admitted from [facility] with pneumonia. Pt will return to facility at discharge.     Action/Plan:   CSW is aware and will arrange discharge to facility when medically stable.   Anticipated DC Date:  11/13/2014   Anticipated DC Plan:  ASSISTED LIVING / REST HOME  In-house referral  Clinical Social Worker      DC Planning Services  CM consult      Choice offered to / List presented to:             Status of service:  Completed, signed off Medicare Important Message given?  YES (If response is "NO", the following Medicare IM given date fields will be blank) Date Medicare IM given:  11/12/2014 Medicare IM given by:  Vladimir Creeks Date Additional Medicare IM given:   Additional Medicare IM given by:    Discharge Disposition:  ASSISTED LIVING  Per UR Regulation:  Reviewed for med. necessity/level of care/duration of stay  If discussed at [facility] of Stay Meetings, dates discussed:    Comments:  11/12/14 Cornish RN/CM 11/09/14 Anna, RN BSN CM

## 2014-11-09 NOTE — Progress Notes (Signed)
ANTIBIOTIC CONSULT NOTE  Pharmacy Consult for Vancomycin & Zosyn Indication: sepsis  No Known Allergies  Patient Measurements: Height: 5\' 6"  (167.6 cm) Weight: 191 lb 2.2 oz (86.7 kg) IBW/kg (Calculated) : 59.3  Vital Signs: Temp: 98.1 F (36.7 C) (12/28 0818) Temp Source: Oral (12/28 0818) BP: 132/74 mmHg (12/28 0800) Pulse Rate: 98 (12/28 0800) Intake/Output from previous day: 12/27 0701 - 12/28 0700 In: 1004.7 [P.O.:360; I.V.:244.7; IV Piggyback:400] Out: 1025 [Urine:1025] Intake/Output from this shift: Total I/O In: 10 [I.V.:10] Out: -   Labs:  Recent Labs  11/06/14 1410 11/07/14 0546 11/09/14 0520  WBC 43.3* 45.4* 10.1  HGB 9.5* 8.6* 8.2*  PLT 296 247 261  CREATININE 1.51* 1.54* 1.28*   Estimated Creatinine Clearance: 43.4 mL/min (by C-G formula based on Cr of 1.28). No results for input(s): VANCOTROUGH, VANCOPEAK, VANCORANDOM, GENTTROUGH, GENTPEAK, GENTRANDOM, TOBRATROUGH, TOBRAPEAK, TOBRARND, AMIKACINPEAK, AMIKACINTROU, AMIKACIN in the last 72 hours.   Microbiology: Recent Results (from the past 720 hour(s))  Urine culture     Status: None   Collection Time: 11/06/14  2:10 PM  Result Value Ref Range Status   Specimen Description URINE, CATHETERIZED  Final   Special Requests NONE  Final   Colony Count NO GROWTH Performed at [facility]   Final   Culture NO GROWTH Performed at [facility]   Final   Report Status 11/07/2014 FINAL  Final  Blood culture (routine x 2)     Status: None (Preliminary result)   Collection Time: 11/06/14  2:30 PM  Result Value Ref Range Status   Specimen Description BLOOD LEFT ARM  Final   Special Requests BOTTLES DRAWN AEROBIC AND ANAEROBIC 6CC  Final   Culture NO GROWTH 2 DAYS  Final   Report Status PENDING  Incomplete  Blood culture (routine x 2)     Status: None (Preliminary result)   Collection Time: 11/06/14  2:37 PM  Result Value Ref Range Status   Specimen Description BLOOD LEFT HAND  Final   Special Requests BOTTLES DRAWN AEROBIC AND ANAEROBIC 6CC  Final   Culture NO GROWTH 2 DAYS  Final   Report Status PENDING  Incomplete  MRSA PCR Screening     Status: None   Collection Time: 11/06/14  5:00 PM  Result Value Ref Range Status   MRSA by PCR NEGATIVE NEGATIVE Final    Comment:        The GeneXpert MRSA Assay (FDA approved for NASAL specimens only), is one component of a comprehensive MRSA colonization surveillance program. It is not intended to diagnose MRSA infection nor to guide or monitor treatment for MRSA infections.     Anti-infectives    Start     Dose/Rate Route Frequency Ordered Stop   11/07/14 1000  vancomycin (VANCOCIN) 1,250 mg in sodium chloride 0.9 % 250 mL IVPB     1,250 mg166.7 mL/hr over 90 Minutes Intravenous Every 24 hours 11/06/14 1749     11/06/14 2200  piperacillin-tazobactam (ZOSYN) IVPB 3.375 g     3.375 g12.5 mL/hr over 240 Minutes Intravenous 3 times per day 11/06/14 1749     11/06/14 1445  piperacillin-tazobactam (ZOSYN) IVPB 2.25 g     2.25 g66.7 mL/hr over 30 Minutes Intravenous  Once 11/06/14 1440 11/06/14 1550   11/06/14 1445  vancomycin (VANCOCIN) IVPB 1000 mg/200 mL premix     1,000 mg200 mL/hr over 60 Minutes Intravenous  Once 11/06/14 1440 11/06/14 1730     Assessment: 73 yo F who presented from  nursing home with fever and congestion.  Leukocytosis noted, but lactic acid level normal.   Source of infection unknown - possible cystitis per UA or respiratory illness not identified by CXR.  Scr elevated above patient's baseline but has improved.  Pt currently being treated for HCAP.   Vancomycin 12/25>> Zosyn 12/25>>  Goal of Therapy:  Vancomycin trough level 15-20 mcg/ml  Plan:   Zosyn 3.375gm IV Q8h to be infused over 4hrs  Vancomycin 1250mg  IV q24h  Check Vancomycin trough at steady state  Deescalate antibiotics when improved / appropriate  Monitor renal function and cx data   Hart Robinsons A 11/09/2014,8:59 AM

## 2014-11-09 NOTE — Progress Notes (Signed)
Subjective: She looks much better. She is more alert and awake. She is less short of breath. She has no other new complaints.  Objective: Vital signs in last 24 hours: Temp:  [97.9 F (36.6 C)-98.6 F (37 C)] 98.3 F (36.8 C) (12/28 0400) Pulse Rate:  [89-115] 98 (12/28 0800) Resp:  [18-31] 21 (12/28 0800) BP: (112-169)/(48-137) 132/74 mmHg (12/28 0800) SpO2:  [95 %-99 %] 97 % (12/28 0800) FiO2 (%):  [40 %] 40 % (12/28 0255) Weight:  [86.7 kg (191 lb 2.2 oz)] 86.7 kg (191 lb 2.2 oz) (12/28 0500) Weight change: 1.7 kg (3 lb 12 oz) Last BM Date: 11/05/14  Intake/Output from previous day: 12/27 0701 - 12/28 0700 In: 1004.7 [P.O.:360; I.V.:244.7; IV Piggyback:400] Out: 1025 [Urine:1025]  PHYSICAL EXAM General appearance: alert, cooperative and mild distress Resp: rhonchi bilaterally Cardio: regular rate and rhythm, S1, S2 normal, no murmur, click, rub or gallop GI: soft, non-tender; bowel sounds normal; no masses,  no organomegaly Extremities: extremities normal, atraumatic, no cyanosis or edema  Lab Results:  Results for orders placed or performed during the hospital encounter of 11/06/14 (from the past 48 hour(s))  Urinalysis, Routine w reflex microscopic     Status: Abnormal   Collection Time: 11/08/14  7:40 PM  Result Value Ref Range   Color, Urine YELLOW YELLOW   APPearance CLOUDY (A) CLEAR   Specific Gravity, Urine 1.020 1.005 - 1.030   pH 5.5 5.0 - 8.0   Glucose, UA NEGATIVE NEGATIVE mg/dL   Hgb urine dipstick SMALL (A) NEGATIVE   Bilirubin Urine NEGATIVE NEGATIVE   Ketones, ur TRACE (A) NEGATIVE mg/dL   Protein, ur 30 (A) NEGATIVE mg/dL   Urobilinogen, UA 0.2 0.0 - 1.0 mg/dL   Nitrite NEGATIVE NEGATIVE   Leukocytes, UA SMALL (A) NEGATIVE  Urine microscopic-add on     Status: Abnormal   Collection Time: 11/08/14  7:40 PM  Result Value Ref Range   WBC, UA 21-50 <3 WBC/hpf   RBC / HPF 0-2 <3 RBC/hpf   Bacteria, UA MANY (A) RARE   Crystals URIC ACID CRYSTALS (A)  NEGATIVE  Basic metabolic panel     Status: Abnormal   Collection Time: 11/09/14  5:20 AM  Result Value Ref Range   Sodium 140 135 - 145 mmol/L    Comment: Please note change in reference range.   Potassium 4.4 3.5 - 5.1 mmol/L    Comment: Please note change in reference range.   Chloride 108 96 - 112 mEq/L   CO2 25 19 - 32 mmol/L   Glucose, Bld 138 (H) 70 - 99 mg/dL   BUN 38 (H) 6 - 23 mg/dL   Creatinine, Ser 1.28 (H) 0.50 - 1.10 mg/dL   Calcium 7.2 (L) 8.4 - 10.5 mg/dL   GFR calc non Af Amer 40 (L) >90 mL/min   GFR calc Af Amer 47 (L) >90 mL/min    Comment: (NOTE) The eGFR has been calculated using the CKD EPI equation. This calculation has not been validated in all clinical situations. eGFR's persistently <90 mL/min signify possible Chronic Kidney Disease.    Anion gap 7 5 - 15  CBC with Differential     Status: Abnormal   Collection Time: 11/09/14  5:20 AM  Result Value Ref Range   WBC 10.1 4.0 - 10.5 K/uL   RBC 2.62 (L) 3.87 - 5.11 MIL/uL   Hemoglobin 8.2 (L) 12.0 - 15.0 g/dL   HCT 25.6 (L) 36.0 - 46.0 %   MCV  97.7 78.0 - 100.0 fL   MCH 31.3 26.0 - 34.0 pg   MCHC 32.0 30.0 - 36.0 g/dL   RDW 15.7 (H) 11.5 - 15.5 %   Platelets 261 150 - 400 K/uL   Neutrophils Relative % 95 (H) 43 - 77 %   Neutro Abs 9.6 (H) 1.7 - 7.7 K/uL   Lymphocytes Relative 3 (L) 12 - 46 %   Lymphs Abs 0.3 (L) 0.7 - 4.0 K/uL   Monocytes Relative 2 (L) 3 - 12 %   Monocytes Absolute 0.2 0.1 - 1.0 K/uL   Eosinophils Relative 0 0 - 5 %   Eosinophils Absolute 0.0 0.0 - 0.7 K/uL   Basophils Relative 0 0 - 1 %   Basophils Absolute 0.0 0.0 - 0.1 K/uL    ABGS No results for input(s): PHART, PO2ART, TCO2, HCO3 in the last 72 hours.  Invalid input(s): PCO2 CULTURES Recent Results (from the past 240 hour(s))  Urine culture     Status: None   Collection Time: 11/06/14  2:10 PM  Result Value Ref Range Status   Specimen Description URINE, CATHETERIZED  Final   Special Requests NONE  Final   Colony  Count NO GROWTH Performed at [facility]   Final   Culture NO GROWTH Performed at [facility]   Final   Report Status 11/07/2014 FINAL  Final  Blood culture (routine x 2)     Status: None (Preliminary result)   Collection Time: 11/06/14  2:30 PM  Result Value Ref Range Status   Specimen Description BLOOD LEFT ARM  Final   Special Requests BOTTLES DRAWN AEROBIC AND ANAEROBIC 6CC  Final   Culture NO GROWTH 2 DAYS  Final   Report Status PENDING  Incomplete  Blood culture (routine x 2)     Status: None (Preliminary result)   Collection Time: 11/06/14  2:37 PM  Result Value Ref Range Status   Specimen Description BLOOD LEFT HAND  Final   Special Requests BOTTLES DRAWN AEROBIC AND ANAEROBIC 6CC  Final   Culture NO GROWTH 2 DAYS  Final   Report Status PENDING  Incomplete  MRSA PCR Screening     Status: None   Collection Time: 11/06/14  5:00 PM  Result Value Ref Range Status   MRSA by PCR NEGATIVE NEGATIVE Final    Comment:        The GeneXpert MRSA Assay (FDA approved for NASAL specimens only), is one component of a comprehensive MRSA colonization surveillance program. It is not intended to diagnose MRSA infection nor to guide or monitor treatment for MRSA infections.    Studies/Results: Dg Chest 2 View  11/08/2014   CLINICAL DATA:  Expiatory wheezing  EXAM: CHEST  2 VIEW  COMPARISON:  11/06/2014  FINDINGS: Cardiomediastinal silhouette is stable. Hyperinflation again noted. Persistent bilateral basilar atelectasis or scarring. There is streaky airspace disease in right upper lobe peripheral best seen on lateral view. This is highly suspicious for superimposed infiltrate/ pneumonia. Follow-up to resolution is recommended.  IMPRESSION: Persistent bilateral basilar atelectasis or scarring. There is streaky airspace disease in right upper lobe peripheral best seen on lateral view. This is highly suspicious for superimposed infiltrate/ pneumonia. Follow-up to resolution  is recommended.   Electronically Signed   By: Lahoma Crocker M.D.   On: 11/08/2014 10:35    Medications:  Prior to Admission:  Prescriptions prior to admission  Medication Sig Dispense Refill Last Dose  . acetaminophen (TYLENOL) 325 MG tablet Take 325 mg  by mouth 4 (four) times daily as needed for mild pain or moderate pain.    UNKNOWN  . albuterol (PROVENTIL) (2.5 MG/3ML) 0.083% nebulizer solution Take 2.5 mg by nebulization 3 (three) times daily.   11/06/2014 at Unknown time  . alendronate (FOSAMAX) 70 MG tablet Take 70 mg by mouth every Friday. Take with a full glass of water on an empty stomach.   10/30/2014  . ALPRAZolam (XANAX) 1 MG tablet Take 1 mg by mouth at bedtime.   11/05/2014 at Unknown time  . aspirin EC 81 MG tablet Take 81 mg by mouth daily.   11/06/2014 at Unknown time  . budesonide-formoterol (SYMBICORT) 160-4.5 MCG/ACT inhaler Inhale 2 puffs into the lungs 2 (two) times daily.   11/06/2014 at Unknown time  . calcium-vitamin D (OSCAL WITH D) 500-200 MG-UNIT per tablet Take 1 tablet by mouth 2 (two) times daily.   11/06/2014 at Unknown time  . cetirizine (ZYRTEC) 10 MG tablet Take 10 mg by mouth at bedtime.   11/05/2014 at Unknown time  . dextromethorphan-guaiFENesin (ROBITUSSIN-DM) 10-100 MG/5ML liquid Take 10 mLs by mouth every 4 (four) hours as needed for cough.   UNKNOWN  . diltiazem (TIAZAC) 240 MG 24 hr capsule Take 240 mg by mouth every morning.    11/06/2014 at Unknown time  . ferrous sulfate 325 (65 FE) MG tablet Take 325 mg by mouth daily.   11/06/2014 at Unknown time  . FLUoxetine (PROZAC) 20 MG tablet Take 20 mg by mouth every morning.    11/06/2014 at Unknown time  . fluticasone (FLONASE) 50 MCG/ACT nasal spray Place 2 sprays into the nose daily.   11/06/2014 at Unknown time  . furosemide (LASIX) 40 MG tablet Take 40 mg by mouth every morning.    11/06/2014 at Unknown time  . Multiple Vitamin (MULTIVITAMIN WITH MINERALS) TABS tablet Take 1 tablet by mouth daily.    11/06/2014 at Unknown time  . risperiDONE (RISPERDAL) 0.5 MG tablet Take 0.5 mg by mouth at bedtime.   11/05/2014 at Unknown time  . tiotropium (SPIRIVA) 18 MCG inhalation capsule Place 18 mcg into inhaler and inhale every morning.   11/06/2014 at Unknown time   Scheduled: . ALPRAZolam  1 mg Oral QHS  . antiseptic oral rinse  7 mL Mouth Rinse BID  . aspirin EC  81 mg Oral Daily  . budesonide-formoterol  2 puff Inhalation BID  . diltiazem  240 mg Oral Daily  . docusate sodium  100 mg Oral BID  . enoxaparin (LOVENOX) injection  40 mg Subcutaneous Q24H  . FLUoxetine  20 mg Oral Daily  . fluticasone  2 spray Each Nare Daily  . guaiFENesin  1,200 mg Oral BID  . ipratropium-albuterol  3 mL Nebulization Q6H  . loratadine  10 mg Oral Daily  . methylPREDNISolone (SOLU-MEDROL) injection  40 mg Intravenous Q6H  . pantoprazole  40 mg Oral Daily  . piperacillin-tazobactam (ZOSYN)  IV  3.375 g Intravenous 3 times per day  . risperiDONE  0.5 mg Oral QHS  . vancomycin  1,250 mg Intravenous Q24H   Continuous: . 0.9 % NaCl with KCl 20 mEq / L 10 mL/hr at 11/09/14 0800   KZL:DJTTSV chloride, acetaminophen, albuterol, ondansetron (ZOFRAN) IV  Assesment: She was admitted with sepsis and respiratory failure. She is much better. She's been treated for healthcare associated pneumonia which clinically I believe she has. She has severe COPD. She has a history of bladder cancer and she is more anemic than on admission.  She will need stools checked for blood and will have anemia profile. I think she is ready for transfer from the ICU Active Problems:   Sepsis   Acute renal insufficiency   Leukocytosis   Fever    Plan: Transfer from ICU    LOS: 3 days   Deshonda Cryderman L 11/09/2014, 8:11 AM

## 2014-11-09 NOTE — Progress Notes (Signed)
Possible slight upper airway wheezes improvement with aerosol.

## 2014-11-09 NOTE — Progress Notes (Signed)
Pt transferring to unit 300 today. Pt/family is aware and agreeable to transfer. Assessment is unchanged from this morning and receiving RN has been given report. Belongings sent with pt at bedside.  

## 2014-11-10 LAB — CBC WITH DIFFERENTIAL/PLATELET
Basophils Absolute: 0 10*3/uL (ref 0.0–0.1)
Basophils Relative: 0 % (ref 0–1)
EOS ABS: 0 10*3/uL (ref 0.0–0.7)
Eosinophils Relative: 0 % (ref 0–5)
HCT: 25.8 % — ABNORMAL LOW (ref 36.0–46.0)
Hemoglobin: 8.2 g/dL — ABNORMAL LOW (ref 12.0–15.0)
LYMPHS PCT: 3 % — AB (ref 12–46)
Lymphs Abs: 0.4 10*3/uL — ABNORMAL LOW (ref 0.7–4.0)
MCH: 30.8 pg (ref 26.0–34.0)
MCHC: 31.8 g/dL (ref 30.0–36.0)
MCV: 97 fL (ref 78.0–100.0)
Monocytes Absolute: 0.6 10*3/uL (ref 0.1–1.0)
Monocytes Relative: 5 % (ref 3–12)
Neutro Abs: 10.5 10*3/uL — ABNORMAL HIGH (ref 1.7–7.7)
Neutrophils Relative %: 92 % — ABNORMAL HIGH (ref 43–77)
PLATELETS: 259 10*3/uL (ref 150–400)
RBC: 2.66 MIL/uL — ABNORMAL LOW (ref 3.87–5.11)
RDW: 15.8 % — AB (ref 11.5–15.5)
WBC: 11.4 10*3/uL — AB (ref 4.0–10.5)

## 2014-11-10 LAB — URINE CULTURE
CULTURE: NO GROWTH
Colony Count: NO GROWTH

## 2014-11-10 LAB — FERRITIN: Ferritin: 92 ng/mL (ref 10–291)

## 2014-11-10 LAB — VITAMIN B12: Vitamin B-12: 1218 pg/mL — ABNORMAL HIGH (ref 211–911)

## 2014-11-10 LAB — FOLATE: Folate: 17.8 ng/mL

## 2014-11-10 NOTE — Evaluation (Signed)
Physical Therapy Evaluation Patient Details Name: April Pearson MRN: [id number] DOB: 12/19/1940 Today's Date: 11/10/2014   History of Present Illness  Who is a resident of [facility] with a history of COPD, bladder cancer, anxiety, osteoporosis, GERD who was brought to the emergency department due to new-onset of lethargy and fever, which started earlier today. Her max temperature before arrival was 101.8. She did have some ibuprofen, which helped with the fever. There are associated symptoms of cough and congestion, which started yesterday. She has some mild difficulty breathing, which is relieved with oxygen. No provoking factors.  Clinical Impression   Pt was seen for evaluation today.  She has no c/o and reports that she is fairly sedentary at baseline although she is able to ambulate with no assistive device [facility].  She is O2 dependent on 2 L O2 at baseline but is on 4 L O2 now.  Her O2 sat at rest is 99%  And after gait it is 91%.  She is weak and deconditioned from baseline and now needs a walker for gait.  She [facility] one at the [facility].  She was able to walk 30' with standby assist.  I am recommending  HHPT at d/c.  Pt is agreeable to this.    Follow Up Recommendations Home health PT    Equipment Recommendations  None recommended by PT    Recommendations for Other Services   none    Precautions / Restrictions Precautions Precautions: Fall Restrictions Weight Bearing Restrictions: No      Mobility  Bed Mobility Overal bed mobility: Needs Assistance Bed Mobility: Supine to Sit     Supine to sit: Supervision        Transfers Overall transfer level: Needs assistance Equipment used: Rolling walker (2 wheeled)   Sit to Stand: Supervision            Ambulation/Gait Ambulation/Gait assistance: Min guard Ambulation Distance (Feet): 30 Feet Assistive device: Rolling walker (2 wheeled) Gait Pattern/deviations: Trunk  flexed;Shuffle   Gait velocity interpretation: Below normal speed for age/gender General Gait Details: pt now needs a walker for gait stability  Stairs            Wheelchair Mobility    Modified Rankin (Stroke Patients Only)       Balance Overall balance assessment: No apparent balance deficits (not formally assessed)                                           Pertinent Vitals/Pain Pain Assessment: No/denies pain    Home Living Family/patient expects to be discharged to:: Assisted living               Home Equipment: Walker - 4 wheels;Bedside commode      Prior Function Level of Independence: Needs assistance   Gait / Transfers Assistance Needed: ambulates and transfers independently with no assistive device  ADL's / Homemaking Assistance Needed: needs assist with dressing but all other ADLs are independent        Hand Dominance        Extremity/Trunk Assessment   Upper Extremity Assessment: Generalized weakness           Lower Extremity Assessment: Generalized weakness      Cervical / Trunk Assessment: Kyphotic  Communication   Communication: Expressive difficulties (some slurring of speech)  Cognition Arousal/Alertness: Awake/alert  Behavior During Therapy: WFL for tasks assessed/performed Overall Cognitive Status: Within Functional Limits for tasks assessed                      General Comments      Exercises        Assessment/Plan    PT Assessment Patient needs continued PT services  PT Diagnosis Difficulty walking;Generalized weakness   PT Problem List Decreased strength;Decreased activity tolerance;Decreased mobility  PT Treatment Interventions Gait training;Functional mobility training;Therapeutic exercise   PT Goals (Current goals can be found in the Care Plan section) Acute Rehab PT Goals Patient Stated Goal: none stated PT Goal Formulation: With patient Time For Goal Achievement:  11/24/14 Potential to Achieve Goals: Good    Frequency Min 3X/week   Barriers to discharge   none    Co-evaluation               End of Session Equipment Utilized During Treatment: Gait belt Activity Tolerance: Patient tolerated treatment well Patient left: in chair;with call bell/phone within reach (no chair alarm available) Nurse Communication: Mobility status         Time: 1025-1100 PT Time Calculation (min) (ACUTE ONLY): 35 min   Charges:   PT Evaluation $Initial PT Evaluation Tier I: 1 Procedure     PT G CodesOwens Shark, Reace Breshears L 11/10/2014, 11:12 AM

## 2014-11-10 NOTE — Plan of Care (Signed)
Problem: Phase I Progression Outcomes Goal: OOB as tolerated unless otherwise ordered Outcome: Completed/Met Date Met:  11/10/14 Pt OOB to chair with minimal assist.  Goal: Initial discharge plan identified Outcome: Completed/Met Date Met:  11/10/14 Pt to discharge back to [facility] at discharge.

## 2014-11-10 NOTE — Clinical Social Work Psychosocial (Signed)
Clinical Social Work Department BRIEF PSYCHOSOCIAL ASSESSMENT 11/10/2014  Patient:  April Pearson, April Pearson     Account Number:  [id number]     Admit date:  11/06/2014  Clinical Social Worker:  April Pearson  Date/Time:  11/10/2014 10:12 AM  Referred by:  CSW  Date Referred:  11/10/2014 Referred for  ALF Placement   Other Referral:   Interview type:  Patient Other interview type:   sister- [place]    PSYCHOSOCIAL DATA Living Status:  FACILITY Admitted from facility:  Other Level of care:  [place] Primary support name:  April Pearson Primary support relationship to patient:  [place] Degree of support available:   supportive    CURRENT CONCERNS Current Concerns  Post-Acute Placement   Other Concerns:    SOCIAL WORK ASSESSMENT / PLAN CSW met with pt at bedside. Pt alert and oriented and reports that she has been a resident at [facility] for about 5-6 years. She states she has no children and her sisters, April Pearson and April Pearson are her best support. They visit pt regularly at facility. April Pearson reports that pt's sats dropped and she had a fever. Admitted with sepsis. At baseline, pt is fairly independent and ambulates without assistive device. She requires assist with bathing. Pt is okay to return at d/c. Pt to evaluate pt today for recommendations. April Pearson confirmed information above and also very much wants pt to return to [facility].   Assessment/plan status:  Psychosocial Support/Ongoing Assessment of Needs Other assessment/ plan:   Information/referral to community resources:   [place]    PATIENT'S/FAMILY'S RESPONSE TO PLAN OF CARE: Pt reports that she "really likes" April Pearson's facility and wants to return. CSW will continue to follow.       April Pearson, [place]

## 2014-11-10 NOTE — Progress Notes (Signed)
Subjective: She says she feels better. She is still weak. She's coughing a little bit. No other new complaints.  Objective: Vital signs in last 24 hours: Temp:  [97.8 F (36.6 C)-98 F (36.7 C)] 98 F (36.7 C) (12/29 0500) Pulse Rate:  [88-102] 88 (12/29 0500) Resp:  [20] 20 (12/29 0500) BP: (129-134)/(61-110) 132/61 mmHg (12/29 0500) SpO2:  [91 %-99 %] 99 % (12/29 0653) Weight:  [82.101 kg (181 lb)] 82.101 kg (181 lb) (12/29 0500) Weight change: -4.599 kg (-10 lb 2.2 oz) Last BM Date: 11/09/14  Intake/Output from previous day: 12/28 0701 - 12/29 0700 In: 634.7 [I.V.:234.7; IV Piggyback:400] Out: 400 [Urine:400]  PHYSICAL EXAM General appearance: alert, cooperative and mild distress Resp: rhonchi bilaterally Cardio: regular rate and rhythm, S1, S2 normal, no murmur, click, rub or gallop GI: soft, non-tender; bowel sounds normal; no masses,  no organomegaly Extremities: extremities normal, atraumatic, no cyanosis or edema  Lab Results:  Results for orders placed or performed during the hospital encounter of 11/06/14 (from the past 48 hour(s))  Urinalysis, Routine w reflex microscopic     Status: Abnormal   Collection Time: 11/08/14  7:40 PM  Result Value Ref Range   Color, Urine YELLOW YELLOW   APPearance CLOUDY (A) CLEAR   Specific Gravity, Urine 1.020 1.005 - 1.030   pH 5.5 5.0 - 8.0   Glucose, UA NEGATIVE NEGATIVE mg/dL   Hgb urine dipstick SMALL (A) NEGATIVE   Bilirubin Urine NEGATIVE NEGATIVE   Ketones, ur TRACE (A) NEGATIVE mg/dL   Protein, ur 30 (A) NEGATIVE mg/dL   Urobilinogen, UA 0.2 0.0 - 1.0 mg/dL   Nitrite NEGATIVE NEGATIVE   Leukocytes, UA SMALL (A) NEGATIVE  Urine microscopic-add on     Status: Abnormal   Collection Time: 11/08/14  7:40 PM  Result Value Ref Range   WBC, UA 21-50 <3 WBC/hpf   RBC / HPF 0-2 <3 RBC/hpf   Bacteria, UA MANY (A) RARE   Crystals URIC ACID CRYSTALS (A) NEGATIVE  Basic metabolic panel     Status: Abnormal   Collection Time:  11/09/14  5:20 AM  Result Value Ref Range   Sodium 140 135 - 145 mmol/L    Comment: Please note change in reference range.   Potassium 4.4 3.5 - 5.1 mmol/L    Comment: Please note change in reference range.   Chloride 108 96 - 112 mEq/L   CO2 25 19 - 32 mmol/L   Glucose, Bld 138 (H) 70 - 99 mg/dL   BUN 38 (H) 6 - 23 mg/dL   Creatinine, Ser 1.28 (H) 0.50 - 1.10 mg/dL   Calcium 7.2 (L) 8.4 - 10.5 mg/dL   GFR calc non Af Amer 40 (L) >90 mL/min   GFR calc Af Amer 47 (L) >90 mL/min    Comment: (NOTE) The eGFR has been calculated using the CKD EPI equation. This calculation has not been validated in all clinical situations. eGFR's persistently <90 mL/min signify possible Chronic Kidney Disease.    Anion gap 7 5 - 15  CBC with Differential     Status: Abnormal   Collection Time: 11/09/14  5:20 AM  Result Value Ref Range   WBC 10.1 4.0 - 10.5 K/uL   RBC 2.62 (L) 3.87 - 5.11 MIL/uL   Hemoglobin 8.2 (L) 12.0 - 15.0 g/dL   HCT 25.6 (L) 36.0 - 46.0 %   MCV 97.7 78.0 - 100.0 fL   MCH 31.3 26.0 - 34.0 pg   MCHC 32.0 30.0 -  36.0 g/dL   RDW 15.7 (H) 11.5 - 15.5 %   Platelets 261 150 - 400 K/uL   Neutrophils Relative % 95 (H) 43 - 77 %   Neutro Abs 9.6 (H) 1.7 - 7.7 K/uL   Lymphocytes Relative 3 (L) 12 - 46 %   Lymphs Abs 0.3 (L) 0.7 - 4.0 K/uL   Monocytes Relative 2 (L) 3 - 12 %   Monocytes Absolute 0.2 0.1 - 1.0 K/uL   Eosinophils Relative 0 0 - 5 %   Eosinophils Absolute 0.0 0.0 - 0.7 K/uL   Basophils Relative 0 0 - 1 %   Basophils Absolute 0.0 0.0 - 0.1 K/uL  Vitamin B12     Status: Abnormal   Collection Time: 11/09/14 11:25 AM  Result Value Ref Range   Vitamin B-12 1218 (H) 211 - 911 pg/mL    Comment: Performed at [facility]  Folate     Status: None   Collection Time: 11/09/14 11:25 AM  Result Value Ref Range   Folate 17.8 ng/mL    Comment: (NOTE) Reference Ranges        Deficient:       0.4 - 3.3 ng/mL        Indeterminate:   3.4 - 5.4 ng/mL        Normal:               > 5.4 ng/mL Performed at [facility]   Iron and TIBC     Status: Abnormal   Collection Time: 11/09/14 11:25 AM  Result Value Ref Range   Iron 18 (L) 42 - 145 ug/dL   TIBC 245 (L) 250 - 470 ug/dL   Saturation Ratios 7 (L) 20 - 55 %   UIBC 227 125 - 400 ug/dL    Comment: Performed at [facility]  Ferritin     Status: None   Collection Time: 11/09/14 11:25 AM  Result Value Ref Range   Ferritin 92 10 - 291 ng/mL    Comment: Performed at [facility]  Reticulocytes     Status: Abnormal   Collection Time: 11/09/14 11:25 AM  Result Value Ref Range   Retic Ct Pct 2.0 0.4 - 3.1 %   RBC. 2.80 (L) 3.87 - 5.11 MIL/uL   Retic Count, Manual 56.0 19.0 - 186.0 K/uL  CBC with Differential     Status: Abnormal   Collection Time: 11/10/14  7:11 AM  Result Value Ref Range   WBC 11.4 (H) 4.0 - 10.5 K/uL   RBC 2.66 (L) 3.87 - 5.11 MIL/uL   Hemoglobin 8.2 (L) 12.0 - 15.0 g/dL   HCT 25.8 (L) 36.0 - 46.0 %   MCV 97.0 78.0 - 100.0 fL   MCH 30.8 26.0 - 34.0 pg   MCHC 31.8 30.0 - 36.0 g/dL   RDW 15.8 (H) 11.5 - 15.5 %   Platelets 259 150 - 400 K/uL   Neutrophils Relative % 92 (H) 43 - 77 %   Neutro Abs 10.5 (H) 1.7 - 7.7 K/uL   Lymphocytes Relative 3 (L) 12 - 46 %   Lymphs Abs 0.4 (L) 0.7 - 4.0 K/uL   Monocytes Relative 5 3 - 12 %   Monocytes Absolute 0.6 0.1 - 1.0 K/uL   Eosinophils Relative 0 0 - 5 %   Eosinophils Absolute 0.0 0.0 - 0.7 K/uL   Basophils Relative 0 0 - 1 %   Basophils Absolute 0.0 0.0 - 0.1 K/uL  ABGS No results for input(s): PHART, PO2ART, TCO2, HCO3 in the last 72 hours.  Invalid input(s): PCO2 CULTURES Recent Results (from the past 240 hour(s))  Urine culture     Status: None   Collection Time: 11/06/14  2:10 PM  Result Value Ref Range Status   Specimen Description URINE, CATHETERIZED  Final   Special Requests NONE  Final   Colony Count NO GROWTH Performed at [facility]   Final   Culture NO GROWTH Performed at [facility]   Final   Report Status 11/07/2014 FINAL  Final  Blood culture (routine x 2)     Status: None (Preliminary result)   Collection Time: 11/06/14  2:30 PM  Result Value Ref Range Status   Specimen Description BLOOD LEFT ARM  Final   Special Requests BOTTLES DRAWN AEROBIC AND ANAEROBIC 6CC  Final   Culture NO GROWTH 3 DAYS  Final   Report Status PENDING  Incomplete  Blood culture (routine x 2)     Status: None (Preliminary result)   Collection Time: 11/06/14  2:37 PM  Result Value Ref Range Status   Specimen Description BLOOD LEFT HAND  Final   Special Requests BOTTLES DRAWN AEROBIC AND ANAEROBIC 6CC  Final   Culture NO GROWTH 3 DAYS  Final   Report Status PENDING  Incomplete  MRSA PCR Screening     Status: None   Collection Time: 11/06/14  5:00 PM  Result Value Ref Range Status   MRSA by PCR NEGATIVE NEGATIVE Final    Comment:        The GeneXpert MRSA Assay (FDA approved for NASAL specimens only), is one component of a comprehensive MRSA colonization surveillance program. It is not intended to diagnose MRSA infection nor to guide or monitor treatment for MRSA infections.    Studies/Results: No results found.  Medications:  Prior to Admission:  Prescriptions prior to admission  Medication Sig Dispense Refill Last Dose  . acetaminophen (TYLENOL) 325 MG tablet Take 325 mg by mouth 4 (four) times daily as needed for mild pain or moderate pain.    UNKNOWN  . albuterol (PROVENTIL) (2.5 MG/3ML) 0.083% nebulizer solution Take 2.5 mg by nebulization 3 (three) times daily.   11/06/2014 at Unknown time  . alendronate (FOSAMAX) 70 MG tablet Take 70 mg by mouth every Friday. Take with a full glass of water on an empty stomach.   10/30/2014  . ALPRAZolam (XANAX) 1 MG tablet Take 1 mg by mouth at bedtime.   11/05/2014 at Unknown time  . aspirin EC 81 MG tablet Take 81 mg by mouth daily.   11/06/2014 at Unknown time  . budesonide-formoterol (SYMBICORT) 160-4.5 MCG/ACT inhaler  Inhale 2 puffs into the lungs 2 (two) times daily.   11/06/2014 at Unknown time  . calcium-vitamin D (OSCAL WITH D) 500-200 MG-UNIT per tablet Take 1 tablet by mouth 2 (two) times daily.   11/06/2014 at Unknown time  . cetirizine (ZYRTEC) 10 MG tablet Take 10 mg by mouth at bedtime.   11/05/2014 at Unknown time  . dextromethorphan-guaiFENesin (ROBITUSSIN-DM) 10-100 MG/5ML liquid Take 10 mLs by mouth every 4 (four) hours as needed for cough.   UNKNOWN  . diltiazem (TIAZAC) 240 MG 24 hr capsule Take 240 mg by mouth every morning.    11/06/2014 at Unknown time  . ferrous sulfate 325 (65 FE) MG tablet Take 325 mg by mouth daily.   11/06/2014 at Unknown time  . FLUoxetine (PROZAC) 20 MG tablet Take 20  mg by mouth every morning.    11/06/2014 at Unknown time  . fluticasone (FLONASE) 50 MCG/ACT nasal spray Place 2 sprays into the nose daily.   11/06/2014 at Unknown time  . furosemide (LASIX) 40 MG tablet Take 40 mg by mouth every morning.    11/06/2014 at Unknown time  . Multiple Vitamin (MULTIVITAMIN WITH MINERALS) TABS tablet Take 1 tablet by mouth daily.   11/06/2014 at Unknown time  . risperiDONE (RISPERDAL) 0.5 MG tablet Take 0.5 mg by mouth at bedtime.   11/05/2014 at Unknown time  . tiotropium (SPIRIVA) 18 MCG inhalation capsule Place 18 mcg into inhaler and inhale every morning.   11/06/2014 at Unknown time   Scheduled: . ALPRAZolam  1 mg Oral QHS  . antiseptic oral rinse  7 mL Mouth Rinse BID  . aspirin EC  81 mg Oral Daily  . budesonide-formoterol  2 puff Inhalation BID  . diltiazem  240 mg Oral Daily  . docusate sodium  100 mg Oral BID  . enoxaparin (LOVENOX) injection  40 mg Subcutaneous Q24H  . FLUoxetine  20 mg Oral Daily  . fluticasone  2 spray Each Nare Daily  . guaiFENesin  1,200 mg Oral BID  . ipratropium-albuterol  3 mL Nebulization Q6H  . loratadine  10 mg Oral Daily  . methylPREDNISolone (SOLU-MEDROL) injection  40 mg Intravenous Q6H  . pantoprazole  40 mg Oral Daily  .  piperacillin-tazobactam (ZOSYN)  IV  3.375 g Intravenous 3 times per day  . risperiDONE  0.5 mg Oral QHS  . vancomycin  1,250 mg Intravenous Q24H   Continuous: . 0.9 % NaCl with KCl 20 mEq / L 10 mL/hr at 11/09/14 0900   YTK:ZSWFUX chloride, acetaminophen, albuterol, ondansetron (ZOFRAN) IV  Assesment: She has healthcare associated pneumonia and COPD with exacerbation. She was septic on admission but that has improved. She is slowly getting better. Principal Problem:   HCAP (healthcare-associated pneumonia) Active Problems:   COPD with acute exacerbation   Hypertension   Bipolar disease, chronic   Tachycardia   Bladder cancer   Sepsis   Acute renal insufficiency   Leukocytosis   Fever   Anemia    Plan: Continue current treatments. Try to get her up and moving around more    LOS: 4 days   Quynh Basso L 11/10/2014, 8:52 AM

## 2014-11-11 LAB — BASIC METABOLIC PANEL
ANION GAP: 7 (ref 5–15)
BUN: 57 mg/dL — ABNORMAL HIGH (ref 6–23)
CHLORIDE: 108 meq/L (ref 96–112)
CO2: 28 mmol/L (ref 19–32)
CREATININE: 2.11 mg/dL — AB (ref 0.50–1.10)
Calcium: 8.8 mg/dL (ref 8.4–10.5)
GFR, EST AFRICAN AMERICAN: 26 mL/min — AB (ref >90)
GFR, EST NON AFRICAN AMERICAN: 22 mL/min — AB (ref >90)
Glucose, Bld: 146 mg/dL — ABNORMAL HIGH (ref 70–99)
Potassium: 3.9 mmol/L (ref 3.5–5.1)
Sodium: 143 mmol/L (ref 135–145)

## 2014-11-11 LAB — CULTURE, BLOOD (ROUTINE X 2)
CULTURE: NO GROWTH
Culture: NO GROWTH

## 2014-11-11 LAB — VANCOMYCIN, TROUGH: Vancomycin Tr: 34.8 ug/mL (ref 10.0–20.0)

## 2014-11-11 MED ORDER — ENOXAPARIN SODIUM 30 MG/0.3ML ~~LOC~~ SOLN
30.0000 mg | SUBCUTANEOUS | Status: DC
  Administered 2014-11-11 – 2014-11-13 (×3): 30 mg via SUBCUTANEOUS
  Filled 2014-11-11 (×3): qty 0.3

## 2014-11-11 MED ORDER — LEVOFLOXACIN 250 MG PO TABS
250.0000 mg | ORAL_TABLET | Freq: Once | ORAL | Status: AC
  Administered 2014-11-11: 250 mg via ORAL
  Filled 2014-11-11: qty 1

## 2014-11-11 MED ORDER — FERROUS SULFATE 325 (65 FE) MG PO TABS
325.0000 mg | ORAL_TABLET | Freq: Two times a day (BID) | ORAL | Status: DC
  Administered 2014-11-11 – 2014-11-14 (×6): 325 mg via ORAL
  Filled 2014-11-11 (×6): qty 1

## 2014-11-11 MED ORDER — PREDNISONE 20 MG PO TABS
40.0000 mg | ORAL_TABLET | Freq: Every day | ORAL | Status: DC
  Administered 2014-11-11 – 2014-11-14 (×4): 40 mg via ORAL
  Filled 2014-11-11 (×4): qty 2

## 2014-11-11 MED ORDER — LEVOFLOXACIN 750 MG PO TABS: 750.0000 mg | ORAL_TABLET | ORAL | Status: DC

## 2014-11-11 MED ORDER — LEVOFLOXACIN 500 MG PO TABS
500.0000 mg | ORAL_TABLET | ORAL | Status: DC
  Administered 2014-11-13: 500 mg via ORAL
  Filled 2014-11-11 (×2): qty 1

## 2014-11-11 MED ORDER — LEVOFLOXACIN 500 MG PO TABS
500.0000 mg | ORAL_TABLET | Freq: Every day | ORAL | Status: DC
  Administered 2014-11-11: 500 mg via ORAL
  Filled 2014-11-11: qty 1

## 2014-11-11 NOTE — Progress Notes (Signed)
Physical Therapy Session Note  Patient Details  Name: NAKISHA CHAI MRN: [id number] Date of Birth: 09-Nov-1941  Today's Date: 11/11/2014 PT Individual Time:  -  [id number]     Short Term Goals: Pt will ambulate: Progressing with RW 30 feet   Skilled Therapeutic Interventions/Progress Updates:    Bed mobility training with multimodal cueing for hand and foot placement with supervision/minassist.  Gait training with cueing to increase stride length for more normalized gait mechanics and energy conservation, pt with tendency to shuffle, cueing to pick up feet.  Therex complete for LE strengthening.  Pt left in chair at end of session with chair alarm set and call bell within reach.  Pt limited by fatigue with activities, no reports of pain.  Therapy Documentation Precautions:  Precautions Precautions: Fall Restrictions Weight Bearing Restrictions: No Vital Signs: Therapy Vitals Temp: 98.1 F (36.7 C) Temp Source: Oral Pulse Rate: 86 Resp: 20 BP: 123/63 mmHg Patient Position (if appropriate): Lying Oxygen Therapy SpO2: 99 % O2 Device: Nasal Cannula O2 Flow Rate (L/min): 2 L/min Pain:  0/10 Mobility:   supervision with cueing for hand placement Locomotion : Ambulation Ambulation Distance (Feet): 30 Feet  Exercises: General Exercises - Lower Extremity Ankle Circles/Pumps: AROM;Both;10 reps;Supine Long Arc Quad: AROM;10 reps;Seated Hip Flexion/Marching: AROM;10 reps;Seated Other Exercises Other Exercises: Sit to stand 5x cueing for hand and foot placement Other Treatments:    See FIM for current functional status  Therapy/Group: Individual Therapy  Aldona Lento 11/11/2014, 5:59 PM

## 2014-11-11 NOTE — Progress Notes (Signed)
ANTIBIOTIC CONSULT NOTE  Pharmacy Consult for Levaquin Indication: HCAP  No Known Allergies  Patient Measurements: Height: 5\' 6"  (167.6 cm) Weight: 180 lb 4.8 oz (81.784 kg) IBW/kg (Calculated) : 59.3  Vital Signs: Temp: 98 F (36.7 C) (12/30 0601) Temp Source: Oral (12/30 0601) BP: 140/59 mmHg (12/30 0601) Pulse Rate: 77 (12/30 0601) Intake/Output from previous day: 12/29 0701 - 12/30 0700 In: 1124.5 [P.O.:480; I.V.:244.5; IV Piggyback:400] Out: 300 [Urine:300] Intake/Output from this shift: Total I/O In: 240 [P.O.:240] Out: 400 [Urine:400]  Labs:  Recent Labs  11/09/14 0520 11/10/14 0711 11/11/14 0926  WBC 10.1 11.4*  --   HGB 8.2* 8.2*  --   PLT 261 259  --   CREATININE 1.28*  --  2.11*   Estimated Creatinine Clearance: 25.6 mL/min (by C-G formula based on Cr of 2.11).  Recent Labs  11/11/14 0915  Burns 34.8*     Microbiology: Recent Results (from the past 720 hour(s))  Urine culture     Status: None   Collection Time: 11/06/14  2:10 PM  Result Value Ref Range Status   Specimen Description URINE, CATHETERIZED  Final   Special Requests NONE  Final   Colony Count NO GROWTH Performed at [facility]   Final   Culture NO GROWTH Performed at [facility]   Final   Report Status 11/07/2014 FINAL  Final  Blood culture (routine x 2)     Status: None   Collection Time: 11/06/14  2:30 PM  Result Value Ref Range Status   Specimen Description BLOOD LEFT ARM  Final   Special Requests BOTTLES DRAWN AEROBIC AND ANAEROBIC 6CC  Final   Culture NO GROWTH 5 DAYS  Final   Report Status 11/11/2014 FINAL  Final  Blood culture (routine x 2)     Status: None   Collection Time: 11/06/14  2:37 PM  Result Value Ref Range Status   Specimen Description BLOOD LEFT HAND  Final   Special Requests BOTTLES DRAWN AEROBIC AND ANAEROBIC 6CC  Final   Culture NO GROWTH 5 DAYS  Final   Report Status 11/11/2014 FINAL  Final  MRSA PCR Screening     Status:  None   Collection Time: 11/06/14  5:00 PM  Result Value Ref Range Status   MRSA by PCR NEGATIVE NEGATIVE Final    Comment:        The GeneXpert MRSA Assay (FDA approved for NASAL specimens only), is one component of a comprehensive MRSA colonization surveillance program. It is not intended to diagnose MRSA infection nor to guide or monitor treatment for MRSA infections.   Culture, Urine     Status: None   Collection Time: 11/08/14  7:40 PM  Result Value Ref Range Status   Specimen Description URINE, CLEAN CATCH  Final   Special Requests NONE  Final   Colony Count NO GROWTH Performed at [facility]   Final   Culture NO GROWTH Performed at [facility]   Final   Report Status 11/10/2014 FINAL  Final    Anti-infectives    Start     Dose/Rate Route Frequency Ordered Stop   11/13/14 1000  levofloxacin (LEVAQUIN) tablet 750 mg  Status:  Discontinued     750 mg Oral Every 48 hours 11/11/14 1337 11/11/14 1340   11/13/14 1000  levofloxacin (LEVAQUIN) tablet 500 mg     500 mg Oral Every 48 hours 11/11/14 1340     11/11/14 1345  levofloxacin (LEVAQUIN) tablet 250 mg  250 mg Oral  Once 11/11/14 1337     11/11/14 1000  levofloxacin (LEVAQUIN) tablet 500 mg  Status:  Discontinued     500 mg Oral Daily 11/11/14 0906 11/11/14 1337   11/07/14 1000  vancomycin (VANCOCIN) 1,250 mg in sodium chloride 0.9 % 250 mL IVPB  Status:  Discontinued     1,250 mg166.7 mL/hr over 90 Minutes Intravenous Every 24 hours 11/06/14 1749 11/11/14 0905   11/06/14 2200  piperacillin-tazobactam (ZOSYN) IVPB 3.375 g  Status:  Discontinued     3.375 g12.5 mL/hr over 240 Minutes Intravenous 3 times per day 11/06/14 1749 11/11/14 0905   11/06/14 1445  piperacillin-tazobactam (ZOSYN) IVPB 2.25 g     2.25 g66.7 mL/hr over 30 Minutes Intravenous  Once 11/06/14 1440 11/06/14 1550   11/06/14 1445  vancomycin (VANCOCIN) IVPB 1000 mg/200 mL premix     1,000 mg200 mL/hr over 60 Minutes Intravenous  Once  11/06/14 1440 11/06/14 1730     Assessment: 73 yo F admitted with healthcare associated pneumonia. She is improving. She has severe COPD. Pt currently being treated for HCAP.  Worsening renal function.  Vancomycin 12/25>>12/30 Zosyn 12/25>>12/30 Levaquin 12/30>>  Goal of Therapy:  Eradicate infection.   Plan:   Levaquin 750mg  PO x 1 then 500mg  every 48 hours.  Monitor renal function and cx data   Lovenox adjusted for renal dysfunction  Pricilla Pearson 11/11/2014,1:40 PM

## 2014-11-11 NOTE — Progress Notes (Signed)
Subjective: She is awake and alert and doing well. She has been getting out of bed. She is still coughing up sputum. Her breathing is better.  Objective: Vital signs in last 24 hours: Temp:  [98 F (36.7 C)-98.3 F (36.8 C)] 98 F (36.7 C) (12/30 0601) Pulse Rate:  [77-86] 77 (12/30 0601) Resp:  [20] 20 (12/30 0601) BP: (128-142)/(57-59) 140/59 mmHg (12/30 0601) SpO2:  [86 %-100 %] 96 % (12/30 0742) Weight:  [81.784 kg (180 lb 4.8 oz)] 81.784 kg (180 lb 4.8 oz) (12/30 0601) Weight change: -0.318 kg (-11.2 oz) Last BM Date: 11/10/14  Intake/Output from previous day: 12/29 0701 - 12/30 0700 In: 880 [P.O.:480; IV Piggyback:400] Out: 300 [Urine:300]  PHYSICAL EXAM General appearance: alert, cooperative and mild distress Resp: rhonchi bilaterally Cardio: regular rate and rhythm, S1, S2 normal, no murmur, click, rub or gallop GI: soft, non-tender; bowel sounds normal; no masses,  no organomegaly Extremities: extremities normal, atraumatic, no cyanosis or edema  Lab Results:  Results for orders placed or performed during the hospital encounter of 11/06/14 (from the past 48 hour(s))  Vitamin B12     Status: Abnormal   Collection Time: 11/09/14 11:25 AM  Result Value Ref Range   Vitamin B-12 1218 (H) 211 - 911 pg/mL    Comment: Performed at [facility]  Folate     Status: None   Collection Time: 11/09/14 11:25 AM  Result Value Ref Range   Folate 17.8 ng/mL    Comment: (NOTE) Reference Ranges        Deficient:       0.4 - 3.3 ng/mL        Indeterminate:   3.4 - 5.4 ng/mL        Normal:              > 5.4 ng/mL Performed at [facility]   Iron and TIBC     Status: Abnormal   Collection Time: 11/09/14 11:25 AM  Result Value Ref Range   Iron 18 (L) 42 - 145 ug/dL   TIBC 245 (L) 250 - 470 ug/dL   Saturation Ratios 7 (L) 20 - 55 %   UIBC 227 125 - 400 ug/dL    Comment: Performed at [facility]  Ferritin     Status: None   Collection Time: 11/09/14  11:25 AM  Result Value Ref Range   Ferritin 92 10 - 291 ng/mL    Comment: Performed at [place]     Status: Abnormal   Collection Time: 11/09/14 11:25 AM  Result Value Ref Range   Retic Ct Pct 2.0 0.4 - 3.1 %   RBC. 2.80 (L) 3.87 - 5.11 MIL/uL   Retic Count, Manual 56.0 19.0 - 186.0 K/uL  CBC with Differential     Status: Abnormal   Collection Time: 11/10/14  7:11 AM  Result Value Ref Range   WBC 11.4 (H) 4.0 - 10.5 K/uL   RBC 2.66 (L) 3.87 - 5.11 MIL/uL   Hemoglobin 8.2 (L) 12.0 - 15.0 g/dL   HCT 25.8 (L) 36.0 - 46.0 %   MCV 97.0 78.0 - 100.0 fL   MCH 30.8 26.0 - 34.0 pg   MCHC 31.8 30.0 - 36.0 g/dL   RDW 15.8 (H) 11.5 - 15.5 %   Platelets 259 150 - 400 K/uL   Neutrophils Relative % 92 (H) 43 - 77 %   Neutro Abs 10.5 (H) 1.7 - 7.7 K/uL   Lymphocytes  Relative 3 (L) 12 - 46 %   Lymphs Abs 0.4 (L) 0.7 - 4.0 K/uL   Monocytes Relative 5 3 - 12 %   Monocytes Absolute 0.6 0.1 - 1.0 K/uL   Eosinophils Relative 0 0 - 5 %   Eosinophils Absolute 0.0 0.0 - 0.7 K/uL   Basophils Relative 0 0 - 1 %   Basophils Absolute 0.0 0.0 - 0.1 K/uL    ABGS No results for input(s): PHART, PO2ART, TCO2, HCO3 in the last 72 hours.  Invalid input(s): PCO2 CULTURES Recent Results (from the past 240 hour(s))  Urine culture     Status: None   Collection Time: 11/06/14  2:10 PM  Result Value Ref Range Status   Specimen Description URINE, CATHETERIZED  Final   Special Requests NONE  Final   Colony Count NO GROWTH Performed at [facility]   Final   Culture NO GROWTH Performed at [facility]   Final   Report Status 11/07/2014 FINAL  Final  Blood culture (routine x 2)     Status: None (Preliminary result)   Collection Time: 11/06/14  2:30 PM  Result Value Ref Range Status   Specimen Description BLOOD LEFT ARM  Final   Special Requests BOTTLES DRAWN AEROBIC AND ANAEROBIC 6CC  Final   Culture NO GROWTH 4 DAYS  Final   Report Status PENDING  Incomplete   Blood culture (routine x 2)     Status: None (Preliminary result)   Collection Time: 11/06/14  2:37 PM  Result Value Ref Range Status   Specimen Description BLOOD LEFT HAND  Final   Special Requests BOTTLES DRAWN AEROBIC AND ANAEROBIC 6CC  Final   Culture NO GROWTH 4 DAYS  Final   Report Status PENDING  Incomplete  MRSA PCR Screening     Status: None   Collection Time: 11/06/14  5:00 PM  Result Value Ref Range Status   MRSA by PCR NEGATIVE NEGATIVE Final    Comment:        The GeneXpert MRSA Assay (FDA approved for NASAL specimens only), is one component of a comprehensive MRSA colonization surveillance program. It is not intended to diagnose MRSA infection nor to guide or monitor treatment for MRSA infections.   Culture, Urine     Status: None   Collection Time: 11/08/14  7:40 PM  Result Value Ref Range Status   Specimen Description URINE, CLEAN CATCH  Final   Special Requests NONE  Final   Colony Count NO GROWTH Performed at [facility]   Final   Culture NO GROWTH Performed at [facility]   Final   Report Status 11/10/2014 FINAL  Final   Studies/Results: No results found.  Medications:  Prior to Admission:  Prescriptions prior to admission  Medication Sig Dispense Refill Last Dose  . acetaminophen (TYLENOL) 325 MG tablet Take 325 mg by mouth 4 (four) times daily as needed for mild pain or moderate pain.    UNKNOWN  . albuterol (PROVENTIL) (2.5 MG/3ML) 0.083% nebulizer solution Take 2.5 mg by nebulization 3 (three) times daily.   11/06/2014 at Unknown time  . alendronate (FOSAMAX) 70 MG tablet Take 70 mg by mouth every Friday. Take with a full glass of water on an empty stomach.   10/30/2014  . ALPRAZolam (XANAX) 1 MG tablet Take 1 mg by mouth at bedtime.   11/05/2014 at Unknown time  . aspirin EC 81 MG tablet Take 81 mg by mouth daily.   11/06/2014 at  Unknown time  . budesonide-formoterol (SYMBICORT) 160-4.5 MCG/ACT inhaler Inhale 2 puffs into the  lungs 2 (two) times daily.   11/06/2014 at Unknown time  . calcium-vitamin D (OSCAL WITH D) 500-200 MG-UNIT per tablet Take 1 tablet by mouth 2 (two) times daily.   11/06/2014 at Unknown time  . cetirizine (ZYRTEC) 10 MG tablet Take 10 mg by mouth at bedtime.   11/05/2014 at Unknown time  . dextromethorphan-guaiFENesin (ROBITUSSIN-DM) 10-100 MG/5ML liquid Take 10 mLs by mouth every 4 (four) hours as needed for cough.   UNKNOWN  . diltiazem (TIAZAC) 240 MG 24 hr capsule Take 240 mg by mouth every morning.    11/06/2014 at Unknown time  . ferrous sulfate 325 (65 FE) MG tablet Take 325 mg by mouth daily.   11/06/2014 at Unknown time  . FLUoxetine (PROZAC) 20 MG tablet Take 20 mg by mouth every morning.    11/06/2014 at Unknown time  . fluticasone (FLONASE) 50 MCG/ACT nasal spray Place 2 sprays into the nose daily.   11/06/2014 at Unknown time  . furosemide (LASIX) 40 MG tablet Take 40 mg by mouth every morning.    11/06/2014 at Unknown time  . Multiple Vitamin (MULTIVITAMIN WITH MINERALS) TABS tablet Take 1 tablet by mouth daily.   11/06/2014 at Unknown time  . risperiDONE (RISPERDAL) 0.5 MG tablet Take 0.5 mg by mouth at bedtime.   11/05/2014 at Unknown time  . tiotropium (SPIRIVA) 18 MCG inhalation capsule Place 18 mcg into inhaler and inhale every morning.   11/06/2014 at Unknown time   Scheduled: . ALPRAZolam  1 mg Oral QHS  . antiseptic oral rinse  7 mL Mouth Rinse BID  . aspirin EC  81 mg Oral Daily  . budesonide-formoterol  2 puff Inhalation BID  . diltiazem  240 mg Oral Daily  . docusate sodium  100 mg Oral BID  . enoxaparin (LOVENOX) injection  40 mg Subcutaneous Q24H  . ferrous sulfate  325 mg Oral BID WC  . FLUoxetine  20 mg Oral Daily  . fluticasone  2 spray Each Nare Daily  . guaiFENesin  1,200 mg Oral BID  . ipratropium-albuterol  3 mL Nebulization Q6H  . levofloxacin  500 mg Oral Daily  . loratadine  10 mg Oral Daily  . pantoprazole  40 mg Oral Daily  . predniSONE  40 mg Oral  Q breakfast  . risperiDONE  0.5 mg Oral QHS   Continuous: . 0.9 % NaCl with KCl 20 mEq / L 10 mL/hr at 11/09/14 0900   VFI:EPPIRJ chloride, acetaminophen, albuterol, ondansetron (ZOFRAN) IV  Assesment: She was admitted with healthcare associated pneumonia. She is improving. She has severe COPD at baseline and that is unchanged. She is anemic and it looks like his iron deficiency based on her blood studies. She has had significant bleeding from her bladder which may be the source of her anemia stools are pending Principal Problem:   HCAP (healthcare-associated pneumonia) Active Problems:   COPD with acute exacerbation   Hypertension   Bipolar disease, chronic   Tachycardia   Bladder cancer   Sepsis   Acute renal insufficiency   Leukocytosis   Fever   Anemia    Plan: Switch her to oral medications. No other changes today. She will continue treatments and iron await stools and if her stools are positive for blood she will need GI workup but that can probably be accomplished as an outpatient. I anticipate she'll be discharged tomorrow    LOS: 5  days   Kalin Kyler L 11/11/2014, 9:06 AM

## 2014-11-12 ENCOUNTER — Inpatient Hospital Stay (HOSPITAL_COMMUNITY): Payer: Medicare Other

## 2014-11-12 LAB — BASIC METABOLIC PANEL
Anion gap: 5 (ref 5–15)
BUN: 56 mg/dL — AB (ref 6–23)
CO2: 28 mmol/L (ref 19–32)
Calcium: 9.1 mg/dL (ref 8.4–10.5)
Chloride: 110 mEq/L (ref 96–112)
Creatinine, Ser: 2.17 mg/dL — ABNORMAL HIGH (ref 0.50–1.10)
GFR calc Af Amer: 25 mL/min — ABNORMAL LOW (ref >90)
GFR, EST NON AFRICAN AMERICAN: 21 mL/min — AB (ref >90)
Glucose, Bld: 106 mg/dL — ABNORMAL HIGH (ref 70–99)
POTASSIUM: 3.4 mmol/L — AB (ref 3.5–5.1)
Sodium: 143 mmol/L (ref 135–145)

## 2014-11-12 MED ORDER — IPRATROPIUM-ALBUTEROL 0.5-2.5 (3) MG/3ML IN SOLN
3.0000 mL | Freq: Three times a day (TID) | RESPIRATORY_TRACT | Status: DC
  Administered 2014-11-12 – 2014-11-14 (×7): 3 mL via RESPIRATORY_TRACT
  Filled 2014-11-12 (×7): qty 3

## 2014-11-12 MED ORDER — FUROSEMIDE 40 MG PO TABS: 40.0000 mg | ORAL_TABLET | Freq: Every morning | ORAL | Status: AC

## 2014-11-12 MED ORDER — LEVOFLOXACIN 500 MG PO TABS: 500.0000 mg | ORAL_TABLET | ORAL | Status: DC

## 2014-11-12 NOTE — Progress Notes (Signed)
Subjective: She says she feels okay. She has no new complaints. Her basic metabolic profile which was pending at the time of my dictation of my note yesterday showed that her creatinine has significantly increased and was greater than 2. Her vancomycin has been stopped. Adjustments made in the dose of Levaquin. She is asymptomatic as far as her acute renal failure is concerned. Her basic metabolic profile has not been reported this morning because she is a difficult stick  Objective: Vital signs in last 24 hours: Temp:  [98 F (36.7 C)-98.2 F (36.8 C)] 98.2 F (36.8 C) (12/31 0617) Pulse Rate:  [80-86] 80 (12/31 0617) Resp:  [18-20] 18 (12/31 0617) BP: (123-154)/(59-85) 147/59 mmHg (12/31 0617) SpO2:  [95 %-99 %] 99 % (12/31 0724) Weight:  [82.4 kg (181 lb 10.5 oz)] 82.4 kg (181 lb 10.5 oz) (12/31 0617) Weight change: 0.616 kg (1 lb 5.7 oz) Last BM Date: 11/12/14  Intake/Output from previous day: 12/30 0701 - 12/31 0700 In: 560.8 [P.O.:480; I.V.:80.8] Out: 600 [Urine:600]  PHYSICAL EXAM General appearance: alert, cooperative and no distress Resp: clear to auscultation bilaterally Cardio: regular rate and rhythm, S1, S2 normal, no murmur, click, rub or gallop GI: soft, non-tender; bowel sounds normal; no masses,  no organomegaly Extremities: extremities normal, atraumatic, no cyanosis or edema  Lab Results:  Results for orders placed or performed during the hospital encounter of 11/06/14 (from the past 48 hour(s))  Vancomycin, trough     Status: Abnormal   Collection Time: 11/11/14  9:15 AM  Result Value Ref Range   Vancomycin Tr 34.8 (HH) 10.0 - 20.0 ug/mL    Comment: CRITICAL RESULT CALLED TO, READ BACK BY AND VERIFIED WITH: COVINGTON,L AT 10:30AM ON 11/11/14 BY FESTERMAN,C   Basic metabolic panel     Status: Abnormal   Collection Time: 11/11/14  9:26 AM  Result Value Ref Range   Sodium 143 135 - 145 mmol/L    Comment: Please note change in reference range.   Potassium  3.9 3.5 - 5.1 mmol/L    Comment: Please note change in reference range.   Chloride 108 96 - 112 mEq/L   CO2 28 19 - 32 mmol/L   Glucose, Bld 146 (H) 70 - 99 mg/dL   BUN 57 (H) 6 - 23 mg/dL   Creatinine, Ser 2.11 (H) 0.50 - 1.10 mg/dL   Calcium 8.8 8.4 - 10.5 mg/dL   GFR calc non Af Amer 22 (L) >90 mL/min   GFR calc Af Amer 26 (L) >90 mL/min    Comment: (NOTE) The eGFR has been calculated using the CKD EPI equation. This calculation has not been validated in all clinical situations. eGFR's persistently <90 mL/min signify possible Chronic Kidney Disease.    Anion gap 7 5 - 15    ABGS No results for input(s): PHART, PO2ART, TCO2, HCO3 in the last 72 hours.  Invalid input(s): PCO2 CULTURES Recent Results (from the past 240 hour(s))  Urine culture     Status: None   Collection Time: 11/06/14  2:10 PM  Result Value Ref Range Status   Specimen Description URINE, CATHETERIZED  Final   Special Requests NONE  Final   Colony Count NO GROWTH Performed at [facility]   Final   Culture NO GROWTH Performed at [facility]   Final   Report Status 11/07/2014 FINAL  Final  Blood culture (routine x 2)     Status: None   Collection Time: 11/06/14  2:30 PM  Result  Value Ref Range Status   Specimen Description BLOOD LEFT ARM  Final   Special Requests BOTTLES DRAWN AEROBIC AND ANAEROBIC 6CC  Final   Culture NO GROWTH 5 DAYS  Final   Report Status 11/11/2014 FINAL  Final  Blood culture (routine x 2)     Status: None   Collection Time: 11/06/14  2:37 PM  Result Value Ref Range Status   Specimen Description BLOOD LEFT HAND  Final   Special Requests BOTTLES DRAWN AEROBIC AND ANAEROBIC 6CC  Final   Culture NO GROWTH 5 DAYS  Final   Report Status 11/11/2014 FINAL  Final  MRSA PCR Screening     Status: None   Collection Time: 11/06/14  5:00 PM  Result Value Ref Range Status   MRSA by PCR NEGATIVE NEGATIVE Final    Comment:        The GeneXpert MRSA Assay (FDA approved for  NASAL specimens only), is one component of a comprehensive MRSA colonization surveillance program. It is not intended to diagnose MRSA infection nor to guide or monitor treatment for MRSA infections.   Culture, Urine     Status: None   Collection Time: 11/08/14  7:40 PM  Result Value Ref Range Status   Specimen Description URINE, CLEAN CATCH  Final   Special Requests NONE  Final   Colony Count NO GROWTH Performed at [facility]   Final   Culture NO GROWTH Performed at [facility]   Final   Report Status 11/10/2014 FINAL  Final   Studies/Results: No results found.  Medications:  Prior to Admission:  Prescriptions prior to admission  Medication Sig Dispense Refill Last Dose  . acetaminophen (TYLENOL) 325 MG tablet Take 325 mg by mouth 4 (four) times daily as needed for mild pain or moderate pain.    UNKNOWN  . albuterol (PROVENTIL) (2.5 MG/3ML) 0.083% nebulizer solution Take 2.5 mg by nebulization 3 (three) times daily.   11/06/2014 at Unknown time  . alendronate (FOSAMAX) 70 MG tablet Take 70 mg by mouth every Friday. Take with a full glass of water on an empty stomach.   10/30/2014  . ALPRAZolam (XANAX) 1 MG tablet Take 1 mg by mouth at bedtime.   11/05/2014 at Unknown time  . aspirin EC 81 MG tablet Take 81 mg by mouth daily.   11/06/2014 at Unknown time  . budesonide-formoterol (SYMBICORT) 160-4.5 MCG/ACT inhaler Inhale 2 puffs into the lungs 2 (two) times daily.   11/06/2014 at Unknown time  . calcium-vitamin D (OSCAL WITH D) 500-200 MG-UNIT per tablet Take 1 tablet by mouth 2 (two) times daily.   11/06/2014 at Unknown time  . cetirizine (ZYRTEC) 10 MG tablet Take 10 mg by mouth at bedtime.   11/05/2014 at Unknown time  . dextromethorphan-guaiFENesin (ROBITUSSIN-DM) 10-100 MG/5ML liquid Take 10 mLs by mouth every 4 (four) hours as needed for cough.   UNKNOWN  . diltiazem (TIAZAC) 240 MG 24 hr capsule Take 240 mg by mouth every morning.    11/06/2014 at  Unknown time  . ferrous sulfate 325 (65 FE) MG tablet Take 325 mg by mouth daily.   11/06/2014 at Unknown time  . FLUoxetine (PROZAC) 20 MG tablet Take 20 mg by mouth every morning.    11/06/2014 at Unknown time  . fluticasone (FLONASE) 50 MCG/ACT nasal spray Place 2 sprays into the nose daily.   11/06/2014 at Unknown time  . furosemide (LASIX) 40 MG tablet Take 40 mg by mouth every morning.  11/06/2014 at Unknown time  . Multiple Vitamin (MULTIVITAMIN WITH MINERALS) TABS tablet Take 1 tablet by mouth daily.   11/06/2014 at Unknown time  . risperiDONE (RISPERDAL) 0.5 MG tablet Take 0.5 mg by mouth at bedtime.   11/05/2014 at Unknown time  . tiotropium (SPIRIVA) 18 MCG inhalation capsule Place 18 mcg into inhaler and inhale every morning.   11/06/2014 at Unknown time   Scheduled: . ALPRAZolam  1 mg Oral QHS  . antiseptic oral rinse  7 mL Mouth Rinse BID  . aspirin EC  81 mg Oral Daily  . budesonide-formoterol  2 puff Inhalation BID  . diltiazem  240 mg Oral Daily  . docusate sodium  100 mg Oral BID  . enoxaparin (LOVENOX) injection  30 mg Subcutaneous Q24H  . ferrous sulfate  325 mg Oral BID WC  . FLUoxetine  20 mg Oral Daily  . fluticasone  2 spray Each Nare Daily  . guaiFENesin  1,200 mg Oral BID  . ipratropium-albuterol  3 mL Nebulization TID  . [START ON 11/13/2014] levofloxacin  500 mg Oral Q48H  . loratadine  10 mg Oral Daily  . pantoprazole  40 mg Oral Daily  . predniSONE  40 mg Oral Q breakfast  . risperiDONE  0.5 mg Oral QHS   Continuous: . 0.9 % NaCl with KCl 20 mEq / L 10 mL/hr at 11/09/14 0900   FRT:MYTRZN chloride, acetaminophen, albuterol, ondansetron (ZOFRAN) IV  Assesment: She was admitted with healthcare associated pneumonia. At baseline she has severe oxygen dependent COPD. She has acute renal insufficiency possibly related to vancomycin. Her basic metabolic profile is pending. She has chronic anemia which looks like it's iron deficiency perhaps related to her known  bladder cancer and chronic hematuria Principal Problem:   HCAP (healthcare-associated pneumonia) Active Problems:   COPD with acute exacerbation   Hypertension   Bipolar disease, chronic   Tachycardia   Bladder cancer   Sepsis   Acute renal insufficiency   Leukocytosis   Fever   Anemia    Plan: Await basic metabolic profile. If it is better she could still be discharged if not she will need renal ultrasound and will need to stay until her renal function improved somewhat    LOS: 6 days   Kynlei Piontek L 11/12/2014, 8:58 AM

## 2014-11-12 NOTE — Discharge Summary (Addendum)
Physician Discharge Summary  Patient ID: April Pearson MRN: [id number] DOB/AGE: 03/21/1941 73 y.o. Primary Care Physician:Buna Cuppett L, MD Admit date: 11/06/2014 Discharge date: 11/12/2014    Discharge Diagnoses:   Principal Problem:   HCAP (healthcare-associated pneumonia) Active Problems:   COPD with acute exacerbation   Hypertension   Bipolar disease, chronic   Tachycardia   Bladder cancer   Sepsis   Acute renal insufficiency   Leukocytosis   Fever   Anemia     Medication List    TAKE these medications        acetaminophen 325 MG tablet  Commonly known as:  TYLENOL  Take 325 mg by mouth 4 (four) times daily as needed for mild pain or moderate pain.     albuterol (2.5 MG/3ML) 0.083% nebulizer solution  Commonly known as:  PROVENTIL  Take 2.5 mg by nebulization 3 (three) times daily.     alendronate 70 MG tablet  Commonly known as:  FOSAMAX  Take 70 mg by mouth every Friday. Take with a full glass of water on an empty stomach.     ALPRAZolam 1 MG tablet  Commonly known as:  XANAX  Take 1 mg by mouth at bedtime.     aspirin EC 81 MG tablet  Take 81 mg by mouth daily.     budesonide-formoterol 160-4.5 MCG/ACT inhaler  Commonly known as:  SYMBICORT  Inhale 2 puffs into the lungs 2 (two) times daily.     calcium-vitamin D 500-200 MG-UNIT per tablet  Commonly known as:  OSCAL WITH D  Take 1 tablet by mouth 2 (two) times daily.     cetirizine 10 MG tablet  Commonly known as:  ZYRTEC  Take 10 mg by mouth at bedtime.     dextromethorphan-guaiFENesin 10-100 MG/5ML liquid  Commonly known as:  ROBITUSSIN-DM  Take 10 mLs by mouth every 4 (four) hours as needed for cough.     diltiazem 240 MG 24 hr capsule  Commonly known as:  TIAZAC  Take 240 mg by mouth every morning.     ferrous sulfate 325 (65 FE) MG tablet  Take 325 mg by mouth daily.     FLUoxetine 20 MG tablet  Commonly known as:  PROZAC  Take 20 mg by mouth every morning.     fluticasone 50 MCG/ACT nasal spray  Commonly known as:  FLONASE  Place 2 sprays into the nose daily.     furosemide 40 MG tablet  Commonly known as:  LASIX  Take 1 tablet (40 mg total) by mouth every morning. As needed for edema     levofloxacin 500 MG tablet  Commonly known as:  LEVAQUIN  Take 1 tablet (500 mg total) by mouth every other day.  Start taking on:  11/13/2014     multivitamin with minerals Tabs tablet  Take 1 tablet by mouth daily.     risperiDONE 0.5 MG tablet  Commonly known as:  RISPERDAL  Take 0.5 mg by mouth at bedtime.     tiotropium 18 MCG inhalation capsule  Commonly known as:  SPIRIVA  Place 18 mcg into inhaler and inhale every morning.        Discharged Condition: Improved    Consults: None  Significant Diagnostic Studies: Dg Chest 2 View  11/08/2014   CLINICAL DATA:  Expiatory wheezing  EXAM: CHEST  2 VIEW  COMPARISON:  11/06/2014  FINDINGS: Cardiomediastinal silhouette is stable. Hyperinflation again noted. Persistent bilateral basilar atelectasis or scarring. There is streaky airspace disease  in right upper lobe peripheral best seen on lateral view. This is highly suspicious for superimposed infiltrate/ pneumonia. Follow-up to resolution is recommended.  IMPRESSION: Persistent bilateral basilar atelectasis or scarring. There is streaky airspace disease in right upper lobe peripheral best seen on lateral view. This is highly suspicious for superimposed infiltrate/ pneumonia. Follow-up to resolution is recommended.   Electronically Signed   By: Lahoma Crocker M.D.   On: 11/08/2014 10:35   Dg Chest Portable 1 View  11/06/2014   CLINICAL DATA:  Shortness of breath, wheezing, fever and congestion for 1 day.  EXAM: PORTABLE CHEST - 1 VIEW  COMPARISON:  CT chest 12/29/2013 and single view of the chest 12/29/2013.  FINDINGS: The lungs are emphysematous but clear. Heart size is normal. No pneumothorax or pleural effusion.  IMPRESSION: Emphysema without acute  disease.   Electronically Signed   By: Inge Rise M.D.   On: 11/06/2014 14:39    Lab Results: Basic Metabolic Panel:  Recent Labs  11/11/14 0926  NA 143  K 3.9  CL 108  CO2 28  GLUCOSE 146*  BUN 57*  CREATININE 2.11*  CALCIUM 8.8   Liver Function Tests: No results for input(s): AST, ALT, ALKPHOS, BILITOT, PROT, ALBUMIN in the last 72 hours.   CBC:  Recent Labs  11/10/14 0711  WBC 11.4*  NEUTROABS 10.5*  HGB 8.2*  HCT 25.8*  MCV 97.0  PLT 259    Recent Results (from the past 240 hour(s))  Urine culture     Status: None   Collection Time: 11/06/14  2:10 PM  Result Value Ref Range Status   Specimen Description URINE, CATHETERIZED  Final   Special Requests NONE  Final   Colony Count NO GROWTH Performed at [facility]   Final   Culture NO GROWTH Performed at [facility]   Final   Report Status 11/07/2014 FINAL  Final  Blood culture (routine x 2)     Status: None   Collection Time: 11/06/14  2:30 PM  Result Value Ref Range Status   Specimen Description BLOOD LEFT ARM  Final   Special Requests BOTTLES DRAWN AEROBIC AND ANAEROBIC 6CC  Final   Culture NO GROWTH 5 DAYS  Final   Report Status 11/11/2014 FINAL  Final  Blood culture (routine x 2)     Status: None   Collection Time: 11/06/14  2:37 PM  Result Value Ref Range Status   Specimen Description BLOOD LEFT HAND  Final   Special Requests BOTTLES DRAWN AEROBIC AND ANAEROBIC 6CC  Final   Culture NO GROWTH 5 DAYS  Final   Report Status 11/11/2014 FINAL  Final  MRSA PCR Screening     Status: None   Collection Time: 11/06/14  5:00 PM  Result Value Ref Range Status   MRSA by PCR NEGATIVE NEGATIVE Final    Comment:        The GeneXpert MRSA Assay (FDA approved for NASAL specimens only), is one component of a comprehensive MRSA colonization surveillance program. It is not intended to diagnose MRSA infection nor to guide or monitor treatment for MRSA infections.   Culture, Urine      Status: None   Collection Time: 11/08/14  7:40 PM  Result Value Ref Range Status   Specimen Description URINE, CLEAN CATCH  Final   Special Requests NONE  Final   Colony Count NO GROWTH Performed at [facility]   Final   Culture NO GROWTH Performed at [facility]  Final   Report Status 11/10/2014 FINAL  Final     Hospital Course: This is a 73 year old with severe COPD. She had been in her usual state of health at but on the morning of admission she was unable to get out of bed. She said she kept falling over if she tried to get up. She eventually was brought to the emergency department where she was found to have pneumonia and was felt to be septic. She was started on intravenous antibiotics inhaled bronchodilators and was admitted to stepdown. She responded to treatment and was able to be transferred out of the intensive care unit after about 48 hours. She had elevation of her creatinine level probably from vancomycin. She is much improved as far as her chest is concerned. At the time of dictation repeat creatinine level is pending and if it's elevated her discharge will be canceled. Creatinine level was still elevated so she remained in the hospital until 11/14/2014. At the time of discharge her creatinine was 1.9 which is still elevated above baseline but better. She will have an outpatient basic metabolic profile.  Discharge Exam: Blood pressure 147/59, pulse 80, temperature 98.2 F (36.8 C), temperature source Oral, resp. rate 18, height 5\' 6"  (1.676 m), weight 82.4 kg (181 lb 10.5 oz), SpO2 99 %. She is awake and alert. She looks chronically sick. Her chest is clear. Heart is regular.  Disposition: Home with home health services      Discharge Instructions    Face-to-face encounter (required for Medicare/Medicaid patients)    Complete by:  As directed   I Ryson Bacha L certify that this patient is under my care and that I, or a nurse practitioner or physician's  assistant working with me, had a face-to-face encounter that meets the physician face-to-face encounter requirements with this patient on 11/12/2014. The encounter with the patient was in whole, or in part for the following medical condition(s) which is the primary reason for home health care (List medical condition): HCAP/COPD  The encounter with the patient was in whole, or in part, for the following medical condition, which is the primary reason for home health care:  HCAP/COPD  I certify that, based on my findings, the following services are medically necessary home health services:   NursingPhysical therapy    Reason for Medically Necessary Home Health Services:  Skilled Nursing- Change/Decline in Patient Status  My clinical findings support the need for the above services:  Shortness of breath with activity  Further, I certify that my clinical findings support that this patient is homebound due to:  Shortness of Breath with activity     Home Health    Complete by:  As directed   To provide the following care/treatments:   PTRN               Signed: Zalia Hautala L   11/12/2014, 9:07 AM

## 2014-11-13 LAB — BASIC METABOLIC PANEL
Anion gap: 4 — ABNORMAL LOW (ref 5–15)
BUN: 53 mg/dL — AB (ref 6–23)
CO2: 31 mmol/L (ref 19–32)
CREATININE: 1.88 mg/dL — AB (ref 0.50–1.10)
Calcium: 9 mg/dL (ref 8.4–10.5)
Chloride: 112 mEq/L (ref 96–112)
GFR, EST AFRICAN AMERICAN: 29 mL/min — AB (ref >90)
GFR, EST NON AFRICAN AMERICAN: 25 mL/min — AB (ref >90)
GLUCOSE: 101 mg/dL — AB (ref 70–99)
POTASSIUM: 3.6 mmol/L (ref 3.5–5.1)
Sodium: 147 mmol/L — ABNORMAL HIGH (ref 135–145)

## 2014-11-13 NOTE — Progress Notes (Signed)
Subjective: She says she feels okay but is still very weak. She has no other new complaints.  Objective: Vital signs in last 24 hours: Temp:  [98.2 F (36.8 C)-98.7 F (37.1 C)] 98.4 F (36.9 C) (01/01 0646) Pulse Rate:  [93-101] 93 (01/01 0646) Resp:  [17-18] 18 (01/01 0646) BP: (110-147)/(60-65) 147/63 mmHg (01/01 0646) SpO2:  [94 %-100 %] 95 % (01/01 0654) Weight:  [83.5 kg (184 lb 1.4 oz)] 83.5 kg (184 lb 1.4 oz) (01/01 0646) Weight change: 1.1 kg (2 lb 6.8 oz) Last BM Date: 11/12/14  Intake/Output from previous day: 12/31 0701 - 01/01 0700 In: 840 [P.O.:840] Out: 600 [Urine:600]  PHYSICAL EXAM General appearance: alert, cooperative and no distress Resp: rhonchi bilaterally Cardio: regular rate and rhythm, S1, S2 normal, no murmur, click, rub or gallop GI: soft, non-tender; bowel sounds normal; no masses,  no organomegaly Extremities: extremities normal, atraumatic, no cyanosis or edema  Lab Results:  Results for orders placed or performed during the hospital encounter of 11/06/14 (from the past 48 hour(s))  Basic metabolic panel     Status: Abnormal   Collection Time: 11/12/14  9:02 AM  Result Value Ref Range   Sodium 143 135 - 145 mmol/L    Comment: Please note change in reference range.   Potassium 3.4 (L) 3.5 - 5.1 mmol/L    Comment: Please note change in reference range.   Chloride 110 96 - 112 mEq/L   CO2 28 19 - 32 mmol/L   Glucose, Bld 106 (H) 70 - 99 mg/dL   BUN 56 (H) 6 - 23 mg/dL   Creatinine, Ser 2.17 (H) 0.50 - 1.10 mg/dL   Calcium 9.1 8.4 - 10.5 mg/dL   GFR calc non Af Amer 21 (L) >90 mL/min   GFR calc Af Amer 25 (L) >90 mL/min    Comment: (NOTE) The eGFR has been calculated using the CKD EPI equation. This calculation has not been validated in all clinical situations. eGFR's persistently <90 mL/min signify possible Chronic Kidney Disease.    Anion gap 5 5 - 15  Basic metabolic panel     Status: Abnormal   Collection Time: 11/13/14  6:40 AM   Result Value Ref Range   Sodium 147 (H) 135 - 145 mmol/L    Comment: Please note change in reference range.   Potassium 3.6 3.5 - 5.1 mmol/L    Comment: Please note change in reference range.   Chloride 112 96 - 112 mEq/L   CO2 31 19 - 32 mmol/L   Glucose, Bld 101 (H) 70 - 99 mg/dL   BUN 53 (H) 6 - 23 mg/dL   Creatinine, Ser 1.88 (H) 0.50 - 1.10 mg/dL   Calcium 9.0 8.4 - 10.5 mg/dL   GFR calc non Af Amer 25 (L) >90 mL/min   GFR calc Af Amer 29 (L) >90 mL/min    Comment: (NOTE) The eGFR has been calculated using the CKD EPI equation. This calculation has not been validated in all clinical situations. eGFR's persistently <90 mL/min signify possible Chronic Kidney Disease.    Anion gap 4 (L) 5 - 15    ABGS No results for input(s): PHART, PO2ART, TCO2, HCO3 in the last 72 hours.  Invalid input(s): PCO2 CULTURES Recent Results (from the past 240 hour(s))  Urine culture     Status: None   Collection Time: 11/06/14  2:10 PM  Result Value Ref Range Status   Specimen Description URINE, CATHETERIZED  Final   Special Requests NONE  Final   Colony Count NO GROWTH Performed at [facility]   Final   Culture NO GROWTH Performed at [facility]   Final   Report Status 11/07/2014 FINAL  Final  Blood culture (routine x 2)     Status: None   Collection Time: 11/06/14  2:30 PM  Result Value Ref Range Status   Specimen Description BLOOD LEFT ARM  Final   Special Requests BOTTLES DRAWN AEROBIC AND ANAEROBIC 6CC  Final   Culture NO GROWTH 5 DAYS  Final   Report Status 11/11/2014 FINAL  Final  Blood culture (routine x 2)     Status: None   Collection Time: 11/06/14  2:37 PM  Result Value Ref Range Status   Specimen Description BLOOD LEFT HAND  Final   Special Requests BOTTLES DRAWN AEROBIC AND ANAEROBIC 6CC  Final   Culture NO GROWTH 5 DAYS  Final   Report Status 11/11/2014 FINAL  Final  MRSA PCR Screening     Status: None   Collection Time: 11/06/14  5:00 PM   Result Value Ref Range Status   MRSA by PCR NEGATIVE NEGATIVE Final    Comment:        The GeneXpert MRSA Assay (FDA approved for NASAL specimens only), is one component of a comprehensive MRSA colonization surveillance program. It is not intended to diagnose MRSA infection nor to guide or monitor treatment for MRSA infections.   Culture, Urine     Status: None   Collection Time: 11/08/14  7:40 PM  Result Value Ref Range Status   Specimen Description URINE, CLEAN CATCH  Final   Special Requests NONE  Final   Colony Count NO GROWTH Performed at [facility]   Final   Culture NO GROWTH Performed at [facility]   Final   Report Status 11/10/2014 FINAL  Final   Studies/Results: US Renal  11/12/2014   CLINICAL DATA:  Acute renal injury.  EXAM: RENAL/URINARY TRACT ULTRASOUND COMPLETE  COMPARISON:  CT 08/20/2012.  FINDINGS: Right Kidney:  Length: 11.6 cm. Echogenicity within normal limits. No mass or hydronephrosis visualized.  Left Kidney:  Length: 10.3 cm. Echogenicity within normal limits. No mass or hydronephrosis visualized.  Bladder:  Appears normal for degree of bladder distention.  IMPRESSION: Normal exam.   Electronically Signed   By: Marcello Moores  Register   On: 11/12/2014 13:58    Medications:  Prior to Admission:  Prescriptions prior to admission  Medication Sig Dispense Refill Last Dose  . acetaminophen (TYLENOL) 325 MG tablet Take 325 mg by mouth 4 (four) times daily as needed for mild pain or moderate pain.    UNKNOWN  . albuterol (PROVENTIL) (2.5 MG/3ML) 0.083% nebulizer solution Take 2.5 mg by nebulization 3 (three) times daily.   11/06/2014 at Unknown time  . alendronate (FOSAMAX) 70 MG tablet Take 70 mg by mouth every Friday. Take with a full glass of water on an empty stomach.   10/30/2014  . ALPRAZolam (XANAX) 1 MG tablet Take 1 mg by mouth at bedtime.   11/05/2014 at Unknown time  . aspirin EC 81 MG tablet Take 81 mg by mouth daily.   11/06/2014 at  Unknown time  . budesonide-formoterol (SYMBICORT) 160-4.5 MCG/ACT inhaler Inhale 2 puffs into the lungs 2 (two) times daily.   11/06/2014 at Unknown time  . calcium-vitamin D (OSCAL WITH D) 500-200 MG-UNIT per tablet Take 1 tablet by mouth 2 (two) times daily.   11/06/2014 at Unknown time  . cetirizine (  ZYRTEC) 10 MG tablet Take 10 mg by mouth at bedtime.   11/05/2014 at Unknown time  . dextromethorphan-guaiFENesin (ROBITUSSIN-DM) 10-100 MG/5ML liquid Take 10 mLs by mouth every 4 (four) hours as needed for cough.   UNKNOWN  . diltiazem (TIAZAC) 240 MG 24 hr capsule Take 240 mg by mouth every morning.    11/06/2014 at Unknown time  . ferrous sulfate 325 (65 FE) MG tablet Take 325 mg by mouth daily.   11/06/2014 at Unknown time  . FLUoxetine (PROZAC) 20 MG tablet Take 20 mg by mouth every morning.    11/06/2014 at Unknown time  . fluticasone (FLONASE) 50 MCG/ACT nasal spray Place 2 sprays into the nose daily.   11/06/2014 at Unknown time  . Multiple Vitamin (MULTIVITAMIN WITH MINERALS) TABS tablet Take 1 tablet by mouth daily.   11/06/2014 at Unknown time  . risperiDONE (RISPERDAL) 0.5 MG tablet Take 0.5 mg by mouth at bedtime.   11/05/2014 at Unknown time  . tiotropium (SPIRIVA) 18 MCG inhalation capsule Place 18 mcg into inhaler and inhale every morning.   11/06/2014 at Unknown time  . [DISCONTINUED] furosemide (LASIX) 40 MG tablet Take 40 mg by mouth every morning.    11/06/2014 at Unknown time   Scheduled: . ALPRAZolam  1 mg Oral QHS  . antiseptic oral rinse  7 mL Mouth Rinse BID  . aspirin EC  81 mg Oral Daily  . budesonide-formoterol  2 puff Inhalation BID  . diltiazem  240 mg Oral Daily  . docusate sodium  100 mg Oral BID  . enoxaparin (LOVENOX) injection  30 mg Subcutaneous Q24H  . ferrous sulfate  325 mg Oral BID WC  . FLUoxetine  20 mg Oral Daily  . fluticasone  2 spray Each Nare Daily  . guaiFENesin  1,200 mg Oral BID  . ipratropium-albuterol  3 mL Nebulization TID  . levofloxacin   500 mg Oral Q48H  . loratadine  10 mg Oral Daily  . pantoprazole  40 mg Oral Daily  . predniSONE  40 mg Oral Q breakfast  . risperiDONE  0.5 mg Oral QHS   Continuous: . 0.9 % NaCl with KCl 20 mEq / L 10 mL/hr (11/13/14 0944)   EYC:XKGYJE chloride, acetaminophen, albuterol, ondansetron (ZOFRAN) IV  Assesment: She was admitted with healthcare associated pneumonia and sepsis. She has improved. She developed kidney toxicity from vancomycin which is improving. Her renal ultrasound did not show any obstruction. She is still very weak. She is very sleepy. Principal Problem:   HCAP (healthcare-associated pneumonia) Active Problems:   COPD with acute exacerbation   Hypertension   Bipolar disease, chronic   Tachycardia   Bladder cancer   Sepsis   Acute renal insufficiency   Leukocytosis   Fever   Anemia    Plan: Continue treatments recheck her renal function tomorrow and anticipate she'll go home tomorrow    LOS: 7 days   Gorge Almanza L 11/13/2014, 12:14 PM

## 2014-11-13 NOTE — Clinical Social Work Note (Signed)
CSW spoke with staff at [facility] and updated them on pt. Anticipate d/c tomorrow per MD. Facility aware and agreeable.   Benay Pike, [place]

## 2014-11-13 NOTE — Plan of Care (Signed)
Problem: Phase III Progression Outcomes Goal: Activity at appropriate level-compared to baseline (UP IN CHAIR FOR HEMODIALYSIS)  Outcome: Progressing Plan to get patient out of bed. Goal: Voiding independently Outcome: Progressing OOB to Chair with assistance

## 2014-11-14 LAB — BASIC METABOLIC PANEL
Anion gap: 5 (ref 5–15)
BUN: 47 mg/dL — AB (ref 6–23)
CO2: 27 mmol/L (ref 19–32)
CREATININE: 1.9 mg/dL — AB (ref 0.50–1.10)
Calcium: 9.1 mg/dL (ref 8.4–10.5)
Chloride: 112 mEq/L (ref 96–112)
GFR calc non Af Amer: 25 mL/min — ABNORMAL LOW (ref >90)
GFR, EST AFRICAN AMERICAN: 29 mL/min — AB (ref >90)
GLUCOSE: 88 mg/dL (ref 70–99)
POTASSIUM: 3.8 mmol/L (ref 3.5–5.1)
Sodium: 144 mmol/L (ref 135–145)

## 2014-11-14 MED ORDER — PREDNISONE 10 MG PO TABS: 10.0000 mg | ORAL_TABLET | Freq: Every day | ORAL | Status: DC

## 2014-11-14 NOTE — Progress Notes (Signed)
Patient discharged with instructions, prescription, and care notes.  Verbalized understanding via teach back.  IV was removed and the site was WNL. Patient voiced no further complaints or concerns at the time of discharge.  Appointments scheduled per instructions.  Patient left the floor via w/c with staff and family in stable condition. Packet was taken to the patients home at discharge.

## 2014-11-14 NOTE — Progress Notes (Signed)
Her renal function has stabilized but is not back to baseline yet. However I think she can go home and have repeat basic metabolic profile as an outpatient

## 2014-11-15 DIAGNOSIS — M810 Age-related osteoporosis without current pathological fracture: Secondary | ICD-10-CM | POA: Diagnosis not present

## 2014-11-15 DIAGNOSIS — J189 Pneumonia, unspecified organism: Secondary | ICD-10-CM | POA: Diagnosis not present

## 2014-11-15 DIAGNOSIS — F319 Bipolar disorder, unspecified: Secondary | ICD-10-CM | POA: Diagnosis not present

## 2014-11-15 DIAGNOSIS — C679 Malignant neoplasm of bladder, unspecified: Secondary | ICD-10-CM | POA: Diagnosis not present

## 2014-11-15 DIAGNOSIS — J441 Chronic obstructive pulmonary disease with (acute) exacerbation: Secondary | ICD-10-CM | POA: Diagnosis not present

## 2014-11-15 DIAGNOSIS — I10 Essential (primary) hypertension: Secondary | ICD-10-CM | POA: Diagnosis not present

## 2014-11-15 DIAGNOSIS — Z9981 Dependence on supplemental oxygen: Secondary | ICD-10-CM | POA: Diagnosis not present

## 2014-11-16 DIAGNOSIS — F319 Bipolar disorder, unspecified: Secondary | ICD-10-CM | POA: Diagnosis not present

## 2014-11-16 DIAGNOSIS — C679 Malignant neoplasm of bladder, unspecified: Secondary | ICD-10-CM | POA: Diagnosis not present

## 2014-11-16 DIAGNOSIS — J189 Pneumonia, unspecified organism: Secondary | ICD-10-CM | POA: Diagnosis not present

## 2014-11-16 DIAGNOSIS — I10 Essential (primary) hypertension: Secondary | ICD-10-CM | POA: Diagnosis not present

## 2014-11-16 DIAGNOSIS — J441 Chronic obstructive pulmonary disease with (acute) exacerbation: Secondary | ICD-10-CM | POA: Diagnosis not present

## 2014-11-16 DIAGNOSIS — Z9981 Dependence on supplemental oxygen: Secondary | ICD-10-CM | POA: Diagnosis not present

## 2014-11-16 DIAGNOSIS — M810 Age-related osteoporosis without current pathological fracture: Secondary | ICD-10-CM | POA: Diagnosis not present

## 2014-11-17 NOTE — Care Management Utilization Note (Signed)
UR completed 

## 2014-11-19 DIAGNOSIS — J189 Pneumonia, unspecified organism: Secondary | ICD-10-CM | POA: Diagnosis not present

## 2014-11-19 DIAGNOSIS — F319 Bipolar disorder, unspecified: Secondary | ICD-10-CM | POA: Diagnosis not present

## 2014-11-19 DIAGNOSIS — J441 Chronic obstructive pulmonary disease with (acute) exacerbation: Secondary | ICD-10-CM | POA: Diagnosis not present

## 2014-11-19 DIAGNOSIS — M810 Age-related osteoporosis without current pathological fracture: Secondary | ICD-10-CM | POA: Diagnosis not present

## 2014-11-19 DIAGNOSIS — C679 Malignant neoplasm of bladder, unspecified: Secondary | ICD-10-CM | POA: Diagnosis not present

## 2014-11-19 DIAGNOSIS — I10 Essential (primary) hypertension: Secondary | ICD-10-CM | POA: Diagnosis not present

## 2014-11-19 DIAGNOSIS — Z9981 Dependence on supplemental oxygen: Secondary | ICD-10-CM | POA: Diagnosis not present

## 2014-11-24 DIAGNOSIS — I10 Essential (primary) hypertension: Secondary | ICD-10-CM | POA: Diagnosis not present

## 2014-11-24 DIAGNOSIS — J189 Pneumonia, unspecified organism: Secondary | ICD-10-CM | POA: Diagnosis not present

## 2014-11-24 DIAGNOSIS — C679 Malignant neoplasm of bladder, unspecified: Secondary | ICD-10-CM | POA: Diagnosis not present

## 2014-11-24 DIAGNOSIS — J441 Chronic obstructive pulmonary disease with (acute) exacerbation: Secondary | ICD-10-CM | POA: Diagnosis not present

## 2014-11-24 DIAGNOSIS — M810 Age-related osteoporosis without current pathological fracture: Secondary | ICD-10-CM | POA: Diagnosis not present

## 2014-11-24 DIAGNOSIS — F319 Bipolar disorder, unspecified: Secondary | ICD-10-CM | POA: Diagnosis not present

## 2014-11-24 DIAGNOSIS — Z9981 Dependence on supplemental oxygen: Secondary | ICD-10-CM | POA: Diagnosis not present

## 2014-11-26 DIAGNOSIS — J189 Pneumonia, unspecified organism: Secondary | ICD-10-CM | POA: Diagnosis not present

## 2014-11-26 DIAGNOSIS — J441 Chronic obstructive pulmonary disease with (acute) exacerbation: Secondary | ICD-10-CM | POA: Diagnosis not present

## 2014-11-26 DIAGNOSIS — F319 Bipolar disorder, unspecified: Secondary | ICD-10-CM | POA: Diagnosis not present

## 2014-11-26 DIAGNOSIS — C679 Malignant neoplasm of bladder, unspecified: Secondary | ICD-10-CM | POA: Diagnosis not present

## 2014-11-26 DIAGNOSIS — M810 Age-related osteoporosis without current pathological fracture: Secondary | ICD-10-CM | POA: Diagnosis not present

## 2014-11-26 DIAGNOSIS — Z9981 Dependence on supplemental oxygen: Secondary | ICD-10-CM | POA: Diagnosis not present

## 2014-11-26 DIAGNOSIS — I10 Essential (primary) hypertension: Secondary | ICD-10-CM | POA: Diagnosis not present

## 2014-11-27 DIAGNOSIS — J189 Pneumonia, unspecified organism: Secondary | ICD-10-CM | POA: Diagnosis not present

## 2014-11-27 DIAGNOSIS — F319 Bipolar disorder, unspecified: Secondary | ICD-10-CM | POA: Diagnosis not present

## 2014-11-27 DIAGNOSIS — C679 Malignant neoplasm of bladder, unspecified: Secondary | ICD-10-CM | POA: Diagnosis not present

## 2014-11-27 DIAGNOSIS — J441 Chronic obstructive pulmonary disease with (acute) exacerbation: Secondary | ICD-10-CM | POA: Diagnosis not present

## 2014-11-27 DIAGNOSIS — M810 Age-related osteoporosis without current pathological fracture: Secondary | ICD-10-CM | POA: Diagnosis not present

## 2014-11-27 DIAGNOSIS — I10 Essential (primary) hypertension: Secondary | ICD-10-CM | POA: Diagnosis not present

## 2014-11-27 DIAGNOSIS — Z9981 Dependence on supplemental oxygen: Secondary | ICD-10-CM | POA: Diagnosis not present

## 2014-11-30 DIAGNOSIS — F319 Bipolar disorder, unspecified: Secondary | ICD-10-CM | POA: Diagnosis not present

## 2014-11-30 DIAGNOSIS — M810 Age-related osteoporosis without current pathological fracture: Secondary | ICD-10-CM | POA: Diagnosis not present

## 2014-11-30 DIAGNOSIS — J189 Pneumonia, unspecified organism: Secondary | ICD-10-CM | POA: Diagnosis not present

## 2014-11-30 DIAGNOSIS — Z9981 Dependence on supplemental oxygen: Secondary | ICD-10-CM | POA: Diagnosis not present

## 2014-11-30 DIAGNOSIS — I10 Essential (primary) hypertension: Secondary | ICD-10-CM | POA: Diagnosis not present

## 2014-11-30 DIAGNOSIS — C679 Malignant neoplasm of bladder, unspecified: Secondary | ICD-10-CM | POA: Diagnosis not present

## 2014-11-30 DIAGNOSIS — J441 Chronic obstructive pulmonary disease with (acute) exacerbation: Secondary | ICD-10-CM | POA: Diagnosis not present

## 2014-12-02 DIAGNOSIS — J441 Chronic obstructive pulmonary disease with (acute) exacerbation: Secondary | ICD-10-CM | POA: Diagnosis not present

## 2014-12-02 DIAGNOSIS — C679 Malignant neoplasm of bladder, unspecified: Secondary | ICD-10-CM | POA: Diagnosis not present

## 2014-12-02 DIAGNOSIS — M810 Age-related osteoporosis without current pathological fracture: Secondary | ICD-10-CM | POA: Diagnosis not present

## 2014-12-02 DIAGNOSIS — I10 Essential (primary) hypertension: Secondary | ICD-10-CM | POA: Diagnosis not present

## 2014-12-02 DIAGNOSIS — F319 Bipolar disorder, unspecified: Secondary | ICD-10-CM | POA: Diagnosis not present

## 2014-12-02 DIAGNOSIS — J189 Pneumonia, unspecified organism: Secondary | ICD-10-CM | POA: Diagnosis not present

## 2014-12-02 DIAGNOSIS — Z9981 Dependence on supplemental oxygen: Secondary | ICD-10-CM | POA: Diagnosis not present

## 2014-12-07 DIAGNOSIS — J189 Pneumonia, unspecified organism: Secondary | ICD-10-CM | POA: Diagnosis not present

## 2014-12-07 DIAGNOSIS — I10 Essential (primary) hypertension: Secondary | ICD-10-CM | POA: Diagnosis not present

## 2014-12-07 DIAGNOSIS — J441 Chronic obstructive pulmonary disease with (acute) exacerbation: Secondary | ICD-10-CM | POA: Diagnosis not present

## 2014-12-07 DIAGNOSIS — Z9981 Dependence on supplemental oxygen: Secondary | ICD-10-CM | POA: Diagnosis not present

## 2014-12-07 DIAGNOSIS — C679 Malignant neoplasm of bladder, unspecified: Secondary | ICD-10-CM | POA: Diagnosis not present

## 2014-12-07 DIAGNOSIS — M810 Age-related osteoporosis without current pathological fracture: Secondary | ICD-10-CM | POA: Diagnosis not present

## 2014-12-07 DIAGNOSIS — F319 Bipolar disorder, unspecified: Secondary | ICD-10-CM | POA: Diagnosis not present

## 2014-12-09 DIAGNOSIS — J449 Chronic obstructive pulmonary disease, unspecified: Secondary | ICD-10-CM | POA: Diagnosis not present

## 2014-12-10 DIAGNOSIS — J189 Pneumonia, unspecified organism: Secondary | ICD-10-CM | POA: Diagnosis not present

## 2014-12-10 DIAGNOSIS — C679 Malignant neoplasm of bladder, unspecified: Secondary | ICD-10-CM | POA: Diagnosis not present

## 2014-12-10 DIAGNOSIS — I10 Essential (primary) hypertension: Secondary | ICD-10-CM | POA: Diagnosis not present

## 2014-12-10 DIAGNOSIS — Z9981 Dependence on supplemental oxygen: Secondary | ICD-10-CM | POA: Diagnosis not present

## 2014-12-10 DIAGNOSIS — J441 Chronic obstructive pulmonary disease with (acute) exacerbation: Secondary | ICD-10-CM | POA: Diagnosis not present

## 2014-12-10 DIAGNOSIS — M810 Age-related osteoporosis without current pathological fracture: Secondary | ICD-10-CM | POA: Diagnosis not present

## 2014-12-10 DIAGNOSIS — F319 Bipolar disorder, unspecified: Secondary | ICD-10-CM | POA: Diagnosis not present

## 2014-12-11 DIAGNOSIS — J189 Pneumonia, unspecified organism: Secondary | ICD-10-CM | POA: Diagnosis not present

## 2014-12-11 DIAGNOSIS — M810 Age-related osteoporosis without current pathological fracture: Secondary | ICD-10-CM | POA: Diagnosis not present

## 2014-12-11 DIAGNOSIS — J441 Chronic obstructive pulmonary disease with (acute) exacerbation: Secondary | ICD-10-CM | POA: Diagnosis not present

## 2014-12-11 DIAGNOSIS — Z9981 Dependence on supplemental oxygen: Secondary | ICD-10-CM | POA: Diagnosis not present

## 2014-12-11 DIAGNOSIS — C679 Malignant neoplasm of bladder, unspecified: Secondary | ICD-10-CM | POA: Diagnosis not present

## 2014-12-11 DIAGNOSIS — F319 Bipolar disorder, unspecified: Secondary | ICD-10-CM | POA: Diagnosis not present

## 2014-12-11 DIAGNOSIS — I10 Essential (primary) hypertension: Secondary | ICD-10-CM | POA: Diagnosis not present

## 2014-12-18 ENCOUNTER — Ambulatory Visit (INDEPENDENT_AMBULATORY_CARE_PROVIDER_SITE_OTHER): Payer: Medicare Other | Admitting: Urology

## 2014-12-18 DIAGNOSIS — Z8551 Personal history of malignant neoplasm of bladder: Secondary | ICD-10-CM

## 2014-12-18 DIAGNOSIS — C675 Malignant neoplasm of bladder neck: Secondary | ICD-10-CM | POA: Diagnosis not present

## 2014-12-18 DIAGNOSIS — D090 Carcinoma in situ of bladder: Secondary | ICD-10-CM | POA: Diagnosis not present

## 2015-01-09 DIAGNOSIS — J449 Chronic obstructive pulmonary disease, unspecified: Secondary | ICD-10-CM | POA: Diagnosis not present

## 2015-01-11 DIAGNOSIS — C679 Malignant neoplasm of bladder, unspecified: Secondary | ICD-10-CM | POA: Diagnosis not present

## 2015-01-11 DIAGNOSIS — J200 Acute bronchitis due to Mycoplasma pneumoniae: Secondary | ICD-10-CM | POA: Diagnosis not present

## 2015-01-11 DIAGNOSIS — J449 Chronic obstructive pulmonary disease, unspecified: Secondary | ICD-10-CM | POA: Diagnosis not present

## 2015-01-11 DIAGNOSIS — I10 Essential (primary) hypertension: Secondary | ICD-10-CM | POA: Diagnosis not present

## 2015-01-14 DIAGNOSIS — J40 Bronchitis, not specified as acute or chronic: Secondary | ICD-10-CM | POA: Diagnosis not present

## 2015-01-14 DIAGNOSIS — J449 Chronic obstructive pulmonary disease, unspecified: Secondary | ICD-10-CM | POA: Diagnosis not present

## 2015-01-14 DIAGNOSIS — I10 Essential (primary) hypertension: Secondary | ICD-10-CM | POA: Diagnosis not present

## 2015-01-18 DIAGNOSIS — C679 Malignant neoplasm of bladder, unspecified: Secondary | ICD-10-CM | POA: Diagnosis not present

## 2015-01-18 DIAGNOSIS — F319 Bipolar disorder, unspecified: Secondary | ICD-10-CM | POA: Diagnosis not present

## 2015-01-18 DIAGNOSIS — J449 Chronic obstructive pulmonary disease, unspecified: Secondary | ICD-10-CM | POA: Diagnosis not present

## 2015-01-18 DIAGNOSIS — I10 Essential (primary) hypertension: Secondary | ICD-10-CM | POA: Diagnosis not present

## 2015-01-25 DIAGNOSIS — R0602 Shortness of breath: Secondary | ICD-10-CM | POA: Diagnosis not present

## 2015-01-27 DIAGNOSIS — Z23 Encounter for immunization: Secondary | ICD-10-CM | POA: Diagnosis not present

## 2015-02-07 DIAGNOSIS — J449 Chronic obstructive pulmonary disease, unspecified: Secondary | ICD-10-CM | POA: Diagnosis not present

## 2015-02-11 ENCOUNTER — Emergency Department (HOSPITAL_COMMUNITY)
Admission: EM | Admit: 2015-02-11 | Discharge: 2015-02-11 | Disposition: A | Discharge status: Home / Self Care | Payer: Medicare Other | Attending: Emergency Medicine | Admitting: Emergency Medicine

## 2015-02-11 ENCOUNTER — Emergency Department (HOSPITAL_COMMUNITY): Payer: Medicare Other

## 2015-02-11 ENCOUNTER — Encounter (HOSPITAL_COMMUNITY): Payer: Self-pay | Admitting: *Deleted

## 2015-02-11 DIAGNOSIS — R0682 Tachypnea, not elsewhere classified: Secondary | ICD-10-CM | POA: Diagnosis not present

## 2015-02-11 DIAGNOSIS — Z8619 Personal history of other infectious and parasitic diseases: Secondary | ICD-10-CM | POA: Diagnosis not present

## 2015-02-11 DIAGNOSIS — Z8551 Personal history of malignant neoplasm of bladder: Secondary | ICD-10-CM | POA: Diagnosis not present

## 2015-02-11 DIAGNOSIS — M1990 Unspecified osteoarthritis, unspecified site: Secondary | ICD-10-CM | POA: Insufficient documentation

## 2015-02-11 DIAGNOSIS — Z7951 Long term (current) use of inhaled steroids: Secondary | ICD-10-CM | POA: Insufficient documentation

## 2015-02-11 DIAGNOSIS — J441 Chronic obstructive pulmonary disease with (acute) exacerbation: Secondary | ICD-10-CM | POA: Diagnosis not present

## 2015-02-11 DIAGNOSIS — Z87891 Personal history of nicotine dependence: Secondary | ICD-10-CM | POA: Diagnosis not present

## 2015-02-11 DIAGNOSIS — M791 Myalgia: Secondary | ICD-10-CM | POA: Diagnosis not present

## 2015-02-11 DIAGNOSIS — Z7982 Long term (current) use of aspirin: Secondary | ICD-10-CM | POA: Diagnosis not present

## 2015-02-11 DIAGNOSIS — Z8701 Personal history of pneumonia (recurrent): Secondary | ICD-10-CM | POA: Insufficient documentation

## 2015-02-11 DIAGNOSIS — F419 Anxiety disorder, unspecified: Secondary | ICD-10-CM | POA: Diagnosis not present

## 2015-02-11 DIAGNOSIS — Z8669 Personal history of other diseases of the nervous system and sense organs: Secondary | ICD-10-CM | POA: Insufficient documentation

## 2015-02-11 DIAGNOSIS — Z8719 Personal history of other diseases of the digestive system: Secondary | ICD-10-CM | POA: Insufficient documentation

## 2015-02-11 DIAGNOSIS — R42 Dizziness and giddiness: Secondary | ICD-10-CM | POA: Diagnosis not present

## 2015-02-11 DIAGNOSIS — F329 Major depressive disorder, single episode, unspecified: Secondary | ICD-10-CM | POA: Diagnosis not present

## 2015-02-11 DIAGNOSIS — R05 Cough: Secondary | ICD-10-CM | POA: Diagnosis not present

## 2015-02-11 DIAGNOSIS — R0602 Shortness of breath: Secondary | ICD-10-CM | POA: Diagnosis not present

## 2015-02-11 LAB — CBC WITH DIFFERENTIAL/PLATELET
BASOS PCT: 0 % (ref 0–1)
Basophils Absolute: 0 10*3/uL (ref 0.0–0.1)
Eosinophils Absolute: 0.2 10*3/uL (ref 0.0–0.7)
Eosinophils Relative: 3 % (ref 0–5)
HCT: 27.4 % — ABNORMAL LOW (ref 36.0–46.0)
HEMOGLOBIN: 8.3 g/dL — AB (ref 12.0–15.0)
Lymphocytes Relative: 9 % — ABNORMAL LOW (ref 12–46)
Lymphs Abs: 0.7 10*3/uL (ref 0.7–4.0)
MCH: 28.5 pg (ref 26.0–34.0)
MCHC: 30.3 g/dL (ref 30.0–36.0)
MCV: 94.2 fL (ref 78.0–100.0)
MONOS PCT: 7 % (ref 3–12)
Monocytes Absolute: 0.6 10*3/uL (ref 0.1–1.0)
Neutro Abs: 6.3 10*3/uL (ref 1.7–7.7)
Neutrophils Relative %: 81 % — ABNORMAL HIGH (ref 43–77)
PLATELETS: 235 10*3/uL (ref 150–400)
RBC: 2.91 MIL/uL — ABNORMAL LOW (ref 3.87–5.11)
RDW: 17.3 % — ABNORMAL HIGH (ref 11.5–15.5)
WBC: 7.8 10*3/uL (ref 4.0–10.5)

## 2015-02-11 LAB — BASIC METABOLIC PANEL
ANION GAP: 5 (ref 5–15)
BUN: 23 mg/dL (ref 6–23)
CHLORIDE: 95 mmol/L — AB (ref 96–112)
CO2: 41 mmol/L — AB (ref 19–32)
Calcium: 8.9 mg/dL (ref 8.4–10.5)
Creatinine, Ser: 1.29 mg/dL — ABNORMAL HIGH (ref 0.50–1.10)
GFR calc Af Amer: 46 mL/min — ABNORMAL LOW (ref >90)
GFR calc non Af Amer: 40 mL/min — ABNORMAL LOW (ref >90)
Glucose, Bld: 107 mg/dL — ABNORMAL HIGH (ref 70–99)
Potassium: 4.3 mmol/L (ref 3.5–5.1)
Sodium: 141 mmol/L (ref 135–145)

## 2015-02-11 MED ORDER — IPRATROPIUM-ALBUTEROL 0.5-2.5 (3) MG/3ML IN SOLN
3.0000 mL | Freq: Once | RESPIRATORY_TRACT | Status: AC
  Administered 2015-02-11: 3 mL via RESPIRATORY_TRACT
  Filled 2015-02-11: qty 3

## 2015-02-11 MED ORDER — PREDNISONE 50 MG PO TABS: ORAL_TABLET | ORAL | Status: DC

## 2015-02-11 MED ORDER — ALBUTEROL SULFATE (2.5 MG/3ML) 0.083% IN NEBU
5.0000 mg | INHALATION_SOLUTION | Freq: Once | RESPIRATORY_TRACT | Status: AC
  Administered 2015-02-11: 5 mg via RESPIRATORY_TRACT
  Filled 2015-02-11: qty 6

## 2015-02-11 MED ORDER — PREDNISONE 50 MG PO TABS
60.0000 mg | ORAL_TABLET | Freq: Once | ORAL | Status: AC
  Administered 2015-02-11: 60 mg via ORAL
  Filled 2015-02-11 (×2): qty 1

## 2015-02-11 MED ORDER — ALBUTEROL SULFATE (2.5 MG/3ML) 0.083% IN NEBU
2.5000 mg | INHALATION_SOLUTION | Freq: Once | RESPIRATORY_TRACT | Status: AC
  Administered 2015-02-11: 2.5 mg via RESPIRATORY_TRACT
  Filled 2015-02-11: qty 3

## 2015-02-11 NOTE — ED Notes (Signed)
EMS called out for shortness of breath, pt. From [facility], wears 2L oxygen all the time;

## 2015-02-11 NOTE — ED Notes (Signed)
CRITICAL VALUE ALERT  Critical value received:  [id number] [id number]  Date of notification:  02/11/2015  Time of notification:  0037  Critical value read back:Yes.    Nurse who received alert:  [facility]   MD notified (1st page):  Dr. Christy Gentles  Time of first page:  [phone]  MD notified (2nd page):  Time of second page:  Responding MD:  Dr. Christy Gentles  Time MD responded: [phone]

## 2015-02-11 NOTE — ED Provider Notes (Signed)
CSN: [id number]     Arrival date & time 02/11/15  0076 History  This chart was scribed for April Fraise, MD by [name], ED Scribe. This patient was seen in room APA18/APA18 and the patient's care was started at 7:58 AM.    Chief Complaint  Patient presents with  . Shortness of Breath   Patient is a 74 y.o. female presenting with shortness of breath. The history is provided by the patient and the EMS personnel. No language interpreter was used.  Shortness of Breath Severity:  Moderate Onset quality:  Gradual Duration:  3 hours Timing:  Constant Progression:  Worsening Chronicity:  Chronic Context comment:  Waking up from sleep Relieved by:  Sitting up Worsened by:  Nothing tried Ineffective treatments:  Oxygen Associated symptoms: cough   Associated symptoms: no chest pain, no fever and no vomiting    HPI Comments: April Pearson is a 74 y.o. female with a history of COPD and emphysema, on 2L O2 all the time, brought in by ambulance, who presents to the Emergency Department complaining of SOB that has been going on for "a while" but acutely worsened when she woke up 3 hours ago. Per EMS, her symptoms improved after she got out of bed to sit in a recliner. EMS did not give her any breathing treatments en route. Patient endorses occasional cough, occasional generalized myalgias, dizziness, and chronic leg swelling. Patient is a former smoker. She is unable to ambulate without assistance. She denies fever, vomiting, or chest pain. Patient lives at [facility].   Past Medical History  Diagnosis Date  . COPD (chronic obstructive pulmonary disease)   . Osteoporosis   . Anxiety   . Weakness   . Tachycardia   . Cancer     bladder  . Depression   . Arthritis     KNEES AND HANDS  . Coarse tremors     TREMORS BOTH HANDS - PT STATES SIDE EFFECT OF HER MEDICATIONS FOR HER BREATHING  . Dizziness     sometimes  . Shortness of breath     USES OXYGEN 2 L / MIN NASAL CANNUA   24 HRS A  DAY; LIVES IN [name]--CAN AMBULATE SHORT DISTANCE BUT ACTIVITIES VERY LIMITED BY SOB  . Anginal pain     "with fluttering, seen MD about it"  . History of shingles   . Emphysema   . GERD (gastroesophageal reflux disease)     sometimes  . Laryngitis     10/2013, hx of  . Pneumonia     hx of  . Bipolar 1 disorder   . Cough 06/29/14    pt states recent cough / cold - feeling better - cough now non-productive.  . Frequent urination    Past Surgical History  Procedure Laterality Date  . Bladder surgery    . Cystoscopy w/ retrogrades Bilateral 06/19/2013    Procedure: CYSTOSCOPY WITH BIOSPY AND FULGERATION, BILATERAL RETROGRADE PYELOGRAM;  Surgeon: Malka So, MD;  Location: [facility];  Service: Urology;  Laterality: Bilateral;  . Breast surgery Left     FOR TUMOR - BENIGN  . Cataract extraction Bilateral 3 years ago  . Cystoscopy w/ retrogrades Bilateral 12/25/2013    Procedure: CYSTOSCOPY WITH BILATERAL RETROGRADE WITH BIOPSY WITH FULGERATION;  Surgeon: Irine Seal, MD;  Location: [facility];  Service: Urology;  Laterality: Bilateral;  . Transurethral resection of bladder tumor Bilateral 12/25/2013    Procedure: TRANSURETHRAL RESECTION OF BLADDER TUMOR (  TURBT);  Surgeon: Irine Seal, MD;  Location: [facility];  Service: Urology;  Laterality: Bilateral;  . Cystoscopy with retrograde pyelogram, ureteroscopy and stent placement Bilateral 07/02/2014    Procedure: CYSTOSCOPY WITH BILATERAL RETROGRADE PYELOGRAM;  Surgeon: Malka So, MD;  Location: [facility];  Service: Urology;  Laterality: Bilateral;  . Transurethral resection of bladder tumor N/A 07/02/2014    Procedure: TRANSURETHRAL RESECTION OF BLADDER TUMOR (TURBT);  Surgeon: Malka So, MD;  Location: [facility];  Service: Urology;  Laterality: N/A;   History reviewed. No pertinent family history. History  Substance Use Topics  . Smoking status: Former Smoker -- 2.00 packs/day for 40 years    Types: Cigarettes     Quit date: 11/13/2000  . Smokeless tobacco: Never Used     Comment: QUIT SMOKING YRS AGO  . Alcohol Use: No   OB History    No data available     Review of Systems  Constitutional: Negative for fever.  Respiratory: Positive for cough and shortness of breath.   Cardiovascular: Positive for leg swelling. Negative for chest pain.  Gastrointestinal: Negative for vomiting.  Musculoskeletal: Positive for myalgias.  Neurological: Positive for dizziness.  All other systems reviewed and are negative.    Allergies  Review of patient's allergies indicates no known allergies.  Home Medications   Prior to Admission medications   Medication Sig Start Date End Date Taking? Authorizing Provider  acetaminophen (TYLENOL) 325 MG tablet Take 325 mg by mouth 4 (four) times daily as needed for mild pain or moderate pain.     Historical Provider, MD  albuterol (PROVENTIL) (2.5 MG/3ML) 0.083% nebulizer solution Take 2.5 mg by nebulization 3 (three) times daily.    Historical Provider, MD  alendronate (FOSAMAX) 70 MG tablet Take 70 mg by mouth every Friday. Take with a full glass of water on an empty stomach.    Historical Provider, MD  ALPRAZolam Duanne Moron) 1 MG tablet Take 1 mg by mouth at bedtime.    Historical Provider, MD  aspirin EC 81 MG tablet Take 81 mg by mouth daily.    Historical Provider, MD  budesonide-formoterol (SYMBICORT) 160-4.5 MCG/ACT inhaler Inhale 2 puffs into the lungs 2 (two) times daily.    Historical Provider, MD  calcium-vitamin D (OSCAL WITH D) 500-200 MG-UNIT per tablet Take 1 tablet by mouth 2 (two) times daily.    Historical Provider, MD  cetirizine (ZYRTEC) 10 MG tablet Take 10 mg by mouth at bedtime.    Historical Provider, MD  dextromethorphan-guaiFENesin (ROBITUSSIN-DM) 10-100 MG/5ML liquid Take 10 mLs by mouth every 4 (four) hours as needed for cough.    Historical Provider, MD  diltiazem (TIAZAC) 240 MG 24 hr capsule Take 240 mg by mouth every morning.     Historical  Provider, MD  ferrous sulfate 325 (65 FE) MG tablet Take 325 mg by mouth daily.    Historical Provider, MD  FLUoxetine (PROZAC) 20 MG tablet Take 20 mg by mouth every morning.     Historical Provider, MD  fluticasone (FLONASE) 50 MCG/ACT nasal spray Place 2 sprays into the nose daily.    Historical Provider, MD  furosemide (LASIX) 40 MG tablet Take 1 tablet (40 mg total) by mouth every morning. As needed for edema 11/12/14   Sinda Du, MD  levofloxacin (LEVAQUIN) 500 MG tablet Take 1 tablet (500 mg total) by mouth every other day. 11/13/14   Sinda Du, MD  Multiple Vitamin (MULTIVITAMIN WITH MINERALS) TABS tablet Take 1 tablet by mouth daily.  Historical Provider, MD  predniSONE (DELTASONE) 10 MG tablet Take 1 tablet (10 mg total) by mouth daily with breakfast. 11/14/14   Sinda Du, MD  risperiDONE (RISPERDAL) 0.5 MG tablet Take 0.5 mg by mouth at bedtime.    Historical Provider, MD  tiotropium (SPIRIVA) 18 MCG inhalation capsule Place 18 mcg into inhaler and inhale every morning.    Historical Provider, MD  BP 172/60 mmHg  Pulse 69  Temp(Src) 97.5 F (36.4 C) (Oral)  Resp 16  SpO2 100%  LMP  (LMP Unknown)  LMP  (LMP Unknown) Physical Exam  Nursing note and vitals reviewed.  CONSTITUTIONAL: Well developed/well nourished HEAD: Normocephalic/atraumatic EYES: EOMI/PERRL ENMT: Mucous membranes moist NECK: supple no meningeal signs SPINE/BACK:entire spine nontender CV: S1/S2 noted, no murmurs/rubs/gallops noted  LUNGS: decreased breath sounds bilaterally ABDOMEN: soft, nontender, no rebound or guarding, bowel sounds noted throughout abdomen GU:no cva tenderness NEURO: Pt is awake/alert/appropriate, moves all extremitiesx4.  No facial droop.   EXTREMITIES: pulses normal/equal, full ROM; symmetric edema SKIN: warm, color normal PSYCH: no abnormalities of mood noted, alert and oriented to situation   ED Course  Procedures   DIAGNOSTIC STUDIES: Oxygen Saturation is 100%  on 2 L O2, normal by my interpretation.    COORDINATION OF CARE: 8:01 AM - Discussed treatment plan with pt at bedside which includes breathing treatments, and pt agreed to plan. 9:30 AM Pt inproved Lung sounds improving but will give another neb  11:06 AM - Patient reports she feels improved.  Patient at baseline, with no hypoxia.  Patient informed she will be discharged home.  She verbalizes understanding and agrees to plan.  chronic anemia noted Pt improved No distress I doubt ACS/PE/CHF/pneumonia at this time  pt ambulated in ED with minimal assistance  Medications  ipratropium-albuterol (DUONEB) 0.5-2.5 (3) MG/3ML nebulizer solution 3 mL (3 mLs Nebulization Given 02/11/15 0831)  albuterol (PROVENTIL) (2.5 MG/3ML) 0.083% nebulizer solution 2.5 mg (2.5 mg Nebulization Given 02/11/15 0831)  predniSONE (DELTASONE) tablet 60 mg (60 mg Oral Given 02/11/15 0819)  albuterol (PROVENTIL) (2.5 MG/3ML) 0.083% nebulizer solution 5 mg (5 mg Nebulization Given 02/11/15 1036)    Labs Review Labs Reviewed  CBC WITH DIFFERENTIAL/PLATELET - Abnormal; Notable for the following:    RBC 2.91 (*)    Hemoglobin 8.3 (*)    HCT 27.4 (*)    RDW 17.3 (*)    Neutrophils Relative % 81 (*)    Lymphocytes Relative 9 (*)    All other components within normal limits  BASIC METABOLIC PANEL - Abnormal; Notable for the following:    Chloride 95 (*)    CO2 41 (*)    Glucose, Bld 107 (*)    Creatinine, Ser 1.29 (*)    GFR calc non Af Amer 40 (*)    GFR calc Af Amer 46 (*)    All other components within normal limits    Imaging Review Dg Chest Portable 1 View  02/11/2015   CLINICAL DATA:  74 year old with shortness of breath. History of emphysema. Occasional cough.  EXAM: PORTABLE CHEST - 1 VIEW  COMPARISON:  11/08/2014  FINDINGS: Again noted is increased lucency in the upper lungs compatible with history of emphysema. There is an old fracture involving the left sixth rib. No focal airspace disease. Heart and  mediastinum are within normal limits. Atherosclerotic calcifications at the aortic arch. No acute bone abnormality.  IMPRESSION: No acute chest findings.   Electronically Signed   By: Markus Daft M.D.   On: 02/11/2015  08:30    ED ECG REPORT   Date: 02/11/2015 0801am  Rate: 71  Rhythm: normal sinus rhythm  QRS Axis: normal  Intervals: normal  ST/T Wave abnormalities: nonspecific ST changes  Conduction Disutrbances:none Unable to confirm in MUSE    MDM   Final diagnoses:  Chronic obstructive pulmonary disease with acute exacerbation    Nursing notes including past medical history and social history reviewed and considered in documentation xrays/imaging reviewed by myself and considered during evaluation Labs/vital reviewed myself and considered during evaluation    I personally performed the services described in this documentation, which was scribed in my presence. The recorded information has been reviewed and is accurate.      April Fraise, MD 02/11/15 [phone]

## 2015-03-01 ENCOUNTER — Observation Stay (HOSPITAL_COMMUNITY)
Admission: EM | Admit: 2015-03-01 | Discharge: 2015-03-04 | Disposition: A | Discharge status: Nursing Home (Includes ICF) | Payer: Medicare Other | Attending: Pulmonary Disease | Admitting: Pulmonary Disease

## 2015-03-01 ENCOUNTER — Emergency Department (HOSPITAL_COMMUNITY): Payer: Medicare Other

## 2015-03-01 ENCOUNTER — Encounter (HOSPITAL_COMMUNITY): Payer: Self-pay | Admitting: Emergency Medicine

## 2015-03-01 DIAGNOSIS — Z8551 Personal history of malignant neoplasm of bladder: Secondary | ICD-10-CM | POA: Diagnosis not present

## 2015-03-01 DIAGNOSIS — D509 Iron deficiency anemia, unspecified: Secondary | ICD-10-CM | POA: Diagnosis not present

## 2015-03-01 DIAGNOSIS — F419 Anxiety disorder, unspecified: Secondary | ICD-10-CM | POA: Insufficient documentation

## 2015-03-01 DIAGNOSIS — R072 Precordial pain: Secondary | ICD-10-CM | POA: Diagnosis not present

## 2015-03-01 DIAGNOSIS — Z860100 Personal history of colon polyps, unspecified: Secondary | ICD-10-CM

## 2015-03-01 DIAGNOSIS — F329 Major depressive disorder, single episode, unspecified: Secondary | ICD-10-CM | POA: Insufficient documentation

## 2015-03-01 DIAGNOSIS — Z79899 Other long term (current) drug therapy: Secondary | ICD-10-CM | POA: Diagnosis not present

## 2015-03-01 DIAGNOSIS — Z87891 Personal history of nicotine dependence: Secondary | ICD-10-CM | POA: Diagnosis not present

## 2015-03-01 DIAGNOSIS — K222 Esophageal obstruction: Secondary | ICD-10-CM | POA: Insufficient documentation

## 2015-03-01 DIAGNOSIS — D122 Benign neoplasm of ascending colon: Secondary | ICD-10-CM | POA: Diagnosis not present

## 2015-03-01 DIAGNOSIS — K621 Rectal polyp: Secondary | ICD-10-CM | POA: Diagnosis not present

## 2015-03-01 DIAGNOSIS — K449 Diaphragmatic hernia without obstruction or gangrene: Secondary | ICD-10-CM | POA: Insufficient documentation

## 2015-03-01 DIAGNOSIS — N179 Acute kidney failure, unspecified: Secondary | ICD-10-CM | POA: Diagnosis not present

## 2015-03-01 DIAGNOSIS — R0789 Other chest pain: Secondary | ICD-10-CM | POA: Diagnosis not present

## 2015-03-01 DIAGNOSIS — R1314 Dysphagia, pharyngoesophageal phase: Secondary | ICD-10-CM | POA: Diagnosis present

## 2015-03-01 DIAGNOSIS — J449 Chronic obstructive pulmonary disease, unspecified: Secondary | ICD-10-CM | POA: Diagnosis not present

## 2015-03-01 DIAGNOSIS — R079 Chest pain, unspecified: Secondary | ICD-10-CM | POA: Diagnosis present

## 2015-03-01 DIAGNOSIS — R609 Edema, unspecified: Secondary | ICD-10-CM | POA: Insufficient documentation

## 2015-03-01 DIAGNOSIS — K922 Gastrointestinal hemorrhage, unspecified: Principal | ICD-10-CM | POA: Diagnosis present

## 2015-03-01 DIAGNOSIS — R200 Anesthesia of skin: Secondary | ICD-10-CM | POA: Diagnosis not present

## 2015-03-01 DIAGNOSIS — D6489 Other specified anemias: Secondary | ICD-10-CM | POA: Diagnosis not present

## 2015-03-01 DIAGNOSIS — D649 Anemia, unspecified: Secondary | ICD-10-CM | POA: Diagnosis present

## 2015-03-01 DIAGNOSIS — G4700 Insomnia, unspecified: Secondary | ICD-10-CM | POA: Diagnosis not present

## 2015-03-01 DIAGNOSIS — K5730 Diverticulosis of large intestine without perforation or abscess without bleeding: Secondary | ICD-10-CM | POA: Diagnosis not present

## 2015-03-01 DIAGNOSIS — E860 Dehydration: Secondary | ICD-10-CM | POA: Insufficient documentation

## 2015-03-01 DIAGNOSIS — D125 Benign neoplasm of sigmoid colon: Secondary | ICD-10-CM | POA: Insufficient documentation

## 2015-03-01 DIAGNOSIS — D62 Acute posthemorrhagic anemia: Secondary | ICD-10-CM | POA: Diagnosis not present

## 2015-03-01 DIAGNOSIS — Z7982 Long term (current) use of aspirin: Secondary | ICD-10-CM | POA: Insufficient documentation

## 2015-03-01 DIAGNOSIS — F418 Other specified anxiety disorders: Secondary | ICD-10-CM | POA: Diagnosis present

## 2015-03-01 DIAGNOSIS — I10 Essential (primary) hypertension: Secondary | ICD-10-CM | POA: Diagnosis present

## 2015-03-01 HISTORY — DX: Insomnia, unspecified: G47.00

## 2015-03-01 HISTORY — DX: Edema, unspecified: R60.9

## 2015-03-01 LAB — COMPREHENSIVE METABOLIC PANEL
ALT: 18 U/L (ref 0–35)
AST: 18 U/L (ref 0–37)
Albumin: 3.5 g/dL (ref 3.5–5.2)
Alkaline Phosphatase: 80 U/L (ref 39–117)
Anion gap: 9 (ref 5–15)
BUN: 28 mg/dL — ABNORMAL HIGH (ref 6–23)
CHLORIDE: 93 mmol/L — AB (ref 96–112)
CO2: 38 mmol/L — AB (ref 19–32)
Calcium: 9 mg/dL (ref 8.4–10.5)
Creatinine, Ser: 1.2 mg/dL — ABNORMAL HIGH (ref 0.50–1.10)
GFR, EST AFRICAN AMERICAN: 51 mL/min — AB (ref >90)
GFR, EST NON AFRICAN AMERICAN: 44 mL/min — AB (ref >90)
GLUCOSE: 141 mg/dL — AB (ref 70–99)
Potassium: 4 mmol/L (ref 3.5–5.1)
SODIUM: 140 mmol/L (ref 135–145)
Total Bilirubin: 0.3 mg/dL (ref 0.3–1.2)
Total Protein: 6.4 g/dL (ref 6.0–8.3)

## 2015-03-01 LAB — CBC WITH DIFFERENTIAL/PLATELET
BASOS PCT: 0 % (ref 0–1)
Basophils Absolute: 0 10*3/uL (ref 0.0–0.1)
EOS ABS: 0.1 10*3/uL (ref 0.0–0.7)
EOS PCT: 1 % (ref 0–5)
HCT: 25.7 % — ABNORMAL LOW (ref 36.0–46.0)
HEMOGLOBIN: 8 g/dL — AB (ref 12.0–15.0)
Lymphocytes Relative: 8 % — ABNORMAL LOW (ref 12–46)
Lymphs Abs: 0.8 10*3/uL (ref 0.7–4.0)
MCH: 29.5 pg (ref 26.0–34.0)
MCHC: 31.1 g/dL (ref 30.0–36.0)
MCV: 94.8 fL (ref 78.0–100.0)
Monocytes Absolute: 0.8 10*3/uL (ref 0.1–1.0)
Monocytes Relative: 8 % (ref 3–12)
Neutro Abs: 7.8 10*3/uL — ABNORMAL HIGH (ref 1.7–7.7)
Neutrophils Relative %: 83 % — ABNORMAL HIGH (ref 43–77)
Platelets: 282 10*3/uL (ref 150–400)
RBC: 2.71 MIL/uL — AB (ref 3.87–5.11)
RDW: 17.1 % — AB (ref 11.5–15.5)
WBC: 9.5 10*3/uL (ref 4.0–10.5)

## 2015-03-01 LAB — PREPARE RBC (CROSSMATCH)

## 2015-03-01 LAB — BRAIN NATRIURETIC PEPTIDE: B NATRIURETIC PEPTIDE 5: 85 pg/mL (ref 0.0–100.0)

## 2015-03-01 LAB — RETICULOCYTES
RBC.: 2.97 MIL/uL — ABNORMAL LOW (ref 3.87–5.11)
Retic Count, Absolute: 50.5 10*3/uL (ref 19.0–186.0)
Retic Ct Pct: 1.7 % (ref 0.4–3.1)

## 2015-03-01 LAB — TROPONIN I
Troponin I: <0.03 ng/mL (ref <0.031)
Troponin I: <0.03 ng/mL (ref <0.031)

## 2015-03-01 LAB — POC OCCULT BLOOD, ED: Fecal Occult Bld: POSITIVE — AB

## 2015-03-01 LAB — ABO/RH: ABO/RH(D): O POS

## 2015-03-01 MED ORDER — FLUOXETINE HCL 20 MG PO CAPS: 20.0000 mg | ORAL_CAPSULE | Freq: Every day | ORAL | Status: DC

## 2015-03-01 MED ORDER — ALPRAZOLAM 1 MG PO TABS
1.0000 mg | ORAL_TABLET | Freq: Every day | ORAL | Status: DC
  Administered 2015-03-01 – 2015-03-03 (×3): 1 mg via ORAL
  Filled 2015-03-01 (×3): qty 1

## 2015-03-01 MED ORDER — CETYLPYRIDINIUM CHLORIDE 0.05 % MT LIQD
7.0000 mL | Freq: Two times a day (BID) | OROMUCOSAL | Status: DC
  Administered 2015-03-01 – 2015-03-04 (×6): 7 mL via OROMUCOSAL

## 2015-03-01 MED ORDER — DILTIAZEM HCL ER COATED BEADS 240 MG PO CP24
240.0000 mg | ORAL_CAPSULE | Freq: Every day | ORAL | Status: DC
  Administered 2015-03-02 – 2015-03-04 (×3): 240 mg via ORAL
  Filled 2015-03-01 (×7): qty 1

## 2015-03-01 MED ORDER — ASPIRIN EC 81 MG PO TBEC: 81.0000 mg | DELAYED_RELEASE_TABLET | Freq: Every day | ORAL | Status: DC

## 2015-03-01 MED ORDER — FUROSEMIDE 40 MG PO TABS
40.0000 mg | ORAL_TABLET | Freq: Every day | ORAL | Status: DC
  Administered 2015-03-02 – 2015-03-04 (×3): 40 mg via ORAL
  Filled 2015-03-01 (×3): qty 1

## 2015-03-01 MED ORDER — SODIUM CHLORIDE 0.9 % IV SOLN: Freq: Once | INTRAVENOUS | Status: AC

## 2015-03-01 MED ORDER — ONDANSETRON HCL 4 MG PO TABS: 4.0000 mg | ORAL_TABLET | Freq: Four times a day (QID) | ORAL | Status: DC | PRN

## 2015-03-01 MED ORDER — ASPIRIN EC 81 MG PO TBEC
81.0000 mg | DELAYED_RELEASE_TABLET | Freq: Every day | ORAL | Status: DC
  Administered 2015-03-02 – 2015-03-04 (×3): 81 mg via ORAL
  Filled 2015-03-01 (×3): qty 1

## 2015-03-01 MED ORDER — SODIUM CHLORIDE 0.9 % IV SOLN
INTRAVENOUS | Status: DC
  Administered 2015-03-01: 1000 mL via INTRAVENOUS
  Administered 2015-03-02 – 2015-03-03 (×3): via INTRAVENOUS

## 2015-03-01 MED ORDER — OXYCODONE HCL 5 MG PO TABS: 5.0000 mg | ORAL_TABLET | ORAL | Status: DC | PRN

## 2015-03-01 MED ORDER — SODIUM CHLORIDE 0.9 % IV SOLN
8.0000 mg/h | INTRAVENOUS | Status: DC
  Administered 2015-03-01: 8 mg/h via INTRAVENOUS
  Filled 2015-03-01 (×7): qty 80

## 2015-03-01 MED ORDER — GI COCKTAIL ~~LOC~~: 30.0000 mL | Freq: Four times a day (QID) | ORAL | Status: DC | PRN

## 2015-03-01 MED ORDER — TIOTROPIUM BROMIDE MONOHYDRATE 18 MCG IN CAPS
18.0000 ug | ORAL_CAPSULE | Freq: Every day | RESPIRATORY_TRACT | Status: DC
  Administered 2015-03-02 – 2015-03-04 (×3): 18 ug via RESPIRATORY_TRACT
  Filled 2015-03-01 (×2): qty 5

## 2015-03-01 MED ORDER — FLUOXETINE HCL 20 MG PO CAPS
20.0000 mg | ORAL_CAPSULE | Freq: Every day | ORAL | Status: DC
  Administered 2015-03-02 – 2015-03-04 (×3): 20 mg via ORAL
  Filled 2015-03-01 (×3): qty 1

## 2015-03-01 MED ORDER — HEPARIN SODIUM (PORCINE) 5000 UNIT/ML IJ SOLN: 5000.0000 [IU] | Freq: Three times a day (TID) | INTRAMUSCULAR | Status: DC

## 2015-03-01 MED ORDER — RISPERIDONE 0.5 MG PO TABS
0.5000 mg | ORAL_TABLET | Freq: Every day | ORAL | Status: DC
Start: 2015-03-01 — End: 2015-03-04
  Administered 2015-03-01 – 2015-03-03 (×3): 0.5 mg via ORAL
  Filled 2015-03-01 (×5): qty 1

## 2015-03-01 MED ORDER — ALBUTEROL SULFATE (2.5 MG/3ML) 0.083% IN NEBU
2.5000 mg | INHALATION_SOLUTION | Freq: Four times a day (QID) | RESPIRATORY_TRACT | Status: DC | PRN
  Administered 2015-03-02 – 2015-03-04 (×2): 2.5 mg via RESPIRATORY_TRACT
  Filled 2015-03-01 (×2): qty 3

## 2015-03-01 MED ORDER — SODIUM CHLORIDE 0.9 % IV SOLN
80.0000 mg | Freq: Once | INTRAVENOUS | Status: AC
  Administered 2015-03-01: 80 mg via INTRAVENOUS
  Filled 2015-03-01: qty 80

## 2015-03-01 MED ORDER — PANTOPRAZOLE SODIUM 40 MG IV SOLR: 40.0000 mg | Freq: Two times a day (BID) | INTRAVENOUS | Status: DC

## 2015-03-01 MED ORDER — ACETAMINOPHEN 325 MG PO TABS: 650.0000 mg | ORAL_TABLET | ORAL | Status: DC | PRN

## 2015-03-01 MED ORDER — ONDANSETRON HCL 4 MG/2ML IJ SOLN: 4.0000 mg | Freq: Four times a day (QID) | INTRAMUSCULAR | Status: DC | PRN

## 2015-03-01 NOTE — Progress Notes (Signed)
MD gave orders for the patient to have a cardiac diet tonight then NPO after MN

## 2015-03-01 NOTE — ED Notes (Signed)
[place] giver # [phone].

## 2015-03-01 NOTE — ED Provider Notes (Signed)
CSN: [id number]     Arrival date & time 03/01/15  1332 History   First MD Initiated Contact with Patient 03/01/15 1348     Chief Complaint  Patient presents with  . Chest Pain     (Consider location/radiation/quality/duration/timing/severity/associated sxs/prior Treatment) Patient is a 74 y.o. female presenting with chest pain. The history is provided by the patient (pt complains of some chest pains off and on for a few weeks).  Chest Pain Pain location:  Substernal area Pain quality: aching   Pain radiates to:  Does not radiate Pain radiates to the back: no   Pain severity:  Mild Onset quality:  Sudden Timing:  Intermittent Progression:  Waxing and waning Chronicity:  New Context: no drug use   Associated symptoms: no abdominal pain, no back pain, no cough, no fatigue and no headache     Past Medical History  Diagnosis Date  . COPD (chronic obstructive pulmonary disease)   . Cancer    Past Surgical History  Procedure Laterality Date  . Bladder surgery     Family History  Problem Relation Age of Onset  . Hypertension Mother   . Kidney failure Mother   . Cancer Father   . Asthma Father   . Heart failure Father   . Hypertension Father    History  Substance Use Topics  . Smoking status: Former Research scientist (life sciences)  . Smokeless tobacco: Never Used  . Alcohol Use: No   OB History    No data available     Review of Systems  Constitutional: Negative for appetite change and fatigue.  HENT: Negative for congestion, ear discharge and sinus pressure.   Eyes: Negative for discharge.  Respiratory: Negative for cough.   Cardiovascular: Positive for chest pain.  Gastrointestinal: Negative for abdominal pain and diarrhea.  Genitourinary: Negative for frequency and hematuria.  Musculoskeletal: Negative for back pain.  Skin: Negative for rash.  Neurological: Negative for seizures and headaches.  Psychiatric/Behavioral: Negative for hallucinations.      Allergies  Review of  patient's allergies indicates no known allergies.  Home Medications   Prior to Admission medications   Medication Sig Start Date End Date Taking? Authorizing Provider  albuterol (PROVENTIL) (2.5 MG/3ML) 0.083% nebulizer solution Take 2.5 mg by nebulization every 6 (six) hours as needed for wheezing or shortness of breath.   Yes Historical Provider, MD  alendronate (FOSAMAX) 70 MG tablet Take 70 mg by mouth every Friday. Take with a full glass of water on an empty stomach.   Yes Historical Provider, MD  ALPRAZolam Duanne Moron) 1 MG tablet Take 1 mg by mouth at bedtime.   Yes Historical Provider, MD  aspirin EC 81 MG tablet Take 81 mg by mouth daily.   Yes Historical Provider, MD  diltiazem (DILACOR XR) 240 MG 24 hr capsule Take 240 mg by mouth daily.   Yes Historical Provider, MD  ferrous sulfate 325 (65 FE) MG tablet Take 325 mg by mouth daily with breakfast.   Yes Historical Provider, MD  FLUoxetine (PROZAC) 20 MG capsule Take 20 mg by mouth daily.   Yes Historical Provider, MD  fluticasone (FLONASE) 50 MCG/ACT nasal spray Place 2 sprays into both nostrils daily.   Yes Historical Provider, MD  fluticasone (FLONASE) 50 MCG/ACT nasal spray Place 2 sprays into both nostrils daily. 02/26/15  Yes Historical Provider, MD  furosemide (LASIX) 40 MG tablet Take 40 mg by mouth daily. 02/19/15  Yes Historical Provider, MD  risperiDONE (RISPERDAL) 0.5 MG tablet Take 0.5 mg  by mouth at bedtime.   Yes Historical Provider, MD  tiotropium (SPIRIVA) 18 MCG inhalation capsule Place 18 mcg into inhaler and inhale daily.   Yes Historical Provider, MD   BP 142/64 mmHg  Pulse 74  Temp(Src) 98.2 F (36.8 C) (Oral)  Resp 22  SpO2 100% Physical Exam  Constitutional: She is oriented to person, place, and time. She appears well-developed.  HENT:  Head: Normocephalic.  Eyes: Conjunctivae and EOM are normal. No scleral icterus.  Neck: Neck supple. No thyromegaly present.  Cardiovascular: Normal rate and regular rhythm.   Exam reveals no gallop and no friction rub.   No murmur heard. Pulmonary/Chest: No stridor. She has no wheezes. She has no rales. She exhibits no tenderness.  Abdominal: She exhibits no distension. There is no tenderness. There is no rebound.  Musculoskeletal: Normal range of motion. She exhibits no edema.  Lymphadenopathy:    She has no cervical adenopathy.  Neurological: She is oriented to person, place, and time. She exhibits normal muscle tone. Coordination normal.  Skin: No rash noted. No erythema.  Psychiatric: She has a normal mood and affect. Her behavior is normal.    ED Course  Procedures (including critical care time) Labs Review Labs Reviewed  CBC WITH DIFFERENTIAL/PLATELET - Abnormal; Notable for the following:    RBC 2.71 (*)    Hemoglobin 8.0 (*)    HCT 25.7 (*)    RDW 17.1 (*)    Neutrophils Relative % 83 (*)    Neutro Abs 7.8 (*)    Lymphocytes Relative 8 (*)    All other components within normal limits  COMPREHENSIVE METABOLIC PANEL - Abnormal; Notable for the following:    Chloride 93 (*)    CO2 38 (*)    Glucose, Bld 141 (*)    BUN 28 (*)    Creatinine, Ser 1.20 (*)    GFR calc non Af Amer 44 (*)    GFR calc Af Amer 51 (*)    All other components within normal limits  POC OCCULT BLOOD, ED - Abnormal; Notable for the following:    Fecal Occult Bld POSITIVE (*)    All other components within normal limits  TROPONIN I  BRAIN NATRIURETIC PEPTIDE  VITAMIN B12  FOLATE  IRON AND TIBC  FERRITIN  RETICULOCYTES    Imaging Review Dg Chest Portable 1 View  03/01/2015   CLINICAL DATA:  Acute left-sided chest pain.  EXAM: PORTABLE CHEST - 1 VIEW  COMPARISON:  None.  FINDINGS: The heart size and mediastinal contours are within normal limits. Both lungs are clear. No pneumothorax or pleural effusion is noted. Hyperexpansion of the lungs is noted consistent with chronic obstructive pulmonary disease. The visualized skeletal structures are unremarkable.  IMPRESSION:  Findings consistent with chronic obstructive pulmonary disease. No acute cardiopulmonary abnormality seen.   Electronically Signed   By: Marijo Conception, M.D.   On: 03/01/2015 14:27     EKG Interpretation   Date/Time:  Monday March 01 2015 13:49:31 EDT Ventricular Rate:  83 PR Interval:  128 QRS Duration: 89 QT Interval:  372 QTC Calculation: 437 R Axis:   67 Text Interpretation:  Sinus rhythm Low voltage, precordial leads Confirmed  by Crespin Forstrom  MD, Burris Matherne [phone]) on 03/01/2015 1:58:39 PM      MDM   Final diagnoses:  Iron deficiency anemia   Admit for chest pain and anemia    Milton Ferguson, MD 03/01/15 1650

## 2015-03-01 NOTE — H&P (Signed)
[facility] History and Physical  April Pearson QAS:[id number] DOB: 01-21-1941 DOA: 03/01/2015  Referring physician: Dr Roderic Palau - [facility] PCP: No primary care provider on file.   Chief Complaint: CP  HPI: April Pearson is a 74 y.o. female  Periodic CP. April Pearson and stabbing. Per pt she has chronic ongoing CP that was worse today than normal. todays episode associated w/ L sided facial numbness. Denies associated dissiness, LOC, nausea, HA, diaphoresis. Pain started this am while watching TV and resolved upon presentation to ED. Lives in a rest home.  Denies any recent melanic stool, hematochezia, or hematemesis.   Review of Systems:  Constitutional:  No weight loss, night sweats, Fevers, chills, fatigue.  HEENT:  No headaches, Difficulty swallowing,Tooth/dental problems,Sore throat,  No sneezing, itching, ear ache, nasal congestion, post nasal drip,  Cardio-vascular:  Per hpi GI:  No heartburn, indigestion, abdominal pain, nausea, vomiting, diarrhea, change in bowel habits, loss of appetite  Resp:   No shortness of breath with exertion or at rest. No excess mucus, no productive cough, No non-productive cough, No coughing up of blood.No change in color of mucus.No wheezing.No chest wall deformity  Skin:  no rash or lesions.  GU:  no dysuria, change in color of urine, no urgency or frequency. No flank pain.  Musculoskeletal:   No joint pain. No decreased range of motion. No back pain.  Psych:  No change in mood or affect. No depression or anxiety. No memory loss.   Past Medical History  Diagnosis Date  . COPD (chronic obstructive pulmonary disease)   . Cancer     bladder  . Anxiety   . Depression   . Edema   . Insomnia    Past Surgical History  Procedure Laterality Date  . Bladder surgery  2015   Social History:  reports that she has quit smoking. She has never used smokeless tobacco. She reports that she does not drink alcohol or use illicit drugs.  No Known  Allergies  Family History  Problem Relation Age of Onset  . Hypertension Mother   . Kidney failure Mother   . Cancer Father   . Asthma Father   . Heart failure Father   . Hypertension Father      Prior to Admission medications   Medication Sig Start Date End Date Taking? Authorizing Provider  albuterol (PROVENTIL) (2.5 MG/3ML) 0.083% nebulizer solution Take 2.5 mg by nebulization every 6 (six) hours as needed for wheezing or shortness of breath.   Yes Historical Provider, MD  alendronate (FOSAMAX) 70 MG tablet Take 70 mg by mouth every Friday. Take with a full glass of water on an empty stomach.   Yes Historical Provider, MD  ALPRAZolam Duanne Moron) 1 MG tablet Take 1 mg by mouth at bedtime.   Yes Historical Provider, MD  aspirin EC 81 MG tablet Take 81 mg by mouth daily.   Yes Historical Provider, MD  diltiazem (DILACOR XR) 240 MG 24 hr capsule Take 240 mg by mouth daily.   Yes Historical Provider, MD  ferrous sulfate 325 (65 FE) MG tablet Take 325 mg by mouth daily with breakfast.   Yes Historical Provider, MD  FLUoxetine (PROZAC) 20 MG capsule Take 20 mg by mouth daily.   Yes Historical Provider, MD  fluticasone (FLONASE) 50 MCG/ACT nasal spray Place 2 sprays into both nostrils daily.   Yes Historical Provider, MD  fluticasone (FLONASE) 50 MCG/ACT nasal spray Place 2 sprays into both nostrils daily. 02/26/15  Yes Historical Provider,  MD  furosemide (LASIX) 40 MG tablet Take 40 mg by mouth daily. 02/19/15  Yes Historical Provider, MD  risperiDONE (RISPERDAL) 0.5 MG tablet Take 0.5 mg by mouth at bedtime.   Yes Historical Provider, MD  tiotropium (SPIRIVA) 18 MCG inhalation capsule Place 18 mcg into inhaler and inhale daily.   Yes Historical Provider, MD   Physical Exam: Filed Vitals:   03/01/15 1600 03/01/15 1618 03/01/15 1630 03/01/15 1700  BP: 142/64 142/64 138/66 152/68  Pulse: 77 74 74 75  Temp:  98.2 F (36.8 C)    TempSrc:  Oral    Resp: 24 22 20 24   SpO2: 100% 100% 100% 100%     Wt Readings from Last 3 Encounters:  No data found for Wt    General:  Appears calm and comfortable Eyes:  PERRL, normal lids, irises & conjunctiva ENT:  grossly normal hearing, lips & tongue Neck:  no LAD, masses or thyromegaly Cardiovascular:  RRR, no m/r/g. 1+ pitting edema Telemetry:  SR, no arrhythmias  Respiratory: Diminished breath sounds in lung bases w/ occasional crackles  Abdomen:  soft, ntnd Skin:  no rash or induration seen on limited exam Musculoskeletal:  grossly normal tone BUE/BLE Psychiatric:  grossly normal mood and affect, speech fluent and appropriate Neurologic:  grossly non-focal.          Labs on Admission:  Basic Metabolic Panel:  Recent Labs Lab 03/01/15 1400  NA 140  K 4.0  CL 93*  CO2 38*  GLUCOSE 141*  BUN 28*  CREATININE 1.20*  CALCIUM 9.0   Liver Function Tests:  Recent Labs Lab 03/01/15 1400  AST 18  ALT 18  ALKPHOS 80  BILITOT 0.3  PROT 6.4  ALBUMIN 3.5   No results for input(s): LIPASE, AMYLASE in the last 168 hours. No results for input(s): AMMONIA in the last 168 hours. CBC:  Recent Labs Lab 03/01/15 1400  WBC 9.5  NEUTROABS 7.8*  HGB 8.0*  HCT 25.7*  MCV 94.8  PLT 282   Cardiac Enzymes:  Recent Labs Lab 03/01/15 1400  TROPONINI <0.03    BNP (last 3 results)  Recent Labs  03/01/15 1400  BNP 85.0    ProBNP (last 3 results) No results for input(s): PROBNP in the last 8760 hours.  CBG: No results for input(s): GLUCAP in the last 168 hours.  Radiological Exams on Admission: Dg Chest Portable 1 View  03/01/2015   CLINICAL DATA:  Acute left-sided chest pain.  EXAM: PORTABLE CHEST - 1 VIEW  COMPARISON:  None.  FINDINGS: The heart size and mediastinal contours are within normal limits. Both lungs are clear. No pneumothorax or pleural effusion is noted. Hyperexpansion of the lungs is noted consistent with chronic obstructive pulmonary disease. The visualized skeletal structures are unremarkable.   IMPRESSION: Findings consistent with chronic obstructive pulmonary disease. No acute cardiopulmonary abnormality seen.   Electronically Signed   By: Marijo Conception, M.D.   On: 03/01/2015 14:27    EKG: Independently reviewed. Sinus. Low voltage. No sign of ACS  Assessment/Plan Principal Problem:   GI bleed Active Problems:   Anemia   Chest pain   AKI (acute kidney injury)   Essential hypertension   COPD (chronic obstructive pulmonary disease)   Depression with anxiety   Insomnia   GI bleed: Hgb 8, total heme positive. Unsure of baseline hemoglobin but per report from Dr. Luan Pulling this is particularly low for the patient. Patient also presenting with chest pain. - Telemetry - GI consult  in the a.m. - Transfused 2 units PRBC - Nothing by mouth after midnight - Protonix drip  Chest pain: Patient with chronic ongoing chest pain but with acute worsening today. Atypical. Troponin negative, EKG without sign of ACS - Telemetry - Cycle troponins - GI cocktail - EKG in a.m.  COPD: No acute exacerbation. - Continue albuterol when necessary - Continue home Spiriva  Dependent edema: At baseline per patient - Continue Lasix  AK I: 1.2, BUN 28. No previous days to compare. Likely secondary to dehydration and marked anemia, and diuretic use. - Gentle hydration - BMET in am  HTN: - continue dilt  Depression/anxiety: - Continue Prozac and Xanax  Insomnia: - Continue Risperdal    Code Status: FULL DVT Prophylaxis: SCD - GI bleed Family Communication: None Disposition Plan: pending workup  Saidi Santacroce Lenna Sciara, MD Family Medicine [facility] [email or web address] Password [email or web address]

## 2015-03-01 NOTE — ED Notes (Signed)
Pt's caregiver -Onalee Hua was notified of pt. Being admitted. She will call family and update them.

## 2015-03-01 NOTE — ED Notes (Signed)
Pt reports onset of L sided chest pain this am approx 0900. Pt denies n/v or diaphoresis. Pt states she is always SOB.

## 2015-03-02 ENCOUNTER — Encounter (HOSPITAL_COMMUNITY): Payer: Self-pay | Admitting: Gastroenterology

## 2015-03-02 DIAGNOSIS — D500 Iron deficiency anemia secondary to blood loss (chronic): Secondary | ICD-10-CM

## 2015-03-02 DIAGNOSIS — R1314 Dysphagia, pharyngoesophageal phase: Secondary | ICD-10-CM | POA: Diagnosis not present

## 2015-03-02 DIAGNOSIS — K922 Gastrointestinal hemorrhage, unspecified: Secondary | ICD-10-CM | POA: Diagnosis not present

## 2015-03-02 LAB — BASIC METABOLIC PANEL
Anion gap: 7 (ref 5–15)
BUN: 25 mg/dL — ABNORMAL HIGH (ref 6–23)
CALCIUM: 8.5 mg/dL (ref 8.4–10.5)
CO2: 36 mmol/L — AB (ref 19–32)
Chloride: 99 mmol/L (ref 96–112)
Creatinine, Ser: 1.05 mg/dL (ref 0.50–1.10)
GFR calc Af Amer: 60 mL/min — ABNORMAL LOW (ref >90)
GFR calc non Af Amer: 51 mL/min — ABNORMAL LOW (ref >90)
GLUCOSE: 102 mg/dL — AB (ref 70–99)
POTASSIUM: 4.2 mmol/L (ref 3.5–5.1)
Sodium: 142 mmol/L (ref 135–145)

## 2015-03-02 LAB — CBC
HCT: 33.5 % — ABNORMAL LOW (ref 36.0–46.0)
HEMOGLOBIN: 10.3 g/dL — AB (ref 12.0–15.0)
MCH: 28.9 pg (ref 26.0–34.0)
MCHC: 30.7 g/dL (ref 30.0–36.0)
MCV: 94.1 fL (ref 78.0–100.0)
Platelets: 244 10*3/uL (ref 150–400)
RBC: 3.56 MIL/uL — ABNORMAL LOW (ref 3.87–5.11)
RDW: 16.2 % — ABNORMAL HIGH (ref 11.5–15.5)
WBC: 10.3 10*3/uL (ref 4.0–10.5)

## 2015-03-02 LAB — IRON AND TIBC
IRON: 63 ug/dL (ref 42–145)
Saturation Ratios: 19 % — ABNORMAL LOW (ref 20–55)
TIBC: 326 ug/dL (ref 250–470)
UIBC: 263 ug/dL (ref 125–400)

## 2015-03-02 LAB — FOLATE

## 2015-03-02 LAB — APTT: aPTT: 30 seconds (ref 24–37)

## 2015-03-02 LAB — PROTIME-INR
INR: 1.04 (ref 0.00–1.49)
Prothrombin Time: 13.7 seconds (ref 11.6–15.2)

## 2015-03-02 LAB — FERRITIN: FERRITIN: 14 ng/mL (ref 10–291)

## 2015-03-02 LAB — VITAMIN B12: VITAMIN B 12: 663 pg/mL (ref 211–911)

## 2015-03-02 MED ORDER — PANTOPRAZOLE SODIUM 40 MG PO TBEC
40.0000 mg | DELAYED_RELEASE_TABLET | Freq: Two times a day (BID) | ORAL | Status: DC
  Administered 2015-03-02 – 2015-03-04 (×4): 40 mg via ORAL
  Filled 2015-03-02 (×4): qty 1

## 2015-03-02 MED ORDER — ALBUTEROL SULFATE (2.5 MG/3ML) 0.083% IN NEBU
2.5000 mg | INHALATION_SOLUTION | Freq: Three times a day (TID) | RESPIRATORY_TRACT | Status: DC
  Administered 2015-03-02 – 2015-03-04 (×6): 2.5 mg via RESPIRATORY_TRACT
  Filled 2015-03-02 (×6): qty 3

## 2015-03-02 MED ORDER — PANTOPRAZOLE SODIUM 40 MG IV SOLR
40.0000 mg | Freq: Two times a day (BID) | INTRAVENOUS | Status: DC
  Administered 2015-03-02: 40 mg via INTRAVENOUS
  Filled 2015-03-02: qty 40

## 2015-03-02 MED ORDER — SODIUM CHLORIDE 0.9 % IV SOLN: INTRAVENOUS | Status: DC

## 2015-03-02 MED ORDER — PEG 3350-KCL-NA BICARB-NACL 420 G PO SOLR: 2000.0000 mL | Freq: Once | ORAL | Status: DC

## 2015-03-02 MED ORDER — PEG 3350-KCL-NA BICARB-NACL 420 G PO SOLR
2000.0000 mL | Freq: Once | ORAL | Status: AC
  Administered 2015-03-03: 2000 mL via ORAL

## 2015-03-02 MED ORDER — PEG 3350-KCL-NA BICARB-NACL 420 G PO SOLR
2000.0000 mL | Freq: Once | ORAL | Status: AC
  Administered 2015-03-02: 2000 mL via ORAL
  Filled 2015-03-02: qty 4000

## 2015-03-02 MED ORDER — TUBERCULIN PPD 5 UNIT/0.1ML ID SOLN
5.0000 [IU] | Freq: Once | INTRADERMAL | Status: DC
Start: 2015-03-03 — End: 2015-03-04
  Administered 2015-03-03: 5 [IU] via INTRADERMAL
  Filled 2015-03-02: qty 0.1

## 2015-03-02 NOTE — Clinical Social Work Psychosocial (Signed)
Clinical Social Work Department BRIEF PSYCHOSOCIAL ASSESSMENT 03/02/2015  Patient:  April Pearson, April Pearson     Account Number:  [id number]     Admit date:  03/01/2015  Clinical Social Worker:  Wyatt Haste  Date/Time:  03/02/2015 09:49 AM  Referred by:  CSW  Date Referred:  03/02/2015 Referred for  ALF Placement   Other Referral:   Interview type:  Patient Other interview type:    PSYCHOSOCIAL DATA Living Status:  FACILITY Admitted from facility:  Other Level of care:  [place] Primary support name:  Beverly Gust Primary support relationship to patient:  [place] Degree of support available:   supportive per pt    CURRENT CONCERNS Current Concerns  Post-Acute Placement   Other Concerns:    SOCIAL WORK ASSESSMENT / PLAN CSW met with pt at bedside. Pt alert and oriented and reports she has been a resident at [facility] for several weeks now. She had been at [facility] for about 8 years, but she closed her facility. Pt indicates she would rather have stayed at [facility] as she considered this her home, but she is adjusting. Another resident moved from [facility] to [facility] and this has helped transition somewhat. She has three sisters who live locally and pt describes them as her best support and said they visit regularly. Pt came to ED after complaining of chest pain. Pt also found to have GI bleed. She plans to return to [facility] at d/c. CSW spoke with [place] who confirms above information. Pt is fairly independent with ADLs, just requiring assistance with bathing. Pt occasionally uses a walker. No home health prior to admission and pt is okay to return.   Assessment/plan status:  Psychosocial Support/Ongoing Assessment of Needs Other assessment/ plan:   Information/referral to community resources:   [place]    PATIENT'S/FAMILY'S RESPONSE TO PLAN OF CARE: Pt reports she is adjusting to new facility and plans to return when medically stable. CSW  will continue to follow.       Benay Pike, [place]

## 2015-03-02 NOTE — Care Management Note (Addendum)
    Page 1 of 1   03/04/2015     12:57:35 PM CARE MANAGEMENT NOTE 03/04/2015  Patient:  April Pearson, April Pearson   Account Number:  [id number]  Date Initiated:  03/02/2015  Documentation initiated by:  CHILDRESS,JESSICA  Subjective/Objective Assessment:   Pt admitted with GI bleed. Pt is from [facility]F. Pt needs assistance with  bathing and uses a walker PRN. Pt plans to return to facility at discharge. CSW is aware and will arrange for return to ALF at DC.     Action/Plan:   No CM needs.   Anticipated DC Date:  03/04/2015   Anticipated DC Plan:  ASSISTED LIVING / REST HOME  In-house referral  Clinical Social Worker      DC Planning Services  CM consult      Choice offered to / List presented to:             Status of service:  Completed, signed off Medicare Important Message given?  YES (If response is "NO", the following Medicare IM given date fields will be blank) Date Medicare IM given:  03/04/2015 Medicare IM given by:  Jolene Provost Date Additional Medicare IM given:   Additional Medicare IM given by:    Discharge Disposition:  ASSISTED LIVING  Per UR Regulation:    If discussed at Long Length of Stay Meetings, dates discussed:    Comments:  03/04/2015 [place], RN, MSN, CM 03/02/2015 [place], RN, MSN, CM

## 2015-03-02 NOTE — Consult Note (Signed)
Referring Provider: Dr. Luan Pulling Primary Care Physician:  Dr. Luan Pulling Primary Gastroenterologist:  Dr. Oneida Alar   Date of Admission: 03/01/15 Date of Consultation: 03/02/15  Reason for Consultation:  GI bleed  HPI:  April Pearson is a 74 y.o. year old female presenting to the ED with complaints of facial numbness, chronic intermittent left-sided chest pain, heme positive stool, and Hgb 8. Patient has 2 charts in epic, which are in the process of merging. Upon review of separate chart, Hgb around 8 in Dec 2015 with ferritin 92, TIBC 245, and iron 18. On admission, Hgb 8, ferritin 14, TIBC 326, iron 63. She has received 2 units PRBCs with appropriate response in Hgb to 10 range. Troponins negative, EKG on admission without signs of ACS.   States left-sided chest pain, chronically, intermittently, face felt numb. Couldn't walk very well. Feels weak. Black, tarry stool "long time". Aspirin daily. Iron supplement daily. Unsure if black stool was present prior to taking iron. Denies decreased appetite. Solid food dysphagia for about a year. Bends over and food comes up in her mouth. No abdominal pain. No N/V. No hematochezia. Stays constipated. Hard stool.   No prior colonoscopy. No prior endoscopy. Father and brother both diagnosed with colon cancer in their late 26s. Brother succumbed to the disease.   Past Medical History  Diagnosis Date  . COPD (chronic obstructive pulmonary disease)   . Cancer     bladder  . Anxiety   . Depression   . Edema   . Insomnia     Past Surgical History  Procedure Laterality Date  . Bladder surgery  2015  . Breast cyst removal      Prior to Admission medications   Medication Sig Start Date End Date Taking? Authorizing Provider  albuterol (PROVENTIL) (2.5 MG/3ML) 0.083% nebulizer solution Take 2.5 mg by nebulization every 6 (six) hours as needed for wheezing or shortness of breath.   Yes Historical Provider, MD  alendronate (FOSAMAX) 70 MG tablet Take  70 mg by mouth every Friday. Take with a full glass of water on an empty stomach.   Yes Historical Provider, MD  ALPRAZolam Duanne Moron) 1 MG tablet Take 1 mg by mouth at bedtime.   Yes Historical Provider, MD  aspirin EC 81 MG tablet Take 81 mg by mouth daily.   Yes Historical Provider, MD  diltiazem (DILACOR XR) 240 MG 24 hr capsule Take 240 mg by mouth daily.   Yes Historical Provider, MD  ferrous sulfate 325 (65 FE) MG tablet Take 325 mg by mouth daily with breakfast.   Yes Historical Provider, MD  FLUoxetine (PROZAC) 20 MG capsule Take 20 mg by mouth daily.   Yes Historical Provider, MD  fluticasone (FLONASE) 50 MCG/ACT nasal spray Place 2 sprays into both nostrils daily.   Yes Historical Provider, MD  fluticasone (FLONASE) 50 MCG/ACT nasal spray Place 2 sprays into both nostrils daily. 02/26/15  Yes Historical Provider, MD  furosemide (LASIX) 40 MG tablet Take 40 mg by mouth daily. 02/19/15  Yes Historical Provider, MD  risperiDONE (RISPERDAL) 0.5 MG tablet Take 0.5 mg by mouth at bedtime.   Yes Historical Provider, MD  tiotropium (SPIRIVA) 18 MCG inhalation capsule Place 18 mcg into inhaler and inhale daily.   Yes Historical Provider, MD    Current Facility-Administered Medications  Medication Dose Route Frequency Provider Last Rate Last Dose  . 0.9 %  sodium chloride infusion   Intravenous Once Waldemar Dickens, MD      .  0.9 %  sodium chloride infusion   Intravenous Continuous Waldemar Dickens, MD 75 mL/hr at 03/01/15 1914 1,000 mL at 03/01/15 1914  . acetaminophen (TYLENOL) tablet 650 mg  650 mg Oral Q4H PRN Waldemar Dickens, MD      . albuterol (PROVENTIL) (2.5 MG/3ML) 0.083% nebulizer solution 2.5 mg  2.5 mg Nebulization Q6H PRN Waldemar Dickens, MD      . ALPRAZolam Duanne Moron) tablet 1 mg  1 mg Oral QHS Waldemar Dickens, MD   1 mg at 03/01/15 2231  . antiseptic oral rinse (CPC / CETYLPYRIDINIUM CHLORIDE 0.05%) solution 7 mL  7 mL Mouth Rinse BID Waldemar Dickens, MD   7 mL at 03/01/15 2200  .  aspirin EC tablet 81 mg  81 mg Oral Daily Waldemar Dickens, MD      . diltiazem (DILACOR XR) 24 hr capsule 240 mg  240 mg Oral Daily Waldemar Dickens, MD      . FLUoxetine (PROZAC) capsule 20 mg  20 mg Oral Daily Waldemar Dickens, MD      . furosemide (LASIX) tablet 40 mg  40 mg Oral Daily Waldemar Dickens, MD      . gi cocktail (Maalox,Lidocaine,Donnatal)  30 mL Oral QID PRN Waldemar Dickens, MD      . ondansetron [facility]) tablet 4 mg  4 mg Oral Q6H PRN Waldemar Dickens, MD       Or  . ondansetron [facility]) injection 4 mg  4 mg Intravenous Q6H PRN Waldemar Dickens, MD      . oxyCODONE (Oxy IR/ROXICODONE) immediate release tablet 5 mg  5 mg Oral Q4H PRN Waldemar Dickens, MD      . pantoprazole (PROTONIX) 80 mg in sodium chloride 0.9 % 250 mL (0.32 mg/mL) infusion  8 mg/hr Intravenous Continuous Waldemar Dickens, MD 25 mL/hr at 03/01/15 1915 8 mg/hr at 03/01/15 1915  . [START ON 03/05/2015] pantoprazole (PROTONIX) injection 40 mg  40 mg Intravenous Q12H Waldemar Dickens, MD      . risperiDONE (RISPERDAL) tablet 0.5 mg  0.5 mg Oral QHS Waldemar Dickens, MD   0.5 mg at 03/01/15 2231  . tiotropium (SPIRIVA) inhalation capsule 18 mcg  18 mcg Inhalation Daily Waldemar Dickens, MD   18 mcg at 03/01/15 1730    Allergies as of 03/01/2015  . (No Known Allergies)    Family History  Problem Relation Age of Onset  . Hypertension Mother   . Kidney failure Mother   . Cancer Father   . Asthma Father   . Heart failure Father   . Hypertension Father   . Colon cancer Father     diagnosed in his late 26, deceased from lung issues  . Colon cancer Brother     diagnosed in his late 51s, succumbed to the disease    History   Social History  . Marital Status: Single    Spouse Name: N/A  . Number of Children: N/A  . Years of Education: N/A   Occupational History  . Not on file.   Social History Main Topics  . Smoking status: Former Research scientist (life sciences)  . Smokeless tobacco: Never Used  . Alcohol Use: No  . Drug Use: No   . Sexual Activity: Not on file   Other Topics Concern  . Not on file   Social History Narrative    Review of Systems: Gen: see HPI CV: see HPI Resp: +DOE GI: see HPI GU :  Denies urinary burning, urinary frequency, urinary incontinence.  MS: "legs stay swollen all the time"  Derm: Denies rash, itching, dry skin Psych: Denies depression, anxiety,confusion, or memory loss Heme: see HPI  Physical Exam: Vital signs in last 24 hours: Temp:  [97.4 F (36.3 C)-98.3 F (36.8 C)] 97.4 F (36.3 C) (04/19 0555) Pulse Rate:  [65-85] 69 (04/19 0555) Resp:  [12-24] 20 (04/19 0555) BP: (91-152)/(46-75) 144/64 mmHg (04/19 0555) SpO2:  [100 %] 100 % (04/19 0555) Weight:  [162 lb 14.4 oz (73.891 kg)] 162 lb 14.4 oz (73.891 kg) (04/18 1802)   General:   Alert,  Well-developed, well-nourished, pleasant and cooperative in NAD Head:  Normocephalic and atraumatic. Eyes:  Sclera clear, no icterus.    Ears:  Normal auditory acuity. Nose:  No deformity, discharge,  or lesions. Mouth:  No deformity or lesions Lungs:  Scattered rhonchi, mild wheeze, diminished posterior bases Heart:  S1 S2 present without murmrus Abdomen:  Soft, nontender and nondistended. No masses, hepatosplenomegaly or hernias noted. Normal bowel sounds, without guarding, and without rebound.   Rectal:  Deferred until time of colonoscopy.   Msk:  Symmetrical without gross deformities. Normal posture. Extremities:  With 1+ pitting edema lower extremities  Neurologic:  Alert and  oriented x4;  grossly normal neurologically. Skin:  Intact without significant lesions or rashes. Psych:  Alert and cooperative. Normal mood and affect.  Intake/Output from previous day: 04/18 0701 - 04/19 0700 In: 1653 [I.V.:875; Blood:602; IV Piggyback:176] Out: 650 [Urine:650] Intake/Output this shift:    Lab Results:  Recent Labs  03/01/15 1400 03/02/15 0716  WBC 9.5 10.3  HGB 8.0* 10.3*  HCT 25.7* 33.5*  PLT 282 244    BMET  Recent Labs  03/01/15 1400 03/02/15 0716  NA 140 142  K 4.0 4.2  CL 93* 99  CO2 38* 36*  GLUCOSE 141* 102*  BUN 28* 25*  CREATININE 1.20* 1.05  CALCIUM 9.0 8.5   LFT  Recent Labs  03/01/15 1400  PROT 6.4  ALBUMIN 3.5  AST 18  ALT 18  ALKPHOS 80  BILITOT 0.3   PT/INR  Recent Labs  03/02/15 0716  LABPROT 13.7  INR 1.04   Lab Results  Component Value Date   IRON 63 03/01/2015   TIBC 326 03/01/2015   FERRITIN 14 03/01/2015     Studies/Results: Dg Chest Portable 1 View  03/01/2015   CLINICAL DATA:  Acute left-sided chest pain.  EXAM: PORTABLE CHEST - 1 VIEW  COMPARISON:  None.  FINDINGS: The heart size and mediastinal contours are within normal limits. Both lungs are clear. No pneumothorax or pleural effusion is noted. Hyperexpansion of the lungs is noted consistent with chronic obstructive pulmonary disease. The visualized skeletal structures are unremarkable.  IMPRESSION: Findings consistent with chronic obstructive pulmonary disease. No acute cardiopulmonary abnormality seen.   Electronically Signed   By: Marijo Conception, M.D.   On: 03/01/2015 14:27    Impression: 74 year old female admitted with evidence of IDA, obscure GI bleeding, heme positive stool. Reports of melena but takes iron daily; however, Hgb has been trending down over the past year but is actually stable on admission from Dec 2015. Ferritin has continued to drop. Overall clinical picture consistent with IDA. Symptomatic improvement with 2 units PRBCs, with Hgb appropriately improving to 10 range. Her family history is concerning due to 2 first-degree relatives (father and brother) both diagnosed with colon cancer in their late 81s. Patient has never had a colonoscopy. With reports of  possible melena, concern for upper GI etiology; however, could have a right-sided colon lesion or other source of bleeding in lower GI tract or small bowel. As she has never had any GI evaluation, recommend  colonoscopy/EGD with Dr. Gala Romney on 4/20. In interim, will start clear liquids, change Protonix drip to IV BID, and start prep this afternoon. As of note, only endorses baby aspirin, denying any other agents that would increase risk factors for GI bleeding.   Solid food dysphagia: query web, ring, stricture, uncontrolled GERD, doubt occult malignancy. Unable to exclude motility disorder. Dilation as appropriate at time of EGD.   Plan: Clear liquids PPI BID Colonoscopy/EGD+/- dilation with Dr. Gala Romney on 4/20 Golytely split prep dosing starting this afternoon Tap water enema X 2 in am Follow H/H   Orvil Feil, ANP-BC [facility] Gastroenterology     LOS: 1 day    03/02/2015, 8:29 AM

## 2015-03-02 NOTE — Progress Notes (Signed)
Subjective: She was admitted yesterday with GI bleeding. She has received 2 units of packed red blood cells. She generally feels better.  Objective: Vital signs in last 24 hours: Temp:  [97.4 F (36.3 C)-98.3 F (36.8 C)] 97.4 F (36.3 C) (04/19 0555) Pulse Rate:  [65-85] 69 (04/19 0555) Resp:  [12-24] 20 (04/19 0555) BP: (91-152)/(46-75) 144/64 mmHg (04/19 0555) SpO2:  [100 %] 100 % (04/19 0555) Weight:  [73.891 kg (162 lb 14.4 oz)] 73.891 kg (162 lb 14.4 oz) (04/18 1802) Weight change:     Intake/Output from previous day: 04/18 0701 - 04/19 0700 In: 1653 [I.V.:875; Blood:602; IV Piggyback:176] Out: 650 [Urine:650]  PHYSICAL EXAM General appearance: alert, cooperative and no distress Resp: rhonchi bilaterally Cardio: regular rate and rhythm, S1, S2 normal, no murmur, click, rub or gallop GI: soft, non-tender; bowel sounds normal; no masses,  no organomegaly Extremities: extremities normal, atraumatic, no cyanosis or edema  Lab Results:  Results for orders placed or performed during the hospital encounter of 03/01/15 (from the past 48 hour(s))  CBC with Differential/Platelet     Status: Abnormal   Collection Time: 03/01/15  2:00 PM  Result Value Ref Range   WBC 9.5 4.0 - 10.5 K/uL   RBC 2.71 (L) 3.87 - 5.11 MIL/uL   Hemoglobin 8.0 (L) 12.0 - 15.0 g/dL   HCT 25.7 (L) 36.0 - 46.0 %   MCV 94.8 78.0 - 100.0 fL   MCH 29.5 26.0 - 34.0 pg   MCHC 31.1 30.0 - 36.0 g/dL   RDW 17.1 (H) 11.5 - 15.5 %   Platelets 282 150 - 400 K/uL   Neutrophils Relative % 83 (H) 43 - 77 %   Neutro Abs 7.8 (H) 1.7 - 7.7 K/uL   Lymphocytes Relative 8 (L) 12 - 46 %   Lymphs Abs 0.8 0.7 - 4.0 K/uL   Monocytes Relative 8 3 - 12 %   Monocytes Absolute 0.8 0.1 - 1.0 K/uL   Eosinophils Relative 1 0 - 5 %   Eosinophils Absolute 0.1 0.0 - 0.7 K/uL   Basophils Relative 0 0 - 1 %   Basophils Absolute 0.0 0.0 - 0.1 K/uL  Comprehensive metabolic panel     Status: Abnormal   Collection Time: 03/01/15   2:00 PM  Result Value Ref Range   Sodium 140 135 - 145 mmol/L   Potassium 4.0 3.5 - 5.1 mmol/L   Chloride 93 (L) 96 - 112 mmol/L   CO2 38 (H) 19 - 32 mmol/L   Glucose, Bld 141 (H) 70 - 99 mg/dL   BUN 28 (H) 6 - 23 mg/dL   Creatinine, Ser 1.20 (H) 0.50 - 1.10 mg/dL   Calcium 9.0 8.4 - 10.5 mg/dL   Total Protein 6.4 6.0 - 8.3 g/dL   Albumin 3.5 3.5 - 5.2 g/dL   AST 18 0 - 37 U/L   ALT 18 0 - 35 U/L   Alkaline Phosphatase 80 39 - 117 U/L   Total Bilirubin 0.3 0.3 - 1.2 mg/dL   GFR calc non Af Amer 44 (L) >90 mL/min   GFR calc Af Amer 51 (L) >90 mL/min    Comment: (NOTE) The eGFR has been calculated using the CKD EPI equation. This calculation has not been validated in all clinical situations. eGFR's persistently <90 mL/min signify possible Chronic Kidney Disease.    Anion gap 9 5 - 15  Troponin I     Status: None   Collection Time: 03/01/15  2:00 PM  Result Value Ref Range   Troponin I <0.03 <0.031 ng/mL    Comment:        NO INDICATION OF MYOCARDIAL INJURY.   Brain natriuretic peptide     Status: None   Collection Time: 03/01/15  2:00 PM  Result Value Ref Range   B Natriuretic Peptide 85.0 0.0 - 100.0 pg/mL  POC occult blood, ED     Status: Abnormal   Collection Time: 03/01/15  3:51 PM  Result Value Ref Range   Fecal Occult Bld POSITIVE (A) NEGATIVE  Reticulocytes     Status: Abnormal   Collection Time: 03/01/15  4:34 PM  Result Value Ref Range   Retic Ct Pct 1.7 0.4 - 3.1 %   RBC. 2.97 (L) 3.87 - 5.11 MIL/uL   Retic Count, Manual 50.5 19.0 - 186.0 K/uL  Vitamin B12     Status: None   Collection Time: 03/01/15  6:18 PM  Result Value Ref Range   Vitamin B-12 663 211 - 911 pg/mL    Comment: Performed at [facility]  Folate     Status: None   Collection Time: 03/01/15  6:18 PM  Result Value Ref Range   Folate >20.0 ng/mL    Comment: (NOTE) Reference Ranges        Deficient:       0.4 - 3.3 ng/mL        Indeterminate:   3.4 - 5.4 ng/mL        Normal:               > 5.4 ng/mL Performed at [facility]   Iron and TIBC     Status: Abnormal   Collection Time: 03/01/15  6:18 PM  Result Value Ref Range   Iron 63 42 - 145 ug/dL   TIBC 326 250 - 470 ug/dL   Saturation Ratios 19 (L) 20 - 55 %   UIBC 263 125 - 400 ug/dL    Comment: Performed at [facility]  Ferritin     Status: None   Collection Time: 03/01/15  6:18 PM  Result Value Ref Range   Ferritin 14 10 - 291 ng/mL    Comment: Performed at [facility]  Troponin I-serum (0, 3, 6 hours)     Status: None   Collection Time: 03/01/15  6:18 PM  Result Value Ref Range   Troponin I <0.03 <0.031 ng/mL    Comment:        NO INDICATION OF MYOCARDIAL INJURY.   Prepare RBC     Status: None   Collection Time: 03/01/15  6:18 PM  Result Value Ref Range   Order Confirmation ORDER PROCESSED BY BLOOD BANK   Type and screen     Status: None (Preliminary result)   Collection Time: 03/01/15  6:18 PM  Result Value Ref Range   ABO/RH(D) O POS    Antibody Screen NEG    Sample Expiration 03/04/2015    Unit Number [id number]    Blood Component Type RED CELLS,LR    Unit division 00    Status of Unit ISSUED    Transfusion Status OK TO TRANSFUSE    Crossmatch Result Compatible    Unit Number [id number]    Blood Component Type RED CELLS,LR    Unit division 00    Status of Unit ISSUED    Transfusion Status OK TO TRANSFUSE    Crossmatch Result Compatible   ABO/Rh     Status: None  Collection Time: 03/01/15  6:18 PM  Result Value Ref Range   ABO/RH(D) O POS   Troponin I-serum (0, 3, 6 hours)     Status: None   Collection Time: 03/01/15  7:56 PM  Result Value Ref Range   Troponin I <0.03 <0.031 ng/mL    Comment:        NO INDICATION OF MYOCARDIAL INJURY.   Protime-INR     Status: None   Collection Time: 03/02/15  7:16 AM  Result Value Ref Range   Prothrombin Time 13.7 11.6 - 15.2 seconds   INR 1.04 0.00 - 1.49  APTT     Status: None   Collection Time:  03/02/15  7:16 AM  Result Value Ref Range   aPTT 30 24 - 37 seconds  CBC     Status: Abnormal   Collection Time: 03/02/15  7:16 AM  Result Value Ref Range   WBC 10.3 4.0 - 10.5 K/uL   RBC 3.56 (L) 3.87 - 5.11 MIL/uL   Hemoglobin 10.3 (L) 12.0 - 15.0 g/dL    Comment: DELTA CHECK NOTED   HCT 33.5 (L) 36.0 - 46.0 %   MCV 94.1 78.0 - 100.0 fL   MCH 28.9 26.0 - 34.0 pg   MCHC 30.7 30.0 - 36.0 g/dL   RDW 16.2 (H) 11.5 - 15.5 %   Platelets 244 150 - 400 K/uL  Basic metabolic panel     Status: Abnormal   Collection Time: 03/02/15  7:16 AM  Result Value Ref Range   Sodium 142 135 - 145 mmol/L   Potassium 4.2 3.5 - 5.1 mmol/L   Chloride 99 96 - 112 mmol/L   CO2 36 (H) 19 - 32 mmol/L   Glucose, Bld 102 (H) 70 - 99 mg/dL   BUN 25 (H) 6 - 23 mg/dL   Creatinine, Ser 1.05 0.50 - 1.10 mg/dL   Calcium 8.5 8.4 - 10.5 mg/dL   GFR calc non Af Amer 51 (L) >90 mL/min   GFR calc Af Amer 60 (L) >90 mL/min    Comment: (NOTE) The eGFR has been calculated using the CKD EPI equation. This calculation has not been validated in all clinical situations. eGFR's persistently <90 mL/min signify possible Chronic Kidney Disease.    Anion gap 7 5 - 15    ABGS No results for input(s): PHART, PO2ART, TCO2, HCO3 in the last 72 hours.  Invalid input(s): PCO2 CULTURES No results found for this or any previous visit (from the past 240 hour(s)). Studies/Results: Dg Chest Portable 1 View  03/01/2015   CLINICAL DATA:  Acute left-sided chest pain.  EXAM: PORTABLE CHEST - 1 VIEW  COMPARISON:  None.  FINDINGS: The heart size and mediastinal contours are within normal limits. Both lungs are clear. No pneumothorax or pleural effusion is noted. Hyperexpansion of the lungs is noted consistent with chronic obstructive pulmonary disease. The visualized skeletal structures are unremarkable.  IMPRESSION: Findings consistent with chronic obstructive pulmonary disease. No acute cardiopulmonary abnormality seen.   Electronically  Signed   By: Marijo Conception, M.D.   On: 03/01/2015 14:27    Medications:  Prior to Admission:  Prescriptions prior to admission  Medication Sig Dispense Refill Last Dose  . albuterol (PROVENTIL) (2.5 MG/3ML) 0.083% nebulizer solution Take 2.5 mg by nebulization every 6 (six) hours as needed for wheezing or shortness of breath.   03/01/2015 at Unknown time  . alendronate (FOSAMAX) 70 MG tablet Take 70 mg by mouth every Friday. Take with a  full glass of water on an empty stomach.   02/26/2015  . ALPRAZolam (XANAX) 1 MG tablet Take 1 mg by mouth at bedtime.   02/28/2015 at Unknown time  . aspirin EC 81 MG tablet Take 81 mg by mouth daily.   03/01/2015 at Unknown time  . diltiazem (DILACOR XR) 240 MG 24 hr capsule Take 240 mg by mouth daily.   03/01/2015 at Unknown time  . ferrous sulfate 325 (65 FE) MG tablet Take 325 mg by mouth daily with breakfast.   03/01/2015 at Unknown time  . FLUoxetine (PROZAC) 20 MG capsule Take 20 mg by mouth daily.   03/01/2015 at Unknown time  . fluticasone (FLONASE) 50 MCG/ACT nasal spray Place 2 sprays into both nostrils daily.   03/01/2015 at Unknown time  . fluticasone (FLONASE) 50 MCG/ACT nasal spray Place 2 sprays into both nostrils daily.   03/01/2015 at Unknown time  . furosemide (LASIX) 40 MG tablet Take 40 mg by mouth daily.   03/01/2015 at Unknown time  . risperiDONE (RISPERDAL) 0.5 MG tablet Take 0.5 mg by mouth at bedtime.   02/28/2015 at Unknown time  . tiotropium (SPIRIVA) 18 MCG inhalation capsule Place 18 mcg into inhaler and inhale daily.   03/01/2015 at Unknown time   Scheduled: . sodium chloride   Intravenous Once  . ALPRAZolam  1 mg Oral QHS  . antiseptic oral rinse  7 mL Mouth Rinse BID  . aspirin EC  81 mg Oral Daily  . diltiazem  240 mg Oral Daily  . FLUoxetine  20 mg Oral Daily  . furosemide  40 mg Oral Daily  . [START ON 03/05/2015] pantoprazole (PROTONIX) IV  40 mg Intravenous Q12H  . risperiDONE  0.5 mg Oral QHS  . tiotropium  18 mcg Inhalation  Daily   Continuous: . sodium chloride 1,000 mL (03/01/15 1914)  . pantoprozole (PROTONIX) infusion 8 mg/hr (03/01/15 1915)   YIF:OYDXAJOINOMVE, albuterol, gi cocktail, ondansetron **OR** ondansetron (ZOFRAN) IV, oxyCODONE  Assesment: She has GI bleeding. She is anemic from that. She has pretty severe COPD at baseline. She has received 2 units of packed red blood cells and feels better. Principal Problem:   GI bleed Active Problems:   Anemia   Chest pain   AKI (acute kidney injury)   Essential hypertension   COPD (chronic obstructive pulmonary disease)   Depression with anxiety   Insomnia    Plan: Continue treatments. GI consult today.    LOS: 1 day   April Pearson L 03/02/2015, 9:10 AM

## 2015-03-03 ENCOUNTER — Encounter (HOSPITAL_COMMUNITY)
Admission: EM | Disposition: A | Discharge status: Still In Hospital | Payer: Self-pay | Source: Home / Self Care | Attending: Emergency Medicine

## 2015-03-03 ENCOUNTER — Encounter (HOSPITAL_COMMUNITY): Payer: Self-pay | Admitting: *Deleted

## 2015-03-03 DIAGNOSIS — D123 Benign neoplasm of transverse colon: Secondary | ICD-10-CM | POA: Diagnosis not present

## 2015-03-03 DIAGNOSIS — D125 Benign neoplasm of sigmoid colon: Secondary | ICD-10-CM | POA: Diagnosis not present

## 2015-03-03 DIAGNOSIS — D122 Benign neoplasm of ascending colon: Secondary | ICD-10-CM | POA: Diagnosis not present

## 2015-03-03 DIAGNOSIS — D128 Benign neoplasm of rectum: Secondary | ICD-10-CM | POA: Diagnosis not present

## 2015-03-03 HISTORY — PX: MALONEY DILATION: SHX5535

## 2015-03-03 HISTORY — PX: ESOPHAGOGASTRODUODENOSCOPY: SHX5428

## 2015-03-03 HISTORY — PX: COLONOSCOPY: SHX5424

## 2015-03-03 LAB — TYPE AND SCREEN
ABO/RH(D): O POS
ANTIBODY SCREEN: NEGATIVE
UNIT DIVISION: 0
Unit division: 0

## 2015-03-03 SURGERY — COLONOSCOPY
Anesthesia: Moderate Sedation

## 2015-03-03 MED ORDER — ONDANSETRON HCL 4 MG/2ML IJ SOLN
INTRAMUSCULAR | Status: AC
  Filled 2015-03-03: qty 2

## 2015-03-03 MED ORDER — MEPERIDINE HCL 100 MG/ML IJ SOLN
INTRAMUSCULAR | Status: AC
  Filled 2015-03-03: qty 2

## 2015-03-03 MED ORDER — MIDAZOLAM HCL 5 MG/5ML IJ SOLN
INTRAMUSCULAR | Status: DC | PRN
  Administered 2015-03-03 (×2): 1 mg via INTRAVENOUS
  Administered 2015-03-03: 2 mg via INTRAVENOUS

## 2015-03-03 MED ORDER — ONDANSETRON HCL 4 MG/2ML IJ SOLN
INTRAMUSCULAR | Status: DC | PRN
  Administered 2015-03-03: 4 mg via INTRAVENOUS

## 2015-03-03 MED ORDER — STERILE WATER FOR IRRIGATION IR SOLN
Status: DC | PRN
  Administered 2015-03-03: 15:00:00

## 2015-03-03 MED ORDER — MIDAZOLAM HCL 5 MG/5ML IJ SOLN
INTRAMUSCULAR | Status: AC
  Filled 2015-03-03: qty 10

## 2015-03-03 MED ORDER — LIDOCAINE VISCOUS 2 % MT SOLN
OROMUCOSAL | Status: DC | PRN
  Administered 2015-03-03: 4 mL via OROMUCOSAL

## 2015-03-03 MED ORDER — LIDOCAINE VISCOUS 2 % MT SOLN
OROMUCOSAL | Status: AC
  Filled 2015-03-03: qty 15

## 2015-03-03 MED ORDER — MEPERIDINE HCL 100 MG/ML IJ SOLN
INTRAMUSCULAR | Status: DC | PRN
  Administered 2015-03-03: 50 mg via INTRAVENOUS
  Administered 2015-03-03: 25 mg via INTRAVENOUS

## 2015-03-03 NOTE — Op Note (Signed)
[facility] [address] [place], [address]   ENDOSCOPY PROCEDURE REPORT  PATIENT: April Pearson, April Pearson  MR#: [id number] BIRTHDATE: [birth year] , 35  yrs. old GENDER: female ENDOSCOPIST: R.  Garfield Cornea, MD FACP FACG REFERRED BY:  Sinda Du, M.D.  Barney Drain, M.D. PROCEDURE DATE:  Mar 19, 2015 PROCEDURE:  EGD, diagnostic and Maloney dilation of esophagus INDICATIONS:  Esophageal dysphagia; Hemoccult positive stool; iron deficiency anemia. MEDICATIONS: Versed 3 mg IV and Demerol study 5 mg IV in divided doses.  Xylocaine gel orally.  Zofran 4 mg IV. ASA CLASS:      Class III  CONSENT: The risks, benefits, limitations, alternatives and imponderables have been discussed.  The potential for biopsy, esophogeal dilation, etc. have also been reviewed.  Questions have been answered.  All parties agreeable.  Please see the history and physical in the medical record for more information.  DESCRIPTION OF PROCEDURE: After the risks benefits and alternatives of the procedure were thoroughly explained, informed consent was obtained.  The [id number] ([id number]) endoscope was introduced through the mouth and advanced to the second portion of the duodenum , lwithout limitation The instrument was slowly withdrawn as the mucosa was fully examined.    Noncritical. Schatzki's ring. In fact, patulous appearing EG junction.  Stomach empty. Small hiatal hernia. Normal gastric mucosa. Patent pylorus. Normal-appearing first and second portion of the duodenum.     Retroflexed views revealed as previously described.     The scope was then withdrawn from the patient and the procedure completed.  COMPLICATIONS: There were no immediate complications.  ENDOSCOPIC IMPRESSION: Noncritical. Schatzki's ring status post esophageal dilation as described above.  hiatal hernia; otherwise normal EGD  RECOMMENDATIONS: Continue twice a day PPI therapy for now. Would continue cautiously with  oral Fosamax. Swallowing precautions should be reviewed.   See colonoscopy report.  REPEAT EXAM:  eSigned:  R. Garfield Cornea, MD Rosalita Chessman [facility] Mar 19, 2015 3:46 PM    CC:  CPT CODES: ICD CODES:  The ICD and CPT codes recommended by this software are interpretations from the data that the clinical staff has captured with the software.  The verification of the translation of this report to the ICD and CPT codes and modifiers is the sole responsibility of the health care institution and practicing physician where this report was generated.  [place]. will not be held responsible for the validity of the ICD and CPT codes included on this report.  AMA assumes no liability for data contained or not contained herein. CPT is a Designer, television/film set of the [employer].  PATIENT NAME:  April Pearson, April Pearson MR#: [id number]

## 2015-03-03 NOTE — Op Note (Signed)
[facility] [address] [place], [address]   COLONOSCOPY PROCEDURE REPORT  PATIENT: April Pearson, April Pearson  MR#: [id number] BIRTHDATE: [birth year] , 59  yrs. old GENDER: female ENDOSCOPIST: R.  Garfield Cornea, MD FACP [facility] REFERRED FI:EPPIRJ Luan Pulling, M.D. PROCEDURE DATE:  10-Mar-2015 PROCEDURE:   Colonoscopy with snare polypectomy INDICATIONS:iron deficiency anemia; Hemoccult-positive stool; positive family history of colon cancer; no prior colonoscopy. MEDICATIONS: Versed 4 mg IV and Demerol 75 mg IV in divided doses. Zofran 4 mg IV. ASA CLASS:       Class III  CONSENT: The risks, benefits, alternatives and imponderables including but not limited to bleeding, perforation as well as the possibility of a missed lesion have been reviewed.  The potential for biopsy, lesion removal, etc. have also been discussed. Questions have been answered.  All parties agreeable.  Please see the history and physical in the medical record for more information.  DESCRIPTION OF PROCEDURE:   After the risks benefits and alternatives of the procedure were thoroughly explained, informed consent was obtained.  The digital rectal exam revealed no abnormalities of the rectum.   The [id number] ([id number])  endoscope was introduced through the anus and advanced to the cecum, which was identified by both the appendix and ileocecal valve. No adverse events experienced.   The quality of the prep was adequate  The instrument was then slowly withdrawn as the colon was fully examined.      COLON FINDINGS: (1) 59mm polyp in the rectum at 10 cm from the anal verge.  The patient had a 7 mm polyp in the sigmoid segment; (1) 7 mm polyp at the splenic flexure and (1) 6 millimeter polyp in the ascending segment.  The patient also had extensive left-sided diverticula; however, the remainder of the colonic mucosa appeared normal.  The above-mentioned polyps were hot or cold snare removed.      .  Withdrawal time=13 minutes 0 seconds.  The scope was withdrawn and the procedure completed. COMPLICATIONS: There were no immediate complications.  ENDOSCOPIC IMPRESSION: Extensive colonic diverticulosis. Multiple colonic polyps - removed as described above.. No overt neoplasm seen.    RECOMMENDATIONS:   Follow-up on pathology. Advance diet. See EGD report. Patient may benefit from an outpatient capsule study of small intestine to wrap up the GI evaluation. Recheck CBC tomorrow morning.  eSigned:  R. Garfield Cornea, MD Rosalita Chessman [facility] Mar 10, 2015 4:17 PM   cc:  CPT CODES: ICD CODES:  The ICD and CPT codes recommended by this software are interpretations from the data that the clinical staff has captured with the software.  The verification of the translation of this report to the ICD and CPT codes and modifiers is the sole responsibility of the health care institution and practicing physician where this report was generated.  [place]. will not be held responsible for the validity of the ICD and CPT codes included on this report.  AMA assumes no liability for data contained or not contained herein. CPT is a Designer, television/film set of the [employer].  PATIENT NAME:  April Pearson, April Pearson MR#: [id number]

## 2015-03-03 NOTE — Progress Notes (Signed)
Subjective: She says she feels okay. She has no new complaints. At her baseline she is significantly short of breath because of her severe COPD. She does require assistance with bathing and she generally uses a walker now. Her ability to ambulate and to move around in her assisted living facility is worse over the last 6 months I think because she's had several bouts of COPD exacerbation and probably had some lung damage. She was much worse with her shortness of breath when she became more anemic.  Objective: Vital signs in last 24 hours: Temp:  [97.4 F (36.3 C)-98.2 F (36.8 C)] 97.4 F (36.3 C) (04/20 0551) Pulse Rate:  [68-78] 78 (04/20 0551) Resp:  [18-20] 20 (04/20 0551) BP: (146-170)/(59-74) 152/74 mmHg (04/20 0551) SpO2:  [97 %-99 %] 97 % (04/20 0707) Weight change:  Last BM Date: 03/01/15  Intake/Output from previous day: 04/19 0701 - 04/20 0700 In: 992.1 [I.V.:992.1] Out: 2300 [Urine:2300]  PHYSICAL EXAM General appearance: alert, cooperative and moderate distress Resp: rhonchi bilaterally Cardio: regular rate and rhythm, S1, S2 normal, no murmur, click, rub or gallop GI: soft, non-tender; bowel sounds normal; no masses,  no organomegaly Extremities: extremities normal, atraumatic, no cyanosis or edema  Lab Results:  Results for orders placed or performed during the hospital encounter of 03/01/15 (from the past 48 hour(s))  CBC with Differential/Platelet     Status: Abnormal   Collection Time: 03/01/15  2:00 PM  Result Value Ref Range   WBC 9.5 4.0 - 10.5 K/uL   RBC 2.71 (L) 3.87 - 5.11 MIL/uL   Hemoglobin 8.0 (L) 12.0 - 15.0 g/dL   HCT 25.7 (L) 36.0 - 46.0 %   MCV 94.8 78.0 - 100.0 fL   MCH 29.5 26.0 - 34.0 pg   MCHC 31.1 30.0 - 36.0 g/dL   RDW 17.1 (H) 11.5 - 15.5 %   Platelets 282 150 - 400 K/uL   Neutrophils Relative % 83 (H) 43 - 77 %   Neutro Abs 7.8 (H) 1.7 - 7.7 K/uL   Lymphocytes Relative 8 (L) 12 - 46 %   Lymphs Abs 0.8 0.7 - 4.0 K/uL   Monocytes  Relative 8 3 - 12 %   Monocytes Absolute 0.8 0.1 - 1.0 K/uL   Eosinophils Relative 1 0 - 5 %   Eosinophils Absolute 0.1 0.0 - 0.7 K/uL   Basophils Relative 0 0 - 1 %   Basophils Absolute 0.0 0.0 - 0.1 K/uL  Comprehensive metabolic panel     Status: Abnormal   Collection Time: 03/01/15  2:00 PM  Result Value Ref Range   Sodium 140 135 - 145 mmol/L   Potassium 4.0 3.5 - 5.1 mmol/L   Chloride 93 (L) 96 - 112 mmol/L   CO2 38 (H) 19 - 32 mmol/L   Glucose, Bld 141 (H) 70 - 99 mg/dL   BUN 28 (H) 6 - 23 mg/dL   Creatinine, Ser 1.20 (H) 0.50 - 1.10 mg/dL   Calcium 9.0 8.4 - 10.5 mg/dL   Total Protein 6.4 6.0 - 8.3 g/dL   Albumin 3.5 3.5 - 5.2 g/dL   AST 18 0 - 37 U/L   ALT 18 0 - 35 U/L   Alkaline Phosphatase 80 39 - 117 U/L   Total Bilirubin 0.3 0.3 - 1.2 mg/dL   GFR calc non Af Amer 44 (L) >90 mL/min   GFR calc Af Amer 51 (L) >90 mL/min    Comment: (NOTE) The eGFR has been calculated using  the CKD EPI equation. This calculation has not been validated in all clinical situations. eGFR's persistently <90 mL/min signify possible Chronic Kidney Disease.    Anion gap 9 5 - 15  Troponin I     Status: None   Collection Time: 03/01/15  2:00 PM  Result Value Ref Range   Troponin I <0.03 <0.031 ng/mL    Comment:        NO INDICATION OF MYOCARDIAL INJURY.   Brain natriuretic peptide     Status: None   Collection Time: 03/01/15  2:00 PM  Result Value Ref Range   B Natriuretic Peptide 85.0 0.0 - 100.0 pg/mL  POC occult blood, ED     Status: Abnormal   Collection Time: 03/01/15  3:51 PM  Result Value Ref Range   Fecal Occult Bld POSITIVE (A) NEGATIVE  Reticulocytes     Status: Abnormal   Collection Time: 03/01/15  4:34 PM  Result Value Ref Range   Retic Ct Pct 1.7 0.4 - 3.1 %   RBC. 2.97 (L) 3.87 - 5.11 MIL/uL   Retic Count, Manual 50.5 19.0 - 186.0 K/uL  Vitamin B12     Status: None   Collection Time: 03/01/15  6:18 PM  Result Value Ref Range   Vitamin B-12 663 211 - 911 pg/mL     Comment: Performed at [facility]  Folate     Status: None   Collection Time: 03/01/15  6:18 PM  Result Value Ref Range   Folate >20.0 ng/mL    Comment: (NOTE) Reference Ranges        Deficient:       0.4 - 3.3 ng/mL        Indeterminate:   3.4 - 5.4 ng/mL        Normal:              > 5.4 ng/mL Performed at [facility]   Iron and TIBC     Status: Abnormal   Collection Time: 03/01/15  6:18 PM  Result Value Ref Range   Iron 63 42 - 145 ug/dL   TIBC 326 250 - 470 ug/dL   Saturation Ratios 19 (L) 20 - 55 %   UIBC 263 125 - 400 ug/dL    Comment: Performed at [facility]  Ferritin     Status: None   Collection Time: 03/01/15  6:18 PM  Result Value Ref Range   Ferritin 14 10 - 291 ng/mL    Comment: Performed at [facility]  Troponin I-serum (0, 3, 6 hours)     Status: None   Collection Time: 03/01/15  6:18 PM  Result Value Ref Range   Troponin I <0.03 <0.031 ng/mL    Comment:        NO INDICATION OF MYOCARDIAL INJURY.   Prepare RBC     Status: None   Collection Time: 03/01/15  6:18 PM  Result Value Ref Range   Order Confirmation ORDER PROCESSED BY BLOOD BANK   Type and screen     Status: None   Collection Time: 03/01/15  6:18 PM  Result Value Ref Range   ABO/RH(D) O POS    Antibody Screen NEG    Sample Expiration 03/04/2015    Unit Number [id number]    Blood Component Type RED CELLS,LR    Unit division 00    Status of Unit ISSUED,FINAL    Transfusion Status OK TO TRANSFUSE    Crossmatch Result Compatible    Unit  Number [id number]    Blood Component Type RED CELLS,LR    Unit division 00    Status of Unit ISSUED,FINAL    Transfusion Status OK TO TRANSFUSE    Crossmatch Result Compatible   ABO/Rh     Status: None   Collection Time: 03/01/15  6:18 PM  Result Value Ref Range   ABO/RH(D) O POS   Troponin I-serum (0, 3, 6 hours)     Status: None   Collection Time: 03/01/15  7:56 PM  Result Value Ref Range   Troponin I <0.03  <0.031 ng/mL    Comment:        NO INDICATION OF MYOCARDIAL INJURY.   Protime-INR     Status: None   Collection Time: 03/02/15  7:16 AM  Result Value Ref Range   Prothrombin Time 13.7 11.6 - 15.2 seconds   INR 1.04 0.00 - 1.49  APTT     Status: None   Collection Time: 03/02/15  7:16 AM  Result Value Ref Range   aPTT 30 24 - 37 seconds  CBC     Status: Abnormal   Collection Time: 03/02/15  7:16 AM  Result Value Ref Range   WBC 10.3 4.0 - 10.5 K/uL   RBC 3.56 (L) 3.87 - 5.11 MIL/uL   Hemoglobin 10.3 (L) 12.0 - 15.0 g/dL    Comment: DELTA CHECK NOTED   HCT 33.5 (L) 36.0 - 46.0 %   MCV 94.1 78.0 - 100.0 fL   MCH 28.9 26.0 - 34.0 pg   MCHC 30.7 30.0 - 36.0 g/dL   RDW 16.2 (H) 11.5 - 15.5 %   Platelets 244 150 - 400 K/uL  Basic metabolic panel     Status: Abnormal   Collection Time: 03/02/15  7:16 AM  Result Value Ref Range   Sodium 142 135 - 145 mmol/L   Potassium 4.2 3.5 - 5.1 mmol/L   Chloride 99 96 - 112 mmol/L   CO2 36 (H) 19 - 32 mmol/L   Glucose, Bld 102 (H) 70 - 99 mg/dL   BUN 25 (H) 6 - 23 mg/dL   Creatinine, Ser 1.05 0.50 - 1.10 mg/dL   Calcium 8.5 8.4 - 10.5 mg/dL   GFR calc non Af Amer 51 (L) >90 mL/min   GFR calc Af Amer 60 (L) >90 mL/min    Comment: (NOTE) The eGFR has been calculated using the CKD EPI equation. This calculation has not been validated in all clinical situations. eGFR's persistently <90 mL/min signify possible Chronic Kidney Disease.    Anion gap 7 5 - 15    ABGS No results for input(s): PHART, PO2ART, TCO2, HCO3 in the last 72 hours.  Invalid input(s): PCO2 CULTURES No results found for this or any previous visit (from the past 240 hour(s)). Studies/Results: Dg Chest Portable 1 View  03/01/2015   CLINICAL DATA:  Acute left-sided chest pain.  EXAM: PORTABLE CHEST - 1 VIEW  COMPARISON:  None.  FINDINGS: The heart size and mediastinal contours are within normal limits. Both lungs are clear. No pneumothorax or pleural effusion is noted.  Hyperexpansion of the lungs is noted consistent with chronic obstructive pulmonary disease. The visualized skeletal structures are unremarkable.  IMPRESSION: Findings consistent with chronic obstructive pulmonary disease. No acute cardiopulmonary abnormality seen.   Electronically Signed   By: Marijo Conception, M.D.   On: 03/01/2015 14:27    Medications:  Prior to Admission:  Prescriptions prior to admission  Medication Sig Dispense Refill Last Dose  .  albuterol (PROVENTIL) (2.5 MG/3ML) 0.083% nebulizer solution Take 2.5 mg by nebulization every 6 (six) hours as needed for wheezing or shortness of breath.   03/01/2015 at Unknown time  . alendronate (FOSAMAX) 70 MG tablet Take 70 mg by mouth every Friday. Take with a full glass of water on an empty stomach.   02/26/2015  . ALPRAZolam (XANAX) 1 MG tablet Take 1 mg by mouth at bedtime.   02/28/2015 at Unknown time  . aspirin EC 81 MG tablet Take 81 mg by mouth daily.   03/01/2015 at Unknown time  . diltiazem (DILACOR XR) 240 MG 24 hr capsule Take 240 mg by mouth daily.   03/01/2015 at Unknown time  . ferrous sulfate 325 (65 FE) MG tablet Take 325 mg by mouth daily with breakfast.   03/01/2015 at Unknown time  . FLUoxetine (PROZAC) 20 MG capsule Take 20 mg by mouth daily.   03/01/2015 at Unknown time  . fluticasone (FLONASE) 50 MCG/ACT nasal spray Place 2 sprays into both nostrils daily.   03/01/2015 at Unknown time  . fluticasone (FLONASE) 50 MCG/ACT nasal spray Place 2 sprays into both nostrils daily.   03/01/2015 at Unknown time  . furosemide (LASIX) 40 MG tablet Take 40 mg by mouth daily.   03/01/2015 at Unknown time  . risperiDONE (RISPERDAL) 0.5 MG tablet Take 0.5 mg by mouth at bedtime.   02/28/2015 at Unknown time  . tiotropium (SPIRIVA) 18 MCG inhalation capsule Place 18 mcg into inhaler and inhale daily.   03/01/2015 at Unknown time   Scheduled: . albuterol  2.5 mg Nebulization TID  . ALPRAZolam  1 mg Oral QHS  . antiseptic oral rinse  7 mL Mouth  Rinse BID  . aspirin EC  81 mg Oral Daily  . diltiazem  240 mg Oral Daily  . FLUoxetine  20 mg Oral Daily  . furosemide  40 mg Oral Daily  . pantoprazole  40 mg Oral BID AC  . risperiDONE  0.5 mg Oral QHS  . tiotropium  18 mcg Inhalation Daily  . tuberculin  5 Units Intradermal Once   Continuous: . sodium chloride 50 mL/hr at 03/03/15 0534  . sodium chloride     CLE:[id number], albuterol, gi cocktail, ondansetron **OR** ondansetron (ZOFRAN) IV, oxyCODONE  Assesment: She has a history of GI bleeding. She has COPD. She's better after blood transfusion I think. She had significant effect on her COPD from her anemia. She is set for endoscopy today. Principal Problem:   GI bleed Active Problems:   Anemia   Chest pain   AKI (acute kidney injury)   Essential hypertension   COPD (chronic obstructive pulmonary disease)   Depression with anxiety   Insomnia   Dysphagia, pharyngoesophageal phase    Plan: Endoscopy today    LOS: 2 days   Lamon Rotundo L 03/03/2015, 8:54 AM

## 2015-03-04 ENCOUNTER — Telehealth: Payer: Self-pay | Admitting: Gastroenterology

## 2015-03-04 ENCOUNTER — Encounter: Payer: Self-pay | Admitting: Internal Medicine

## 2015-03-04 DIAGNOSIS — D62 Acute posthemorrhagic anemia: Secondary | ICD-10-CM | POA: Diagnosis present

## 2015-03-04 DIAGNOSIS — Z86010 Personal history of colonic polyps: Secondary | ICD-10-CM

## 2015-03-04 DIAGNOSIS — K922 Gastrointestinal hemorrhage, unspecified: Secondary | ICD-10-CM | POA: Diagnosis not present

## 2015-03-04 DIAGNOSIS — Z8551 Personal history of malignant neoplasm of bladder: Secondary | ICD-10-CM

## 2015-03-04 LAB — CBC WITH DIFFERENTIAL/PLATELET
Basophils Absolute: 0 10*3/uL (ref 0.0–0.1)
Basophils Relative: 0 % (ref 0–1)
Eosinophils Absolute: 0.1 10*3/uL (ref 0.0–0.7)
Eosinophils Relative: 1 % (ref 0–5)
HCT: 32.9 % — ABNORMAL LOW (ref 36.0–46.0)
HEMOGLOBIN: 10.1 g/dL — AB (ref 12.0–15.0)
LYMPHS PCT: 4 % — AB (ref 12–46)
Lymphs Abs: 0.4 10*3/uL — ABNORMAL LOW (ref 0.7–4.0)
MCH: 29.6 pg (ref 26.0–34.0)
MCHC: 30.7 g/dL (ref 30.0–36.0)
MCV: 96.5 fL (ref 78.0–100.0)
MONO ABS: 0.9 10*3/uL (ref 0.1–1.0)
MONOS PCT: 9 % (ref 3–12)
NEUTROS ABS: 8.8 10*3/uL — AB (ref 1.7–7.7)
NEUTROS PCT: 86 % — AB (ref 43–77)
Platelets: 208 10*3/uL (ref 150–400)
RBC: 3.41 MIL/uL — ABNORMAL LOW (ref 3.87–5.11)
RDW: 16.3 % — ABNORMAL HIGH (ref 11.5–15.5)
WBC: 10.3 10*3/uL (ref 4.0–10.5)

## 2015-03-04 MED ORDER — PANTOPRAZOLE SODIUM 40 MG PO TBEC: 40.0000 mg | DELAYED_RELEASE_TABLET | Freq: Two times a day (BID) | ORAL | Status: AC

## 2015-03-04 NOTE — Progress Notes (Signed)
CSW facilitated discharge.  CSW notified Cheryln Manly with [facility] that patient was being discharge. Ms. Astrid Divine indicated that she could not pick patient up at this moment, but she would be able to pick her up after lunch.   CSW signing off.   Ambrose Pancoast, [place]

## 2015-03-04 NOTE — Telephone Encounter (Signed)
APPOINTMENT MADE AND LETTER SENT °

## 2015-03-04 NOTE — Progress Notes (Signed)
Subjective: She feels okay. She has no new complaints. The results of her endoscopy and colonoscopy are noted.  Objective: Vital signs in last 24 hours: Temp:  [97.7 F (36.5 C)-98.2 F (36.8 C)] 97.7 F (36.5 C) (04/21 0446) Pulse Rate:  [76-113] 113 (04/21 0446) Resp:  [15-32] 22 (04/21 0446) BP: (130-169)/(59-93) 160/61 mmHg (04/21 0446) SpO2:  [93 %-100 %] 94 % (04/21 0753) Weight change:  Last BM Date: 03/03/15  Intake/Output from previous day: 04/20 0701 - 04/21 0700 In: 1790 [P.O.:240; I.V.:1550] Out: 400 [Urine:400]  PHYSICAL EXAM General appearance: alert, cooperative and no distress Resp: rhonchi bilaterally Cardio: regular rate and rhythm, S1, S2 normal, no murmur, click, rub or gallop GI: soft, non-tender; bowel sounds normal; no masses,  no organomegaly Extremities: extremities normal, atraumatic, no cyanosis or edema  Lab Results:  Results for orders placed or performed during the hospital encounter of 03/01/15 (from the past 48 hour(s))  CBC with Differential/Platelet     Status: Abnormal   Collection Time: 03/04/15  5:34 AM  Result Value Ref Range   WBC 10.3 4.0 - 10.5 K/uL   RBC 3.41 (L) 3.87 - 5.11 MIL/uL   Hemoglobin 10.1 (L) 12.0 - 15.0 g/dL   HCT 32.9 (L) 36.0 - 46.0 %   MCV 96.5 78.0 - 100.0 fL   MCH 29.6 26.0 - 34.0 pg   MCHC 30.7 30.0 - 36.0 g/dL   RDW 16.3 (H) 11.5 - 15.5 %   Platelets 208 150 - 400 K/uL   Neutrophils Relative % 86 (H) 43 - 77 %   Neutro Abs 8.8 (H) 1.7 - 7.7 K/uL   Lymphocytes Relative 4 (L) 12 - 46 %   Lymphs Abs 0.4 (L) 0.7 - 4.0 K/uL   Monocytes Relative 9 3 - 12 %   Monocytes Absolute 0.9 0.1 - 1.0 K/uL   Eosinophils Relative 1 0 - 5 %   Eosinophils Absolute 0.1 0.0 - 0.7 K/uL   Basophils Relative 0 0 - 1 %   Basophils Absolute 0.0 0.0 - 0.1 K/uL    ABGS No results for input(s): PHART, PO2ART, TCO2, HCO3 in the last 72 hours.  Invalid input(s): PCO2 CULTURES No results found for this or any previous visit (from  the past 240 hour(s)). Studies/Results: No results found.  Medications:  Prior to Admission:  Prescriptions prior to admission  Medication Sig Dispense Refill Last Dose  . albuterol (PROVENTIL) (2.5 MG/3ML) 0.083% nebulizer solution Take 2.5 mg by nebulization every 6 (six) hours as needed for wheezing or shortness of breath.   03/01/2015 at Unknown time  . alendronate (FOSAMAX) 70 MG tablet Take 70 mg by mouth every Friday. Take with a full glass of water on an empty stomach.   02/26/2015  . ALPRAZolam (XANAX) 1 MG tablet Take 1 mg by mouth at bedtime.   02/28/2015 at Unknown time  . aspirin EC 81 MG tablet Take 81 mg by mouth daily.   03/01/2015 at Unknown time  . diltiazem (DILACOR XR) 240 MG 24 hr capsule Take 240 mg by mouth daily.   03/01/2015 at Unknown time  . ferrous sulfate 325 (65 FE) MG tablet Take 325 mg by mouth daily with breakfast.   03/01/2015 at Unknown time  . FLUoxetine (PROZAC) 20 MG capsule Take 20 mg by mouth daily.   03/01/2015 at Unknown time  . fluticasone (FLONASE) 50 MCG/ACT nasal spray Place 2 sprays into both nostrils daily.   03/01/2015 at Unknown time  . fluticasone (  FLONASE) 50 MCG/ACT nasal spray Place 2 sprays into both nostrils daily.   03/01/2015 at Unknown time  . furosemide (LASIX) 40 MG tablet Take 40 mg by mouth daily.   03/01/2015 at Unknown time  . risperiDONE (RISPERDAL) 0.5 MG tablet Take 0.5 mg by mouth at bedtime.   02/28/2015 at Unknown time  . tiotropium (SPIRIVA) 18 MCG inhalation capsule Place 18 mcg into inhaler and inhale daily.   03/01/2015 at Unknown time   Scheduled: . albuterol  2.5 mg Nebulization TID  . ALPRAZolam  1 mg Oral QHS  . antiseptic oral rinse  7 mL Mouth Rinse BID  . aspirin EC  81 mg Oral Daily  . diltiazem  240 mg Oral Daily  . FLUoxetine  20 mg Oral Daily  . furosemide  40 mg Oral Daily  . pantoprazole  40 mg Oral BID AC  . risperiDONE  0.5 mg Oral QHS  . tiotropium  18 mcg Inhalation Daily  . tuberculin  5 Units Intradermal  Once   Continuous: . sodium chloride 50 mL/hr at 03/03/15 2222   NTZ:[id number], albuterol, gi cocktail, ondansetron **OR** ondansetron (ZOFRAN) IV, oxyCODONE  Assesment: She had GI bleeding. She has acute blood loss anemia. She had acute kidney injury. She has a history of COPD and that's pretty stable now. She has a history of bladder cancer and that appears to be stable. Her hemoglobin level has remained stable and she is ready for discharge Principal Problem:   GI bleed Active Problems:   Anemia   Chest pain   AKI (acute kidney injury)   Essential hypertension   COPD (chronic obstructive pulmonary disease)   Depression with anxiety   Insomnia   Dysphagia, pharyngoesophageal phase    Plan: Discharge to assisted living facility    LOS: 3 days   Rayleen Wyrick L 03/04/2015, 8:52 AM

## 2015-03-04 NOTE — Telephone Encounter (Signed)
APPOINTMENT MADE AND LETTER WAS SENT

## 2015-03-04 NOTE — Progress Notes (Signed)
RN Janan Ridge phoned assisted living facility and gave report. IV and heart monitor removed. Pt will be leaving shortly with packet prepared by social worker

## 2015-03-04 NOTE — Telephone Encounter (Signed)
Please arrange hospital follow-up with our practice (Dr. Oneida Alar' patient) to discuss further work-up of anemia.

## 2015-03-04 NOTE — Progress Notes (Signed)
    Subjective: Denies abdominal pain, N/V. No overt GI bleeding. No dysphagia. Tolerating diet.   Objective: Vital signs in last 24 hours: Temp:  [97.7 F (36.5 C)-98.2 F (36.8 C)] 97.7 F (36.5 C) (04/21 0446) Pulse Rate:  [76-113] 113 (04/21 0446) Resp:  [15-32] 22 (04/21 0446) BP: (130-169)/(59-93) 160/61 mmHg (04/21 0446) SpO2:  [93 %-100 %] 98 % (04/21 0512) Last BM Date: 03/03/15 General:   Alert and oriented, pleasant Head:  Normocephalic and atraumatic. Abdomen:  Bowel sounds present, soft, non-tender, non-distended. No HSM or hernias noted. No rebound or guarding. No masses appreciated  Psych:  Alert and cooperative. Normal mood and affect.  Intake/Output from previous day: 04/20 0701 - 04/21 0700 In: 3716 [P.O.:240; I.V.:1550] Out: 400 [Urine:400] Intake/Output this shift:    Lab Results:  Recent Labs  03/01/15 1400 03/02/15 0716 03/04/15 0534  WBC 9.5 10.3 10.3  HGB 8.0* 10.3* 10.1*  HCT 25.7* 33.5* 32.9*  PLT 282 244 208   BMET  Recent Labs  03/01/15 1400 03/02/15 0716  NA 140 142  K 4.0 4.2  CL 93* 99  CO2 38* 36*  GLUCOSE 141* 102*  BUN 28* 25*  CREATININE 1.20* 1.05  CALCIUM 9.0 8.5   LFT  Recent Labs  03/01/15 1400  PROT 6.4  ALBUMIN 3.5  AST 18  ALT 18  ALKPHOS 80  BILITOT 0.3   PT/INR  Recent Labs  03/02/15 0716  LABPROT 13.7  INR 1.04   Assessment: 74 year old female with IDA, heme positive stool, EGD with Schatzki's ring s/p dilation otherwise normal and colonoscopy with multiple colonic polyps removed. May ultimately need outpatient capsule study of small bowel to complete GI evaluation. Hgb remains stable. No overt GI bleeding. Clinically improved and stable from a GI standpoint.    Plan: Follow-up on pathology PPI BID Outpatient appointment to discuss capsule study Will follow peripherally   Orvil Feil, ANP-BC [facility]      LOS: 3 days    03/04/2015, 7:49 AM    Attending  note:  Agree with plans for outpatient follow-up in our office (Dr. Oneida Alar) to consider further evaluation of anemia/bleeding.

## 2015-03-04 NOTE — Discharge Summary (Signed)
Physician Discharge Summary  Patient ID: TRACEE MCCREERY MRN: [id number] DOB/AGE: [birth year] 74 y.o. Primary Care Physician:No primary care provider on file. Admit date: 03/01/2015 Discharge date: 03/04/2015    Discharge Diagnoses:   Principal Problem:   GI bleed Active Problems:   Anemia   Chest pain   AKI (acute kidney injury)   Essential hypertension   COPD (chronic obstructive pulmonary disease)   Depression with anxiety   Insomnia   Dysphagia, pharyngoesophageal phase   Acute blood loss anemia   History of colonic polyps   Personal history of bladder cancer     Medication List    TAKE these medications        albuterol (2.5 MG/3ML) 0.083% nebulizer solution  Commonly known as:  PROVENTIL  Take 2.5 mg by nebulization every 6 (six) hours as needed for wheezing or shortness of breath.     alendronate 70 MG tablet  Commonly known as:  FOSAMAX  Take 70 mg by mouth every Friday. Take with a full glass of water on an empty stomach.     ALPRAZolam 1 MG tablet  Commonly known as:  XANAX  Take 1 mg by mouth at bedtime.     aspirin EC 81 MG tablet  Take 81 mg by mouth daily.     diltiazem 240 MG 24 hr capsule  Commonly known as:  DILACOR XR  Take 240 mg by mouth daily.     ferrous sulfate 325 (65 FE) MG tablet  Take 325 mg by mouth daily with breakfast.     FLUoxetine 20 MG capsule  Commonly known as:  PROZAC  Take 20 mg by mouth daily.     fluticasone 50 MCG/ACT nasal spray  Commonly known as:  FLONASE  Place 2 sprays into both nostrils daily.     furosemide 40 MG tablet  Commonly known as:  LASIX  Take 40 mg by mouth daily.     pantoprazole 40 MG tablet  Commonly known as:  PROTONIX  Take 1 tablet (40 mg total) by mouth 2 (two) times daily before a meal.     risperiDONE 0.5 MG tablet  Commonly known as:  RISPERDAL  Take 0.5 mg by mouth at bedtime.     tiotropium 18 MCG inhalation capsule  Commonly known as:  SPIRIVA  Place 18 mcg into inhaler  and inhale daily.        Discharged Condition: Improved    Consults: GI  Significant Diagnostic Studies: Dg Chest Portable 1 View  03/01/2015   CLINICAL DATA:  Acute left-sided chest pain.  EXAM: PORTABLE CHEST - 1 VIEW  COMPARISON:  None.  FINDINGS: The heart size and mediastinal contours are within normal limits. Both lungs are clear. No pneumothorax or pleural effusion is noted. Hyperexpansion of the lungs is noted consistent with chronic obstructive pulmonary disease. The visualized skeletal structures are unremarkable.  IMPRESSION: Findings consistent with chronic obstructive pulmonary disease. No acute cardiopulmonary abnormality seen.   Electronically Signed   By: Marijo Conception, M.D.   On: 03/01/2015 14:27    Lab Results: Basic Metabolic Panel:  Recent Labs  03/01/15 1400 03/02/15 0716  NA 140 142  K 4.0 4.2  CL 93* 99  CO2 38* 36*  GLUCOSE 141* 102*  BUN 28* 25*  CREATININE 1.20* 1.05  CALCIUM 9.0 8.5   Liver Function Tests:  Recent Labs  03/01/15 1400  AST 18  ALT 18  ALKPHOS 80  BILITOT 0.3  PROT 6.4  ALBUMIN  3.5     CBC:  Recent Labs  03/01/15 1400 03/02/15 0716 03/04/15 0534  WBC 9.5 10.3 10.3  NEUTROABS 7.8*  --  8.8*  HGB 8.0* 10.3* 10.1*  HCT 25.7* 33.5* 32.9*  MCV 94.8 94.1 96.5  PLT 282 244 208    No results found for this or any previous visit (from the past 240 hour(s)).   Hospital Course: This is a 74 year old who came to the emergency room with weakness and was found to be anemic with heme positive stool. She was evaluated and because of her comorbidities she was brought in the hospital and had blood transfusion. She seemed to have acute kidney injury which resolved with fluids and blood replacement. She had GI consultation and underwent upper endoscopy and colonoscopy. She was found to have colon polyps but did not have a definite bleeding cause found. Her hemoglobin stabilized and she remained around 10. She had no further  bleeding and was ready for discharge back to assisted living facility  Discharge Exam: Blood pressure 160/61, pulse 113, temperature 97.7 F (36.5 C), temperature source Oral, resp. rate 22, height 5' 6.5" (1.689 m), weight 73.891 kg (162 lb 14.4 oz), SpO2 94 %. She is awake and alert. Her chest is relatively clear. Her abdomen is soft  Disposition: Back to her assisted living facility      Discharge Instructions    Discharge patient    Complete by:  As directed              Signed: Davius Goudeau L   03/04/2015, 8:58 AM

## 2015-03-08 ENCOUNTER — Encounter: Payer: Self-pay | Admitting: Internal Medicine

## 2015-03-09 ENCOUNTER — Encounter (HOSPITAL_COMMUNITY): Payer: Self-pay | Admitting: Internal Medicine

## 2015-03-10 DIAGNOSIS — J449 Chronic obstructive pulmonary disease, unspecified: Secondary | ICD-10-CM | POA: Diagnosis not present

## 2015-03-13 ENCOUNTER — Emergency Department (HOSPITAL_COMMUNITY)
Admission: EM | Admit: 2015-03-13 | Discharge: 2015-03-13 | Disposition: A | Discharge status: Home Health | Payer: Medicare Other | Source: Home / Self Care | Attending: Emergency Medicine | Admitting: Emergency Medicine

## 2015-03-13 ENCOUNTER — Emergency Department (HOSPITAL_COMMUNITY): Payer: Medicare Other

## 2015-03-13 ENCOUNTER — Encounter (HOSPITAL_COMMUNITY): Payer: Self-pay | Admitting: Emergency Medicine

## 2015-03-13 DIAGNOSIS — E872 Acidosis: Secondary | ICD-10-CM

## 2015-03-13 DIAGNOSIS — Z7951 Long term (current) use of inhaled steroids: Secondary | ICD-10-CM | POA: Insufficient documentation

## 2015-03-13 DIAGNOSIS — R000 Tachycardia, unspecified: Secondary | ICD-10-CM | POA: Diagnosis not present

## 2015-03-13 DIAGNOSIS — I10 Essential (primary) hypertension: Secondary | ICD-10-CM | POA: Diagnosis not present

## 2015-03-13 DIAGNOSIS — Z8551 Personal history of malignant neoplasm of bladder: Secondary | ICD-10-CM | POA: Insufficient documentation

## 2015-03-13 DIAGNOSIS — K219 Gastro-esophageal reflux disease without esophagitis: Secondary | ICD-10-CM | POA: Diagnosis present

## 2015-03-13 DIAGNOSIS — Z8619 Personal history of other infectious and parasitic diseases: Secondary | ICD-10-CM

## 2015-03-13 DIAGNOSIS — F419 Anxiety disorder, unspecified: Secondary | ICD-10-CM

## 2015-03-13 DIAGNOSIS — J9622 Acute and chronic respiratory failure with hypercapnia: Secondary | ICD-10-CM | POA: Diagnosis present

## 2015-03-13 DIAGNOSIS — Z9981 Dependence on supplemental oxygen: Secondary | ICD-10-CM | POA: Diagnosis not present

## 2015-03-13 DIAGNOSIS — N179 Acute kidney failure, unspecified: Secondary | ICD-10-CM | POA: Diagnosis present

## 2015-03-13 DIAGNOSIS — Y95 Nosocomial condition: Secondary | ICD-10-CM | POA: Diagnosis present

## 2015-03-13 DIAGNOSIS — G9341 Metabolic encephalopathy: Secondary | ICD-10-CM | POA: Diagnosis not present

## 2015-03-13 DIAGNOSIS — R6520 Severe sepsis without septic shock: Secondary | ICD-10-CM | POA: Diagnosis present

## 2015-03-13 DIAGNOSIS — Z87891 Personal history of nicotine dependence: Secondary | ICD-10-CM | POA: Diagnosis not present

## 2015-03-13 DIAGNOSIS — E8729 Other acidosis: Secondary | ICD-10-CM

## 2015-03-13 DIAGNOSIS — Z8701 Personal history of pneumonia (recurrent): Secondary | ICD-10-CM | POA: Insufficient documentation

## 2015-03-13 DIAGNOSIS — J690 Pneumonitis due to inhalation of food and vomit: Secondary | ICD-10-CM | POA: Diagnosis not present

## 2015-03-13 DIAGNOSIS — M1990 Unspecified osteoarthritis, unspecified site: Secondary | ICD-10-CM | POA: Diagnosis present

## 2015-03-13 DIAGNOSIS — M810 Age-related osteoporosis without current pathological fracture: Secondary | ICD-10-CM | POA: Insufficient documentation

## 2015-03-13 DIAGNOSIS — R4182 Altered mental status, unspecified: Secondary | ICD-10-CM | POA: Diagnosis not present

## 2015-03-13 DIAGNOSIS — Z8249 Family history of ischemic heart disease and other diseases of the circulatory system: Secondary | ICD-10-CM

## 2015-03-13 DIAGNOSIS — J449 Chronic obstructive pulmonary disease, unspecified: Secondary | ICD-10-CM | POA: Diagnosis not present

## 2015-03-13 DIAGNOSIS — Z66 Do not resuscitate: Secondary | ICD-10-CM | POA: Diagnosis present

## 2015-03-13 DIAGNOSIS — S32010A Wedge compression fracture of first lumbar vertebra, initial encounter for closed fracture: Secondary | ICD-10-CM | POA: Diagnosis not present

## 2015-03-13 DIAGNOSIS — J9602 Acute respiratory failure with hypercapnia: Secondary | ICD-10-CM | POA: Diagnosis not present

## 2015-03-13 DIAGNOSIS — R079 Chest pain, unspecified: Secondary | ICD-10-CM | POA: Diagnosis not present

## 2015-03-13 DIAGNOSIS — D649 Anemia, unspecified: Secondary | ICD-10-CM | POA: Diagnosis present

## 2015-03-13 DIAGNOSIS — Z79891 Long term (current) use of opiate analgesic: Secondary | ICD-10-CM

## 2015-03-13 DIAGNOSIS — J441 Chronic obstructive pulmonary disease with (acute) exacerbation: Secondary | ICD-10-CM

## 2015-03-13 DIAGNOSIS — Z7952 Long term (current) use of systemic steroids: Secondary | ICD-10-CM

## 2015-03-13 DIAGNOSIS — Z7983 Long term (current) use of bisphosphonates: Secondary | ICD-10-CM | POA: Insufficient documentation

## 2015-03-13 DIAGNOSIS — F3181 Bipolar II disorder: Secondary | ICD-10-CM | POA: Diagnosis present

## 2015-03-13 DIAGNOSIS — R531 Weakness: Secondary | ICD-10-CM | POA: Insufficient documentation

## 2015-03-13 DIAGNOSIS — R404 Transient alteration of awareness: Secondary | ICD-10-CM | POA: Diagnosis not present

## 2015-03-13 DIAGNOSIS — Z8719 Personal history of other diseases of the digestive system: Secondary | ICD-10-CM | POA: Insufficient documentation

## 2015-03-13 DIAGNOSIS — A419 Sepsis, unspecified organism: Secondary | ICD-10-CM | POA: Diagnosis not present

## 2015-03-13 DIAGNOSIS — I700 Atherosclerosis of aorta: Secondary | ICD-10-CM | POA: Diagnosis not present

## 2015-03-13 DIAGNOSIS — Z7982 Long term (current) use of aspirin: Secondary | ICD-10-CM

## 2015-03-13 DIAGNOSIS — M545 Low back pain: Secondary | ICD-10-CM | POA: Insufficient documentation

## 2015-03-13 DIAGNOSIS — R918 Other nonspecific abnormal finding of lung field: Secondary | ICD-10-CM | POA: Diagnosis not present

## 2015-03-13 DIAGNOSIS — F319 Bipolar disorder, unspecified: Secondary | ICD-10-CM

## 2015-03-13 DIAGNOSIS — M549 Dorsalgia, unspecified: Secondary | ICD-10-CM

## 2015-03-13 LAB — CBC WITH DIFFERENTIAL/PLATELET
BASOS ABS: 0 10*3/uL (ref 0.0–0.1)
Basophils Relative: 0 % (ref 0–1)
EOS ABS: 0.3 10*3/uL (ref 0.0–0.7)
Eosinophils Relative: 3 % (ref 0–5)
HEMATOCRIT: 35.9 % — AB (ref 36.0–46.0)
Hemoglobin: 10.9 g/dL — ABNORMAL LOW (ref 12.0–15.0)
LYMPHS PCT: 5 % — AB (ref 12–46)
Lymphs Abs: 0.5 10*3/uL — ABNORMAL LOW (ref 0.7–4.0)
MCH: 29.1 pg (ref 26.0–34.0)
MCHC: 30.4 g/dL (ref 30.0–36.0)
MCV: 95.7 fL (ref 78.0–100.0)
Monocytes Absolute: 0.8 10*3/uL (ref 0.1–1.0)
Monocytes Relative: 8 % (ref 3–12)
NEUTROS ABS: 7.8 10*3/uL — AB (ref 1.7–7.7)
Neutrophils Relative %: 84 % — ABNORMAL HIGH (ref 43–77)
PLATELETS: 336 10*3/uL (ref 150–400)
RBC: 3.75 MIL/uL — AB (ref 3.87–5.11)
RDW: 16.4 % — ABNORMAL HIGH (ref 11.5–15.5)
WBC: 9.4 10*3/uL (ref 4.0–10.5)

## 2015-03-13 LAB — BASIC METABOLIC PANEL
Anion gap: 9 (ref 5–15)
BUN: 16 mg/dL (ref 6–23)
CO2: 42 mmol/L (ref 19–32)
Calcium: 9.3 mg/dL (ref 8.4–10.5)
Chloride: 94 mmol/L — ABNORMAL LOW (ref 96–112)
Creatinine, Ser: 1.17 mg/dL — ABNORMAL HIGH (ref 0.50–1.10)
GFR calc non Af Amer: 45 mL/min — ABNORMAL LOW (ref >90)
GFR, EST AFRICAN AMERICAN: 52 mL/min — AB (ref >90)
GLUCOSE: 121 mg/dL — AB (ref 70–99)
Potassium: 3.3 mmol/L — ABNORMAL LOW (ref 3.5–5.1)
Sodium: 145 mmol/L (ref 135–145)

## 2015-03-13 LAB — URINALYSIS, ROUTINE W REFLEX MICROSCOPIC
BILIRUBIN URINE: NEGATIVE
Glucose, UA: NEGATIVE mg/dL
Hgb urine dipstick: NEGATIVE
KETONES UR: NEGATIVE mg/dL
Leukocytes, UA: NEGATIVE
Nitrite: NEGATIVE
PH: 6 (ref 5.0–8.0)
Protein, ur: NEGATIVE mg/dL
Specific Gravity, Urine: 1.02 (ref 1.005–1.030)
Urobilinogen, UA: 0.2 mg/dL (ref 0.0–1.0)

## 2015-03-13 MED ORDER — HYDROCODONE-ACETAMINOPHEN 5-325 MG PO TABS
1.0000 | ORAL_TABLET | Freq: Once | ORAL | Status: AC
  Administered 2015-03-13: 1 via ORAL
  Filled 2015-03-13: qty 1

## 2015-03-13 MED ORDER — HYDROCODONE-ACETAMINOPHEN 5-325 MG PO TABS: 1.0000 | ORAL_TABLET | Freq: Four times a day (QID) | ORAL | Status: DC | PRN

## 2015-03-13 MED ORDER — POTASSIUM CHLORIDE CRYS ER 20 MEQ PO TBCR
40.0000 meq | EXTENDED_RELEASE_TABLET | Freq: Once | ORAL | Status: AC
  Administered 2015-03-13: 40 meq via ORAL
  Filled 2015-03-13: qty 2

## 2015-03-13 NOTE — ED Notes (Signed)
Nurse called facility and they are getting transportation for the patient.

## 2015-03-13 NOTE — ED Provider Notes (Signed)
CSN: [id number]     Arrival date & time 03/13/15  4193 History   First MD Initiated Contact with Patient 03/13/15 0802     Chief Complaint  Patient presents with  . Back Pain     (Consider location/radiation/quality/duration/timing/severity/associated sxs/prior Treatment) HPI Comments: Patient is a 74 year old female resident of the [facility] home who presents to the emergency department with complaint of severe back pain.  The history is obtained primarily from the patient, but some information was obtained from the caregiver brought the patient to the emergency department. The history at this point is that the patient is been having increasing back pain for about 2 days. It is of note that she has history of chronic low back pain. It is also of note that she has a history of bladder cancer. No reported recent fall or injury. The patient also complains of some shortness of breath. The patient has chronic obstructive pulmonary disease and is on home oxygen at 2 L/m on a 24-hour a day basis. There is no reported high fever, no hemoptysis. The patient denies any pain with urination. She denies seeing any blood in her urine recently. Patient has been given Tylenol for pain, but this is not helping.  The history is provided by the patient.    Past Medical History  Diagnosis Date  . COPD (chronic obstructive pulmonary disease)   . Osteoporosis   . Anxiety   . Weakness   . Tachycardia   . Cancer     bladder  . Depression   . Arthritis     KNEES AND HANDS  . Coarse tremors     TREMORS BOTH HANDS - PT STATES SIDE EFFECT OF HER MEDICATIONS FOR HER BREATHING  . Dizziness     sometimes  . Shortness of breath     USES OXYGEN 2 L / MIN NASAL CANNUA   24 HRS A DAY; [facility]ES IN [name]--CAN AMBULATE SHORT DISTANCE BUT ACTIVITIES VERY LIMITED BY SOB  . Anginal pain     "with fluttering, seen MD about it"  . History of shingles   . Emphysema   . GERD  (gastroesophageal reflux disease)     sometimes  . Laryngitis     10/2013, hx of  . Pneumonia     hx of  . Bipolar 1 disorder   . Cough 06/29/14    pt states recent cough / cold - feeling better - cough now non-productive.  . Frequent urination    Past Surgical History  Procedure Laterality Date  . Bladder surgery    . Cystoscopy w/ retrogrades Bilateral 06/19/2013    Procedure: CYSTOSCOPY WITH BIOSPY AND FULGERATION, BILATERAL RETROGRADE PYELOGRAM;  Surgeon: Malka So, MD;  Location: [facility];  Service: Urology;  Laterality: Bilateral;  . Breast surgery Left     FOR TUMOR - BENIGN  . Cataract extraction Bilateral 3 years ago  . Cystoscopy w/ retrogrades Bilateral 12/25/2013    Procedure: CYSTOSCOPY WITH BILATERAL RETROGRADE WITH BIOPSY WITH FULGERATION;  Surgeon: Irine Seal, MD;  Location: [facility];  Service: Urology;  Laterality: Bilateral;  . Transurethral resection of bladder tumor Bilateral 12/25/2013    Procedure: TRANSURETHRAL RESECTION OF BLADDER TUMOR (TURBT);  Surgeon: Irine Seal, MD;  Location: [facility];  Service: Urology;  Laterality: Bilateral;  . Cystoscopy with retrograde pyelogram, ureteroscopy and stent placement Bilateral 07/02/2014    Procedure: CYSTOSCOPY WITH BILATERAL RETROGRADE PYELOGRAM;  Surgeon: Malka So, MD;  Location:  [facility];  Service: Urology;  Laterality: Bilateral;  . Transurethral resection of bladder tumor N/A 07/02/2014    Procedure: TRANSURETHRAL RESECTION OF BLADDER TUMOR (TURBT);  Surgeon: Malka So, MD;  Location: [facility];  Service: Urology;  Laterality: N/A;   No family history on file. History  Substance Use Topics  . Smoking status: Former Smoker -- 2.00 packs/day for 40 years    Types: Cigarettes    Quit date: 11/13/2000  . Smokeless tobacco: Never Used     Comment: QUIT SMOKING YRS AGO  . Alcohol Use: No   OB History    No data available     Review of Systems  Respiratory: Positive for shortness of breath.   Musculoskeletal: Positive  for back pain and arthralgias.  Neurological: Positive for weakness.  Psychiatric/Behavioral: The patient is nervous/anxious.        Depression  All other systems reviewed and are negative.     Allergies  Review of patient's allergies indicates no known allergies.  Home Medications   Prior to Admission medications   Medication Sig Start Date End Date Taking? Authorizing Provider  acetaminophen (TYLENOL) 325 MG tablet Take 325 mg by mouth 4 (four) times daily as needed for mild pain or moderate pain.     Historical Provider, MD  albuterol (PROVENTIL) (2.5 MG/3ML) 0.083% nebulizer solution Take 2.5 mg by nebulization 3 (three) times daily.    Historical Provider, MD  alendronate (FOSAMAX) 70 MG tablet Take 70 mg by mouth every Friday. Take with a full glass of water on an empty stomach.    Historical Provider, MD  ALPRAZolam Duanne Moron) 1 MG tablet Take 1 mg by mouth at bedtime.    Historical Provider, MD  aspirin EC 81 MG tablet Take 81 mg by mouth daily.    Historical Provider, MD  budesonide-formoterol (SYMBICORT) 160-4.5 MCG/ACT inhaler Inhale 2 puffs into the lungs 2 (two) times daily.    Historical Provider, MD  calcium-vitamin D (OSCAL WITH D) 500-200 MG-UNIT per tablet Take 1 tablet by mouth 2 (two) times daily.    Historical Provider, MD  cetirizine (ZYRTEC) 10 MG tablet Take 10 mg by mouth at bedtime.    Historical Provider, MD  dextromethorphan-guaiFENesin (ROBITUSSIN-DM) 10-100 MG/5ML liquid Take 10 mLs by mouth every 4 (four) hours as needed for cough.    Historical Provider, MD  diltiazem (TIAZAC) 240 MG 24 hr capsule Take 240 mg by mouth every morning.     Historical Provider, MD  ferrous sulfate 325 (65 FE) MG tablet Take 325 mg by mouth daily.    Historical Provider, MD  FLUoxetine (PROZAC) 20 MG tablet Take 20 mg by mouth every morning.     Historical Provider, MD  fluticasone (FLONASE) 50 MCG/ACT nasal spray Place 2 sprays into the nose daily.    Historical Provider, MD   furosemide (LASIX) 40 MG tablet Take 1 tablet (40 mg total) by mouth every morning. As needed for edema 11/12/14   Sinda Du, MD  Multiple Vitamin (MULTIVITAMIN WITH MINERALS) TABS tablet Take 1 tablet by mouth daily.    Historical Provider, MD  predniSONE (DELTASONE) 50 MG tablet One tablet PO daily for 4 days 02/11/15   Ripley Fraise, MD  risperiDONE (RISPERDAL) 0.5 MG tablet Take 0.5 mg by mouth at bedtime.    Historical Provider, MD  tiotropium (SPIRIVA) 18 MCG inhalation capsule Place 18 mcg into inhaler and inhale every morning.    Historical Provider, MD   LMP  (LMP Unknown) Physical  Exam  Constitutional: She is oriented to person, place, and time. She appears well-developed and well-nourished.  Non-toxic appearance.  HENT:  Head: Normocephalic.  Right Ear: Tympanic membrane and external ear normal.  Left Ear: Tympanic membrane and external ear normal.  Eyes: EOM and lids are normal. Pupils are equal, round, and reactive to light.  Neck: Normal range of motion. Neck supple. Carotid bruit is not present.  Cardiovascular: Normal rate, regular rhythm, normal heart sounds, intact distal pulses and normal pulses.   Pulmonary/Chest: Breath sounds normal. No respiratory distress.  There are decreased breath sounds present bilaterally. There are some scattered rhonchi. No wheezing noted. Symmetrical rise and fall of the chest. The patient speaks in complete sentences.  Abdominal: Soft. Bowel sounds are normal. There is no tenderness. There is no guarding.  Musculoskeletal: Normal range of motion.  There is bony tenderness of the lumbar region. There is paraspinal tenderness to palpation and with change of position. There no hot areas appreciated. There is no palpable step off of the lumbar area.  Lymphadenopathy:       Head (right side): No submandibular adenopathy present.       Head (left side): No submandibular adenopathy present.    She has no cervical adenopathy.  Neurological:  She is alert and oriented to person, place, and time. She has normal strength. No cranial nerve deficit or sensory deficit.  Skin: Skin is warm and dry.  Psychiatric: She has a normal mood and affect. Her speech is normal.  Nursing note and vitals reviewed.   ED Course  Procedures (including critical care time) Labs Review Labs Reviewed - No data to display  Imaging Review No results found.   EKG Interpretation None      MDM  Vital signs are well within normal limits. Pulse oximetry on 2 L of oxygen is 95%.  Urinalysis is negative for evidence of infection or kidney stone. Complete blood count shows the hemoglobin and hematocrit to be slightly low, otherwise the complete blood count is nonacute. Basic metabolic panel shows potassium to be 3.3. The patient was given oral potassium for this. The CO2 was critically elevated at 42. It is of note that the patient had a similar reading of 41 on May 31 of 2016. The patient is awake and alert, and in no distress. She has a history of COPD.  Chest x-ray shows no edema or consolidation. There is noted emphysematous changes present. X-ray of the lumbar spine shows anterior wedging of the L1 vertebral body consistent with a compression fracture. This is not new. The patient has extensive disc space narrowing changes at the T12-L1, L1-L2, and L2-L3 areas. No new fracture is appreciated. There is significant osteoarthritis changes present.  Patient treated in the emergency department with hydrocodone with some improvement in her pain. Will order Norco every 6 hours for the weekend. Will defer to Dr. Luan Pulling for additional evaluation and management at the nursing facility.    Final diagnoses:  None    *I have reviewed nursing notes, vital signs, and all appropriate lab and imaging results for this patient.Lily Kocher, PA-C 03/13/15 [place], MD 03/16/15 [phone]

## 2015-03-13 NOTE — ED Notes (Signed)
Lab at bedside

## 2015-03-13 NOTE — ED Notes (Addendum)
Pt from [place]. She arrived with a caregiver from the facility. Per pt she has been having severe back pain. She has been having back pain for about 2 days- it is generalized lower back pain that she rates 10/10. She has a history of bladder cancer. Pt had a breathing treatment prior to coming to the ED. At home her oxygen is at 2 L. The number for the facility is [phone]. PT c/o increasing SOB today as well.

## 2015-03-13 NOTE — ED Notes (Signed)
Critical Value Read back from lab:  Venous CO2 : 42  MD Nanavati made aware.

## 2015-03-13 NOTE — ED Notes (Signed)
Pt states that she had a colonscopy around a week ago and hasn't made a bowel movement since. Pt states her back pain is localized to her lower back and started 2 days ago.    Requested that pt give [place] a urine specimen when she can.

## 2015-03-13 NOTE — Discharge Instructions (Signed)
X-ray of the lower back shows there is no change in the compression fracture at the L1 area. There is extensive osteoarthritis changes of the back, and some disc disease changes of the lower back. Please use Tylenol for mild pain, use Norco for more severe pain. Please have Dr. Luan Pulling evaluate the pain management.  On March 31 the carbon dioxide level was elevated, it is elevated again today. Please ask Dr. Luan Pulling to evaluate the oxygen levels, and follow-up on this as sone as possible. Musculoskeletal Pain Musculoskeletal pain is muscle and boney aches and pains. These pains can occur in any part of the body. Your caregiver may treat you without knowing the cause of the pain. They may treat you if blood or urine tests, X-rays, and other tests were normal.  CAUSES There is often not a definite cause or reason for these pains. These pains may be caused by a type of germ (virus). The discomfort may also come from overuse. Overuse includes working out too hard when your body is not fit. Boney aches also come from weather changes. Bone is sensitive to atmospheric pressure changes. HOME CARE INSTRUCTIONS   Ask when your test results will be ready. Make sure you get your test results.  Only take over-the-counter or prescription medicines for pain, discomfort, or fever as directed by your caregiver. If you were given medications for your condition, do not drive, operate machinery or power tools, or sign legal documents for 24 hours. Do not drink alcohol. Do not take sleeping pills or other medications that may interfere with treatment.  Continue all activities unless the activities cause more pain. When the pain lessens, slowly resume normal activities. Gradually increase the intensity and duration of the activities or exercise.  During periods of severe pain, bed rest may be helpful. Lay or sit in any position that is comfortable.  Putting ice on the injured area.  Put ice in a bag.  Place a towel  between your skin and the bag.  Leave the ice on for 15 to 20 minutes, 3 to 4 times a day.  Follow up with your caregiver for continued problems and no reason can be found for the pain. If the pain becomes worse or does not go away, it may be necessary to repeat tests or do additional testing. Your caregiver may need to look further for a possible cause. SEEK IMMEDIATE MEDICAL CARE IF:  You have pain that is getting worse and is not relieved by medications.  You develop chest pain that is associated with shortness or breath, sweating, feeling sick to your stomach (nauseous), or throw up (vomit).  Your pain becomes localized to the abdomen.  You develop any new symptoms that seem different or that concern you. MAKE SURE YOU:   Understand these instructions.  Will watch your condition.  Will get help right away if you are not doing well or get worse. Document Released: 10/30/2005 Document Revised: 01/22/2012 Document Reviewed: 07/04/2013 [facility] Patient Information 2015 [place], [place]. This information is not intended to replace advice given to you by your health care provider. Make sure you discuss any questions you have with your health care provider.

## 2015-03-14 ENCOUNTER — Inpatient Hospital Stay (HOSPITAL_COMMUNITY): Payer: Medicare Other

## 2015-03-14 ENCOUNTER — Other Ambulatory Visit: Payer: Self-pay

## 2015-03-14 ENCOUNTER — Encounter (HOSPITAL_COMMUNITY): Payer: Self-pay | Admitting: Cardiology

## 2015-03-14 ENCOUNTER — Emergency Department (HOSPITAL_COMMUNITY): Payer: Medicare Other

## 2015-03-14 ENCOUNTER — Encounter (HOSPITAL_COMMUNITY): Payer: Medicare Other

## 2015-03-14 ENCOUNTER — Inpatient Hospital Stay (HOSPITAL_COMMUNITY)
Admission: EM | Admit: 2015-03-14 | Discharge: 2015-03-22 | DRG: 871 | Disposition: A | Discharge status: Skilled Nursing Facility | Payer: Medicare Other | Attending: Pulmonary Disease | Admitting: Pulmonary Disease

## 2015-03-14 DIAGNOSIS — R278 Other lack of coordination: Secondary | ICD-10-CM | POA: Diagnosis not present

## 2015-03-14 DIAGNOSIS — Y95 Nosocomial condition: Secondary | ICD-10-CM | POA: Diagnosis present

## 2015-03-14 DIAGNOSIS — R000 Tachycardia, unspecified: Secondary | ICD-10-CM | POA: Diagnosis not present

## 2015-03-14 DIAGNOSIS — R6520 Severe sepsis without septic shock: Secondary | ICD-10-CM | POA: Diagnosis present

## 2015-03-14 DIAGNOSIS — Z8719 Personal history of other diseases of the digestive system: Secondary | ICD-10-CM | POA: Diagnosis not present

## 2015-03-14 DIAGNOSIS — J189 Pneumonia, unspecified organism: Secondary | ICD-10-CM | POA: Diagnosis present

## 2015-03-14 DIAGNOSIS — J9602 Acute respiratory failure with hypercapnia: Secondary | ICD-10-CM | POA: Diagnosis present

## 2015-03-14 DIAGNOSIS — J440 Chronic obstructive pulmonary disease with acute lower respiratory infection: Secondary | ICD-10-CM | POA: Diagnosis not present

## 2015-03-14 DIAGNOSIS — J441 Chronic obstructive pulmonary disease with (acute) exacerbation: Secondary | ICD-10-CM

## 2015-03-14 DIAGNOSIS — A419 Sepsis, unspecified organism: Secondary | ICD-10-CM | POA: Diagnosis present

## 2015-03-14 DIAGNOSIS — R531 Weakness: Secondary | ICD-10-CM | POA: Diagnosis not present

## 2015-03-14 DIAGNOSIS — R404 Transient alteration of awareness: Secondary | ICD-10-CM

## 2015-03-14 DIAGNOSIS — J9691 Respiratory failure, unspecified with hypoxia: Secondary | ICD-10-CM | POA: Diagnosis not present

## 2015-03-14 DIAGNOSIS — M810 Age-related osteoporosis without current pathological fracture: Secondary | ICD-10-CM | POA: Diagnosis present

## 2015-03-14 DIAGNOSIS — I10 Essential (primary) hypertension: Secondary | ICD-10-CM | POA: Diagnosis not present

## 2015-03-14 DIAGNOSIS — G9340 Encephalopathy, unspecified: Secondary | ICD-10-CM | POA: Diagnosis present

## 2015-03-14 DIAGNOSIS — I700 Atherosclerosis of aorta: Secondary | ICD-10-CM | POA: Diagnosis not present

## 2015-03-14 DIAGNOSIS — Z8551 Personal history of malignant neoplasm of bladder: Secondary | ICD-10-CM | POA: Diagnosis not present

## 2015-03-14 DIAGNOSIS — D649 Anemia, unspecified: Secondary | ICD-10-CM | POA: Diagnosis not present

## 2015-03-14 DIAGNOSIS — M1990 Unspecified osteoarthritis, unspecified site: Secondary | ICD-10-CM | POA: Diagnosis present

## 2015-03-14 DIAGNOSIS — J449 Chronic obstructive pulmonary disease, unspecified: Secondary | ICD-10-CM | POA: Diagnosis present

## 2015-03-14 DIAGNOSIS — Z8701 Personal history of pneumonia (recurrent): Secondary | ICD-10-CM | POA: Diagnosis not present

## 2015-03-14 DIAGNOSIS — N289 Disorder of kidney and ureter, unspecified: Secondary | ICD-10-CM

## 2015-03-14 DIAGNOSIS — Z87891 Personal history of nicotine dependence: Secondary | ICD-10-CM | POA: Diagnosis not present

## 2015-03-14 DIAGNOSIS — Z7982 Long term (current) use of aspirin: Secondary | ICD-10-CM | POA: Diagnosis not present

## 2015-03-14 DIAGNOSIS — J690 Pneumonitis due to inhalation of food and vomit: Secondary | ICD-10-CM | POA: Diagnosis not present

## 2015-03-14 DIAGNOSIS — N179 Acute kidney failure, unspecified: Secondary | ICD-10-CM | POA: Diagnosis not present

## 2015-03-14 DIAGNOSIS — R4182 Altered mental status, unspecified: Secondary | ICD-10-CM | POA: Diagnosis present

## 2015-03-14 DIAGNOSIS — Z9981 Dependence on supplemental oxygen: Secondary | ICD-10-CM | POA: Diagnosis not present

## 2015-03-14 DIAGNOSIS — J9622 Acute and chronic respiratory failure with hypercapnia: Secondary | ICD-10-CM | POA: Diagnosis present

## 2015-03-14 DIAGNOSIS — F3181 Bipolar II disorder: Secondary | ICD-10-CM | POA: Diagnosis present

## 2015-03-14 DIAGNOSIS — F22 Delusional disorders: Secondary | ICD-10-CM

## 2015-03-14 DIAGNOSIS — R488 Other symbolic dysfunctions: Secondary | ICD-10-CM | POA: Diagnosis not present

## 2015-03-14 DIAGNOSIS — K219 Gastro-esophageal reflux disease without esophagitis: Secondary | ICD-10-CM | POA: Diagnosis present

## 2015-03-14 DIAGNOSIS — R262 Difficulty in walking, not elsewhere classified: Secondary | ICD-10-CM | POA: Diagnosis not present

## 2015-03-14 DIAGNOSIS — J9692 Respiratory failure, unspecified with hypercapnia: Secondary | ICD-10-CM | POA: Insufficient documentation

## 2015-03-14 DIAGNOSIS — Z7983 Long term (current) use of bisphosphonates: Secondary | ICD-10-CM | POA: Diagnosis not present

## 2015-03-14 DIAGNOSIS — Z8249 Family history of ischemic heart disease and other diseases of the circulatory system: Secondary | ICD-10-CM | POA: Diagnosis not present

## 2015-03-14 DIAGNOSIS — D72829 Elevated white blood cell count, unspecified: Secondary | ICD-10-CM

## 2015-03-14 DIAGNOSIS — Z452 Encounter for adjustment and management of vascular access device: Secondary | ICD-10-CM | POA: Diagnosis not present

## 2015-03-14 DIAGNOSIS — Z8619 Personal history of other infectious and parasitic diseases: Secondary | ICD-10-CM | POA: Diagnosis not present

## 2015-03-14 DIAGNOSIS — M6281 Muscle weakness (generalized): Secondary | ICD-10-CM | POA: Diagnosis not present

## 2015-03-14 DIAGNOSIS — Z79891 Long term (current) use of opiate analgesic: Secondary | ICD-10-CM | POA: Diagnosis not present

## 2015-03-14 DIAGNOSIS — G9341 Metabolic encephalopathy: Secondary | ICD-10-CM | POA: Diagnosis present

## 2015-03-14 DIAGNOSIS — Z7952 Long term (current) use of systemic steroids: Secondary | ICD-10-CM | POA: Diagnosis not present

## 2015-03-14 DIAGNOSIS — R918 Other nonspecific abnormal finding of lung field: Secondary | ICD-10-CM | POA: Diagnosis not present

## 2015-03-14 DIAGNOSIS — T82898A Other specified complication of vascular prosthetic devices, implants and grafts, initial encounter: Secondary | ICD-10-CM

## 2015-03-14 DIAGNOSIS — Z7951 Long term (current) use of inhaled steroids: Secondary | ICD-10-CM | POA: Diagnosis not present

## 2015-03-14 DIAGNOSIS — Z66 Do not resuscitate: Secondary | ICD-10-CM | POA: Diagnosis present

## 2015-03-14 DIAGNOSIS — F319 Bipolar disorder, unspecified: Secondary | ICD-10-CM | POA: Diagnosis not present

## 2015-03-14 LAB — CBC WITH DIFFERENTIAL/PLATELET
BASOS PCT: 0 % (ref 0–1)
Basophils Absolute: 0 10*3/uL (ref 0.0–0.1)
EOS PCT: 0 % (ref 0–5)
Eosinophils Absolute: 0 10*3/uL (ref 0.0–0.7)
HCT: 34.9 % — ABNORMAL LOW (ref 36.0–46.0)
HEMOGLOBIN: 10.5 g/dL — AB (ref 12.0–15.0)
LYMPHS PCT: 3 % — AB (ref 12–46)
Lymphs Abs: 0.9 10*3/uL (ref 0.7–4.0)
MCH: 29.2 pg (ref 26.0–34.0)
MCHC: 30.1 g/dL (ref 30.0–36.0)
MCV: 96.9 fL (ref 78.0–100.0)
Monocytes Absolute: 2.8 10*3/uL — ABNORMAL HIGH (ref 0.1–1.0)
Monocytes Relative: 9 % (ref 3–12)
NEUTROS PCT: 88 % — AB (ref 43–77)
Neutro Abs: 27.1 10*3/uL — ABNORMAL HIGH (ref 1.7–7.7)
Platelets: 436 10*3/uL — ABNORMAL HIGH (ref 150–400)
RBC: 3.6 MIL/uL — AB (ref 3.87–5.11)
RDW: 16.2 % — ABNORMAL HIGH (ref 11.5–15.5)
WBC: 30.8 10*3/uL — ABNORMAL HIGH (ref 4.0–10.5)

## 2015-03-14 LAB — URINALYSIS, ROUTINE W REFLEX MICROSCOPIC
Glucose, UA: NEGATIVE mg/dL
Hgb urine dipstick: NEGATIVE
Leukocytes, UA: NEGATIVE
NITRITE: NEGATIVE
Protein, ur: 100 mg/dL — AB
UROBILINOGEN UA: 0.2 mg/dL (ref 0.0–1.0)
pH: 5 (ref 5.0–8.0)

## 2015-03-14 LAB — BLOOD GAS, ARTERIAL
ACID-BASE EXCESS: 10.4 mmol/L — AB (ref 0.0–2.0)
Acid-Base Excess: 6.6 mmol/L — ABNORMAL HIGH (ref 0.0–2.0)
BICARBONATE: 33.7 meq/L — AB (ref 20.0–24.0)
Bicarbonate: 37.8 mEq/L — ABNORMAL HIGH (ref 20.0–24.0)
Delivery systems: POSITIVE
Drawn by: 22179
Drawn by: 27407
Expiratory PAP: 8
FIO2: 0.45 %
Inspiratory PAP: 16
O2 Content: 8 L/min
O2 Saturation: 94.4 %
O2 Saturation: 97 %
PATIENT TEMPERATURE: 37
PCO2 ART: 81 mmHg — AB (ref 35.0–45.0)
PO2 ART: 84.9 mmHg (ref 80.0–100.0)
PO2 ART: 103 mmHg — AB (ref 80.0–100.0)
Patient temperature: 37
TCO2: 36.2 mmol/L (ref 0–100)
TCO2: 32.5 mmol/L (ref 0–100)
pCO2 arterial: 91.5 mmHg (ref 35.0–45.0)
pH, Arterial: 7.24 — ABNORMAL LOW (ref 7.350–7.450)
pH, Arterial: 7.242 — ABNORMAL LOW (ref 7.350–7.450)

## 2015-03-14 LAB — BASIC METABOLIC PANEL
ANION GAP: 9 (ref 5–15)
BUN: 22 mg/dL — ABNORMAL HIGH (ref 6–20)
CO2: 36 mmol/L — AB (ref 22–32)
CREATININE: 1.56 mg/dL — AB (ref 0.44–1.00)
Calcium: 9.3 mg/dL (ref 8.9–10.3)
Chloride: 95 mmol/L — ABNORMAL LOW (ref 101–111)
GFR, EST AFRICAN AMERICAN: 37 mL/min — AB (ref >60)
GFR, EST NON AFRICAN AMERICAN: 32 mL/min — AB (ref >60)
Glucose, Bld: 129 mg/dL — ABNORMAL HIGH (ref 70–99)
Potassium: 4.3 mmol/L (ref 3.5–5.1)
SODIUM: 140 mmol/L (ref 135–145)

## 2015-03-14 LAB — PROTIME-INR
INR: 1.06 (ref 0.00–1.49)
Prothrombin Time: 13.9 seconds (ref 11.6–15.2)

## 2015-03-14 LAB — URINE MICROSCOPIC-ADD ON

## 2015-03-14 LAB — GLUCOSE, CAPILLARY
GLUCOSE-CAPILLARY: 141 mg/dL — AB (ref 70–99)
Glucose-Capillary: 153 mg/dL — ABNORMAL HIGH (ref 70–99)
Glucose-Capillary: 120 mg/dL — ABNORMAL HIGH (ref 70–99)

## 2015-03-14 LAB — I-STAT TROPONIN, ED: Troponin i, poc: 0.04 ng/mL (ref 0.00–0.08)

## 2015-03-14 LAB — I-STAT CHEM 8, ED
BUN: 24 mg/dL — AB (ref 6–20)
CALCIUM ION: 1.18 mmol/L (ref 1.13–1.30)
CREATININE: 1.7 mg/dL — AB (ref 0.44–1.00)
Chloride: 94 mmol/L — ABNORMAL LOW (ref 101–111)
Glucose, Bld: 128 mg/dL — ABNORMAL HIGH (ref 70–99)
HCT: 35 % — ABNORMAL LOW (ref 36.0–46.0)
HEMOGLOBIN: 11.9 g/dL — AB (ref 12.0–15.0)
Potassium: 4.3 mmol/L (ref 3.5–5.1)
Sodium: 139 mmol/L (ref 135–145)
TCO2: 31 mmol/L (ref 0–100)

## 2015-03-14 LAB — LACTIC ACID, PLASMA
LACTIC ACID, VENOUS: 1.5 mmol/L (ref 0.5–2.0)
Lactic Acid, Venous: 1.7 mmol/L (ref 0.5–2.0)

## 2015-03-14 LAB — PROCALCITONIN: Procalcitonin: 6.7 ng/mL

## 2015-03-14 LAB — STREP PNEUMONIAE URINARY ANTIGEN: Strep Pneumo Urinary Antigen: POSITIVE — AB

## 2015-03-14 LAB — APTT: aPTT: 31 seconds (ref 24–37)

## 2015-03-14 LAB — MRSA PCR SCREENING: MRSA BY PCR: NEGATIVE

## 2015-03-14 LAB — I-STAT CG4 LACTIC ACID, ED: Lactic Acid, Venous: 1.78 mmol/L (ref 0.5–2.0)

## 2015-03-14 MED ORDER — ONDANSETRON HCL 4 MG/2ML IJ SOLN: 4.0000 mg | Freq: Four times a day (QID) | INTRAMUSCULAR | Status: DC | PRN

## 2015-03-14 MED ORDER — DEXTROSE 5 % IV SOLN
1.0000 g | INTRAVENOUS | Status: DC
  Administered 2015-03-15: 1 g via INTRAVENOUS
  Filled 2015-03-14 (×2): qty 1

## 2015-03-14 MED ORDER — SODIUM CHLORIDE 0.9 % IJ SOLN: 10.0000 mL | INTRAMUSCULAR | Status: DC | PRN

## 2015-03-14 MED ORDER — ONDANSETRON HCL 4 MG PO TABS: 4.0000 mg | ORAL_TABLET | Freq: Four times a day (QID) | ORAL | Status: DC | PRN

## 2015-03-14 MED ORDER — SODIUM CHLORIDE 0.9 % IV BOLUS (SEPSIS)
500.0000 mL | Freq: Once | INTRAVENOUS | Status: AC
  Administered 2015-03-14: 500 mL via INTRAVENOUS

## 2015-03-14 MED ORDER — VANCOMYCIN HCL 10 G IV SOLR
1500.0000 mg | Freq: Once | INTRAVENOUS | Status: DC
  Filled 2015-03-14: qty 1500

## 2015-03-14 MED ORDER — ASPIRIN EC 81 MG PO TBEC
81.0000 mg | DELAYED_RELEASE_TABLET | Freq: Every day | ORAL | Status: DC
  Administered 2015-03-15 – 2015-03-22 (×8): 81 mg via ORAL
  Filled 2015-03-14 (×8): qty 1

## 2015-03-14 MED ORDER — NALOXONE HCL 1 MG/ML IJ SOLN
INTRAMUSCULAR | Status: AC
  Filled 2015-03-14: qty 2

## 2015-03-14 MED ORDER — RISPERIDONE 0.5 MG PO TABS
0.5000 mg | ORAL_TABLET | Freq: Every day | ORAL | Status: DC
  Administered 2015-03-14 – 2015-03-21 (×8): 0.5 mg via ORAL
  Filled 2015-03-14 (×8): qty 1

## 2015-03-14 MED ORDER — NALOXONE HCL 0.4 MG/ML IJ SOLN
0.4000 mg | Freq: Once | INTRAMUSCULAR | Status: AC
  Administered 2015-03-14: 0.4 mg via INTRAVENOUS

## 2015-03-14 MED ORDER — ALPRAZOLAM 1 MG PO TABS
1.0000 mg | ORAL_TABLET | Freq: Every day | ORAL | Status: DC
  Administered 2015-03-14 – 2015-03-21 (×8): 1 mg via ORAL
  Filled 2015-03-14 (×7): qty 2
  Filled 2015-03-14 (×2): qty 1

## 2015-03-14 MED ORDER — INSULIN ASPART 100 UNIT/ML ~~LOC~~ SOLN
0.0000 [IU] | SUBCUTANEOUS | Status: DC
  Administered 2015-03-14: 2 [IU] via SUBCUTANEOUS
  Administered 2015-03-14: 1 [IU] via SUBCUTANEOUS
  Administered 2015-03-15: 2 [IU] via SUBCUTANEOUS
  Administered 2015-03-15: 3 [IU] via SUBCUTANEOUS
  Administered 2015-03-15: 2 [IU] via SUBCUTANEOUS
  Administered 2015-03-16: 1 [IU] via SUBCUTANEOUS
  Administered 2015-03-16 (×2): 2 [IU] via SUBCUTANEOUS

## 2015-03-14 MED ORDER — CETYLPYRIDINIUM CHLORIDE 0.05 % MT LIQD
7.0000 mL | Freq: Two times a day (BID) | OROMUCOSAL | Status: DC
  Administered 2015-03-14 – 2015-03-22 (×17): 7 mL via OROMUCOSAL

## 2015-03-14 MED ORDER — ALBUTEROL SULFATE (2.5 MG/3ML) 0.083% IN NEBU
2.5000 mg | INHALATION_SOLUTION | Freq: Three times a day (TID) | RESPIRATORY_TRACT | Status: DC
  Administered 2015-03-14 – 2015-03-22 (×24): 2.5 mg via RESPIRATORY_TRACT
  Filled 2015-03-14 (×24): qty 3

## 2015-03-14 MED ORDER — TIOTROPIUM BROMIDE MONOHYDRATE 18 MCG IN CAPS
18.0000 ug | ORAL_CAPSULE | Freq: Every morning | RESPIRATORY_TRACT | Status: DC
  Administered 2015-03-14 – 2015-03-22 (×8): 18 ug via RESPIRATORY_TRACT
  Filled 2015-03-14 (×2): qty 5

## 2015-03-14 MED ORDER — NALOXONE HCL 0.4 MG/ML IJ SOLN
INTRAMUSCULAR | Status: AC
  Filled 2015-03-14: qty 1

## 2015-03-14 MED ORDER — HEPARIN SODIUM (PORCINE) 5000 UNIT/ML IJ SOLN
5000.0000 [IU] | Freq: Three times a day (TID) | INTRAMUSCULAR | Status: DC
  Administered 2015-03-14 – 2015-03-22 (×24): 5000 [IU] via SUBCUTANEOUS
  Filled 2015-03-14 (×21): qty 1

## 2015-03-14 MED ORDER — VANCOMYCIN HCL IN DEXTROSE 1-5 GM/200ML-% IV SOLN: 1000.0000 mg | Freq: Once | INTRAVENOUS | Status: DC

## 2015-03-14 MED ORDER — FLUOXETINE HCL 20 MG PO CAPS
20.0000 mg | ORAL_CAPSULE | Freq: Every morning | ORAL | Status: DC
  Administered 2015-03-15 – 2015-03-22 (×8): 20 mg via ORAL
  Filled 2015-03-14 (×12): qty 1

## 2015-03-14 MED ORDER — ACETAMINOPHEN 650 MG RE SUPP: 650.0000 mg | Freq: Four times a day (QID) | RECTAL | Status: DC | PRN

## 2015-03-14 MED ORDER — PANTOPRAZOLE SODIUM 40 MG PO TBEC
40.0000 mg | DELAYED_RELEASE_TABLET | Freq: Two times a day (BID) | ORAL | Status: DC
  Administered 2015-03-14 – 2015-03-22 (×16): 40 mg via ORAL
  Filled 2015-03-14 (×16): qty 1

## 2015-03-14 MED ORDER — ACETAMINOPHEN 325 MG PO TABS
650.0000 mg | ORAL_TABLET | Freq: Four times a day (QID) | ORAL | Status: DC | PRN
  Administered 2015-03-15: 650 mg via ORAL
  Filled 2015-03-14: qty 2

## 2015-03-14 MED ORDER — DEXTROSE 5 % IV SOLN
1.0000 g | Freq: Once | INTRAVENOUS | Status: AC
  Administered 2015-03-14: 1 g via INTRAVENOUS
  Filled 2015-03-14: qty 1

## 2015-03-14 MED ORDER — IPRATROPIUM-ALBUTEROL 0.5-2.5 (3) MG/3ML IN SOLN
3.0000 mL | Freq: Once | RESPIRATORY_TRACT | Status: AC
  Administered 2015-03-14: 3 mL via RESPIRATORY_TRACT

## 2015-03-14 MED ORDER — VANCOMYCIN HCL 500 MG IV SOLR
500.0000 mg | Freq: Once | INTRAVENOUS | Status: AC
  Administered 2015-03-14: 500 mg via INTRAVENOUS
  Filled 2015-03-14: qty 500

## 2015-03-14 MED ORDER — CLINDAMYCIN PHOSPHATE 600 MG/50ML IV SOLN
600.0000 mg | Freq: Once | INTRAVENOUS | Status: AC
  Administered 2015-03-14: 600 mg via INTRAVENOUS
  Filled 2015-03-14: qty 50

## 2015-03-14 MED ORDER — VANCOMYCIN HCL IN DEXTROSE 1-5 GM/200ML-% IV SOLN
1000.0000 mg | INTRAVENOUS | Status: DC
  Administered 2015-03-15: 1000 mg via INTRAVENOUS
  Filled 2015-03-14 (×2): qty 200

## 2015-03-14 MED ORDER — CHLORHEXIDINE GLUCONATE 0.12 % MT SOLN
15.0000 mL | Freq: Two times a day (BID) | OROMUCOSAL | Status: DC
  Administered 2015-03-14 – 2015-03-22 (×15): 15 mL via OROMUCOSAL
  Filled 2015-03-14 (×15): qty 15

## 2015-03-14 MED ORDER — VANCOMYCIN HCL IN DEXTROSE 1-5 GM/200ML-% IV SOLN
1000.0000 mg | Freq: Once | INTRAVENOUS | Status: DC
Start: 2015-03-14 — End: 2015-03-14
  Filled 2015-03-14: qty 200

## 2015-03-14 MED ORDER — IPRATROPIUM BROMIDE 0.02 % IN SOLN
RESPIRATORY_TRACT | Status: AC
  Filled 2015-03-14: qty 2.5

## 2015-03-14 MED ORDER — SODIUM CHLORIDE 0.9 % IJ SOLN
3.0000 mL | Freq: Two times a day (BID) | INTRAMUSCULAR | Status: DC
  Administered 2015-03-15 – 2015-03-18 (×4): 3 mL via INTRAVENOUS

## 2015-03-14 MED ORDER — SODIUM CHLORIDE 0.9 % IV SOLN: INTRAVENOUS | Status: DC | PRN

## 2015-03-14 MED ORDER — SODIUM CHLORIDE 0.9 % IV BOLUS (SEPSIS)
1000.0000 mL | INTRAVENOUS | Status: AC
  Administered 2015-03-14 (×2): 1000 mL via INTRAVENOUS

## 2015-03-14 MED ORDER — DILTIAZEM HCL ER COATED BEADS 240 MG PO CP24
240.0000 mg | ORAL_CAPSULE | Freq: Every morning | ORAL | Status: DC
  Administered 2015-03-15 – 2015-03-22 (×8): 240 mg via ORAL
  Filled 2015-03-14 (×12): qty 1

## 2015-03-14 MED ORDER — SODIUM CHLORIDE 0.9 % IJ SOLN
10.0000 mL | Freq: Two times a day (BID) | INTRAMUSCULAR | Status: DC
  Administered 2015-03-14: 10 mL
  Administered 2015-03-15: 20 mL
  Administered 2015-03-15 – 2015-03-21 (×11): 10 mL

## 2015-03-14 MED ORDER — SODIUM CHLORIDE 0.9 % IV BOLUS (SEPSIS)
500.0000 mL | INTRAVENOUS | Status: AC
  Administered 2015-03-14: 500 mL via INTRAVENOUS

## 2015-03-14 MED ORDER — SODIUM CHLORIDE 0.9 % IV SOLN: INTRAVENOUS | Status: AC

## 2015-03-14 MED ORDER — ALBUTEROL SULFATE (2.5 MG/3ML) 0.083% IN NEBU
INHALATION_SOLUTION | RESPIRATORY_TRACT | Status: AC
  Filled 2015-03-14: qty 6

## 2015-03-14 NOTE — ED Notes (Addendum)
EMS called out unresponsive.  Pt resident at adult care home.  CBG 126.  Pt hypotensive 90/50.  Initial sat 42% on room air.  Pt placed on non rebreather and sats up to 100%.  Pt responding some  now.  Pt has DNR paper work ,  Has bladder cancer.  Seen here yesterday with back pain and sob.  Was given prescriptions for pain.

## 2015-03-14 NOTE — ED Notes (Signed)
MD notified that pt b/p trending down, order obtained.

## 2015-03-14 NOTE — ED Notes (Signed)
Pt more awake and alert, pt oriented to person and place.

## 2015-03-14 NOTE — ED Notes (Signed)
Ems called out for unresponsive pt.  Pt resident at adult care home.  CBG 126.  B/p 90/50.  Initial RA sats 42 % on room air.  Pt placed on non rebreather and and sats up to 100%.  Pt responding some now.  Pt has DNR paper work.  Has bladder cancer.  Seen here yesterday with back pain and sob.  Was given prescriptions for pain.

## 2015-03-14 NOTE — Progress Notes (Signed)
Peripherally Inserted Central Catheter/Midline Placement  The IV Nurse has discussed with the patient and/or persons authorized to consent for the patient, the purpose of this procedure and the potential benefits and risks involved with this procedure.  The benefits include less needle sticks, lab draws from the catheter and patient may be discharged home with the catheter.  Risks include, but not limited to, infection, bleeding, blood clot (thrombus formation), and puncture of an artery; nerve damage and irregular heat beat.  Alternatives to this procedure were also discussed.  PICC/Midline Placement Documentation  PICC Triple Lumen 35/57/32 PICC Right Basilic 36 cm 0 cm (Active)  Indication for Insertion or Continuance of Line Chronic illness with exacerbations (CF, Sickle Cell, etc.) 03/14/2015  2:00 PM  Exposed Catheter (cm) 0 cm 03/14/2015  2:00 PM  Site Assessment Clean;Dry;Intact;Other (Comment) 03/14/2015  2:00 PM  Lumen #1 Status Flushed;Capped (Central line);Blood return noted 03/14/2015  2:00 PM  Lumen #2 Status Flushed;Capped (Central line);Blood return noted 03/14/2015  2:00 PM  Lumen #3 Status Flushed;Capped (Central line);Blood return noted 03/14/2015  2:00 PM  Dressing Type Transparent;Securing device 03/14/2015  2:00 PM  Dressing Status Clean;Dry;Intact;Antimicrobial disc in place 03/14/2015  2:00 PM  Line Care Connections checked and tightened 03/14/2015  2:00 PM  Line Adjustment (NICU/IV Team Only) No 03/14/2015  2:00 PM  Dressing Intervention New dressing 03/14/2015  2:00 PM  Dressing Change Due 03/21/15 03/14/2015  2:00 PM       April Pearson, Barnett Applebaum S 03/14/2015, 2:12 PM

## 2015-03-14 NOTE — ED Notes (Signed)
Care giver at bedside, states she notified pt family.

## 2015-03-14 NOTE — H&P (Signed)
[facility] History and Physical  April Pearson AYT:[id number] DOB: Mar 28, 1941 DOA: 03/14/2015  Referring physician: Dr. Eulis Foster PCP: Alonza Bogus, MD   Chief Complaint: confusion  HPI: April Pearson is a 74 y.o. female recently discharged from the hospital on 11/13/2015 for healthcare associated pneumonia, she was at the skilled nursing facility. She was recently in the hospital for back pain day prior to admission she was given narcotics and was sent back to her facility. Comes in as she was found down in the floor confused. She was unresponsive at the field she was started on a nonrebreather and given Narcan without any response. As per family members she's had no fever chills, or cough. The patient relates she is struggling to breathe on BiPAP. Family members relate that she was not confused the day prior to admission.  In the ED: She was started on BiPAP as she regained consciousness, she was given IV vancomycin, CBC was done that was 30,000 and a chest x-ray with results as below.   Review of Systems:  Constitutional:  No weight loss, night sweats, Fevers, chills, fatigue.  HEENT:  No headaches, Difficulty swallowing,Tooth/dental problems,Sore throat,  No sneezing, itching, ear ache, nasal congestion, post nasal drip,  Cardio-vascular:  No chest pain, Orthopnea, PND, swelling in lower extremities, anasarca, dizziness, palpitations  GI:  No heartburn, indigestion, abdominal pain, nausea, vomiting, diarrhea, change in bowel habits, loss of appetite  Resp:   No excess mucus, no productive cough, No non-productive cough, No coughing up of blood.No change in color of mucus.No wheezing.No chest wall deformity  Skin:  no rash or lesions.  GU:  no dysuria, change in color of urine, no urgency or frequency. No flank pain.  Musculoskeletal:  No joint pain or swelling. No decreased range of motion. No back pain.  Psych:  No change in mood or affect. No depression or  anxiety. No memory loss.   Past Medical History  Diagnosis Date  . COPD (chronic obstructive pulmonary disease)   . Osteoporosis   . Anxiety   . Weakness   . Tachycardia   . Cancer     bladder  . Depression   . Arthritis     KNEES AND HANDS  . Coarse tremors     TREMORS BOTH HANDS - PT STATES SIDE EFFECT OF HER MEDICATIONS FOR HER BREATHING  . Dizziness     sometimes  . Shortness of breath     USES OXYGEN 2 L / MIN NASAL CANNUA   24 HRS A DAY; LIV[facility] IN [name]--CAN AMBULATE SHORT DISTANCE BUT ACTIVITIES VERY LIMITED BY SOB  . Anginal pain     "with fluttering, seen MD about it"  . History of shingles   . Emphysema   . GERD (gastroesophageal reflux disease)     sometimes  . Laryngitis     10/2013, hx of  . Pneumonia     hx of  . Bipolar 1 disorder   . Cough 06/29/14    pt states recent cough / cold - feeling better - cough now non-productive.  . Frequent urination    Past Surgical History  Procedure Laterality Date  . Bladder surgery    . Cystoscopy w/ retrogrades Bilateral 06/19/2013    Procedure: CYSTOSCOPY WITH BIOSPY AND FULGERATION, BILATERAL RETROGRADE PYELOGRAM;  Surgeon: Malka So, MD;  Location: [facility];  Service: Urology;  Laterality: Bilateral;  . Breast surgery Left     FOR TUMOR -  BENIGN  . Cataract extraction Bilateral 3 years ago  . Cystoscopy w/ retrogrades Bilateral 12/25/2013    Procedure: CYSTOSCOPY WITH BILATERAL RETROGRADE WITH BIOPSY WITH FULGERATION;  Surgeon: Irine Seal, MD;  Location: [facility];  Service: Urology;  Laterality: Bilateral;  . Transurethral resection of bladder tumor Bilateral 12/25/2013    Procedure: TRANSURETHRAL RESECTION OF BLADDER TUMOR (TURBT);  Surgeon: Irine Seal, MD;  Location: [facility];  Service: Urology;  Laterality: Bilateral;  . Cystoscopy with retrograde pyelogram, ureteroscopy and stent placement Bilateral 07/02/2014    Procedure: CYSTOSCOPY WITH BILATERAL RETROGRADE PYELOGRAM;   Surgeon: Malka So, MD;  Location: [facility];  Service: Urology;  Laterality: Bilateral;  . Transurethral resection of bladder tumor N/A 07/02/2014    Procedure: TRANSURETHRAL RESECTION OF BLADDER TUMOR (TURBT);  Surgeon: Malka So, MD;  Location: [facility];  Service: Urology;  Laterality: N/A;   Social History:  reports that she quit smoking about 14 years ago. Her smoking use included Cigarettes. She has a 80 pack-year smoking history. She has never used smokeless tobacco. She reports that she does not drink alcohol or use illicit drugs.  No Known Allergies  History reviewed. No pertinent family history.  Her dad died of a heart attack Unknown mother history.  Prior to Admission medications   Medication Sig Start Date End Date Taking? Authorizing Provider  albuterol (PROVENTIL) (2.5 MG/3ML) 0.083% nebulizer solution Take 2.5 mg by nebulization 3 (three) times daily.   Yes Historical Provider, MD  alendronate (FOSAMAX) 70 MG tablet Take 70 mg by mouth every Friday. Take with a full glass of water on an empty stomach.   Yes Historical Provider, MD  ALPRAZolam Duanne Moron) 1 MG tablet Take 1 mg by mouth at bedtime.   Yes Historical Provider, MD  aspirin EC 81 MG tablet Take 81 mg by mouth daily.   Yes Historical Provider, MD  diltiazem (TIAZAC) 240 MG 24 hr capsule Take 240 mg by mouth every morning.    Yes Historical Provider, MD  ferrous sulfate 325 (65 FE) MG tablet Take 325 mg by mouth daily.   Yes Historical Provider, MD  FLUoxetine (PROZAC) 20 MG tablet Take 20 mg by mouth every morning.    Yes Historical Provider, MD  fluticasone (FLONASE) 50 MCG/ACT nasal spray Place 2 sprays into the nose daily.   Yes Historical Provider, MD  furosemide (LASIX) 40 MG tablet Take 1 tablet (40 mg total) by mouth every morning. As needed for edema 11/12/14  Yes Sinda Du, MD  HYDROcodone-acetaminophen (NORCO/VICODIN) 5-325 MG per tablet Take 1 tablet by mouth every 6 (six) hours as needed. 03/13/15  Yes  Lily Kocher, PA-C  pantoprazole (PROTONIX) 40 MG tablet Take 1 tablet by mouth 2 (two) times daily. 03/04/15  Yes Historical Provider, MD  risperiDONE (RISPERDAL) 0.5 MG tablet Take 0.5 mg by mouth at bedtime.   Yes Historical Provider, MD  tiotropium (SPIRIVA) 18 MCG inhalation capsule Place 18 mcg into inhaler and inhale every morning.   Yes Historical Provider, MD  predniSONE (DELTASONE) 50 MG tablet One tablet PO daily for 4 days Patient not taking: Reported on 03/13/2015 02/11/15   Ripley Fraise, MD   Physical Exam: Filed Vitals:   03/14/15 0930 03/14/15 0945 03/14/15 1000 03/14/15 1015  BP: 95/52 103/48 100/52 125/107  Pulse: 109 108 104 103  Resp: 16 19 16 14   SpO2: 92% 93% 93% 91%    Wt Readings from Last 3 Encounters:  03/13/15 83.462 kg (184 lb)  11/14/14 83.462  kg (184 lb)  06/29/14 76.204 kg (168 lb)    General:  Appears calm and comfortable Eyes: PERRL, normal lids, irises & conjunctiva ENT: grossly normal hearing, lips & tongue Neck: no LAD, masses or thyromegaly Cardiovascular: RRR, no m/r/g. No LE edema. Telemetry: SR, no arrhythmias  Respiratory: She has moderate air movement with right-sided crackles and wheezing bilaterally. Abdomen: soft, ntnd Skin: no rash or induration seen on limited exam Musculoskeletal: grossly normal tone BUE/BLE Psychiatric: grossly normal mood and affect, speech fluent and appropriate Neurologic: grossly non-focal. she does have a positive gag reflex.           Labs on Admission:  Basic Metabolic Panel:  Recent Labs Lab 03/13/15 0915 03/14/15 0816 03/14/15 0826  NA 145 140 139  K 3.3* 4.3 4.3  CL 94* 95* 94*  CO2 42* 36*  --   GLUCOSE 121* 129* 128*  BUN 16 22* 24*  CREATININE 1.17* 1.56* 1.70*  CALCIUM 9.3 9.3  --    Liver Function Tests: No results for input(s): AST, ALT, ALKPHOS, BILITOT, PROT, ALBUMIN in the last 168 hours. No results for input(s): LIPASE, AMYLASE in the last 168 hours. No results for input(s):  AMMONIA in the last 168 hours. CBC:  Recent Labs Lab 03/13/15 0915 03/14/15 0816 03/14/15 0826  WBC 9.4 30.8*  --   NEUTROABS 7.8* 27.1*  --   HGB 10.9* 10.5* 11.9*  HCT 35.9* 34.9* 35.0*  MCV 95.7 96.9  --   PLT 336 436*  --    Cardiac Enzymes: No results for input(s): CKTOTAL, CKMB, CKMBINDEX, TROPONINI in the last 168 hours.  BNP (last 3 results)  Recent Labs  11/06/14 1430  BNP 269.0*    ProBNP (last 3 results) No results for input(s): PROBNP in the last 8760 hours.  CBG: No results for input(s): GLUCAP in the last 168 hours.  Radiological Exams on Admission: Dg Chest 2 View  03/13/2015   CLINICAL DATA:  Chest and back pain  EXAM: CHEST  2 VIEW  COMPARISON:  February 11, 2015  FINDINGS: There is evidence of a degree of underlying emphysematous change. There is no edema or consolidation. Heart is upper normal in size with pulmonary vascularity within normal limits. There is atherosclerotic change in the aortic arch region. No adenopathy. Stable prior left sixth rib fracture. There is slight anterior wedging of a lower thoracic vertebral body.  IMPRESSION: No edema or consolidation.  Underlying emphysematous change.   Electronically Signed   By: Lowella Grip III M.D.   On: 03/13/2015 10:02   Dg Lumbar Spine Complete  03/13/2015   CLINICAL DATA:  Three-day history of lumbago  EXAM: LUMBAR SPINE - COMPLETE 4+ VIEW  COMPARISON:  CT abdomen and pelvis with bony reformats August 20, 2012  FINDINGS: Frontal, lateral, spot lumbosacral lateral, and bilateral oblique views were obtained. There are 5 non-rib-bearing lumbar type vertebral bodies. There is stable anterior wedging of the L1 vertebral body. There is mild concavity of the posterior superior endplate of the L4 vertebral body, stable. There is no new fracture. There is no spondylolisthesis. There is moderate disc space narrowing at T12-L1, L1-2, and L2-3. There is milder narrowing at L3-4 L4-5. There is facet osteoarthritic  change at L3-4, L4-5, and L5-S1 bilaterally. There is atherosclerotic change in the aorta and iliac arteries.  IMPRESSION: Stable anterior wedge compression fracture of L1. Stable posterior superior endplate concavity at L4. No new fracture. Moderate osteoarthritic change. No spondylolisthesis.   Electronically Signed  By: Lowella Grip III M.D.   On: 03/13/2015 10:15   Dg Chest Portable 1 View  03/14/2015   CLINICAL DATA:  Altered mental status and decreased oxygen saturation  EXAM: PORTABLE CHEST - 1 VIEW  COMPARISON:  March 13, 2015  FINDINGS: There is patchy interstitial and alveolar opacity throughout much of the right mid and lower lung zone. The left lung is clear. Heart is upper normal in size with pulmonary vascularity within normal limits. No adenopathy. There is atherosclerotic calcification in the aortic arch.  IMPRESSION: Patchy interstitial and alveolar opacity in the right mid and lower lung zones. The appearance is consistent with pneumonia. Aspiration could present in this manner as well. Left lung clear. No change in cardiac silhouette.   Electronically Signed   By: Lowella Grip III M.D.   On: 03/14/2015 08:38    EKG: Independently reviewed. Sinus tachycardia with right axis deviation.  Assessment/Plan  Acute respiratory failure with hypercapnia HCAP (healthcare-associated pneumonia)/  Sepsis: - I agree with putting her on BiPAP, have discussed with her sister has been helping with her care last 5 years. Have explained to her that her prognosis is poor due to the severe sepsis and degree of hypercapnia. She remains a DNR/DNI like [place] to treat her aggressively and reevaluate in a daily basis. - Chest x-ray showed multilobar pneumonia, probably due to aspiration. - I will start her on IV vancomycin and Zosyn as she was just discharged 2 days ago, and her lactic acid. - Start her on aggressive IV fluid hydration as her lactate is rising. - Nothing by mouth, Tylenol for fever. -  Place in a line check an ABG in an hour after being on BiPAP.  Acute encephalopathy: Likely due to infectious etiology. She is now more awake she is no she's is the hospital. She does have a gag reflex.  Acute renal insufficiency - This is likely due to sepsis, started on aggressive fluid hydration, recheck a basic metabolic panel in the morning.  Leukocytosis: - -In the septic range probably do to healthcare associated pneumonia.   Code Status: DNR DVT Prophylaxis:heparin Family Communication: sisters Disposition Plan: inpatient unknown durations.  Time spent: 80 min  Charlynne Cousins [facility] Pager [phone]

## 2015-03-14 NOTE — ED Notes (Signed)
Pt placed on bi-pap pt opens eyes to verbal stimuli, does not follow directions and does not attempt to communicate.

## 2015-03-14 NOTE — Progress Notes (Signed)
Pt transported to ICU from ER.  Pt placed back on BIPAP on previous settings.  Pt tolerating well at this time.  RT will continue to monitor.

## 2015-03-14 NOTE — ED Provider Notes (Signed)
CSN: [id number]     Arrival date & time 03/14/15  0758 History   First MD Initiated Contact with Patient 03/14/15 0809     Chief Complaint  Patient presents with  . Altered Mental Status     (Consider location/radiation/quality/duration/timing/severity/associated sxs/prior Treatment) HPI   April Pearson is a 73 y.o. female who presents for evaluation of altered mental status. She was barely found altered with hypoxia, at her assisted living facility. She was evaluated yesterday in the emergency department for back pain and was treated with narcotic analgesia. EMS. On arrival, she was done did, we repositioned her airway, and her oxygen saturation improved. She was transported here on face mask oxygen at 100%. On arrival to the emergency department. She did make tachycardic and has normal urination.  Level V Caveat- unresponsive   Past Medical History  Diagnosis Date  . COPD (chronic obstructive pulmonary disease)   . Osteoporosis   . Anxiety   . Weakness   . Tachycardia   . Cancer     bladder  . Depression   . Arthritis     KNEES AND HANDS  . Coarse tremors     TREMORS BOTH HANDS - PT STATES SIDE EFFECT OF HER MEDICATIONS FOR HER BREATHING  . Dizziness     sometimes  . Shortness of breath     USES OXYGEN 2 L / MIN NASAL CANNUA   24 HRS A DAY; LIVES IN [name]--CAN AMBULATE SHORT DISTANCE BUT ACTIVITIES VERY LIMITED BY SOB  . Anginal pain     "with fluttering, seen MD about it"  . History of shingles   . Emphysema   . GERD (gastroesophageal reflux disease)     sometimes  . Laryngitis     10/2013, hx of  . Pneumonia     hx of  . Bipolar 1 disorder   . Cough 06/29/14    pt states recent cough / cold - feeling better - cough now non-productive.  . Frequent urination    Past Surgical History  Procedure Laterality Date  . Bladder surgery    . Cystoscopy w/ retrogrades Bilateral 06/19/2013    Procedure: CYSTOSCOPY WITH BIOSPY AND  FULGERATION, BILATERAL RETROGRADE PYELOGRAM;  Surgeon: Malka So, MD;  Location: [facility];  Service: Urology;  Laterality: Bilateral;  . Breast surgery Left     FOR TUMOR - BENIGN  . Cataract extraction Bilateral 3 years ago  . Cystoscopy w/ retrogrades Bilateral 12/25/2013    Procedure: CYSTOSCOPY WITH BILATERAL RETROGRADE WITH BIOPSY WITH FULGERATION;  Surgeon: Irine Seal, MD;  Location: [facility];  Service: Urology;  Laterality: Bilateral;  . Transurethral resection of bladder tumor Bilateral 12/25/2013    Procedure: TRANSURETHRAL RESECTION OF BLADDER TUMOR (TURBT);  Surgeon: Irine Seal, MD;  Location: [facility];  Service: Urology;  Laterality: Bilateral;  . Cystoscopy with retrograde pyelogram, ureteroscopy and stent placement Bilateral 07/02/2014    Procedure: CYSTOSCOPY WITH BILATERAL RETROGRADE PYELOGRAM;  Surgeon: Malka So, MD;  Location: [facility];  Service: Urology;  Laterality: Bilateral;  . Transurethral resection of bladder tumor N/A 07/02/2014    Procedure: TRANSURETHRAL RESECTION OF BLADDER TUMOR (TURBT);  Surgeon: Malka So, MD;  Location: [facility];  Service: Urology;  Laterality: N/A;   History reviewed. No pertinent family history. History  Substance Use Topics  . Smoking status: Former Smoker -- 2.00 packs/day for 40 years    Types: Cigarettes    Quit date: 11/13/2000  .  Smokeless tobacco: Never Used     Comment: QUIT SMOKING YRS AGO  . Alcohol Use: No   OB History    No data available     Review of Systems  Unable to perform ROS     Allergies  Review of patient's allergies indicates no known allergies.  Home Medications   Prior to Admission medications   Medication Sig Start Date End Date Taking? Authorizing Provider  albuterol (PROVENTIL) (2.5 MG/3ML) 0.083% nebulizer solution Take 2.5 mg by nebulization 3 (three) times daily.   Yes Historical Provider, MD  alendronate (FOSAMAX) 70 MG tablet Take 70 mg by mouth every Friday. Take with a full glass of water on an  empty stomach.   Yes Historical Provider, MD  ALPRAZolam Duanne Moron) 1 MG tablet Take 1 mg by mouth at bedtime.   Yes Historical Provider, MD  aspirin EC 81 MG tablet Take 81 mg by mouth daily.   Yes Historical Provider, MD  diltiazem (TIAZAC) 240 MG 24 hr capsule Take 240 mg by mouth every morning.    Yes Historical Provider, MD  ferrous sulfate 325 (65 FE) MG tablet Take 325 mg by mouth daily.   Yes Historical Provider, MD  FLUoxetine (PROZAC) 20 MG tablet Take 20 mg by mouth every morning.    Yes Historical Provider, MD  fluticasone (FLONASE) 50 MCG/ACT nasal spray Place 2 sprays into the nose daily.   Yes Historical Provider, MD  furosemide (LASIX) 40 MG tablet Take 1 tablet (40 mg total) by mouth every morning. As needed for edema 11/12/14  Yes Sinda Du, MD  HYDROcodone-acetaminophen (NORCO/VICODIN) 5-325 MG per tablet Take 1 tablet by mouth every 6 (six) hours as needed. 03/13/15  Yes Lily Kocher, PA-C  pantoprazole (PROTONIX) 40 MG tablet Take 1 tablet by mouth 2 (two) times daily. 03/04/15  Yes Historical Provider, MD  risperiDONE (RISPERDAL) 0.5 MG tablet Take 0.5 mg by mouth at bedtime.   Yes Historical Provider, MD  tiotropium (SPIRIVA) 18 MCG inhalation capsule Place 18 mcg into inhaler and inhale every morning.   Yes Historical Provider, MD  predniSONE (DELTASONE) 50 MG tablet One tablet PO daily for 4 days Patient not taking: Reported on 03/13/2015 02/11/15   Ripley Fraise, MD   BP 120/62 mmHg  Pulse 102  Temp(Src) 99.6 F (37.6 C) (Axillary)  Resp 16  Ht 5\' 9"  (1.753 m)  Wt 167 lb 12.3 oz (76.1 kg)  BMI 24.76 kg/m2  SpO2 100%  LMP  (LMP Unknown) Physical Exam  Constitutional: She appears well-developed.  Elderly, frail  HENT:  Head: Normocephalic and atraumatic.  Right Ear: External ear normal.  Left Ear: External ear normal.  Large occipital bony protuberance likely chronic/congenital  Eyes: Conjunctivae and EOM are normal. Pupils are equal, round, and reactive to  light.  Pupils to 3 mm, reactive bilaterally.  Neck: Normal range of motion and phonation normal. Neck supple.  No JVD  Cardiovascular: Normal heart sounds.   Tachypnea  Pulmonary/Chest: Breath sounds normal. She is in respiratory distress. She exhibits no bony tenderness.  Abdominal: Soft. There is no tenderness.  Musculoskeletal: Normal range of motion.  Neurological: She is alert. No sensory deficit. She exhibits normal muscle tone.  Obtunded. No focal asymmetry of muscle tone.  Skin: Skin is warm, dry and intact.  Psychiatric:  Obtunded  Nursing note and vitals reviewed.   ED Course  Procedures (including critical care time)  08:20- initial clinical exam consistent with nonspecific ultimate status. Differential diagnosis includes CVA,  aspiration pneumonia, PE, serious bacterial infection, narcotic overdose. Patient is DO NOT RESUSCITATE. Do not anticipate initiating any aggressive treatment at this time.  08:25-placed on his gamma oxygen with stable oxygen saturations 95-96%.   08:35- arterial blood gas consistent with acute respiratory failure and hypercapnia. This likely represents the source of her altered mental status. Since patient is not DO NOT RESUSCITATE, she will be placed on BiPAP. Recently some risk with this since her mental status is altered and she may not be able to control her secretions. However, there is no indication for endotracheal intubation at this time, since she is a DO NOT RESUSCITATE patient. There are no family members with her. at this time to discuss the situation with. I will attempt to contact them.  08:55- patient's sister arrived to the ED, she is aware of the patient's clinical and past history status. She states that her sister is not usually this ill. Patient's sister understands that she has a serious illness, and is not asking for intubation, at this time. She is comfortable with current care and treatment plan.  09:00- clindamycin added for  possible aspiration pneumonia     Medications  0.9 %  sodium chloride infusion ( Intravenous Rate/Dose Change 03/14/15 1530)  pantoprazole (PROTONIX) EC tablet 40 mg (40 mg Oral Not Given 03/14/15 1130)  albuterol (PROVENTIL) (2.5 MG/3ML) 0.083% nebulizer solution 2.5 mg (2.5 mg Nebulization Given 03/14/15 1504)  tiotropium (SPIRIVA) inhalation capsule 18 mcg (18 mcg Inhalation Given 03/14/15 1504)  ALPRAZolam (XANAX) tablet 1 mg (not administered)  aspirin EC tablet 81 mg (81 mg Oral Not Given 03/14/15 1130)  diltiazem (TIAZAC) 24 hr capsule 240 mg (240 mg Oral Not Given 03/14/15 1130)  FLUoxetine (PROZAC) tablet 20 mg (20 mg Oral Not Given 03/14/15 1130)  risperiDONE (RISPERDAL) tablet 0.5 mg (not administered)  ceFEPIme (MAXIPIME) 1 g in dextrose 5 % 50 mL IVPB (not administered)  heparin injection 5,000 Units (5,000 Units Subcutaneous Given 03/14/15 1457)  sodium chloride 0.9 % injection 3 mL (0 mLs Intravenous Duplicate 03/18/37 7564)  acetaminophen (TYLENOL) tablet 650 mg (not administered)    Or  acetaminophen (TYLENOL) suppository 650 mg (not administered)  ondansetron (ZOFRAN) tablet 4 mg (not administered)    Or  ondansetron (ZOFRAN) injection 4 mg (not administered)  insulin aspart (novoLOG) injection 0-9 Units (2 Units Subcutaneous Given 03/14/15 1204)  vancomycin (VANCOCIN) IVPB 1000 mg/200 mL premix (not administered)  vancomycin (VANCOCIN) IVPB 1000 mg/200 mL premix (not administered)  chlorhexidine (PERIDEX) 0.12 % solution 15 mL (0 mLs Mouth Rinse Duplicate 01/14/28 5188)  antiseptic oral rinse (CPC / CETYLPYRIDINIUM CHLORIDE 0.05%) solution 7 mL (7 mLs Mouth Rinse Given 03/14/15 1345)  sodium chloride 0.9 % injection 10-40 mL (0 mLs Intracatheter Duplicate 02/11/65 0630)  sodium chloride 0.9 % injection 10-40 mL (not administered)  naloxone (NARCAN) injection 0.4 mg ( Intravenous Duplicate 11/19/99 0932)  ipratropium-albuterol (DUONEB) 0.5-2.5 (3) MG/3ML nebulizer solution 3 mL (3 mLs  Nebulization Given 03/14/15 0817)  naloxone (NARCAN) 1 MG/ML injection (  Duplicate 01/16/56 3220)  ipratropium (ATROVENT) 0.02 % nebulizer solution (  Duplicate 12/18/40 7062)  albuterol (PROVENTIL) (2.5 MG/3ML) 0.083% nebulizer solution (  Duplicate 01/17/61 8315)  clindamycin (CLEOCIN) IVPB 600 mg (0 mg Intravenous Stopped 03/14/15 0941)  sodium chloride 0.9 % bolus 500 mL (0 mLs Intravenous Stopped 03/14/15 1019)  ceFEPIme (MAXIPIME) 1 g in dextrose 5 % 50 mL IVPB (1 g Intravenous New Bag/Given 03/14/15 1041)  sodium chloride 0.9 % bolus 1,000  mL (1,000 mLs Intravenous Given 03/14/15 1244)    Followed by  sodium chloride 0.9 % bolus 500 mL (500 mLs Intravenous Given 03/14/15 1455)  vancomycin (VANCOCIN) 500 mg in sodium chloride 0.9 % 100 mL IVPB (500 mg Intravenous Given 03/14/15 1244)    Patient Vitals for the past 24 hrs:  BP Temp Temp src Pulse Resp SpO2 Height Weight  03/14/15 1504 - - - (!) 102 16 100 % - -  03/14/15 1500 120/62 mmHg - - 96 (!) 22 (!) 88 % - -  03/14/15 1445 117/64 mmHg - - 91 17 92 % - -  03/14/15 1430 120/63 mmHg - - (!) 106 19 92 % - -  03/14/15 1425 - - - (!) 103 20 92 % - -  03/14/15 1415 (!) 119/26 mmHg - - (!) 105 17 100 % - -  03/14/15 1400 117/82 mmHg - - (!) 108 19 100 % - -  03/14/15 1345 (!) 113/57 mmHg - - (!) 105 18 99 % - -  03/14/15 1330 (!) 102/54 mmHg - - (!) 103 14 99 % - -  03/14/15 1315 112/70 mmHg - - (!) 104 15 100 % - -  03/14/15 1300 (!) 108/42 mmHg - - (!) 109 19 100 % - -  03/14/15 1230 (!) 97/53 mmHg - - (!) 110 (!) 23 100 % - -  03/14/15 1215 (!) 119/58 mmHg - - 95 (!) 23 100 % - -  03/14/15 1200 123/61 mmHg - - (!) 111 (!) 23 100 % - -  03/14/15 1145 114/63 mmHg - - (!) 115 19 100 % - -  03/14/15 1130 (!) 99/52 mmHg - - (!) 116 18 100 % - -  03/14/15 1115 (!) 104/56 mmHg - - (!) 118 (!) 21 100 % - -  03/14/15 1113 (!) 92/32 mmHg 99.6 F (37.6 C) Axillary (!) 121 24 100 % 5\' 9"  (1.753 m) 167 lb 12.3 oz (76.1 kg)  03/14/15 1045 101/56 mmHg - - 119  21 96 % - -  03/14/15 1038 (!) 124/52 mmHg - - 120 21 93 % - -  03/14/15 1030 (!) 124/52 mmHg - - (!) 195 22 (!) 80 % - -  03/14/15 1015 (!) 125/107 mmHg - - 103 14 91 % - -  03/14/15 1000 (!) 100/52 mmHg - - 104 16 93 % - -  03/14/15 0945 (!) 103/48 mmHg - - 108 19 93 % - -  03/14/15 0930 (!) 95/52 mmHg - - 109 16 92 % - -  03/14/15 0924 (!) 105/46 mmHg - - 110 16 92 % - -  03/14/15 0915 99/61 mmHg - - 110 18 93 % - -  03/14/15 0900 (!) 113/51 mmHg - - 111 17 95 % - -  03/14/15 0854 - - - 119 26 - - -  03/14/15 0845 (!) 123/52 mmHg - - 118 (!) 33 96 % - -  03/14/15 0830 (!) 124/52 mmHg - - (!) 122 (!) 33 96 % - -  03/14/15 0820 (!) 118/53 mmHg - - (!) 123 (!) 36 94 % - -  03/14/15 0803 106/60 mmHg - - (!) 122 26 99 % - -    9:04 AM Reevaluation with update and discussion. After initial assessment and treatment, an updated evaluation reveals no change in clinical status. No response to Narcan. Family member, sister, updated and questions were answered. Ineta Sinning L    9:03 AM-Consult complete with Hospitalist. Patient case explained  and discussed. He agrees to admit patient for further evaluation and treatment. Call ended at 10:05  IV fluids, given for hypotension.   CRITICAL CARE Performed by: Richarda Blade Total critical care time: 65 minutes Critical care time was exclusive of separately billable procedures and treating other patients. Critical care was necessary to treat or prevent imminent or life-threatening deterioration. Critical care was time spent personally by me on the following activities: development of treatment plan with patient and/or surrogate as well as nursing, discussions with consultants, evaluation of patient's response to treatment, examination of patient, obtaining history from patient or surrogate, ordering and performing treatments and interventions, ordering and review of laboratory studies, ordering and review of radiographic studies, pulse oximetry and  re-evaluation of patient's condition.   Labs Review Labs Reviewed  BLOOD GAS, ARTERIAL - Abnormal; Notable for the following:    pH, Arterial 7.240 (*)    pCO2 arterial 91.5 (*)    Bicarbonate 37.8 (*)    Acid-Base Excess 10.4 (*)    All other components within normal limits  BASIC METABOLIC PANEL - Abnormal; Notable for the following:    Chloride 95 (*)    CO2 36 (*)    Glucose, Bld 129 (*)    BUN 22 (*)    Creatinine, Ser 1.56 (*)    GFR calc non Af Amer 32 (*)    GFR calc Af Amer 37 (*)    All other components within normal limits  CBC WITH DIFFERENTIAL/PLATELET - Abnormal; Notable for the following:    WBC 30.8 (*)    RBC 3.60 (*)    Hemoglobin 10.5 (*)    HCT 34.9 (*)    RDW 16.2 (*)    Platelets 436 (*)    Neutrophils Relative % 88 (*)    Lymphocytes Relative 3 (*)    Neutro Abs 27.1 (*)    Monocytes Absolute 2.8 (*)    All other components within normal limits  URINALYSIS, ROUTINE W REFLEX MICROSCOPIC - Abnormal; Notable for the following:    Color, Urine AMBER (*)    APPearance CLOUDY (*)    Specific Gravity, Urine >1.030 (*)    Bilirubin Urine SMALL (*)    Ketones, ur TRACE (*)    Protein, ur 100 (*)    All other components within normal limits  URINE MICROSCOPIC-ADD ON - Abnormal; Notable for the following:    Bacteria, UA MANY (*)    All other components within normal limits  BLOOD GAS, ARTERIAL - Abnormal; Notable for the following:    pH, Arterial 7.242 (*)    pCO2 arterial 81.0 (*)    pO2, Arterial 103 (*)    Bicarbonate 33.7 (*)    Acid-Base Excess 6.6 (*)    All other components within normal limits  GLUCOSE, CAPILLARY - Abnormal; Notable for the following:    Glucose-Capillary 153 (*)    All other components within normal limits  I-STAT CHEM 8, ED - Abnormal; Notable for the following:    Chloride 94 (*)    BUN 24 (*)    Creatinine, Ser 1.70 (*)    Glucose, Bld 128 (*)    Hemoglobin 11.9 (*)    HCT 35.0 (*)    All other components within  normal limits  MRSA PCR SCREENING  URINE CULTURE  CULTURE, BLOOD (ROUTINE X 2)  CULTURE, EXPECTORATED SPUTUM-ASSESSMENT  GRAM STAIN  CULTURE, BLOOD (ROUTINE X 2)  LACTIC ACID, PLASMA  LACTIC ACID, PLASMA  PROCALCITONIN  PROTIME-INR  APTT  HIV ANTIBODY (ROUTINE TESTING)  LEGIONELLA ANTIGEN, URINE  STREP PNEUMONIAE URINARY ANTIGEN  CBC  CBC WITH DIFFERENTIAL/PLATELET  HEMOGLOBIN A1C  I-STAT CG4 LACTIC ACID, ED  Randolm Idol, ED    Imaging Review Dg Chest 2 View  03/13/2015   CLINICAL DATA:  Chest and back pain  EXAM: CHEST  2 VIEW  COMPARISON:  February 11, 2015  FINDINGS: There is evidence of a degree of underlying emphysematous change. There is no edema or consolidation. Heart is upper normal in size with pulmonary vascularity within normal limits. There is atherosclerotic change in the aortic arch region. No adenopathy. Stable prior left sixth rib fracture. There is slight anterior wedging of a lower thoracic vertebral body.  IMPRESSION: No edema or consolidation.  Underlying emphysematous change.   Electronically Signed   By: Lowella Grip III M.D.   On: 03/13/2015 10:02   Dg Lumbar Spine Complete  03/13/2015   CLINICAL DATA:  Three-day history of lumbago  EXAM: LUMBAR SPINE - COMPLETE 4+ VIEW  COMPARISON:  CT abdomen and pelvis with bony reformats August 20, 2012  FINDINGS: Frontal, lateral, spot lumbosacral lateral, and bilateral oblique views were obtained. There are 5 non-rib-bearing lumbar type vertebral bodies. There is stable anterior wedging of the L1 vertebral body. There is mild concavity of the posterior superior endplate of the L4 vertebral body, stable. There is no new fracture. There is no spondylolisthesis. There is moderate disc space narrowing at T12-L1, L1-2, and L2-3. There is milder narrowing at L3-4 L4-5. There is facet osteoarthritic change at L3-4, L4-5, and L5-S1 bilaterally. There is atherosclerotic change in the aorta and iliac arteries.  IMPRESSION: Stable  anterior wedge compression fracture of L1. Stable posterior superior endplate concavity at L4. No new fracture. Moderate osteoarthritic change. No spondylolisthesis.   Electronically Signed   By: Lowella Grip III M.D.   On: 03/13/2015 10:15   Dg Chest Portable 1 View  03/14/2015   CLINICAL DATA:  Altered mental status and decreased oxygen saturation  EXAM: PORTABLE CHEST - 1 VIEW  COMPARISON:  March 13, 2015  FINDINGS: There is patchy interstitial and alveolar opacity throughout much of the right mid and lower lung zone. The left lung is clear. Heart is upper normal in size with pulmonary vascularity within normal limits. No adenopathy. There is atherosclerotic calcification in the aortic arch.  IMPRESSION: Patchy interstitial and alveolar opacity in the right mid and lower lung zones. The appearance is consistent with pneumonia. Aspiration could present in this manner as well. Left lung clear. No change in cardiac silhouette.   Electronically Signed   By: Lowella Grip III M.D.   On: 03/14/2015 08:38     EKG Interpretation None         Date: 03/14/15  Rate: 122  Rhythm: sinus tachycardia  QRS Axis: right  PR and QT Intervals: normal  ST/T Wave abnormalities: nonspecific ST abnormality  PR and QRS Conduction Disutrbances:none  Narrative Interpretation:   Old EKG Reviewed: changes noted- faster since yesterday   MDM   Final diagnoses:  Acute respiratory failure with hypercapnia  Aspiration pneumonia, unspecified aspiration pneumonia type  Transient alteration of awareness  AKI (acute kidney injury)    Acute respiratory failure with hypercapnia, cause not clear. Aspiration pneumonia is likely secondary. A she is DO NOT RESUSCITATE, therefore not a candidate for endotracheal intubation at this time. Incidental acute kidney injury, worsened since yesterday. It is unclear if the narcotics were prescribed yesterday are contributing to her respiratory  distress. She did not respond to  Narcan in the emergency department. Primary cause of altered mental status is likely the hypercapnia. Lactate is normal. Doubt ACS, significant metabolic instability or impending vascular collapse. Patient will require admission to stepdown versus ICU depending on the capabilities of the unit.  Nursing Notes Reviewed/ Care Coordinated, and agree without changes. Applicable Imaging Reviewed.  Interpretation of Laboratory Data incorporated into ED treatment  Plan: Admit to hospitalist service.    [name], MD 03/18/15 [phone]

## 2015-03-14 NOTE — ED Notes (Signed)
Duoneb complete, decreased wheezing noted in bilateral lungs. Pt continues to have course crackles. o2 sats around (3% after o2 increased to 5L via [place]

## 2015-03-14 NOTE — Progress Notes (Signed)
Asked MD if he wanted a new UA/culture after placement of foley. Denies need at this time.

## 2015-03-14 NOTE — Progress Notes (Signed)
ANTIBIOTIC CONSULT NOTE  Pharmacy Consult for Vancomycin Indication: HCAP/Sepsis  No Known Allergies  Patient Measurements: Height: 5\' 9"  (175.3 cm) Weight: 167 lb 12.3 oz (76.1 kg) IBW/kg (Calculated) : 66.2  Vital Signs: Temp: 99.6 F (37.6 C) (05/01 1113) Temp Source: Axillary (05/01 1113) BP: 92/32 mmHg (05/01 1113) Pulse Rate: 121 (05/01 1113) Intake/Output from previous day:   Intake/Output from this shift:    Labs:  Recent Labs  03/13/15 0915 03/14/15 0816 03/14/15 0826  WBC 9.4 30.8*  --   HGB 10.9* 10.5* 11.9*  PLT 336 436*  --   CREATININE 1.17* 1.56* 1.70*   Estimated Creatinine Clearance: 30.8 mL/min (by C-G formula based on Cr of 1.7). No results for input(s): VANCOTROUGH, VANCOPEAK, VANCORANDOM, GENTTROUGH, GENTPEAK, GENTRANDOM, TOBRATROUGH, TOBRAPEAK, TOBRARND, AMIKACINPEAK, AMIKACINTROU, AMIKACIN in the last 72 hours.   Microbiology: No results found for this or any previous visit (from the past 720 hour(s)).  Anti-infectives    Start     Dose/Rate Route Frequency Ordered Stop   03/15/15 1000  ceFEPIme (MAXIPIME) 1 g in dextrose 5 % 50 mL IVPB     1 g 100 mL/hr over 30 Minutes Intravenous Every 24 hours 03/14/15 1115 03/23/15 0959   03/14/15 1130  vancomycin (VANCOCIN) 1,500 mg in sodium chloride 0.9 % 500 mL IVPB     1,500 mg 250 mL/hr over 120 Minutes Intravenous  Once 03/14/15 1120     03/14/15 1030  vancomycin (VANCOCIN) IVPB 1000 mg/200 mL premix  Status:  Discontinued     1,000 mg 200 mL/hr over 60 Minutes Intravenous  Once 03/14/15 1027 03/14/15 1139   03/14/15 1030  ceFEPIme (MAXIPIME) 1 g in dextrose 5 % 50 mL IVPB     1 g 100 mL/hr over 30 Minutes Intravenous  Once 03/14/15 1027 03/14/15 1111   03/14/15 0915  clindamycin (CLEOCIN) IVPB 600 mg     600 mg 100 mL/hr over 30 Minutes Intravenous  Once 03/14/15 0900 03/14/15 0941      Assessment: Okay for Protocol, received Cefepime and Clindamycin in ED.  Worsening renal  function.  Goal of Therapy:  Vancomycin trough level 15-20 mcg/ml  Plan:  Adjusted Cefepime for renal function. Vancomycin 1500mg  IV x 1, then 1gm IV every 24 hours. Measure antibiotic drug levels at steady state Follow up culture results  [name] 03/14/2015,11:41 AM

## 2015-03-14 NOTE — Progress Notes (Signed)
Dr. Aileen Fass notified of current ABG. Orders given to continue BiPap but to discontinue ART line. Will notify RT.

## 2015-03-14 NOTE — ED Notes (Signed)
ABG results obtained from RT and given to Dr. Eulis Foster, after reviewing results Dr.Wents wants pt placed on bi-pap. Lorriane Shire RT notified.

## 2015-03-14 NOTE — ED Notes (Signed)
Pt appears slightly more awake after narcan no significant change.

## 2015-03-14 NOTE — Progress Notes (Signed)
Patient off BiPAP on 1lpm/, patient alert speaking ask for water. Able to talk to sister.

## 2015-03-15 LAB — CBC
HCT: 28.8 % — ABNORMAL LOW (ref 36.0–46.0)
Hemoglobin: 8.8 g/dL — ABNORMAL LOW (ref 12.0–15.0)
MCH: 29.8 pg (ref 26.0–34.0)
MCHC: 30.6 g/dL (ref 30.0–36.0)
MCV: 97.6 fL (ref 78.0–100.0)
Platelets: 338 10*3/uL (ref 150–400)
RBC: 2.95 MIL/uL — ABNORMAL LOW (ref 3.87–5.11)
RDW: 16.5 % — ABNORMAL HIGH (ref 11.5–15.5)
WBC: 28.2 10*3/uL — ABNORMAL HIGH (ref 4.0–10.5)

## 2015-03-15 LAB — BASIC METABOLIC PANEL
Anion gap: 7 (ref 5–15)
BUN: 27 mg/dL — ABNORMAL HIGH (ref 6–20)
CO2: 32 mmol/L (ref 22–32)
Calcium: 8.2 mg/dL — ABNORMAL LOW (ref 8.9–10.3)
Chloride: 103 mmol/L (ref 101–111)
Creatinine, Ser: 1.41 mg/dL — ABNORMAL HIGH (ref 0.44–1.00)
GFR, EST AFRICAN AMERICAN: 42 mL/min — AB (ref >60)
GFR, EST NON AFRICAN AMERICAN: 36 mL/min — AB (ref >60)
Glucose, Bld: 99 mg/dL (ref 70–99)
POTASSIUM: 4.3 mmol/L (ref 3.5–5.1)
Sodium: 142 mmol/L (ref 135–145)

## 2015-03-15 LAB — GLUCOSE, CAPILLARY
GLUCOSE-CAPILLARY: 100 mg/dL — AB (ref 70–99)
GLUCOSE-CAPILLARY: 187 mg/dL — AB (ref 70–99)
GLUCOSE-CAPILLARY: 223 mg/dL — AB (ref 70–99)
Glucose-Capillary: 105 mg/dL — ABNORMAL HIGH (ref 70–99)
Glucose-Capillary: 155 mg/dL — ABNORMAL HIGH (ref 70–99)
Glucose-Capillary: 95 mg/dL (ref 70–99)

## 2015-03-15 LAB — BLOOD GAS, ARTERIAL
Acid-Base Excess: 3.9 mmol/L — ABNORMAL HIGH (ref 0.0–2.0)
Bicarbonate: 31.3 mEq/L — ABNORMAL HIGH (ref 20.0–24.0)
O2 Content: 2 L/min
O2 SAT: 97.7 %
Patient temperature: 37
TCO2: 29.5 mmol/L (ref 0–100)
pCO2 arterial: 81.1 mmHg (ref 35.0–45.0)
pH, Arterial: 7.211 — ABNORMAL LOW (ref 7.350–7.450)
pO2, Arterial: 116 mmHg — ABNORMAL HIGH (ref 80.0–100.0)

## 2015-03-15 LAB — URINE CULTURE
CULTURE: NO GROWTH
Colony Count: NO GROWTH
Special Requests: NORMAL

## 2015-03-15 LAB — PROTIME-INR
INR: 1.18 (ref 0.00–1.49)
PROTHROMBIN TIME: 15.1 s (ref 11.6–15.2)

## 2015-03-15 LAB — LEGIONELLA ANTIGEN, URINE

## 2015-03-15 MED ORDER — METHYLPREDNISOLONE SODIUM SUCC 125 MG IJ SOLR
60.0000 mg | Freq: Four times a day (QID) | INTRAMUSCULAR | Status: DC
  Administered 2015-03-15 – 2015-03-20 (×20): 60 mg via INTRAVENOUS
  Filled 2015-03-15 (×20): qty 2

## 2015-03-15 NOTE — Progress Notes (Signed)
Subjective: She is admitted with healthcare associated pneumonia and acute on chronic respiratory failure. She has DO NOT RESUSCITATE status but did agree to BiPAP and seems to have improved.  Objective: Vital signs in last 24 hours: Temp:  [98.2 F (36.8 C)-99.7 F (37.6 C)] 98.2 F (36.8 C) (05/02 0400) Pulse Rate:  [85-195] 102 (05/02 0707) Resp:  [14-36] 19 (05/02 0600) BP: (91-143)/(26-107) 131/53 mmHg (05/02 0707) SpO2:  [80 %-100 %] 92 % (05/02 0705) FiO2 (%):  [30 %] 30 % (05/02 0705) Weight:  [76.1 kg (167 lb 12.3 oz)-80.3 kg (177 lb 0.5 oz)] 80.3 kg (177 lb 0.5 oz) (05/02 0500) Weight change:  Last BM Date:  (pt does not remember)  Intake/Output from previous day: 05/01 0701 - 05/02 0700 In: 3472.5 [I.V.:822.5; IV Piggyback:2650] Out: 350 [Urine:350]  PHYSICAL EXAM General appearance: alert, cooperative, moderate distress and On BiPAP Resp: She has bilateral rhonchi and wheezing Cardio: regular rate and rhythm, S1, S2 normal, no murmur, click, rub or gallop GI: soft, non-tender; bowel sounds normal; no masses,  no organomegaly Extremities: extremities normal, atraumatic, no cyanosis or edema  Lab Results:  Results for orders placed or performed during the hospital encounter of 03/14/15 (from the past 48 hour(s))  Urinalysis, Routine w reflex microscopic     Status: Abnormal   Collection Time: 03/14/15  8:15 AM  Result Value Ref Range   Color, Urine AMBER (A) YELLOW    Comment: BIOCHEMICALS MAY BE AFFECTED BY COLOR   APPearance CLOUDY (A) CLEAR   Specific Gravity, Urine >1.030 (H) 1.005 - 1.030   pH 5.0 5.0 - 8.0   Glucose, UA NEGATIVE NEGATIVE mg/dL   Hgb urine dipstick NEGATIVE NEGATIVE   Bilirubin Urine SMALL (A) NEGATIVE   Ketones, ur TRACE (A) NEGATIVE mg/dL   Protein, ur 100 (A) NEGATIVE mg/dL   Urobilinogen, UA 0.2 0.0 - 1.0 mg/dL   Nitrite NEGATIVE NEGATIVE   Leukocytes, UA NEGATIVE NEGATIVE  Urine microscopic-add on     Status: Abnormal   Collection  Time: 03/14/15  8:15 AM  Result Value Ref Range   WBC, UA 0-2 <3 WBC/hpf   RBC / HPF 0-2 <3 RBC/hpf   Bacteria, UA MANY (A) RARE  Basic metabolic panel     Status: Abnormal   Collection Time: 03/14/15  8:16 AM  Result Value Ref Range   Sodium 140 135 - 145 mmol/L   Potassium 4.3 3.5 - 5.1 mmol/L    Comment: DELTA CHECK NOTED   Chloride 95 (L) 101 - 111 mmol/L   CO2 36 (H) 22 - 32 mmol/L   Glucose, Bld 129 (H) 70 - 99 mg/dL   BUN 22 (H) 6 - 20 mg/dL   Creatinine, Ser 1.56 (H) 0.44 - 1.00 mg/dL   Calcium 9.3 8.9 - 10.3 mg/dL   GFR calc non Af Amer 32 (L) >60 mL/min   GFR calc Af Amer 37 (L) >60 mL/min    Comment: (NOTE) The eGFR has been calculated using the CKD EPI equation. This calculation has not been validated in all clinical situations. eGFR's persistently <90 mL/min signify possible Chronic Kidney Disease.    Anion gap 9 5 - 15  CBC with Differential     Status: Abnormal   Collection Time: 03/14/15  8:16 AM  Result Value Ref Range   WBC 30.8 (H) 4.0 - 10.5 K/uL   RBC 3.60 (L) 3.87 - 5.11 MIL/uL   Hemoglobin 10.5 (L) 12.0 - 15.0 g/dL   HCT 34.9 (  L) 36.0 - 46.0 %   MCV 96.9 78.0 - 100.0 fL   MCH 29.2 26.0 - 34.0 pg   MCHC 30.1 30.0 - 36.0 g/dL   RDW 16.2 (H) 11.5 - 15.5 %   Platelets 436 (H) 150 - 400 K/uL   Neutrophils Relative % 88 (H) 43 - 77 %   Lymphocytes Relative 3 (L) 12 - 46 %   Monocytes Relative 9 3 - 12 %   Eosinophils Relative 0 0 - 5 %   Basophils Relative 0 0 - 1 %   Neutro Abs 27.1 (H) 1.7 - 7.7 K/uL   Lymphs Abs 0.9 0.7 - 4.0 K/uL   Monocytes Absolute 2.8 (H) 0.1 - 1.0 K/uL   Eosinophils Absolute 0.0 0.0 - 0.7 K/uL   Basophils Absolute 0.0 0.0 - 0.1 K/uL   WBC Morphology      MODERATE LEFT SHIFT (>5% METAS AND MYELOS,OCC PRO NOTED)  I-stat troponin, ED     Status: None   Collection Time: 03/14/15  8:20 AM  Result Value Ref Range   Troponin i, poc 0.04 0.00 - 0.08 ng/mL   Comment 3            Comment: Due to the release kinetics of cTnI, a  negative result within the first hours of the onset of symptoms does not rule out myocardial infarction with certainty. If myocardial infarction is still suspected, repeat the test at appropriate intervals.   I-Stat CG4 Lactic Acid, ED     Status: None   Collection Time: 03/14/15  8:23 AM  Result Value Ref Range   Lactic Acid, Venous 1.78 0.5 - 2.0 mmol/L  Blood gas, arterial     Status: Abnormal   Collection Time: 03/14/15  8:26 AM  Result Value Ref Range   O2 Content 8.0 L/min   Delivery systems OXYGEN MASK    pH, Arterial 7.240 (L) 7.350 - 7.450   pCO2 arterial 91.5 (HH) 35.0 - 45.0 mmHg    Comment: CRITICAL RESULT CALLED TO, READ BACK BY AND VERIFIED WITH: MINDY MILLER,RN AT [id number] BY VANESSA LAWSON,RRT RCP ON 03/14/2015    pO2, Arterial 84.9 80.0 - 100.0 mmHg   Bicarbonate 37.8 (H) 20.0 - 24.0 mEq/L   TCO2 36.2 0 - 100 mmol/L   Acid-Base Excess 10.4 (H) 0.0 - 2.0 mmol/L   O2 Saturation 94.4 %   Patient temperature 37.0    Collection site RIGHT RADIAL    Drawn by [id number]    Sample type ARTERIAL    Allens test (pass/fail) PASS PASS  I-stat Chem 8, ED     Status: Abnormal   Collection Time: 03/14/15  8:26 AM  Result Value Ref Range   Sodium 139 135 - 145 mmol/L   Potassium 4.3 3.5 - 5.1 mmol/L   Chloride 94 (L) 101 - 111 mmol/L   BUN 24 (H) 6 - 20 mg/dL   Creatinine, Ser 1.70 (H) 0.44 - 1.00 mg/dL   Glucose, Bld 128 (H) 70 - 99 mg/dL   Calcium, Ion 1.18 1.13 - 1.30 mmol/L   TCO2 31 0 - 100 mmol/L   Hemoglobin 11.9 (L) 12.0 - 15.0 g/dL   HCT 35.0 (L) 36.0 - 46.0 %  MRSA PCR Screening     Status: None   Collection Time: 03/14/15 11:04 AM  Result Value Ref Range   MRSA by PCR NEGATIVE NEGATIVE    Comment:        The GeneXpert MRSA Assay (FDA approved for NASAL  specimens only), is one component of a comprehensive MRSA colonization surveillance program. It is not intended to diagnose MRSA infection nor to guide or monitor treatment for MRSA infections.   Glucose,  capillary     Status: Abnormal   Collection Time: 03/14/15 11:42 AM  Result Value Ref Range   Glucose-Capillary 153 (H) 70 - 99 mg/dL   Comment 1 Notify RN   Lactic acid, plasma     Status: None   Collection Time: 03/14/15 12:00 PM  Result Value Ref Range   Lactic Acid, Venous 1.7 0.5 - 2.0 mmol/L  Blood gas, arterial     Status: Abnormal   Collection Time: 03/14/15 12:25 PM  Result Value Ref Range   FIO2 0.45 %   Delivery systems BILEVEL POSITIVE AIRWAY PRESSURE    Inspiratory PAP 16.0    Expiratory PAP 8.0    pH, Arterial 7.242 (L) 7.350 - 7.450   pCO2 arterial 81.0 (HH) 35.0 - 45.0 mmHg    Comment: CRITICAL RESULT CALLED TO, READ BACK BY AND VERIFIED WITH:  NEDINE ROWE, RN AT [id number] BY JESSICA HALEY RRT RCP ON 03/14/2015    pO2, Arterial 103 (H) 80.0 - 100.0 mmHg   Bicarbonate 33.7 (H) 20.0 - 24.0 mEq/L   TCO2 32.5 0 - 100 mmol/L   Acid-Base Excess 6.6 (H) 0.0 - 2.0 mmol/L   O2 Saturation 97.0 %   Patient temperature 37.0    Collection site RIGHT RADIAL    Drawn by [phone]    Sample type ARTERIAL DRAW    Allens test (pass/fail) PASS PASS  Strep pneumoniae urinary antigen     Status: Abnormal   Collection Time: 03/14/15 12:28 PM  Result Value Ref Range   Strep Pneumo Urinary Antigen POSITIVE (A) NEGATIVE    Comment: (NOTE) MC Performed at [facility]   Lactic acid, plasma     Status: None   Collection Time: 03/14/15  2:30 PM  Result Value Ref Range   Lactic Acid, Venous 1.5 0.5 - 2.0 mmol/L  Procalcitonin     Status: None   Collection Time: 03/14/15  2:30 PM  Result Value Ref Range   Procalcitonin 6.70 ng/mL    Comment:        Interpretation: PCT > 2 ng/mL: Systemic infection (sepsis) is likely, unless other causes are known. (NOTE)         ICU PCT Algorithm               Non ICU PCT Algorithm    ----------------------------     ------------------------------         PCT < 0.25 ng/mL                 PCT < 0.1 ng/mL     Stopping of antibiotics             Stopping of antibiotics       strongly encouraged.               strongly encouraged.    ----------------------------     ------------------------------       PCT level decrease by               PCT < 0.25 ng/mL       >= 80% from peak PCT       OR PCT 0.25 - 0.5 ng/mL          Stopping of antibiotics  encouraged.     Stopping of antibiotics           encouraged.    ----------------------------     ------------------------------       PCT level decrease by              PCT >= 0.25 ng/mL       < 80% from peak PCT        AND PCT >= 0.5 ng/mL            Continuing antibiotics                                               encouraged.       Continuing antibiotics            encouraged.    ----------------------------     ------------------------------     PCT level increase compared          PCT > 0.5 ng/mL         with peak PCT AND          PCT >= 0.5 ng/mL             Escalation of antibiotics                                          strongly encouraged.      Escalation of antibiotics        strongly encouraged.   Protime-INR     Status: None   Collection Time: 03/14/15  2:30 PM  Result Value Ref Range   Prothrombin Time 13.9 11.6 - 15.2 seconds   INR 1.06 0.00 - 1.49  APTT     Status: None   Collection Time: 03/14/15  2:30 PM  Result Value Ref Range   aPTT 31 24 - 37 seconds  Glucose, capillary     Status: Abnormal   Collection Time: 03/14/15  4:16 PM  Result Value Ref Range   Glucose-Capillary 141 (H) 70 - 99 mg/dL   Comment 1 Notify RN   Glucose, capillary     Status: Abnormal   Collection Time: 03/14/15  7:44 PM  Result Value Ref Range   Glucose-Capillary 120 (H) 70 - 99 mg/dL   Comment 1 Notify RN   Glucose, capillary     Status: None   Collection Time: 03/15/15 12:40 AM  Result Value Ref Range   Glucose-Capillary 95 70 - 99 mg/dL  Glucose, capillary     Status: Abnormal   Collection Time: 03/15/15  4:10 AM  Result Value  Ref Range   Glucose-Capillary 100 (H) 70 - 99 mg/dL  CBC     Status: Abnormal   Collection Time: 03/15/15  4:43 AM  Result Value Ref Range   WBC 28.2 (H) 4.0 - 10.5 K/uL   RBC 2.95 (L) 3.87 - 5.11 MIL/uL   Hemoglobin 8.8 (L) 12.0 - 15.0 g/dL    Comment: DELTA CHECK NOTED RESULT REPEATED AND VERIFIED    HCT 28.8 (L) 36.0 - 46.0 %   MCV 97.6 78.0 - 100.0 fL   MCH 29.8 26.0 - 34.0 pg   MCHC 30.6 30.0 - 36.0 g/dL   RDW 16.5 (H) 11.5 - 15.5 %   Platelets 338 150 -  400 K/uL  Basic metabolic panel     Status: Abnormal   Collection Time: 03/15/15  4:43 AM  Result Value Ref Range   Sodium 142 135 - 145 mmol/L   Potassium 4.3 3.5 - 5.1 mmol/L   Chloride 103 101 - 111 mmol/L   CO2 32 22 - 32 mmol/L   Glucose, Bld 99 70 - 99 mg/dL   BUN 27 (H) 6 - 20 mg/dL   Creatinine, Ser 1.41 (H) 0.44 - 1.00 mg/dL   Calcium 8.2 (L) 8.9 - 10.3 mg/dL   GFR calc non Af Amer 36 (L) >60 mL/min   GFR calc Af Amer 42 (L) >60 mL/min    Comment: (NOTE) The eGFR has been calculated using the CKD EPI equation. This calculation has not been validated in all clinical situations. eGFR's persistently <90 mL/min signify possible Chronic Kidney Disease.    Anion gap 7 5 - 15  Protime-INR     Status: None   Collection Time: 03/15/15  4:43 AM  Result Value Ref Range   Prothrombin Time 15.1 11.6 - 15.2 seconds   INR 1.18 0.00 - 1.49  Blood gas, arterial     Status: Abnormal   Collection Time: 03/15/15  6:20 AM  Result Value Ref Range   O2 Content 2.0 L/min   Delivery systems NASAL CANNULA    pH, Arterial 7.211 (L) 7.350 - 7.450   pCO2 arterial 81.1 (HH) 35.0 - 45.0 mmHg    Comment: CRITICAL RESULT CALLED TO, READ BACK BY AND VERIFIED WITH:  Adrian Prince RN AT [id number] 03/15/2015 CRITICAL RESULT CALLED TO, READ BACK BY AND VERIFIED WITH:  TO HAMICK S RN AT [id number] 03/15/2015 BY [facility] RRT    pO2, Arterial 116 (H) 80.0 - 100.0 mmHg   Bicarbonate 31.3 (H) 20.0 - 24.0 mEq/L   TCO2 29.5 0 - 100 mmol/L   Acid-Base Excess 3.9  (H) 0.0 - 2.0 mmol/L   O2 Saturation 97.7 %   Patient temperature 37.0    Collection site BRACHIAL ARTERY    Drawn by COLLECTED BY RT    Sample type ARTERIAL    Allens test (pass/fail) NOT INDICATED (A) PASS    ABGS  Recent Labs  03/15/15 0620  PHART 7.211*  PO2ART 116*  TCO2 29.5  HCO3 31.3*   CULTURES Recent Results (from the past 240 hour(s))  MRSA PCR Screening     Status: None   Collection Time: 03/14/15 11:04 AM  Result Value Ref Range Status   MRSA by PCR NEGATIVE NEGATIVE Final    Comment:        The GeneXpert MRSA Assay (FDA approved for NASAL specimens only), is one component of a comprehensive MRSA colonization surveillance program. It is not intended to diagnose MRSA infection nor to guide or monitor treatment for MRSA infections.    Studies/Results: Dg Chest 2 View  03/13/2015   CLINICAL DATA:  Chest and back pain  EXAM: CHEST  2 VIEW  COMPARISON:  February 11, 2015  FINDINGS: There is evidence of a degree of underlying emphysematous change. There is no edema or consolidation. Heart is upper normal in size with pulmonary vascularity within normal limits. There is atherosclerotic change in the aortic arch region. No adenopathy. Stable prior left sixth rib fracture. There is slight anterior wedging of a lower thoracic vertebral body.  IMPRESSION: No edema or consolidation.  Underlying emphysematous change.   Electronically Signed   By: Lowella Grip III M.D.   On: 03/13/2015 10:02  Dg Lumbar Spine Complete  03/13/2015   CLINICAL DATA:  Three-day history of lumbago  EXAM: LUMBAR SPINE - COMPLETE 4+ VIEW  COMPARISON:  CT abdomen and pelvis with bony reformats August 20, 2012  FINDINGS: Frontal, lateral, spot lumbosacral lateral, and bilateral oblique views were obtained. There are 5 non-rib-bearing lumbar type vertebral bodies. There is stable anterior wedging of the L1 vertebral body. There is mild concavity of the posterior superior endplate of the L4 vertebral  body, stable. There is no new fracture. There is no spondylolisthesis. There is moderate disc space narrowing at T12-L1, L1-2, and L2-3. There is milder narrowing at L3-4 L4-5. There is facet osteoarthritic change at L3-4, L4-5, and L5-S1 bilaterally. There is atherosclerotic change in the aorta and iliac arteries.  IMPRESSION: Stable anterior wedge compression fracture of L1. Stable posterior superior endplate concavity at L4. No new fracture. Moderate osteoarthritic change. No spondylolisthesis.   Electronically Signed   By: Lowella Grip III M.D.   On: 03/13/2015 10:15   Dg Chest Portable 1 View  03/14/2015   CLINICAL DATA:  Altered mental status and decreased oxygen saturation  EXAM: PORTABLE CHEST - 1 VIEW  COMPARISON:  March 13, 2015  FINDINGS: There is patchy interstitial and alveolar opacity throughout much of the right mid and lower lung zone. The left lung is clear. Heart is upper normal in size with pulmonary vascularity within normal limits. No adenopathy. There is atherosclerotic calcification in the aortic arch.  IMPRESSION: Patchy interstitial and alveolar opacity in the right mid and lower lung zones. The appearance is consistent with pneumonia. Aspiration could present in this manner as well. Left lung clear. No change in cardiac silhouette.   Electronically Signed   By: Lowella Grip III M.D.   On: 03/14/2015 08:38    Medications:  Prior to Admission:  Prescriptions prior to admission  Medication Sig Dispense Refill Last Dose  . albuterol (PROVENTIL) (2.5 MG/3ML) 0.083% nebulizer solution Take 2.5 mg by nebulization 3 (three) times daily.   03/13/2015 at Unknown time  . alendronate (FOSAMAX) 70 MG tablet Take 70 mg by mouth every Friday. Take with a full glass of water on an empty stomach.   03/12/2015 at Unknown time  . ALPRAZolam (XANAX) 1 MG tablet Take 1 mg by mouth at bedtime.   03/13/2015 at Unknown time  . aspirin EC 81 MG tablet Take 81 mg by mouth daily.   03/12/2015 at  Unknown time  . diltiazem (TIAZAC) 240 MG 24 hr capsule Take 240 mg by mouth every morning.    03/12/2015 at Unknown time  . ferrous sulfate 325 (65 FE) MG tablet Take 325 mg by mouth daily.   03/12/2015 at Unknown time  . FLUoxetine (PROZAC) 20 MG tablet Take 20 mg by mouth every morning.    03/12/2015 at Unknown time  . fluticasone (FLONASE) 50 MCG/ACT nasal spray Place 2 sprays into the nose daily.   03/13/2015 at Unknown time  . furosemide (LASIX) 40 MG tablet Take 1 tablet (40 mg total) by mouth every morning. As needed for edema 30 tablet 5 03/12/2015 at Unknown time  . HYDROcodone-acetaminophen (NORCO/VICODIN) 5-325 MG per tablet Take 1 tablet by mouth every 6 (six) hours as needed. 15 tablet 0 03/13/2015 at Unknown time  . pantoprazole (PROTONIX) 40 MG tablet Take 1 tablet by mouth 2 (two) times daily.   03/13/2015 at Unknown time  . risperiDONE (RISPERDAL) 0.5 MG tablet Take 0.5 mg by mouth at bedtime.   03/13/2015  at Unknown time  . tiotropium (SPIRIVA) 18 MCG inhalation capsule Place 18 mcg into inhaler and inhale every morning.   03/13/2015 at Unknown time  . predniSONE (DELTASONE) 50 MG tablet One tablet PO daily for 4 days (Patient not taking: Reported on 03/13/2015) 4 tablet 0 Not Taking at Unknown time   Scheduled: . albuterol  2.5 mg Nebulization TID  . ALPRAZolam  1 mg Oral QHS  . antiseptic oral rinse  7 mL Mouth Rinse q12n4p  . aspirin EC  81 mg Oral Daily  . ceFEPime (MAXIPIME) IV  1 g Intravenous Q24H  . chlorhexidine  15 mL Mouth Rinse BID  . diltiazem  240 mg Oral q morning - 10a  . FLUoxetine  20 mg Oral q morning - 10a  . heparin  5,000 Units Subcutaneous 3 times per day  . insulin aspart  0-9 Units Subcutaneous 6 times per day  . pantoprazole  40 mg Oral BID  . risperiDONE  0.5 mg Oral QHS  . sodium chloride  10-40 mL Intracatheter Q12H  . sodium chloride  3 mL Intravenous Q12H  . tiotropium  18 mcg Inhalation q morning - 10a  . vancomycin  1,000 mg Intravenous Q24H  .  vancomycin  1,000 mg Intravenous Once   Continuous:  TCY:[id number] **OR** acetaminophen, ondansetron **OR** ondansetron (ZOFRAN) IV, sodium chloride  Assesment: She has healthcare associated pneumonia. She has acute hypercapnic respiratory failure on BiPAP. She appeared to be septic. She seems to be improving but blood gas this morning shows that she still has a pH of 7.21 with a PCO2 in the 80s.  She has been acutely encephalopathic and that is better.  She has acute kidney injury and her renal function has improved.  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  She is anemic and had workup of this during her last hospitalization Active Problems:   Sepsis   Acute renal insufficiency   Leukocytosis   HCAP (healthcare-associated pneumonia)   Acute respiratory failure with hypercapnia   Acute encephalopathy    Plan: Continue current treatments no change in medication    LOS: 1 day   Habib Kise L 03/15/2015, 7:46 AM

## 2015-03-15 NOTE — Care Management Utilization Note (Signed)
UR completed 

## 2015-03-15 NOTE — Care Management Note (Signed)
Case Management Note  Patient Details  Name: April Pearson MRN: [id number] Date of Birth: 04-24-1941  Subjective/Objective:                    Action/Plan: Pt is from [facility] home. Pt admitted with pnuemonia. Pt plans to return back to ALF at discharge. CSW is aware and arrange for return to facility at DC. Anticipate need for PT eval when appropriate. Will cont to follow.   Expected Discharge Date:  03/18/15               Expected Discharge Plan:  Assisted Living / Rest Home  In-House Referral:  Clinical Social Work  Discharge planning Services  CM Consult  Post Acute Care Choice:    Choice offered to:     DME Arranged:    DME Agency:     HH Arranged:    HH Agency:     Status of Service:  In process, will continue to follow  Medicare Important Message Given:    Date Medicare IM Given:    Medicare IM give by:    Date Additional Medicare IM Given:    Additional Medicare Important Message give by:     If discussed at [place] of Stay Meetings, dates discussed:    Additional Comments:  Sherald Barge, RN 03/15/2015, 3:30 PM

## 2015-03-15 NOTE — Clinical SW OB High Risk (Addendum)
CSW attempted to assess patient, however patient is on BiPap.  CSW will reassess again on tomorrow.     Ihor Gully, [place] [phone]

## 2015-03-15 NOTE — Plan of Care (Signed)
Problem: Phase I Progression Outcomes Goal: Pain controlled with appropriate interventions Outcome: Progressing Patient denies pain at this time Goal: OOB as tolerated unless otherwise ordered Outcome: Not Progressing Currently on bedrest Goal: Initial discharge plan identified Outcome: Completed/Met Date Met:  03/15/15 Back with [place] Goal: Voiding-avoid urinary catheter unless indicated Outcome: Progressing Currently has a catheter intact draining clear yellow urine Goal: Hemodynamically stable Outcome: Progressing Vital signs are stable

## 2015-03-16 LAB — GLUCOSE, CAPILLARY
GLUCOSE-CAPILLARY: 160 mg/dL — AB (ref 70–99)
GLUCOSE-CAPILLARY: 187 mg/dL — AB (ref 70–99)
Glucose-Capillary: 143 mg/dL — ABNORMAL HIGH (ref 70–99)
Glucose-Capillary: 169 mg/dL — ABNORMAL HIGH (ref 70–99)
Glucose-Capillary: 194 mg/dL — ABNORMAL HIGH (ref 70–99)
Glucose-Capillary: 199 mg/dL — ABNORMAL HIGH (ref 70–99)

## 2015-03-16 LAB — BLOOD GAS, ARTERIAL
ACID-BASE EXCESS: 5.1 mmol/L — AB (ref 0.0–2.0)
BICARBONATE: 31.6 meq/L — AB (ref 20.0–24.0)
Delivery systems: POSITIVE
Drawn by: 317771
Expiratory PAP: 5
FIO2: 0.3 %
INSPIRATORY PAP: 12
O2 SAT: 90.8 %
Patient temperature: 37
TCO2: 29.5 mmol/L (ref 0–100)
pCO2 arterial: 71.4 mmHg (ref 35.0–45.0)
pH, Arterial: 7.269 — ABNORMAL LOW (ref 7.350–7.450)
pO2, Arterial: 63.7 mmHg — ABNORMAL LOW (ref 80.0–100.0)

## 2015-03-16 LAB — HEMOGLOBIN A1C
Hgb A1c MFr Bld: 4.9 % (ref 4.8–5.6)
MEAN PLASMA GLUCOSE: 94 mg/dL

## 2015-03-16 LAB — HIV ANTIBODY (ROUTINE TESTING W REFLEX): HIV SCREEN 4TH GENERATION: NONREACTIVE

## 2015-03-16 MED ORDER — DEXTROSE 5 % IV SOLN
1.0000 g | INTRAVENOUS | Status: DC
  Administered 2015-03-16 – 2015-03-21 (×6): 1 g via INTRAVENOUS
  Filled 2015-03-16 (×11): qty 10

## 2015-03-16 MED ORDER — INSULIN ASPART 100 UNIT/ML ~~LOC~~ SOLN
0.0000 [IU] | Freq: Three times a day (TID) | SUBCUTANEOUS | Status: DC
  Administered 2015-03-16 (×2): 2 [IU] via SUBCUTANEOUS
  Administered 2015-03-17: 1 [IU] via SUBCUTANEOUS
  Administered 2015-03-17 (×2): 2 [IU] via SUBCUTANEOUS
  Administered 2015-03-18: 1 [IU] via SUBCUTANEOUS
  Administered 2015-03-18: 2 [IU] via SUBCUTANEOUS
  Administered 2015-03-18: 1 [IU] via SUBCUTANEOUS
  Administered 2015-03-19 (×3): 2 [IU] via SUBCUTANEOUS
  Administered 2015-03-19: 3 [IU] via SUBCUTANEOUS
  Administered 2015-03-20: 1 [IU] via SUBCUTANEOUS
  Administered 2015-03-20 (×3): 2 [IU] via SUBCUTANEOUS
  Administered 2015-03-21: 1 [IU] via SUBCUTANEOUS
  Administered 2015-03-21: 2 [IU] via SUBCUTANEOUS
  Administered 2015-03-21 (×2): 1 [IU] via SUBCUTANEOUS
  Administered 2015-03-22: 2 [IU] via SUBCUTANEOUS

## 2015-03-16 NOTE — Plan of Care (Signed)
Problem: Phase I Progression Outcomes Goal: Pain controlled with appropriate interventions Outcome: Completed/Met Date Met:  03/16/15 Po meds are controling pain

## 2015-03-16 NOTE — Plan of Care (Signed)
Problem: Phase I Progression Outcomes Goal: OOB as tolerated unless otherwise ordered Outcome: Not Progressing Pt is sob on exertion from moving around in bed. Not ready to tolerated being oob Goal: Voiding-avoid urinary catheter unless indicated Outcome: Not Progressing Pt remains too sob w/ excertion to tolerate bed to void. Goal: Hemodynamically stable Outcome: Completed/Met Date Met:  03/16/15 Vs are stable     

## 2015-03-16 NOTE — Clinical Social Work Note (Signed)
Clinical Social Work Assessment  Patient Details  Name: April Pearson MRN: [id number] Date of Birth: 05/16/1941  Date of referral:  03/16/15               Reason for consult:  Facility Placement                Permission sought to share information with:    Permission granted to share information::     Name::        Agency::     Relationship::     Contact Information:     Housing/Transportation Living arrangements for the past 2 months:    Source of Information:    Patient Interpreter Needed:    Criminal Activity/Legal Involvement Pertinent to Current Situation/Hospitalization:    Significant Relationships:    Lives with:  Facility Resident Do you feel safe going back to the place where you live?  Yes Need for family participation in patient care:  Yes (Comment) (Family needs to continue visiting with patient as they have been.)  Care giving concerns:  Patient is in an assisted living setting.     Social Worker assessment / plan:  CSW met with patient who was alert and oriented.  Patient advised that she is a resident at [facility].  She indicated that she has been there for the past three weeks.  She indicated that she had previously been a resident at [facility] for eight years.  She indicated that she had to move due to the facility closing.  Patient stated that she ambulates using a walker and a wheelchair when she goes to doctor's appointments.  Patient indicated that her sisters visit with her often.  She indicated that she has not been in the hospital in a long time and then all of a sudden she just started getting sick.  She indicated that she wanted to go back to the facility upon discharge.  CSW spoke with Winifred Olive at [facility]. She confirmed patient's statements.  She advised that patient can return to the facility upon discharge.   Employment status:  Retired Nurse, adult PT Recommendations:  Not assessed at this  time Information / Referral to community resources:     Patient/Family's Response to care:  Patient is agreeable to return to SNF.  Patient/Family's Understanding of and Emotional Response to Diagnosis, Current Treatment, and Prognosis:  Patient indicates that she does not know why she cannot get completely well as she has not been ill in many years.   Emotional Assessment Appearance:  Developmentally appropriate Attitude/Demeanor/Rapport:   (Patient was cooperative and pleasant.) Affect (typically observed):  Calm Orientation:  Oriented to Self, Oriented to Place, Oriented to  Time Alcohol / Substance use:  Never Used Psych involvement (Current and /or in the community):  No (Comment)  Discharge Needs  Concerns to be addressed:  Discharge Planning Concerns Readmission within the last 30 days:  Yes Current discharge risk:  None Barriers to Discharge:  No Barriers Identified   Ihor Gully, LCSW 03/16/2015, 12:42 PM  [phone]

## 2015-03-16 NOTE — Progress Notes (Signed)
BiPAP was removed at 6.10 am per patient's request. Gray applied.

## 2015-03-16 NOTE — Progress Notes (Signed)
Subjective: She says she feels better. She used BiPAP overnight but is on nasal cannula now. She has no other new complaints. Her urine shows positive streptococcal antigen.  Objective: Vital signs in last 24 hours: Temp:  [97.4 F (36.3 C)-99 F (37.2 C)] 98.1 F (36.7 C) (05/03 0800) Pulse Rate:  [68-106] 77 (05/03 0800) Resp:  [14-32] 20 (05/03 0800) BP: (83-159)/(53-88) 136/69 mmHg (05/03 0800) SpO2:  [88 %-100 %] 99 % (05/03 0814) FiO2 (%):  [30 %] 30 % (05/02 1429) Weight:  [80.5 kg (177 lb 7.5 oz)] 80.5 kg (177 lb 7.5 oz) (05/03 0500) Weight change: 4.4 kg (9 lb 11.2 oz) Last BM Date: 03/15/15  Intake/Output from previous day: 05/02 0701 - 05/03 0700 In: 3370 [P.O.:2520; IV Piggyback:250] Out: 500 [Urine:500]  PHYSICAL EXAM General appearance: alert, cooperative and mild distress Resp: rhonchi bilaterally Cardio: regular rate and rhythm, S1, S2 normal, no murmur, click, rub or gallop GI: soft, non-tender; bowel sounds normal; no masses,  no organomegaly Extremities: extremities normal, atraumatic, no cyanosis or edema  Lab Results:  Results for orders placed or performed during the hospital encounter of 03/14/15 (from the past 48 hour(s))  MRSA PCR Screening     Status: None   Collection Time: 03/14/15 11:04 AM  Result Value Ref Range   MRSA by PCR NEGATIVE NEGATIVE    Comment:        The GeneXpert MRSA Assay (FDA approved for NASAL specimens only), is one component of a comprehensive MRSA colonization surveillance program. It is not intended to diagnose MRSA infection nor to guide or monitor treatment for MRSA infections.   Glucose, capillary     Status: Abnormal   Collection Time: 03/14/15 11:42 AM  Result Value Ref Range   Glucose-Capillary 153 (H) 70 - 99 mg/dL   Comment 1 Notify RN   Culture, blood (x 2)     Status: None (Preliminary result)   Collection Time: 03/14/15 12:00 PM  Result Value Ref Range   Specimen Description BLOOD RIGHT HAND     Special Requests BOTTLES DRAWN AEROBIC AND ANAEROBIC 6CC    Culture NO GROWTH 1 DAY    Report Status PENDING   Lactic acid, plasma     Status: None   Collection Time: 03/14/15 12:00 PM  Result Value Ref Range   Lactic Acid, Venous 1.7 0.5 - 2.0 mmol/L  Blood gas, arterial     Status: Abnormal   Collection Time: 03/14/15 12:25 PM  Result Value Ref Range   FIO2 0.45 %   Delivery systems BILEVEL POSITIVE AIRWAY PRESSURE    Inspiratory PAP 16.0    Expiratory PAP 8.0    pH, Arterial 7.242 (L) 7.350 - 7.450   pCO2 arterial 81.0 (HH) 35.0 - 45.0 mmHg    Comment: CRITICAL RESULT CALLED TO, READ BACK BY AND VERIFIED WITH:  NEDINE ROWE, RN AT [id number] BY JESSICA HALEY RRT RCP ON 03/14/2015    pO2, Arterial 103 (H) 80.0 - 100.0 mmHg   Bicarbonate 33.7 (H) 20.0 - 24.0 mEq/L   TCO2 32.5 0 - 100 mmol/L   Acid-Base Excess 6.6 (H) 0.0 - 2.0 mmol/L   O2 Saturation 97.0 %   Patient temperature 37.0    Collection site RIGHT RADIAL    Drawn by [id number]    Sample type ARTERIAL DRAW    Allens test (pass/fail) PASS PASS  Legionella antigen, urine     Status: None   Collection Time: 03/14/15 12:28 PM  Result Value Ref Range  Specimen Description URINE, CATHETERIZED    Special Requests NONE    Legionella Antigen, Urine      Negative for Legionella pneumophila serogroup 1                                                              Legionella pneumophila serogroup 1 antigen can be detected in urine within 2 to 3 days of infection and may persist even after treatment. This  assay does not detect other Legionella species or serogroups. Performed at [facility]    Report Status 03/15/2015 FINAL   Strep pneumoniae urinary antigen     Status: Abnormal   Collection Time: 03/14/15 12:28 PM  Result Value Ref Range   Strep Pneumo Urinary Antigen POSITIVE (A) NEGATIVE    Comment: (NOTE) MC Performed at [facility]   Hemoglobin A1c     Status: None   Collection Time: 03/14/15  2:20 PM  Result  Value Ref Range   Hgb A1c MFr Bld 4.9 4.8 - 5.6 %    Comment: (NOTE)         Pre-diabetes: 5.7 - 6.4         Diabetes: >6.4         Glycemic control for adults with diabetes: <7.0    Mean Plasma Glucose 94 mg/dL    Comment: (NOTE) Performed At: [facility] [place], [place] [id number] Lindon Romp MD EH:[id number]   Culture, blood (routine x 2) Call MD if unable to obtain prior to antibiotics being given     Status: None (Preliminary result)   Collection Time: 03/14/15  2:30 PM  Result Value Ref Range   Specimen Description BLOOD PORTA CATH DRAWN BY RN    Special Requests BOTTLES DRAWN AEROBIC AND ANAEROBIC 6CC    Culture NO GROWTH 1 DAY    Report Status PENDING   HIV antibody     Status: None   Collection Time: 03/14/15  2:30 PM  Result Value Ref Range   HIV Screen 4th Generation wRfx Non Reactive Non Reactive    Comment: (NOTE) Performed At: [facility] [address] [place], [place] [id number] Lindon Romp MD YY:[id number]   Lactic acid, plasma     Status: None   Collection Time: 03/14/15  2:30 PM  Result Value Ref Range   Lactic Acid, Venous 1.5 0.5 - 2.0 mmol/L  Procalcitonin     Status: None   Collection Time: 03/14/15  2:30 PM  Result Value Ref Range   Procalcitonin 6.70 ng/mL    Comment:        Interpretation: PCT > 2 ng/mL: Systemic infection (sepsis) is likely, unless other causes are known. (NOTE)         ICU PCT Algorithm               Non ICU PCT Algorithm    ----------------------------     ------------------------------         PCT < 0.25 ng/mL                 PCT < 0.1 ng/mL     Stopping of antibiotics            Stopping of antibiotics       strongly encouraged.  strongly encouraged.    ----------------------------     ------------------------------       PCT level decrease by               PCT < 0.25 ng/mL       >= 80% from peak PCT       OR PCT 0.25 - 0.5 ng/mL          Stopping of  antibiotics                                             encouraged.     Stopping of antibiotics           encouraged.    ----------------------------     ------------------------------       PCT level decrease by              PCT >= 0.25 ng/mL       < 80% from peak PCT        AND PCT >= 0.5 ng/mL            Continuing antibiotics                                               encouraged.       Continuing antibiotics            encouraged.    ----------------------------     ------------------------------     PCT level increase compared          PCT > 0.5 ng/mL         with peak PCT AND          PCT >= 0.5 ng/mL             Escalation of antibiotics                                          strongly encouraged.      Escalation of antibiotics        strongly encouraged.   Protime-INR     Status: None   Collection Time: 03/14/15  2:30 PM  Result Value Ref Range   Prothrombin Time 13.9 11.6 - 15.2 seconds   INR 1.06 0.00 - 1.49  APTT     Status: None   Collection Time: 03/14/15  2:30 PM  Result Value Ref Range   aPTT 31 24 - 37 seconds  Glucose, capillary     Status: Abnormal   Collection Time: 03/14/15  4:16 PM  Result Value Ref Range   Glucose-Capillary 141 (H) 70 - 99 mg/dL   Comment 1 Notify RN   Glucose, capillary     Status: Abnormal   Collection Time: 03/14/15  7:44 PM  Result Value Ref Range   Glucose-Capillary 120 (H) 70 - 99 mg/dL   Comment 1 Notify RN   Glucose, capillary     Status: None   Collection Time: 03/15/15 12:40 AM  Result Value Ref Range   Glucose-Capillary 95 70 - 99 mg/dL  Glucose, capillary     Status: Abnormal   Collection Time: 03/15/15  4:10 AM  Result Value Ref  Range   Glucose-Capillary 100 (H) 70 - 99 mg/dL  CBC     Status: Abnormal   Collection Time: 03/15/15  4:43 AM  Result Value Ref Range   WBC 28.2 (H) 4.0 - 10.5 K/uL   RBC 2.95 (L) 3.87 - 5.11 MIL/uL   Hemoglobin 8.8 (L) 12.0 - 15.0 g/dL    Comment: DELTA CHECK NOTED RESULT REPEATED  AND VERIFIED    HCT 28.8 (L) 36.0 - 46.0 %   MCV 97.6 78.0 - 100.0 fL   MCH 29.8 26.0 - 34.0 pg   MCHC 30.6 30.0 - 36.0 g/dL   RDW 16.5 (H) 11.5 - 15.5 %   Platelets 338 150 - 400 K/uL  Basic metabolic panel     Status: Abnormal   Collection Time: 03/15/15  4:43 AM  Result Value Ref Range   Sodium 142 135 - 145 mmol/L   Potassium 4.3 3.5 - 5.1 mmol/L   Chloride 103 101 - 111 mmol/L   CO2 32 22 - 32 mmol/L   Glucose, Bld 99 70 - 99 mg/dL   BUN 27 (H) 6 - 20 mg/dL   Creatinine, Ser 1.41 (H) 0.44 - 1.00 mg/dL   Calcium 8.2 (L) 8.9 - 10.3 mg/dL   GFR calc non Af Amer 36 (L) >60 mL/min   GFR calc Af Amer 42 (L) >60 mL/min    Comment: (NOTE) The eGFR has been calculated using the CKD EPI equation. This calculation has not been validated in all clinical situations. eGFR's persistently <90 mL/min signify possible Chronic Kidney Disease.    Anion gap 7 5 - 15  Protime-INR     Status: None   Collection Time: 03/15/15  4:43 AM  Result Value Ref Range   Prothrombin Time 15.1 11.6 - 15.2 seconds   INR 1.18 0.00 - 1.49  Blood gas, arterial     Status: Abnormal   Collection Time: 03/15/15  6:20 AM  Result Value Ref Range   O2 Content 2.0 L/min   Delivery systems NASAL CANNULA    pH, Arterial 7.211 (L) 7.350 - 7.450   pCO2 arterial 81.1 (HH) 35.0 - 45.0 mmHg    Comment: CRITICAL RESULT CALLED TO, READ BACK BY AND VERIFIED WITH:  Adrian Prince RN AT 1610 03/15/2015 CRITICAL RESULT CALLED TO, READ BACK BY AND VERIFIED WITH:  TO HAMICK S RN AT [phone] 03/15/2015 BY [facility] RRT    pO2, Arterial 116 (H) 80.0 - 100.0 mmHg   Bicarbonate 31.3 (H) 20.0 - 24.0 mEq/L   TCO2 29.5 0 - 100 mmol/L   Acid-Base Excess 3.9 (H) 0.0 - 2.0 mmol/L   O2 Saturation 97.7 %   Patient temperature 37.0    Collection site BRACHIAL ARTERY    Drawn by COLLECTED BY RT    Sample type ARTERIAL    Allens test (pass/fail) NOT INDICATED (A) PASS  Glucose, capillary     Status: Abnormal   Collection Time: 03/15/15  8:16 AM   Result Value Ref Range   Glucose-Capillary 105 (H) 70 - 99 mg/dL   Comment 1 Notify RN    Comment 2 Document in Chart   Glucose, capillary     Status: Abnormal   Collection Time: 03/15/15 11:31 AM  Result Value Ref Range   Glucose-Capillary 155 (H) 70 - 99 mg/dL   Comment 1 Notify RN    Comment 2 Document in Chart   Glucose, capillary     Status: Abnormal   Collection Time: 03/15/15  4:34 PM  Result Value Ref Range   Glucose-Capillary 187 (H) 70 - 99 mg/dL   Comment 1 Notify RN    Comment 2 Document in Chart   Glucose, capillary     Status: Abnormal   Collection Time: 03/15/15  7:23 PM  Result Value Ref Range   Glucose-Capillary 223 (H) 70 - 99 mg/dL  Glucose, capillary     Status: Abnormal   Collection Time: 03/16/15 12:00 AM  Result Value Ref Range   Glucose-Capillary 194 (H) 70 - 99 mg/dL  Blood gas, arterial     Status: Abnormal   Collection Time: 03/16/15  3:54 AM  Result Value Ref Range   FIO2 0.30 %   Delivery systems BILEVEL POSITIVE AIRWAY PRESSURE    Inspiratory PAP 12    Expiratory PAP 5    pH, Arterial 7.269 (L) 7.350 - 7.450   pCO2 arterial 71.4 (HH) 35.0 - 45.0 mmHg    Comment: CRITICAL RESULT CALLED TO, READ BACK BY AND VERIFIED WITH: OLGA MCLEOD,RN BY BFRETWELL RRT,RCP ON 03/16/15 AT 0403    pO2, Arterial 63.7 (L) 80.0 - 100.0 mmHg   Bicarbonate 31.6 (H) 20.0 - 24.0 mEq/L   TCO2 29.5 0 - 100 mmol/L   Acid-Base Excess 5.1 (H) 0.0 - 2.0 mmol/L   O2 Saturation 90.8 %   Patient temperature 37.0    Collection site LEFT RADIAL    Drawn by [id number]    Sample type ARTERIAL DRAW    Allens test (pass/fail) PASS PASS  Glucose, capillary     Status: Abnormal   Collection Time: 03/16/15  4:13 AM  Result Value Ref Range   Glucose-Capillary 160 (H) 70 - 99 mg/dL  Glucose, capillary     Status: Abnormal   Collection Time: 03/16/15  8:13 AM  Result Value Ref Range   Glucose-Capillary 143 (H) 70 - 99 mg/dL   Comment 1 Notify RN    Comment 2 Document in Chart      ABGS  Recent Labs  03/16/15 0354  PHART 7.269*  PO2ART 63.7*  TCO2 29.5  HCO3 31.6*   CULTURES Recent Results (from the past 240 hour(s))  Urine culture     Status: None   Collection Time: 03/14/15  8:15 AM  Result Value Ref Range Status   Specimen Description URINE, CATHETERIZED  Final   Special Requests Normal  Final   Colony Count NO GROWTH Performed at [facility]   Final   Culture NO GROWTH Performed at [facility]   Final   Report Status 03/15/2015 FINAL  Final  MRSA PCR Screening     Status: None   Collection Time: 03/14/15 11:04 AM  Result Value Ref Range Status   MRSA by PCR NEGATIVE NEGATIVE Final    Comment:        The GeneXpert MRSA Assay (FDA approved for NASAL specimens only), is one component of a comprehensive MRSA colonization surveillance program. It is not intended to diagnose MRSA infection nor to guide or monitor treatment for MRSA infections.   Culture, blood (x 2)     Status: None (Preliminary result)   Collection Time: 03/14/15 12:00 PM  Result Value Ref Range Status   Specimen Description BLOOD RIGHT HAND  Final   Special Requests BOTTLES DRAWN AEROBIC AND ANAEROBIC 6CC  Final   Culture NO GROWTH 1 DAY  Final   Report Status PENDING  Incomplete  Culture, blood (routine x 2) Call MD if unable to obtain prior to antibiotics being given  Status: None (Preliminary result)   Collection Time: 03/14/15  2:30 PM  Result Value Ref Range Status   Specimen Description BLOOD PORTA CATH DRAWN BY RN  Final   Special Requests BOTTLES DRAWN AEROBIC AND ANAEROBIC 6CC  Final   Culture NO GROWTH 1 DAY  Final   Report Status PENDING  Incomplete   Studies/Results: No results found.  Medications:  Prior to Admission:  Prescriptions prior to admission  Medication Sig Dispense Refill Last Dose  . albuterol (PROVENTIL) (2.5 MG/3ML) 0.083% nebulizer solution Take 2.5 mg by nebulization 3 (three) times daily.   03/13/2015 at Unknown  time  . alendronate (FOSAMAX) 70 MG tablet Take 70 mg by mouth every Friday. Take with a full glass of water on an empty stomach.   03/12/2015 at Unknown time  . ALPRAZolam (XANAX) 1 MG tablet Take 1 mg by mouth at bedtime.   03/13/2015 at Unknown time  . aspirin EC 81 MG tablet Take 81 mg by mouth daily.   03/12/2015 at Unknown time  . diltiazem (TIAZAC) 240 MG 24 hr capsule Take 240 mg by mouth every morning.    03/12/2015 at Unknown time  . ferrous sulfate 325 (65 FE) MG tablet Take 325 mg by mouth daily.   03/12/2015 at Unknown time  . FLUoxetine (PROZAC) 20 MG tablet Take 20 mg by mouth every morning.    03/12/2015 at Unknown time  . fluticasone (FLONASE) 50 MCG/ACT nasal spray Place 2 sprays into the nose daily.   03/13/2015 at Unknown time  . furosemide (LASIX) 40 MG tablet Take 1 tablet (40 mg total) by mouth every morning. As needed for edema 30 tablet 5 03/12/2015 at Unknown time  . HYDROcodone-acetaminophen (NORCO/VICODIN) 5-325 MG per tablet Take 1 tablet by mouth every 6 (six) hours as needed. 15 tablet 0 03/13/2015 at Unknown time  . pantoprazole (PROTONIX) 40 MG tablet Take 1 tablet by mouth 2 (two) times daily.   03/13/2015 at Unknown time  . risperiDONE (RISPERDAL) 0.5 MG tablet Take 0.5 mg by mouth at bedtime.   03/13/2015 at Unknown time  . tiotropium (SPIRIVA) 18 MCG inhalation capsule Place 18 mcg into inhaler and inhale every morning.   03/13/2015 at Unknown time  . predniSONE (DELTASONE) 50 MG tablet One tablet PO daily for 4 days (Patient not taking: Reported on 03/13/2015) 4 tablet 0 Not Taking at Unknown time   Scheduled: . albuterol  2.5 mg Nebulization TID  . ALPRAZolam  1 mg Oral QHS  . antiseptic oral rinse  7 mL Mouth Rinse q12n4p  . aspirin EC  81 mg Oral Daily  . ceFEPime (MAXIPIME) IV  1 g Intravenous Q24H  . chlorhexidine  15 mL Mouth Rinse BID  . diltiazem  240 mg Oral q morning - 10a  . FLUoxetine  20 mg Oral q morning - 10a  . heparin  5,000 Units Subcutaneous 3 times  per day  . insulin aspart  0-9 Units Subcutaneous 6 times per day  . methylPREDNISolone (SOLU-MEDROL) injection  60 mg Intravenous Q6H  . pantoprazole  40 mg Oral BID  . risperiDONE  0.5 mg Oral QHS  . sodium chloride  10-40 mL Intracatheter Q12H  . sodium chloride  3 mL Intravenous Q12H  . tiotropium  18 mcg Inhalation q morning - 10a  . vancomycin  1,000 mg Intravenous Q24H  . vancomycin  1,000 mg Intravenous Once   Continuous:  April Pearson **OR** acetaminophen, ondansetron **OR** ondansetron (ZOFRAN) IV, sodium chloride  Assesment:  She was admitted with healthcare associated pneumonia. This appears to be streptococcal. She is improving. 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  She had acute renal insufficiency and that is improving.  She had metabolic encephalopathy that is better Active Problems:   Sepsis   Acute renal insufficiency   Leukocytosis   HCAP (healthcare-associated pneumonia)   Acute respiratory failure with hypercapnia   Acute encephalopathy    Plan: Continue BiPAP as needed. Advance her diet. Narrow her antibiotics. She's not ready to move out of stepdown unit    LOS: 2 days   Rily Nickey L 03/16/2015, 8:53 AM

## 2015-03-17 LAB — GLUCOSE, CAPILLARY
GLUCOSE-CAPILLARY: 192 mg/dL — AB (ref 70–99)
Glucose-Capillary: 151 mg/dL — ABNORMAL HIGH (ref 70–99)
Glucose-Capillary: 155 mg/dL — ABNORMAL HIGH (ref 70–99)
Glucose-Capillary: 139 mg/dL — ABNORMAL HIGH (ref 70–99)

## 2015-03-17 NOTE — Progress Notes (Signed)
Subjective: She says she feels a little better. She has no new complaints.  Objective: Vital signs in last 24 hours: Temp:  [97.3 F (36.3 C)-98.1 F (36.7 C)] 97.3 F (36.3 C) (05/04 0400) Pulse Rate:  [74-83] 74 (05/04 0100) Resp:  [14-26] 18 (05/04 0100) BP: (117-155)/(57-110) 147/61 mmHg (05/04 0100) SpO2:  [94 %-100 %] 98 % (05/04 0649) Weight:  [80.8 kg (178 lb 2.1 oz)] 80.8 kg (178 lb 2.1 oz) (05/04 0500) Weight change: 0.3 kg (10.6 oz) Last BM Date: 03/15/15  Intake/Output from previous day: 05/03 0701 - 05/04 0700 In: 1945 [P.O.:1320; IV Piggyback:50] Out: 600 [Urine:600]  PHYSICAL EXAM General appearance: alert, cooperative and mild distress Resp: rhonchi bilaterally Cardio: regular rate and rhythm, S1, S2 normal, no murmur, click, rub or gallop GI: soft, non-tender; bowel sounds normal; no masses,  no organomegaly Extremities: extremities normal, atraumatic, no cyanosis or edema  Lab Results:  Results for orders placed or performed during the hospital encounter of 03/14/15 (from the past 48 hour(s))  Glucose, capillary     Status: Abnormal   Collection Time: 03/15/15  8:16 AM  Result Value Ref Range   Glucose-Capillary 105 (H) 70 - 99 mg/dL   Comment 1 Notify RN    Comment 2 Document in Chart   Glucose, capillary     Status: Abnormal   Collection Time: 03/15/15 11:31 AM  Result Value Ref Range   Glucose-Capillary 155 (H) 70 - 99 mg/dL   Comment 1 Notify RN    Comment 2 Document in Chart   Glucose, capillary     Status: Abnormal   Collection Time: 03/15/15  4:34 PM  Result Value Ref Range   Glucose-Capillary 187 (H) 70 - 99 mg/dL   Comment 1 Notify RN    Comment 2 Document in Chart   Glucose, capillary     Status: Abnormal   Collection Time: 03/15/15  7:23 PM  Result Value Ref Range   Glucose-Capillary 223 (H) 70 - 99 mg/dL  Glucose, capillary     Status: Abnormal   Collection Time: 03/16/15 12:00 AM  Result Value Ref Range   Glucose-Capillary 194  (H) 70 - 99 mg/dL  Blood gas, arterial     Status: Abnormal   Collection Time: 03/16/15  3:54 AM  Result Value Ref Range   FIO2 0.30 %   Delivery systems BILEVEL POSITIVE AIRWAY PRESSURE    Inspiratory PAP 12    Expiratory PAP 5    pH, Arterial 7.269 (L) 7.350 - 7.450   pCO2 arterial 71.4 (HH) 35.0 - 45.0 mmHg    Comment: CRITICAL RESULT CALLED TO, READ BACK BY AND VERIFIED WITH: OLGA MCLEOD,RN BY BFRETWELL RRT,RCP ON 03/16/15 AT 0403    pO2, Arterial 63.7 (L) 80.0 - 100.0 mmHg   Bicarbonate 31.6 (H) 20.0 - 24.0 mEq/L   TCO2 29.5 0 - 100 mmol/L   Acid-Base Excess 5.1 (H) 0.0 - 2.0 mmol/L   O2 Saturation 90.8 %   Patient temperature 37.0    Collection site LEFT RADIAL    Drawn by [id number]    Sample type ARTERIAL DRAW    Allens test (pass/fail) PASS PASS  Glucose, capillary     Status: Abnormal   Collection Time: 03/16/15  4:13 AM  Result Value Ref Range   Glucose-Capillary 160 (H) 70 - 99 mg/dL  Glucose, capillary     Status: Abnormal   Collection Time: 03/16/15  8:13 AM  Result Value Ref Range   Glucose-Capillary 143 (H)  70 - 99 mg/dL   Comment 1 Notify RN    Comment 2 Document in Chart   Glucose, capillary     Status: Abnormal   Collection Time: 03/16/15 11:55 AM  Result Value Ref Range   Glucose-Capillary 169 (H) 70 - 99 mg/dL   Comment 1 Notify RN    Comment 2 Document in Chart   Glucose, capillary     Status: Abnormal   Collection Time: 03/16/15  5:04 PM  Result Value Ref Range   Glucose-Capillary 187 (H) 70 - 99 mg/dL   Comment 1 Notify RN    Comment 2 Document in Chart   Glucose, capillary     Status: Abnormal   Collection Time: 03/16/15  9:13 PM  Result Value Ref Range   Glucose-Capillary 199 (H) 70 - 99 mg/dL    ABGS  Recent Labs  03/16/15 0354  PHART 7.269*  PO2ART 63.7*  TCO2 29.5  HCO3 31.6*   CULTURES Recent Results (from the past 240 hour(s))  Urine culture     Status: None   Collection Time: 03/14/15  8:15 AM  Result Value Ref Range Status    Specimen Description URINE, CATHETERIZED  Final   Special Requests Normal  Final   Colony Count NO GROWTH Performed at [facility]   Final   Culture NO GROWTH Performed at [facility]   Final   Report Status 03/15/2015 FINAL  Final  MRSA PCR Screening     Status: None   Collection Time: 03/14/15 11:04 AM  Result Value Ref Range Status   MRSA by PCR NEGATIVE NEGATIVE Final    Comment:        The GeneXpert MRSA Assay (FDA approved for NASAL specimens only), is one component of a comprehensive MRSA colonization surveillance program. It is not intended to diagnose MRSA infection nor to guide or monitor treatment for MRSA infections.   Culture, blood (x 2)     Status: None (Preliminary result)   Collection Time: 03/14/15 12:00 PM  Result Value Ref Range Status   Specimen Description BLOOD RIGHT HAND  Final   Special Requests BOTTLES DRAWN AEROBIC AND ANAEROBIC 6CC  Final   Culture NO GROWTH 2 DAYS  Final   Report Status PENDING  Incomplete  Culture, blood (routine x 2) Call MD if unable to obtain prior to antibiotics being given     Status: None (Preliminary result)   Collection Time: 03/14/15  2:30 PM  Result Value Ref Range Status   Specimen Description BLOOD PORTA CATH DRAWN BY RN  Final   Special Requests BOTTLES DRAWN AEROBIC AND ANAEROBIC 6CC  Final   Culture NO GROWTH 2 DAYS  Final   Report Status PENDING  Incomplete   Studies/Results: No results found.  Medications:  Prior to Admission:  Prescriptions prior to admission  Medication Sig Dispense Refill Last Dose  . albuterol (PROVENTIL) (2.5 MG/3ML) 0.083% nebulizer solution Take 2.5 mg by nebulization 3 (three) times daily.   03/13/2015 at Unknown time  . alendronate (FOSAMAX) 70 MG tablet Take 70 mg by mouth every Friday. Take with a full glass of water on an empty stomach.   03/12/2015 at Unknown time  . ALPRAZolam (XANAX) 1 MG tablet Take 1 mg by mouth at bedtime.   03/13/2015 at Unknown time  .  aspirin EC 81 MG tablet Take 81 mg by mouth daily.   03/12/2015 at Unknown time  . diltiazem (TIAZAC) 240 MG 24 hr capsule Take 240 mg  by mouth every morning.    03/12/2015 at Unknown time  . ferrous sulfate 325 (65 FE) MG tablet Take 325 mg by mouth daily.   03/12/2015 at Unknown time  . FLUoxetine (PROZAC) 20 MG tablet Take 20 mg by mouth every morning.    03/12/2015 at Unknown time  . fluticasone (FLONASE) 50 MCG/ACT nasal spray Place 2 sprays into the nose daily.   03/13/2015 at Unknown time  . furosemide (LASIX) 40 MG tablet Take 1 tablet (40 mg total) by mouth every morning. As needed for edema 30 tablet 5 03/12/2015 at Unknown time  . HYDROcodone-acetaminophen (NORCO/VICODIN) 5-325 MG per tablet Take 1 tablet by mouth every 6 (six) hours as needed. 15 tablet 0 03/13/2015 at Unknown time  . pantoprazole (PROTONIX) 40 MG tablet Take 1 tablet by mouth 2 (two) times daily.   03/13/2015 at Unknown time  . risperiDONE (RISPERDAL) 0.5 MG tablet Take 0.5 mg by mouth at bedtime.   03/13/2015 at Unknown time  . tiotropium (SPIRIVA) 18 MCG inhalation capsule Place 18 mcg into inhaler and inhale every morning.   03/13/2015 at Unknown time  . predniSONE (DELTASONE) 50 MG tablet One tablet PO daily for 4 days (Patient not taking: Reported on 03/13/2015) 4 tablet 0 Not Taking at Unknown time    Assesment:she was admitted with healthcare associated pneumonia. She is improving. She was septic from that but does not appear to be septic now. She had acute hypercapnic respiratory failure and has been requiring BiPAP at night. She had acute metabolic encephalopathy that is improved.  At baseline she has pretty severe COPD requiring oxygen and nebulizer at home.  She has hypertension which is pretty well controlled.  She has bipolar type II. This is stable.  Active Problems:   Sepsis   Acute renal insufficiency   Leukocytosis   HCAP (healthcare-associated pneumonia)   Acute respiratory failure with hypercapnia    Acute encephalopathy    Plan:continue with current medications and treatments.    LOS: 3 days   Sherena Machorro L 03/17/2015, 7:45 AM

## 2015-03-17 NOTE — Evaluation (Signed)
Physical Therapy Evaluation Patient Details Name: April Pearson MRN: [id number] DOB: 04/10/1941 Today's Date: 03/17/2015   History of Present Illness  HPI: April Pearson is a 74 y.o. female recently discharged from the hospital on 11/13/2015 for healthcare associated pneumonia, she was at the skilled nursing facility. She was recently in the hospital for back pain day prior to admission she was given narcotics and was sent back to her facility. Comes in as she was found down in the floor confused. She was unresponsive at the field she was started on a nonrebreather and given Narcan without any response. As per family members she's had no fever chills, or cough. The patient relates she is struggling to breathe on BiPAP. Family members relate that she was not confused the day prior to admission.  Pt is admitted with pneumonia.  Clinical Impression   Pt was seen for evaluation.  She was alert and oriented, very cooperative.  She is currently on 2 L O2  But has been needing BiPap at night.  She is a resident of [facility]) and normally ambulates with a walker.  She is found to be generally deconditioned on evaluation.  She needs some assist with transfers and was only able to transfer bed to chair using a walker and assist.  I anticipate that she will become ambulatory here in the hospital and should be able to return to [facility] at d/c.  We will plan to see her daily to progress mobility.    Follow Up Recommendations Home health PT    Equipment Recommendations  None recommended by PT    Recommendations for Other Services   none    Precautions / Restrictions Precautions Precautions: Fall Restrictions Weight Bearing Restrictions: No      Mobility  Bed Mobility Overal bed mobility: Needs Assistance Bed Mobility: Supine to Sit     Supine to sit: Min assist;HOB elevated        Transfers Overall transfer level: Needs assistance Equipment used: Rolling walker (2  wheeled) Transfers: Sit to/from [facility] Sit to Stand: Min guard Stand pivot transfers: Min guard          Ambulation/Gait Ambulation/Gait assistance:  (did not attempt gait)              Stairs            Wheelchair Mobility    Modified Rankin (Stroke Patients Only)       Balance Overall balance assessment: No apparent balance deficits (not formally assessed)                                           Pertinent Vitals/Pain Pain Assessment: No/denies pain    Home Living Family/patient expects to be discharged to:: Assisted living               Home Equipment: Walker - 4 wheels;Bedside commode      Prior Function Level of Independence: Needs assistance   Gait / Transfers Assistance Needed: ambulates with a walker, transfers independently  ADL's / Homemaking Assistance Needed: dresses independently but needs assist with bathing...pt is O2 dependent on 2 L O2        Hand Dominance   Dominant Hand: Right    Extremity/Trunk Assessment   Upper Extremity Assessment: Generalized weakness           Lower Extremity Assessment: Generalized  weakness (edemal both LEs)      Cervical / Trunk Assessment: Kyphotic  Communication   Communication: No difficulties  Cognition Arousal/Alertness: Awake/alert Behavior During Therapy: WFL for tasks assessed/performed Overall Cognitive Status: Within Functional Limits for tasks assessed                      General Comments      Exercises General Exercises - Lower Extremity Ankle Circles/Pumps: AROM;Both;5 reps;Supine Short Arc Quad: AROM;Both;10 reps;Supine Heel Slides: AROM;Both;10 reps;Supine Hip ABduction/ADduction: AROM;Both;10 reps;Supine      Assessment/Plan    PT Assessment Patient needs continued PT services  PT Diagnosis Difficulty walking;Generalized weakness   PT Problem List Decreased strength;Decreased activity tolerance;Decreased  mobility;Cardiopulmonary status limiting activity  PT Treatment Interventions Gait training;Functional mobility training;Therapeutic exercise   PT Goals (Current goals can be found in the Care Plan section) Acute Rehab PT Goals Patient Stated Goal: to return to [facility] PT Goal Formulation: With patient Time For Goal Achievement: 03/31/15 Potential to Achieve Goals: Good    Frequency Min 3X/week   Barriers to discharge   none    Co-evaluation               End of Session Equipment Utilized During Treatment: Gait belt Activity Tolerance: Patient tolerated treatment well Patient left: in chair;with call bell/phone within reach Nurse Communication: Mobility status         Time: 1101-1143 PT Time Calculation (min) (ACUTE ONLY): 42 min   Charges:   PT Evaluation $Initial PT Evaluation Tier I: 1 Procedure     PT G CodesDemetrios Isaacs L 03/17/2015, 11:54 AM

## 2015-03-18 ENCOUNTER — Other Ambulatory Visit (HOSPITAL_COMMUNITY): Payer: Self-pay | Admitting: Respiratory Therapy

## 2015-03-18 LAB — GLUCOSE, CAPILLARY
GLUCOSE-CAPILLARY: 172 mg/dL — AB (ref 70–99)
GLUCOSE-CAPILLARY: 147 mg/dL — AB (ref 70–99)
Glucose-Capillary: 150 mg/dL — ABNORMAL HIGH (ref 70–99)
Glucose-Capillary: 182 mg/dL — ABNORMAL HIGH (ref 70–99)

## 2015-03-18 LAB — CBC WITH DIFFERENTIAL/PLATELET
Basophils Absolute: 0 10*3/uL (ref 0.0–0.1)
Basophils Relative: 0 % (ref 0–1)
EOS ABS: 0 10*3/uL (ref 0.0–0.7)
EOS PCT: 0 % (ref 0–5)
HCT: 31 % — ABNORMAL LOW (ref 36.0–46.0)
HEMOGLOBIN: 9.4 g/dL — AB (ref 12.0–15.0)
LYMPHS ABS: 0.2 10*3/uL — AB (ref 0.7–4.0)
Lymphocytes Relative: 2 % — ABNORMAL LOW (ref 12–46)
MCH: 29.7 pg (ref 26.0–34.0)
MCHC: 30.3 g/dL (ref 30.0–36.0)
MCV: 98.1 fL (ref 78.0–100.0)
MONO ABS: 0.3 10*3/uL (ref 0.1–1.0)
Monocytes Relative: 3 % (ref 3–12)
Neutro Abs: 10.7 10*3/uL — ABNORMAL HIGH (ref 1.7–7.7)
Neutrophils Relative %: 95 % — ABNORMAL HIGH (ref 43–77)
PLATELETS: 441 10*3/uL — AB (ref 150–400)
RBC: 3.16 MIL/uL — AB (ref 3.87–5.11)
RDW: 16 % — AB (ref 11.5–15.5)
WBC: 11.3 10*3/uL — ABNORMAL HIGH (ref 4.0–10.5)

## 2015-03-18 NOTE — Progress Notes (Signed)
Subjective: She says she feels better. She still used BiPAP overnight. She may require some sort of positive pressure at home.  Objective: Vital signs in last 24 hours: Temp:  [97 F (36.1 C)-98.7 F (37.1 C)] 98.4 F (36.9 C) (05/05 0400) Pulse Rate:  [63-89] 63 (05/05 0500) Resp:  [14-25] 15 (05/05 0500) BP: (135-159)/(47-89) 149/56 mmHg (05/05 0500) SpO2:  [95 %-100 %] 99 % (05/05 0727) Weight:  [81.2 kg (179 lb 0.2 oz)] 81.2 kg (179 lb 0.2 oz) (05/05 0500) Weight change: 0.4 kg (14.1 oz) Last BM Date: 03/15/15  Intake/Output from previous day: 05/04 0701 - 05/05 0700 In: 290 [P.O.:240; IV Piggyback:50] Out: 850 [Urine:850]  PHYSICAL EXAM General appearance: alert, cooperative and moderate distress Resp: clear to auscultation bilaterally Cardio: regular rate and rhythm, S1, S2 normal, no murmur, click, rub or gallop GI: soft, non-tender; bowel sounds normal; no masses,  no organomegaly Extremities: extremities normal, atraumatic, no cyanosis or edema  Lab Results:  Results for orders placed or performed during the hospital encounter of 03/14/15 (from the past 48 hour(s))  Glucose, capillary     Status: Abnormal   Collection Time: 03/16/15 11:55 AM  Result Value Ref Range   Glucose-Capillary 169 (H) 70 - 99 mg/dL   Comment 1 Notify RN    Comment 2 Document in Chart   Glucose, capillary     Status: Abnormal   Collection Time: 03/16/15  5:04 PM  Result Value Ref Range   Glucose-Capillary 187 (H) 70 - 99 mg/dL   Comment 1 Notify RN    Comment 2 Document in Chart   Glucose, capillary     Status: Abnormal   Collection Time: 03/16/15  9:13 PM  Result Value Ref Range   Glucose-Capillary 199 (H) 70 - 99 mg/dL  Glucose, capillary     Status: Abnormal   Collection Time: 03/17/15  7:27 AM  Result Value Ref Range   Glucose-Capillary 151 (H) 70 - 99 mg/dL   Comment 1 Notify RN   Glucose, capillary     Status: Abnormal   Collection Time: 03/17/15 11:14 AM  Result Value Ref  Range   Glucose-Capillary 155 (H) 70 - 99 mg/dL   Comment 1 Notify RN   Glucose, capillary     Status: Abnormal   Collection Time: 03/17/15  4:31 PM  Result Value Ref Range   Glucose-Capillary 139 (H) 70 - 99 mg/dL   Comment 1 Notify RN    Comment 2 Document in Chart   Glucose, capillary     Status: Abnormal   Collection Time: 03/17/15  9:15 PM  Result Value Ref Range   Glucose-Capillary 192 (H) 70 - 99 mg/dL   Comment 1 Notify RN   Glucose, capillary     Status: Abnormal   Collection Time: 03/18/15  7:25 AM  Result Value Ref Range   Glucose-Capillary 172 (H) 70 - 99 mg/dL   Comment 1 Notify RN     ABGS  Recent Labs  03/16/15 0354  PHART 7.269*  PO2ART 63.7*  TCO2 29.5  HCO3 31.6*   CULTURES Recent Results (from the past 240 hour(s))  Urine culture     Status: None   Collection Time: 03/14/15  8:15 AM  Result Value Ref Range Status   Specimen Description URINE, CATHETERIZED  Final   Special Requests Normal  Final   Colony Count NO GROWTH Performed at [facility]   Final   Culture NO GROWTH Performed at [facility]  Final   Report Status 03/15/2015 FINAL  Final  MRSA PCR Screening     Status: None   Collection Time: 03/14/15 11:04 AM  Result Value Ref Range Status   MRSA by PCR NEGATIVE NEGATIVE Final    Comment:        The GeneXpert MRSA Assay (FDA approved for NASAL specimens only), is one component of a comprehensive MRSA colonization surveillance program. It is not intended to diagnose MRSA infection nor to guide or monitor treatment for MRSA infections.   Culture, blood (x 2)     Status: None (Preliminary result)   Collection Time: 03/14/15 12:00 PM  Result Value Ref Range Status   Specimen Description BLOOD RIGHT HAND  Final   Special Requests BOTTLES DRAWN AEROBIC AND ANAEROBIC 6CC  Final   Culture NO GROWTH 3 DAYS  Final   Report Status PENDING  Incomplete  Culture, blood (routine x 2) Call MD if unable to obtain prior to  antibiotics being given     Status: None (Preliminary result)   Collection Time: 03/14/15  2:30 PM  Result Value Ref Range Status   Specimen Description BLOOD PORTA CATH DRAWN BY RN  Final   Special Requests BOTTLES DRAWN AEROBIC AND ANAEROBIC 6CC  Final   Culture NO GROWTH 3 DAYS  Final   Report Status PENDING  Incomplete   Studies/Results: No results found.  Medications:  Prior to Admission:  Prescriptions prior to admission  Medication Sig Dispense Refill Last Dose  . albuterol (PROVENTIL) (2.5 MG/3ML) 0.083% nebulizer solution Take 2.5 mg by nebulization 3 (three) times daily.   03/13/2015 at Unknown time  . alendronate (FOSAMAX) 70 MG tablet Take 70 mg by mouth every Friday. Take with a full glass of water on an empty stomach.   03/12/2015 at Unknown time  . ALPRAZolam (XANAX) 1 MG tablet Take 1 mg by mouth at bedtime.   03/13/2015 at Unknown time  . aspirin EC 81 MG tablet Take 81 mg by mouth daily.   03/12/2015 at Unknown time  . diltiazem (TIAZAC) 240 MG 24 hr capsule Take 240 mg by mouth every morning.    03/12/2015 at Unknown time  . ferrous sulfate 325 (65 FE) MG tablet Take 325 mg by mouth daily.   03/12/2015 at Unknown time  . FLUoxetine (PROZAC) 20 MG tablet Take 20 mg by mouth every morning.    03/12/2015 at Unknown time  . fluticasone (FLONASE) 50 MCG/ACT nasal spray Place 2 sprays into the nose daily.   03/13/2015 at Unknown time  . furosemide (LASIX) 40 MG tablet Take 1 tablet (40 mg total) by mouth every morning. As needed for edema 30 tablet 5 03/12/2015 at Unknown time  . HYDROcodone-acetaminophen (NORCO/VICODIN) 5-325 MG per tablet Take 1 tablet by mouth every 6 (six) hours as needed. 15 tablet 0 03/13/2015 at Unknown time  . pantoprazole (PROTONIX) 40 MG tablet Take 1 tablet by mouth 2 (two) times daily.   03/13/2015 at Unknown time  . risperiDONE (RISPERDAL) 0.5 MG tablet Take 0.5 mg by mouth at bedtime.   03/13/2015 at Unknown time  . tiotropium (SPIRIVA) 18 MCG inhalation  capsule Place 18 mcg into inhaler and inhale every morning.   03/13/2015 at Unknown time  . predniSONE (DELTASONE) 50 MG tablet One tablet PO daily for 4 days (Patient not taking: Reported on 03/13/2015) 4 tablet 0 Not Taking at Unknown time   Scheduled: . albuterol  2.5 mg Nebulization TID  . ALPRAZolam  1 mg  Oral QHS  . antiseptic oral rinse  7 mL Mouth Rinse q12n4p  . aspirin EC  81 mg Oral Daily  . cefTRIAXone (ROCEPHIN)  IV  1 g Intravenous Q24H  . chlorhexidine  15 mL Mouth Rinse BID  . diltiazem  240 mg Oral q morning - 10a  . FLUoxetine  20 mg Oral q morning - 10a  . heparin  5,000 Units Subcutaneous 3 times per day  . insulin aspart  0-9 Units Subcutaneous TID AC & HS  . methylPREDNISolone (SOLU-MEDROL) injection  60 mg Intravenous Q6H  . pantoprazole  40 mg Oral BID  . risperiDONE  0.5 mg Oral QHS  . sodium chloride  10-40 mL Intracatheter Q12H  . sodium chloride  3 mL Intravenous Q12H  . tiotropium  18 mcg Inhalation q morning - 10a   Continuous:  MLY:[id number] **OR** acetaminophen, ondansetron **OR** ondansetron (ZOFRAN) IV, sodium chloride  Assesment: She was admitted with healthcare associated pneumonia, acute hypercapnic respiratory failure and sepsis. In addition she had acute renal insufficiency but that's better. She was acutely encephalopathic and that's better. She is still requiring BiPAP. Although she is improving she may require BiPAP or noninvasive ventilator because of chronic respiratory failure. This is not sleep apnea. Active Problems:   Sepsis   Acute renal insufficiency   Leukocytosis   HCAP (healthcare-associated pneumonia)   Acute respiratory failure with hypercapnia   Acute encephalopathy    Plan: Continue current treatments she may be able to move out of the ICU later today. I'm going to get a CBC    LOS: 4 days   April Pearson 03/18/2015, 8:14 AM

## 2015-03-18 NOTE — Clinical Social Work Note (Signed)
CSW spoke with Winifred Olive of [facility].  CSW advised that patient may be discharged on Saturday.  CSW advised that patient will have a BiPap upon discharge.  CSW also advised that home health would be ordered for patient. Ms. Celine Ahr indicated that she did not have a preference of home health agencies.   Ihor Gully, [place] [phone]

## 2015-03-18 NOTE — Progress Notes (Signed)
Physical Therapy Treatment Patient Details Name: April Pearson MRN: [id number] DOB: 10/15/1941 Today's Date: 03/18/2015    History of Present Illness HPI: April Pearson is a 74 y.o. female recently discharged from the hospital on 11/13/2015 for healthcare associated pneumonia, she was at the skilled nursing facility. She was recently in the hospital for back pain day prior to admission she was given narcotics and was sent back to her facility. Comes in as she was found down in the floor confused. She was unresponsive at the field she was started on a nonrebreather and given Narcan without any response. As per family members she's had no fever chills, or cough. The patient relates she is struggling to breathe on BiPAP. Family members relate that she was not confused the day prior to admission.    PT Comments    Pt very difficult to arouse upon entrance.  Able to get patient more alert with help from nursing.  Pt required more assist with sit to stand than previous visit maybe due to increased lethargy.  Worked with patient a lot on breathing techniques to increase lung capacity and oxygenation.  Pt require max VC's during treatment with gait, breathing and exercises to continue and complete correctly.  PT was extremely fatigued following ambulation taking 5 minutes to recover prior to beginning therex.       Equipment Recommendations  None recommended by PT       Precautions / Restrictions Precautions Precautions: Fall Restrictions Weight Bearing Restrictions: No    Mobility  Bed Mobility                  Transfers Overall transfer level: Needs assistance Equipment used: Rolling walker (2 wheeled) Transfers: Sit to/from Stand Sit to Stand: Mod assist            Ambulation/Gait Ambulation/Gait assistance: Mod assist Ambulation Distance (Feet): 25 Feet Assistive device: Rolling walker (2 wheeled) Gait Pattern/deviations: Step-to pattern;Trunk flexed Gait velocity:  slow, labored   General Gait Details: Pt lethargic, forward bent posturing, required assistance for direction and walker navigation.    Stairs            Wheelchair Mobility    Modified Rankin (Stroke Patients Only)       Balance                                    Cognition Arousal/Alertness: Lethargic Behavior During Therapy: WFL for tasks assessed/performed Overall Cognitive Status: Within Functional Limits for tasks assessed                      Exercises General Exercises - Lower Extremity Long Arc Quad: Both;AROM;10 reps;Seated Hip Flexion/Marching: AROM;Both;10 reps;Seated        Pertinent Vitals/Pain Pain Assessment: No/denies pain    Home Living                      Prior Function            PT Goals (current goals can now be found in the care plan section)      Frequency       PT Plan Current plan remains appropriate    Co-evaluation             End of Session Equipment Utilized During Treatment: Gait belt Activity Tolerance: Patient limited by fatigue Patient left: in chair;with call bell/phone within  reach     Time: 1120-1155 PT Time Calculation (min) (ACUTE ONLY): 35 min  Charges:  $Gait Training: 8-22 mins $Therapeutic Exercise: 8-22 mins                    G CodesTeena Irani, PTA/CLT [phone] 03/18/2015, 12:15 PM

## 2015-03-18 NOTE — Care Management Note (Signed)
Case Management Note  Patient Details  Name: April Pearson MRN: [id number] Date of Birth: 1940-12-13   Expected Discharge Date:  03/20/2015               Expected Discharge Plan:  Assisted Living / Rest Home  In-House Referral:  Clinical Social Work  Discharge planning Services  CM Consult  Post Acute Care Choice:  Durable Medical Equipment Choice offered to:  Patient  DME Arranged:  Bipap DME Agency:  [place] Arranged:    [place] Agency:     Status of Service:  In process, will continue to follow  Medicare Important Message Given:    Date Medicare IM Given:    Medicare IM give by:    Date Additional Medicare IM Given:    Additional Medicare Important Message give by:     If discussed at [place] of Stay Meetings, dates discussed:    Additional Comments: Per MD pt will need Bipap at DC. Pt's home O2 is through [facility] pt would like Bipap through [facility] also. Terrence Dupont, of [facility] notified of DME needs and will obtain pt info from chart. Pt's initial consult recommends [facility]. PT will cont to assess today and tomorrow.  Sherald Barge, RN 03/18/2015, 11:42 AM

## 2015-03-19 ENCOUNTER — Inpatient Hospital Stay (HOSPITAL_COMMUNITY): Payer: Medicare Other

## 2015-03-19 LAB — GLUCOSE, CAPILLARY
GLUCOSE-CAPILLARY: 155 mg/dL — AB (ref 70–99)
GLUCOSE-CAPILLARY: 239 mg/dL — AB (ref 70–99)
Glucose-Capillary: 179 mg/dL — ABNORMAL HIGH (ref 70–99)
Glucose-Capillary: 173 mg/dL — ABNORMAL HIGH (ref 70–99)

## 2015-03-19 LAB — BLOOD GAS, ARTERIAL
Acid-Base Excess: 6.7 mmol/L — ABNORMAL HIGH (ref 0.0–2.0)
Bicarbonate: 33.5 mEq/L — ABNORMAL HIGH (ref 20.0–24.0)
Drawn by: 25788
O2 CONTENT: 3 L/min
O2 SAT: 94.2 %
PATIENT TEMPERATURE: 37
PO2 ART: 76.1 mmHg — AB (ref 80.0–100.0)
TCO2: 32.1 mmol/L (ref 0–100)
pCO2 arterial: 78 mmHg (ref 35.0–45.0)
pH, Arterial: 7.257 — ABNORMAL LOW (ref 7.350–7.450)

## 2015-03-19 LAB — CULTURE, BLOOD (ROUTINE X 2)
Culture: NO GROWTH
Culture: NO GROWTH

## 2015-03-19 MED ORDER — ALTEPLASE 2 MG IJ SOLR
6.0000 mg | Freq: Once | INTRAMUSCULAR | Status: AC | PRN
  Administered 2015-03-19: 6 mg
  Filled 2015-03-19: qty 6

## 2015-03-19 MED ORDER — FUROSEMIDE 10 MG/ML IJ SOLN
40.0000 mg | Freq: Once | INTRAMUSCULAR | Status: AC
  Administered 2015-03-19: 40 mg via INTRAVENOUS
  Filled 2015-03-19: qty 4

## 2015-03-19 MED ORDER — SODIUM CHLORIDE 0.9 % IJ SOLN: 10.0000 mL | INTRAMUSCULAR | Status: DC | PRN

## 2015-03-19 MED ORDER — LORAZEPAM 2 MG/ML IJ SOLN
1.0000 mg | INTRAMUSCULAR | Status: DC | PRN
  Administered 2015-03-19 – 2015-03-20 (×2): 1 mg via INTRAVENOUS
  Filled 2015-03-19: qty 1

## 2015-03-19 MED ORDER — SODIUM CHLORIDE 0.9 % IJ SOLN
10.0000 mL | Freq: Two times a day (BID) | INTRAMUSCULAR | Status: DC
  Administered 2015-03-19 – 2015-03-21 (×4): 10 mL

## 2015-03-19 MED ORDER — LORAZEPAM 2 MG/ML IJ SOLN
INTRAMUSCULAR | Status: AC
  Filled 2015-03-19: qty 1

## 2015-03-19 MED FILL — Alteplase For Inj 2 MG: INTRAMUSCULAR | Qty: 6 | Status: AC

## 2015-03-19 NOTE — Plan of Care (Signed)
Problem: Phase III Progression Outcomes Goal: Voiding independently Outcome: Not Progressing PT OFF BIPAP LAST NIGHT. DIFFICULT TO AROUSE THIS AM. ABG PREFORMED AND PT PLACED BACK ON BIPAP. Goal: IV/normal saline lock discontinued Outcome: Progressing CONTINUES TO REQUIRE IV ROCEPHIN.AND IV MED

## 2015-03-19 NOTE — Care Management Note (Signed)
Case Management Note  Patient Details  Name: KEREN ALVERIO MRN: [id number] Date of Birth: [birth year]   Expected Discharge Date:  03/18/15               Expected Discharge Plan:  Assisted Living / Rest Home  In-House Referral:  Clinical Social Work  Discharge planning Services  CM Consult  Post Acute Care Choice:  Durable Medical Equipment Choice offered to:  Patient  DME Arranged:  Bipap DME Agency:  [place] Arranged:  [facility] [place] Agency:  [place]  Status of Service:  Completed, signed off  Medicare Important Message Given:  Yes Date Medicare IM Given:  03/19/15 Medicare IM give by:  Jolene Provost, RN, MSN, CM Date Additional Medicare IM Given:    Additional Medicare Important Message give by:     If discussed at [place] of Stay Meetings, dates discussed:    Additional Comments: Anticipate pt discharge over weekend. Pt's BiPAP has been ordered and the RN will notify [place] over the weekend when pt discharges. No further CM needs.  Sherald Barge, RN 03/19/2015, 10:47 AM

## 2015-03-19 NOTE — Progress Notes (Signed)
Subjective: She is significantly more lethargic today. I did not have her use BiPAP last night to see how she did without it. She is arousable but then goes right back to sleep. She also has more edema.  Objective: Vital signs in last 24 hours: Temp:  [97.5 F (36.4 C)-98.6 F (37 C)] 98.1 F (36.7 C) (05/06 0400) Pulse Rate:  [59-79] 66 (05/06 0500) Resp:  [16-26] 21 (05/06 0500) BP: (126-164)/(49-122) 149/63 mmHg (05/06 0500) SpO2:  [93 %-100 %] 96 % (05/06 0647) Weight:  [81.8 kg (180 lb 5.4 oz)] 81.8 kg (180 lb 5.4 oz) (05/06 0500) Weight change: 0.6 kg (1 lb 5.2 oz) Last BM Date: 03/15/15  Intake/Output from previous day: 05/05 0701 - 05/06 0700 In: 170 [P.O.:120; IV Piggyback:50] Out: 700 [Urine:700]  PHYSICAL EXAM She is less responsive. She does arouse but then goes back to sleep. Her chest shows rhonchi bilaterally. Her heart is regular. She has more edema of the upper and lower extremities.  Lab Results:  Results for orders placed or performed during the hospital encounter of 03/14/15 (from the past 48 hour(s))  Glucose, capillary     Status: Abnormal   Collection Time: 03/17/15 11:14 AM  Result Value Ref Range   Glucose-Capillary 155 (H) 70 - 99 mg/dL   Comment 1 Notify RN   Glucose, capillary     Status: Abnormal   Collection Time: 03/17/15  4:31 PM  Result Value Ref Range   Glucose-Capillary 139 (H) 70 - 99 mg/dL   Comment 1 Notify RN    Comment 2 Document in Chart   Glucose, capillary     Status: Abnormal   Collection Time: 03/17/15  9:15 PM  Result Value Ref Range   Glucose-Capillary 192 (H) 70 - 99 mg/dL   Comment 1 Notify RN   Glucose, capillary     Status: Abnormal   Collection Time: 03/18/15  7:25 AM  Result Value Ref Range   Glucose-Capillary 172 (H) 70 - 99 mg/dL   Comment 1 Notify RN   CBC with Differential/Platelet     Status: Abnormal   Collection Time: 03/18/15 10:02 AM  Result Value Ref Range   WBC 11.3 (H) 4.0 - 10.5 K/uL   RBC 3.16 (L)  3.87 - 5.11 MIL/uL   Hemoglobin 9.4 (L) 12.0 - 15.0 g/dL   HCT 31.0 (L) 36.0 - 46.0 %   MCV 98.1 78.0 - 100.0 fL   MCH 29.7 26.0 - 34.0 pg   MCHC 30.3 30.0 - 36.0 g/dL   RDW 16.0 (H) 11.5 - 15.5 %   Platelets 441 (H) 150 - 400 K/uL   Neutrophils Relative % 95 (H) 43 - 77 %   Neutro Abs 10.7 (H) 1.7 - 7.7 K/uL   Lymphocytes Relative 2 (L) 12 - 46 %   Lymphs Abs 0.2 (L) 0.7 - 4.0 K/uL   Monocytes Relative 3 3 - 12 %   Monocytes Absolute 0.3 0.1 - 1.0 K/uL   Eosinophils Relative 0 0 - 5 %   Eosinophils Absolute 0.0 0.0 - 0.7 K/uL   Basophils Relative 0 0 - 1 %   Basophils Absolute 0.0 0.0 - 0.1 K/uL  Glucose, capillary     Status: Abnormal   Collection Time: 03/18/15 11:08 AM  Result Value Ref Range   Glucose-Capillary 150 (H) 70 - 99 mg/dL   Comment 1 Notify RN   Glucose, capillary     Status: Abnormal   Collection Time: 03/18/15  4:14 PM  Result Value Ref Range   Glucose-Capillary 147 (H) 70 - 99 mg/dL   Comment 1 Notify RN   Glucose, capillary     Status: Abnormal   Collection Time: 03/18/15  9:06 PM  Result Value Ref Range   Glucose-Capillary 182 (H) 70 - 99 mg/dL   Comment 1 Notify RN     ABGS No results for input(s): PHART, PO2ART, TCO2, HCO3 in the last 72 hours.  Invalid input(s): PCO2 CULTURES Recent Results (from the past 240 hour(s))  Urine culture     Status: None   Collection Time: 03/14/15  8:15 AM  Result Value Ref Range Status   Specimen Description URINE, CATHETERIZED  Final   Special Requests Normal  Final   Colony Count NO GROWTH Performed at [facility]   Final   Culture NO GROWTH Performed at [facility]   Final   Report Status 03/15/2015 FINAL  Final  MRSA PCR Screening     Status: None   Collection Time: 03/14/15 11:04 AM  Result Value Ref Range Status   MRSA by PCR NEGATIVE NEGATIVE Final    Comment:        The GeneXpert MRSA Assay (FDA approved for NASAL specimens only), is one component of a comprehensive MRSA  colonization surveillance program. It is not intended to diagnose MRSA infection nor to guide or monitor treatment for MRSA infections.   Culture, blood (x 2)     Status: None   Collection Time: 03/14/15 12:00 PM  Result Value Ref Range Status   Specimen Description BLOOD RIGHT HAND  Final   Special Requests BOTTLES DRAWN AEROBIC AND ANAEROBIC 6CC  Final   Culture NO GROWTH 5 DAYS  Final   Report Status 03/19/2015 FINAL  Final  Culture, blood (routine x 2) Call MD if unable to obtain prior to antibiotics being given     Status: None   Collection Time: 03/14/15  2:30 PM  Result Value Ref Range Status   Specimen Description BLOOD PORTA CATH DRAWN BY RN  Final   Special Requests BOTTLES DRAWN AEROBIC AND ANAEROBIC 6CC  Final   Culture NO GROWTH 5 DAYS  Final   Report Status 03/19/2015 FINAL  Final   Studies/Results: No results found.  Medications:  Prior to Admission:  Prescriptions prior to admission  Medication Sig Dispense Refill Last Dose  . albuterol (PROVENTIL) (2.5 MG/3ML) 0.083% nebulizer solution Take 2.5 mg by nebulization 3 (three) times daily.   03/13/2015 at Unknown time  . alendronate (FOSAMAX) 70 MG tablet Take 70 mg by mouth every Friday. Take with a full glass of water on an empty stomach.   03/12/2015 at Unknown time  . ALPRAZolam (XANAX) 1 MG tablet Take 1 mg by mouth at bedtime.   03/13/2015 at Unknown time  . aspirin EC 81 MG tablet Take 81 mg by mouth daily.   03/12/2015 at Unknown time  . diltiazem (TIAZAC) 240 MG 24 hr capsule Take 240 mg by mouth every morning.    03/12/2015 at Unknown time  . ferrous sulfate 325 (65 FE) MG tablet Take 325 mg by mouth daily.   03/12/2015 at Unknown time  . FLUoxetine (PROZAC) 20 MG tablet Take 20 mg by mouth every morning.    03/12/2015 at Unknown time  . fluticasone (FLONASE) 50 MCG/ACT nasal spray Place 2 sprays into the nose daily.   03/13/2015 at Unknown time  . furosemide (LASIX) 40 MG tablet Take 1 tablet (40 mg total) by  mouth every morning. As needed for edema 30 tablet 5 03/12/2015 at Unknown time  . HYDROcodone-acetaminophen (NORCO/VICODIN) 5-325 MG per tablet Take 1 tablet by mouth every 6 (six) hours as needed. 15 tablet 0 03/13/2015 at Unknown time  . pantoprazole (PROTONIX) 40 MG tablet Take 1 tablet by mouth 2 (two) times daily.   03/13/2015 at Unknown time  . risperiDONE (RISPERDAL) 0.5 MG tablet Take 0.5 mg by mouth at bedtime.   03/13/2015 at Unknown time  . tiotropium (SPIRIVA) 18 MCG inhalation capsule Place 18 mcg into inhaler and inhale every morning.   03/13/2015 at Unknown time  . predniSONE (DELTASONE) 50 MG tablet One tablet PO daily for 4 days (Patient not taking: Reported on 03/13/2015) 4 tablet 0 Not Taking at Unknown time   Scheduled: . albuterol  2.5 mg Nebulization TID  . ALPRAZolam  1 mg Oral QHS  . antiseptic oral rinse  7 mL Mouth Rinse q12n4p  . aspirin EC  81 mg Oral Daily  . cefTRIAXone (ROCEPHIN)  IV  1 g Intravenous Q24H  . chlorhexidine  15 mL Mouth Rinse BID  . diltiazem  240 mg Oral q morning - 10a  . FLUoxetine  20 mg Oral q morning - 10a  . furosemide  40 mg Intravenous Once  . heparin  5,000 Units Subcutaneous 3 times per day  . insulin aspart  0-9 Units Subcutaneous TID AC & HS  . methylPREDNISolone (SOLU-MEDROL) injection  60 mg Intravenous Q6H  . pantoprazole  40 mg Oral BID  . risperiDONE  0.5 mg Oral QHS  . sodium chloride  10-40 mL Intracatheter Q12H  . sodium chloride  3 mL Intravenous Q12H  . tiotropium  18 mcg Inhalation q morning - 10a   Continuous:  UEK:[id number] **OR** acetaminophen, ondansetron **OR** ondansetron (ZOFRAN) IV, sodium chloride  Assesment: She was admitted with healthcare associated pneumonia and sepsis. She had acute respiratory failure with hypercapnia and was acutely encephalopathic. She had been improving. She had been using BiPAP at night. She did not use BiPAP last night with a sense of how she would do without it. She is more  lethargic this morning. Active Problems:   Sepsis   Acute renal insufficiency   Leukocytosis   HCAP (healthcare-associated pneumonia)   Acute respiratory failure with hypercapnia   Acute encephalopathy    Plan: Check arterial blood gas. She is going to get some Lasix today. It appears that she will require BiPAP which had been my impression previously but it appears certain now    LOS: 5 days   Ahuva Poynor L 03/19/2015, 7:27 AM

## 2015-03-19 NOTE — Plan of Care (Signed)
Problem: Phase III Progression Outcomes Goal: Foley discontinued Outcome: Not Progressing FOLEY CATH REINSERTED D/T PT GOING BACK ON IV LASIX AND BIPAP

## 2015-03-19 NOTE — Progress Notes (Signed)
PT HAS BECOME VERY AGITATED,PARANOID, AND CONFUSED. SHE THINKS THE STAFF IS TRYING TO HARM HER DESPITE NUMERUS ATTEMPTS TO CONSOLE HER.

## 2015-03-19 NOTE — Progress Notes (Signed)
After 2( total of 12 2 to each port twice) flushes of activase still unable to draw blood from picc. Chest xray done to check for occlusion in arm. Note kink in cathter. Withdrew PICC 1 and a 1/2 cm able to flush picc and withdraw blood.Marland Kitchen

## 2015-03-20 LAB — BASIC METABOLIC PANEL
Anion gap: 7 (ref 5–15)
BUN: 44 mg/dL — AB (ref 6–20)
CO2: 37 mmol/L — AB (ref 22–32)
Calcium: 8.9 mg/dL (ref 8.9–10.3)
Chloride: 99 mmol/L — ABNORMAL LOW (ref 101–111)
Creatinine, Ser: 1 mg/dL (ref 0.44–1.00)
GFR calc Af Amer: >60 mL/min (ref >60)
GFR calc non Af Amer: 55 mL/min — ABNORMAL LOW (ref >60)
Glucose, Bld: 153 mg/dL — ABNORMAL HIGH (ref 70–99)
POTASSIUM: 4.7 mmol/L (ref 3.5–5.1)
SODIUM: 143 mmol/L (ref 135–145)

## 2015-03-20 LAB — GLUCOSE, CAPILLARY
GLUCOSE-CAPILLARY: 148 mg/dL — AB (ref 70–99)
GLUCOSE-CAPILLARY: 139 mg/dL — AB (ref 70–99)
Glucose-Capillary: 160 mg/dL — ABNORMAL HIGH (ref 70–99)
Glucose-Capillary: 152 mg/dL — ABNORMAL HIGH (ref 70–99)

## 2015-03-20 MED ORDER — PREDNISONE 20 MG PO TABS
40.0000 mg | ORAL_TABLET | Freq: Every day | ORAL | Status: DC
  Administered 2015-03-20 – 2015-03-22 (×3): 40 mg via ORAL
  Filled 2015-03-20 (×3): qty 2

## 2015-03-20 NOTE — Progress Notes (Signed)
PT TRANSFERRING TO ROOM 314 AS A MED/SURG PT. PT IS ALERT,BUT CONFUSED AT TIME. OOB W/ ASSIST X1 TO BSC. NSR.O2 AT 2L/MIN VIA [place]. O2 SAT 100%. VOIDING WELL SINCE FOLEY D/C'D. TRANSFER REPORT CALLED TO CRYSTAL RN ON 300.

## 2015-03-20 NOTE — Progress Notes (Signed)
Subjective: She feels much better. She had problems with being confused last night.  Objective: Vital signs in last 24 hours: Temp:  [97.3 F (36.3 C)-98.3 F (36.8 C)] 98.2 F (36.8 C) (05/07 0754) Pulse Rate:  [57-84] 81 (05/07 0800) Resp:  [16-32] 32 (05/07 0800) BP: (107-171)/(57-97) 171/74 mmHg (05/07 0800) SpO2:  [91 %-100 %] 94 % (05/07 0807) FiO2 (%):  [2 %-30 %] 30 % (05/07 0400) Weight:  [79.8 kg (175 lb 14.8 oz)] 79.8 kg (175 lb 14.8 oz) (05/07 0400) Weight change: -2 kg (-4 lb 6.6 oz) Last BM Date: 03/16/15  Intake/Output from previous day: 05/06 0701 - 05/07 0700 In: 790 [P.O.:720; I.V.:20; IV Piggyback:50] Out: 2400 [Urine:2400]  PHYSICAL EXAM General appearance: alert, cooperative and no distress Resp: clear to auscultation bilaterally Cardio: regular rate and rhythm, S1, S2 normal, no murmur, click, rub or gallop GI: soft, non-tender; bowel sounds normal; no masses,  no organomegaly Extremities: extremities normal, atraumatic, no cyanosis or edema  Lab Results:  Results for orders placed or performed during the hospital encounter of 03/14/15 (from the past 48 hour(s))  CBC with Differential/Platelet     Status: Abnormal   Collection Time: 03/18/15 10:02 AM  Result Value Ref Range   WBC 11.3 (H) 4.0 - 10.5 K/uL   RBC 3.16 (L) 3.87 - 5.11 MIL/uL   Hemoglobin 9.4 (L) 12.0 - 15.0 g/dL   HCT 31.0 (L) 36.0 - 46.0 %   MCV 98.1 78.0 - 100.0 fL   MCH 29.7 26.0 - 34.0 pg   MCHC 30.3 30.0 - 36.0 g/dL   RDW 16.0 (H) 11.5 - 15.5 %   Platelets 441 (H) 150 - 400 K/uL   Neutrophils Relative % 95 (H) 43 - 77 %   Neutro Abs 10.7 (H) 1.7 - 7.7 K/uL   Lymphocytes Relative 2 (L) 12 - 46 %   Lymphs Abs 0.2 (L) 0.7 - 4.0 K/uL   Monocytes Relative 3 3 - 12 %   Monocytes Absolute 0.3 0.1 - 1.0 K/uL   Eosinophils Relative 0 0 - 5 %   Eosinophils Absolute 0.0 0.0 - 0.7 K/uL   Basophils Relative 0 0 - 1 %   Basophils Absolute 0.0 0.0 - 0.1 K/uL  Glucose, capillary      Status: Abnormal   Collection Time: 03/18/15 11:08 AM  Result Value Ref Range   Glucose-Capillary 150 (H) 70 - 99 mg/dL   Comment 1 Notify RN   Glucose, capillary     Status: Abnormal   Collection Time: 03/18/15  4:14 PM  Result Value Ref Range   Glucose-Capillary 147 (H) 70 - 99 mg/dL   Comment 1 Notify RN   Glucose, capillary     Status: Abnormal   Collection Time: 03/18/15  9:06 PM  Result Value Ref Range   Glucose-Capillary 182 (H) 70 - 99 mg/dL   Comment 1 Notify RN   Glucose, capillary     Status: Abnormal   Collection Time: 03/19/15  7:24 AM  Result Value Ref Range   Glucose-Capillary 155 (H) 70 - 99 mg/dL  Blood gas, arterial     Status: Abnormal   Collection Time: 03/19/15  7:55 AM  Result Value Ref Range   O2 Content 3.0 L/min   pH, Arterial 7.257 (L) 7.350 - 7.450   pCO2 arterial 78.0 (HH) 35.0 - 45.0 mmHg    Comment: CRITICAL RESULT CALLED TO, READ BACK BY AND VERIFIED WITH: CINDY PHILLIPS RN BY ROBIN POWELLRRT AT [phone]  ON 03/19/15    pO2, Arterial 76.1 (L) 80.0 - 100.0 mmHg   Bicarbonate 33.5 (H) 20.0 - 24.0 mEq/L   TCO2 32.1 0 - 100 mmol/L   Acid-Base Excess 6.7 (H) 0.0 - 2.0 mmol/L   O2 Saturation 94.2 %   Patient temperature 37.0    Collection site RIGHT RADIAL    Drawn by [id number]    Sample type ARTERIAL    Allens test (pass/fail) PASS PASS  Glucose, capillary     Status: Abnormal   Collection Time: 03/19/15 11:04 AM  Result Value Ref Range   Glucose-Capillary 179 (H) 70 - 99 mg/dL   Comment 1 Notify RN    Comment 2 Document in Chart   Glucose, capillary     Status: Abnormal   Collection Time: 03/19/15  3:28 PM  Result Value Ref Range   Glucose-Capillary 239 (H) 70 - 99 mg/dL  Glucose, capillary     Status: Abnormal   Collection Time: 03/19/15  8:23 PM  Result Value Ref Range   Glucose-Capillary 173 (H) 70 - 99 mg/dL   Comment 1 Notify RN   Basic metabolic panel     Status: Abnormal   Collection Time: 03/20/15  5:03 AM  Result Value Ref Range    Sodium 143 135 - 145 mmol/L   Potassium 4.7 3.5 - 5.1 mmol/L   Chloride 99 (L) 101 - 111 mmol/L   CO2 37 (H) 22 - 32 mmol/L   Glucose, Bld 153 (H) 70 - 99 mg/dL   BUN 44 (H) 6 - 20 mg/dL   Creatinine, Ser 1.00 0.44 - 1.00 mg/dL   Calcium 8.9 8.9 - 10.3 mg/dL   GFR calc non Af Amer 55 (L) >60 mL/min   GFR calc Af Amer >60 >60 mL/min    Comment: (NOTE) The eGFR has been calculated using the CKD EPI equation. This calculation has not been validated in all clinical situations. eGFR's persistently <60 mL/min signify possible Chronic Kidney Disease.    Anion gap 7 5 - 15  Glucose, capillary     Status: Abnormal   Collection Time: 03/20/15  7:26 AM  Result Value Ref Range   Glucose-Capillary 148 (H) 70 - 99 mg/dL   Comment 1 Notify RN    Comment 2 Document in Chart     ABGS  Recent Labs  03/19/15 0755  PHART 7.257*  PO2ART 76.1*  TCO2 32.1  HCO3 33.5*   CULTURES Recent Results (from the past 240 hour(s))  Urine culture     Status: None   Collection Time: 03/14/15  8:15 AM  Result Value Ref Range Status   Specimen Description URINE, CATHETERIZED  Final   Special Requests Normal  Final   Colony Count NO GROWTH Performed at [facility]   Final   Culture NO GROWTH Performed at [facility]   Final   Report Status 03/15/2015 FINAL  Final  MRSA PCR Screening     Status: None   Collection Time: 03/14/15 11:04 AM  Result Value Ref Range Status   MRSA by PCR NEGATIVE NEGATIVE Final    Comment:        The GeneXpert MRSA Assay (FDA approved for NASAL specimens only), is one component of a comprehensive MRSA colonization surveillance program. It is not intended to diagnose MRSA infection nor to guide or monitor treatment for MRSA infections.   Culture, blood (x 2)     Status: None   Collection Time: 03/14/15 12:00 PM  Result  Value Ref Range Status   Specimen Description BLOOD RIGHT HAND  Final   Special Requests BOTTLES DRAWN AEROBIC AND ANAEROBIC 6CC   Final   Culture NO GROWTH 5 DAYS  Final   Report Status 03/19/2015 FINAL  Final  Culture, blood (routine x 2) Call MD if unable to obtain prior to antibiotics being given     Status: None   Collection Time: 03/14/15  2:30 PM  Result Value Ref Range Status   Specimen Description BLOOD PORTA CATH DRAWN BY RN  Final   Special Requests BOTTLES DRAWN AEROBIC AND ANAEROBIC 6CC  Final   Culture NO GROWTH 5 DAYS  Final   Report Status 03/19/2015 FINAL  Final   Studies/Results: Dg Humerus Right  03/19/2015   CLINICAL DATA:  Evaluate RIGHT upper extremity PICC. Dysfunctional line.  EXAM: RIGHT HUMERUS - 2+ VIEW  COMPARISON:  None.  FINDINGS: There is a kink in the PICC close to the hub. Distal into the PICC is in the upper superior vena cava. This is difficult to visualize due to overlying bones. This was not present on the most recent comparison exam 03/14/2015. Visible RIGHT chest appears within normal limits. Aortic arch atherosclerosis. Cardiopericardial silhouette partially visible.  IMPRESSION: Kink in the PICC near the hub, either external to the patient or in the superficial subcutaneous tissues.   Electronically Signed   By: Dereck Ligas M.D.   On: 03/19/2015 16:57    Medications:  Prior to Admission:  Prescriptions prior to admission  Medication Sig Dispense Refill Last Dose  . albuterol (PROVENTIL) (2.5 MG/3ML) 0.083% nebulizer solution Take 2.5 mg by nebulization 3 (three) times daily.   03/13/2015 at Unknown time  . alendronate (FOSAMAX) 70 MG tablet Take 70 mg by mouth every Friday. Take with a full glass of water on an empty stomach.   03/12/2015 at Unknown time  . ALPRAZolam (XANAX) 1 MG tablet Take 1 mg by mouth at bedtime.   03/13/2015 at Unknown time  . aspirin EC 81 MG tablet Take 81 mg by mouth daily.   03/12/2015 at Unknown time  . diltiazem (TIAZAC) 240 MG 24 hr capsule Take 240 mg by mouth every morning.    03/12/2015 at Unknown time  . ferrous sulfate 325 (65 FE) MG tablet Take  325 mg by mouth daily.   03/12/2015 at Unknown time  . FLUoxetine (PROZAC) 20 MG tablet Take 20 mg by mouth every morning.    03/12/2015 at Unknown time  . fluticasone (FLONASE) 50 MCG/ACT nasal spray Place 2 sprays into the nose daily.   03/13/2015 at Unknown time  . furosemide (LASIX) 40 MG tablet Take 1 tablet (40 mg total) by mouth every morning. As needed for edema 30 tablet 5 03/12/2015 at Unknown time  . HYDROcodone-acetaminophen (NORCO/VICODIN) 5-325 MG per tablet Take 1 tablet by mouth every 6 (six) hours as needed. 15 tablet 0 03/13/2015 at Unknown time  . pantoprazole (PROTONIX) 40 MG tablet Take 1 tablet by mouth 2 (two) times daily.   03/13/2015 at Unknown time  . risperiDONE (RISPERDAL) 0.5 MG tablet Take 0.5 mg by mouth at bedtime.   03/13/2015 at Unknown time  . tiotropium (SPIRIVA) 18 MCG inhalation capsule Place 18 mcg into inhaler and inhale every morning.   03/13/2015 at Unknown time  . predniSONE (DELTASONE) 50 MG tablet One tablet PO daily for 4 days (Patient not taking: Reported on 03/13/2015) 4 tablet 0 Not Taking at Unknown time   Scheduled: . albuterol  2.5 mg Nebulization TID  . ALPRAZolam  1 mg Oral QHS  . antiseptic oral rinse  7 mL Mouth Rinse q12n4p  . aspirin EC  81 mg Oral Daily  . cefTRIAXone (ROCEPHIN)  IV  1 g Intravenous Q24H  . chlorhexidine  15 mL Mouth Rinse BID  . diltiazem  240 mg Oral q morning - 10a  . FLUoxetine  20 mg Oral q morning - 10a  . heparin  5,000 Units Subcutaneous 3 times per day  . insulin aspart  0-9 Units Subcutaneous TID AC & HS  . methylPREDNISolone (SOLU-MEDROL) injection  60 mg Intravenous Q6H  . pantoprazole  40 mg Oral BID  . risperiDONE  0.5 mg Oral QHS  . sodium chloride  10-40 mL Intracatheter Q12H  . sodium chloride  10-40 mL Intracatheter Q12H  . tiotropium  18 mcg Inhalation q morning - 10a   Continuous:  OZH:[id number] **OR** acetaminophen, LORazepam, ondansetron **OR** ondansetron (ZOFRAN) IV, sodium chloride, sodium  chloride  Assesment: She is admitted with healthcare associated pneumonia and acute respiratory failure with hypercapnia. She is much improved. She was septic. She's had some confusion. This may be related to encephalopathy from her respiratory failure may be some element of ICU psychosis. Active Problems:   Sepsis   Acute renal insufficiency   Leukocytosis   HCAP (healthcare-associated pneumonia)   Acute respiratory failure with hypercapnia   Acute encephalopathy    Plan: Transfer from ICU to switch to oral meds probably back to the assisted living facility tomorrow    LOS: 6 days   Edana Aguado L 03/20/2015, 8:38 AM

## 2015-03-21 LAB — GLUCOSE, CAPILLARY
Glucose-Capillary: 136 mg/dL — ABNORMAL HIGH (ref 70–99)
Glucose-Capillary: 161 mg/dL — ABNORMAL HIGH (ref 70–99)
Glucose-Capillary: 137 mg/dL — ABNORMAL HIGH (ref 70–99)
Glucose-Capillary: 131 mg/dL — ABNORMAL HIGH (ref 70–99)

## 2015-03-21 MED ORDER — PREDNISONE 10 MG PO TABS: 10.0000 mg | ORAL_TABLET | Freq: Every day | ORAL | Status: DC

## 2015-03-21 MED ORDER — CEPHALEXIN 500 MG PO CAPS: 500.0000 mg | ORAL_CAPSULE | Freq: Three times a day (TID) | ORAL | Status: DC

## 2015-03-21 NOTE — Discharge Summary (Signed)
Physician Discharge Summary  Patient ID: April Pearson MRN: [id number] DOB/AGE: 11/17/1940 74 y.o. Primary Care Physician:Tamana Hatfield L, MD Admit date: 03/14/2015 Discharge date: 03/21/2015    Discharge Diagnoses:   Active Problems:   Sepsis   Acute renal insufficiency   Leukocytosis   HCAP (healthcare-associated pneumonia)   Acute respiratory failure with hypercapnia   Acute encephalopathy     Medication List    TAKE these medications        albuterol (2.5 MG/3ML) 0.083% nebulizer solution  Commonly known as:  PROVENTIL  Take 2.5 mg by nebulization 3 (three) times daily.     alendronate 70 MG tablet  Commonly known as:  FOSAMAX  Take 70 mg by mouth every Friday. Take with a full glass of water on an empty stomach.     ALPRAZolam 1 MG tablet  Commonly known as:  XANAX  Take 1 mg by mouth at bedtime.     aspirin EC 81 MG tablet  Take 81 mg by mouth daily.     cephALEXin 500 MG capsule  Commonly known as:  KEFLEX  Take 1 capsule (500 mg total) by mouth 3 (three) times daily.     diltiazem 240 MG 24 hr capsule  Commonly known as:  TIAZAC  Take 240 mg by mouth every morning.     ferrous sulfate 325 (65 FE) MG tablet  Take 325 mg by mouth daily.     FLUoxetine 20 MG tablet  Commonly known as:  PROZAC  Take 20 mg by mouth every morning.     fluticasone 50 MCG/ACT nasal spray  Commonly known as:  FLONASE  Place 2 sprays into the nose daily.     furosemide 40 MG tablet  Commonly known as:  LASIX  Take 1 tablet (40 mg total) by mouth every morning. As needed for edema     HYDROcodone-acetaminophen 5-325 MG per tablet  Commonly known as:  NORCO/VICODIN  Take 1 tablet by mouth every 6 (six) hours as needed.     pantoprazole 40 MG tablet  Commonly known as:  PROTONIX  Take 1 tablet by mouth 2 (two) times daily.     predniSONE 50 MG tablet  Commonly known as:  DELTASONE  One tablet PO daily for 4 days     predniSONE 10 MG tablet  Commonly known as:   DELTASONE  Take 1 tablet (10 mg total) by mouth daily with breakfast.     risperiDONE 0.5 MG tablet  Commonly known as:  RISPERDAL  Take 0.5 mg by mouth at bedtime.     tiotropium 18 MCG inhalation capsule  Commonly known as:  SPIRIVA  Place 18 mcg into inhaler and inhale every morning.        Discharged Condition: Improved    Consults: None  Significant Diagnostic Studies: Dg Chest 2 View  03/13/2015   CLINICAL DATA:  Chest and back pain  EXAM: CHEST  2 VIEW  COMPARISON:  February 11, 2015  FINDINGS: There is evidence of a degree of underlying emphysematous change. There is no edema or consolidation. Heart is upper normal in size with pulmonary vascularity within normal limits. There is atherosclerotic change in the aortic arch region. No adenopathy. Stable prior left sixth rib fracture. There is slight anterior wedging of a lower thoracic vertebral body.  IMPRESSION: No edema or consolidation.  Underlying emphysematous change.   Electronically Signed   By: Lowella Grip III M.D.   On: 03/13/2015 10:02   Dg Lumbar Spine  Complete  03/13/2015   CLINICAL DATA:  Three-day history of lumbago  EXAM: LUMBAR SPINE - COMPLETE 4+ VIEW  COMPARISON:  CT abdomen and pelvis with bony reformats August 20, 2012  FINDINGS: Frontal, lateral, spot lumbosacral lateral, and bilateral oblique views were obtained. There are 5 non-rib-bearing lumbar type vertebral bodies. There is stable anterior wedging of the L1 vertebral body. There is mild concavity of the posterior superior endplate of the L4 vertebral body, stable. There is no new fracture. There is no spondylolisthesis. There is moderate disc space narrowing at T12-L1, L1-2, and L2-3. There is milder narrowing at L3-4 L4-5. There is facet osteoarthritic change at L3-4, L4-5, and L5-S1 bilaterally. There is atherosclerotic change in the aorta and iliac arteries.  IMPRESSION: Stable anterior wedge compression fracture of L1. Stable posterior superior  endplate concavity at L4. No new fracture. Moderate osteoarthritic change. No spondylolisthesis.   Electronically Signed   By: Lowella Grip III M.D.   On: 03/13/2015 10:15   Dg Chest Portable 1 View  03/14/2015   CLINICAL DATA:  Altered mental status and decreased oxygen saturation  EXAM: PORTABLE CHEST - 1 VIEW  COMPARISON:  March 13, 2015  FINDINGS: There is patchy interstitial and alveolar opacity throughout much of the right mid and lower lung zone. The left lung is clear. Heart is upper normal in size with pulmonary vascularity within normal limits. No adenopathy. There is atherosclerotic calcification in the aortic arch.  IMPRESSION: Patchy interstitial and alveolar opacity in the right mid and lower lung zones. The appearance is consistent with pneumonia. Aspiration could present in this manner as well. Left lung clear. No change in cardiac silhouette.   Electronically Signed   By: Lowella Grip III M.D.   On: 03/14/2015 08:38   Dg Humerus Right  03/19/2015   CLINICAL DATA:  Evaluate RIGHT upper extremity PICC. Dysfunctional line.  EXAM: RIGHT HUMERUS - 2+ VIEW  COMPARISON:  None.  FINDINGS: There is a kink in the PICC close to the hub. Distal into the PICC is in the upper superior vena cava. This is difficult to visualize due to overlying bones. This was not present on the most recent comparison exam 03/14/2015. Visible RIGHT chest appears within normal limits. Aortic arch atherosclerosis. Cardiopericardial silhouette partially visible.  IMPRESSION: Kink in the PICC near the hub, either external to the patient or in the superficial subcutaneous tissues.   Electronically Signed   By: Dereck Ligas M.D.   On: 03/19/2015 16:57    Lab Results: Basic Metabolic Panel:  Recent Labs  03/20/15 0503  NA 143  K 4.7  CL 99*  CO2 37*  GLUCOSE 153*  BUN 44*  CREATININE 1.00  CALCIUM 8.9   Liver Function Tests: No results for input(s): AST, ALT, ALKPHOS, BILITOT, PROT, ALBUMIN in the last  72 hours.   CBC:  Recent Labs  03/18/15 1002  WBC 11.3*  NEUTROABS 10.7*  HGB 9.4*  HCT 31.0*  MCV 98.1  PLT 441*    Recent Results (from the past 240 hour(s))  Urine culture     Status: None   Collection Time: 03/14/15  8:15 AM  Result Value Ref Range Status   Specimen Description URINE, CATHETERIZED  Final   Special Requests Normal  Final   Colony Count NO GROWTH Performed at [facility]   Final   Culture NO GROWTH Performed at [facility]   Final   Report Status 03/15/2015 FINAL  Final  MRSA PCR Screening  Status: None   Collection Time: 03/14/15 11:04 AM  Result Value Ref Range Status   MRSA by PCR NEGATIVE NEGATIVE Final    Comment:        The GeneXpert MRSA Assay (FDA approved for NASAL specimens only), is one component of a comprehensive MRSA colonization surveillance program. It is not intended to diagnose MRSA infection nor to guide or monitor treatment for MRSA infections.   Culture, blood (x 2)     Status: None   Collection Time: 03/14/15 12:00 PM  Result Value Ref Range Status   Specimen Description BLOOD RIGHT HAND  Final   Special Requests BOTTLES DRAWN AEROBIC AND ANAEROBIC 6CC  Final   Culture NO GROWTH 5 DAYS  Final   Report Status 03/19/2015 FINAL  Final  Culture, blood (routine x 2) Call MD if unable to obtain prior to antibiotics being given     Status: None   Collection Time: 03/14/15  2:30 PM  Result Value Ref Range Status   Specimen Description BLOOD PORTA CATH DRAWN BY RN  Final   Special Requests BOTTLES DRAWN AEROBIC AND ANAEROBIC [place]  Final   Culture NO GROWTH 5 DAYS  Final   Report Status 03/19/2015 FINAL  Final     Hospital Course: This is a 74 year old with severe COPD. She has been living in an assisted living facility for several years. She has recently been having more trouble and has been in and out of the hospital. She was brought to the emergency department with increasing shortness of breath and was  found to have pneumonia. She was in acute respiratory failure. She was treated with BiPAP. She improved but it appeared that she was going to require BiPAP at night. This was arranged. She is already on oxygen. She was back at baseline by the time of discharge  Discharge Exam: Blood pressure 150/52, pulse 72, temperature 97.1 F (36.2 C), temperature source Oral, resp. rate 20, height 5\' 9"  (1.753 m), weight 80.287 kg (177 lb), SpO2 98 %. She is awake and alert. Her chest is clearing. Her heart is regular.  Disposition: Back to her assisted living facility with help from home health services      Discharge Instructions    Face-to-face encounter (required for Medicare/Medicaid patients)    Complete by:  As directed   I Terrick Allred L certify that this patient is under my care and that I, or a nurse practitioner or physician's assistant working with me, had a face-to-face encounter that meets the physician face-to-face encounter requirements with this patient on 03/21/2015. The encounter with the patient was in whole, or in part for the following medical condition(s) which is the primary reason for home health care (List medical condition): COPD/pneumonia  The encounter with the patient was in whole, or in part, for the following medical condition, which is the primary reason for home health care:  COPD/pneumonia  I certify that, based on my findings, the following services are medically necessary home health services:   Nursing Physical therapy    Reason for Medically Necessary Home Health Services:  Skilled Nursing- Change/Decline in Patient Status  My clinical findings support the need for the above services:  Shortness of breath with activity  Further, I certify that my clinical findings support that this patient is homebound due to:  Shortness of Breath with activity     Home Health    Complete by:  As directed   To provide the following care/treatments:  RN  Follow-up  Information    Follow up with [facility].   Contact information:   [address] [place] [address] [phone]       Signed: Alonza Bogus   03/21/2015, 9:20 AM

## 2015-03-21 NOTE — Progress Notes (Signed)
Subjective: She says she feels better and wants to go home. She did take her BiPAP off last night but looks good this morning. She has no new complaints  Objective: Vital signs in last 24 hours: Temp:  [97.1 F (36.2 C)-97.8 F (36.6 C)] 97.1 F (36.2 C) (05/08 6599) Pulse Rate:  [71-83] 72 (05/08 0642) Resp:  [11-23] 20 (05/08 0642) BP: (113-150)/(52-79) 150/52 mmHg (05/08 0642) SpO2:  [91 %-100 %] 98 % (05/08 0731) Weight:  [80.287 kg (177 lb)] 80.287 kg (177 lb) (05/08 0642) Weight change: 0.487 kg (1 lb 1.2 oz) Last BM Date: 03/16/15  Intake/Output from previous day: 05/07 0701 - 05/08 0700 In: 1610 [P.O.:1560; IV Piggyback:50] Out: 650 [Urine:650]  PHYSICAL EXAM General appearance: alert, cooperative and no distress Resp: rhonchi bilaterally Cardio: regular rate and rhythm, S1, S2 normal, no murmur, click, rub or gallop GI: soft, non-tender; bowel sounds normal; no masses,  no organomegaly Extremities: extremities normal, atraumatic, no cyanosis or edema  Lab Results:  Results for orders placed or performed during the hospital encounter of 03/14/15 (from the past 48 hour(s))  Glucose, capillary     Status: Abnormal   Collection Time: 03/19/15 11:04 AM  Result Value Ref Range   Glucose-Capillary 179 (H) 70 - 99 mg/dL   Comment 1 Notify RN    Comment 2 Document in Chart   Glucose, capillary     Status: Abnormal   Collection Time: 03/19/15  3:28 PM  Result Value Ref Range   Glucose-Capillary 239 (H) 70 - 99 mg/dL  Glucose, capillary     Status: Abnormal   Collection Time: 03/19/15  8:23 PM  Result Value Ref Range   Glucose-Capillary 173 (H) 70 - 99 mg/dL   Comment 1 Notify RN   Basic metabolic panel     Status: Abnormal   Collection Time: 03/20/15  5:03 AM  Result Value Ref Range   Sodium 143 135 - 145 mmol/L   Potassium 4.7 3.5 - 5.1 mmol/L   Chloride 99 (L) 101 - 111 mmol/L   CO2 37 (H) 22 - 32 mmol/L   Glucose, Bld 153 (H) 70 - 99 mg/dL   BUN 44 (H) 6 - 20  mg/dL   Creatinine, Ser 1.00 0.44 - 1.00 mg/dL   Calcium 8.9 8.9 - 10.3 mg/dL   GFR calc non Af Amer 55 (L) >60 mL/min   GFR calc Af Amer >60 >60 mL/min    Comment: (NOTE) The eGFR has been calculated using the CKD EPI equation. This calculation has not been validated in all clinical situations. eGFR's persistently <60 mL/min signify possible Chronic Kidney Disease.    Anion gap 7 5 - 15  Glucose, capillary     Status: Abnormal   Collection Time: 03/20/15  7:26 AM  Result Value Ref Range   Glucose-Capillary 148 (H) 70 - 99 mg/dL   Comment 1 Notify RN    Comment 2 Document in Chart   Glucose, capillary     Status: Abnormal   Collection Time: 03/20/15 11:20 AM  Result Value Ref Range   Glucose-Capillary 139 (H) 70 - 99 mg/dL   Comment 1 Notify RN    Comment 2 Document in Chart   Glucose, capillary     Status: Abnormal   Collection Time: 03/20/15  5:30 PM  Result Value Ref Range   Glucose-Capillary 160 (H) 70 - 99 mg/dL  Glucose, capillary     Status: Abnormal   Collection Time: 03/20/15 10:10 PM  Result  Value Ref Range   Glucose-Capillary 152 (H) 70 - 99 mg/dL  Glucose, capillary     Status: Abnormal   Collection Time: 03/21/15  7:44 AM  Result Value Ref Range   Glucose-Capillary 136 (H) 70 - 99 mg/dL    ABGS  Recent Labs  03/19/15 0755  PHART 7.257*  PO2ART 76.1*  TCO2 32.1  HCO3 33.5*   CULTURES Recent Results (from the past 240 hour(s))  Urine culture     Status: None   Collection Time: 03/14/15  8:15 AM  Result Value Ref Range Status   Specimen Description URINE, CATHETERIZED  Final   Special Requests Normal  Final   Colony Count NO GROWTH Performed at [facility]   Final   Culture NO GROWTH Performed at [facility]   Final   Report Status 03/15/2015 FINAL  Final  MRSA PCR Screening     Status: None   Collection Time: 03/14/15 11:04 AM  Result Value Ref Range Status   MRSA by PCR NEGATIVE NEGATIVE Final    Comment:        The  GeneXpert MRSA Assay (FDA approved for NASAL specimens only), is one component of a comprehensive MRSA colonization surveillance program. It is not intended to diagnose MRSA infection nor to guide or monitor treatment for MRSA infections.   Culture, blood (x 2)     Status: None   Collection Time: 03/14/15 12:00 PM  Result Value Ref Range Status   Specimen Description BLOOD RIGHT HAND  Final   Special Requests BOTTLES DRAWN AEROBIC AND ANAEROBIC 6CC  Final   Culture NO GROWTH 5 DAYS  Final   Report Status 03/19/2015 FINAL  Final  Culture, blood (routine x 2) Call MD if unable to obtain prior to antibiotics being given     Status: None   Collection Time: 03/14/15  2:30 PM  Result Value Ref Range Status   Specimen Description BLOOD PORTA CATH DRAWN BY RN  Final   Special Requests BOTTLES DRAWN AEROBIC AND ANAEROBIC 6CC  Final   Culture NO GROWTH 5 DAYS  Final   Report Status 03/19/2015 FINAL  Final   Studies/Results: Dg Humerus Right  03/19/2015   CLINICAL DATA:  Evaluate RIGHT upper extremity PICC. Dysfunctional line.  EXAM: RIGHT HUMERUS - 2+ VIEW  COMPARISON:  None.  FINDINGS: There is a kink in the PICC close to the hub. Distal into the PICC is in the upper superior vena cava. This is difficult to visualize due to overlying bones. This was not present on the most recent comparison exam 03/14/2015. Visible RIGHT chest appears within normal limits. Aortic arch atherosclerosis. Cardiopericardial silhouette partially visible.  IMPRESSION: Kink in the PICC near the hub, either external to the patient or in the superficial subcutaneous tissues.   Electronically Signed   By: Dereck Ligas M.D.   On: 03/19/2015 16:57    Medications:  Prior to Admission:  Prescriptions prior to admission  Medication Sig Dispense Refill Last Dose  . albuterol (PROVENTIL) (2.5 MG/3ML) 0.083% nebulizer solution Take 2.5 mg by nebulization 3 (three) times daily.   03/13/2015 at Unknown time  . alendronate  (FOSAMAX) 70 MG tablet Take 70 mg by mouth every Friday. Take with a full glass of water on an empty stomach.   03/12/2015 at Unknown time  . ALPRAZolam (XANAX) 1 MG tablet Take 1 mg by mouth at bedtime.   03/13/2015 at Unknown time  . aspirin EC 81 MG tablet Take 81 mg  by mouth daily.   03/12/2015 at Unknown time  . diltiazem (TIAZAC) 240 MG 24 hr capsule Take 240 mg by mouth every morning.    03/12/2015 at Unknown time  . ferrous sulfate 325 (65 FE) MG tablet Take 325 mg by mouth daily.   03/12/2015 at Unknown time  . FLUoxetine (PROZAC) 20 MG tablet Take 20 mg by mouth every morning.    03/12/2015 at Unknown time  . fluticasone (FLONASE) 50 MCG/ACT nasal spray Place 2 sprays into the nose daily.   03/13/2015 at Unknown time  . furosemide (LASIX) 40 MG tablet Take 1 tablet (40 mg total) by mouth every morning. As needed for edema 30 tablet 5 03/12/2015 at Unknown time  . HYDROcodone-acetaminophen (NORCO/VICODIN) 5-325 MG per tablet Take 1 tablet by mouth every 6 (six) hours as needed. 15 tablet 0 03/13/2015 at Unknown time  . pantoprazole (PROTONIX) 40 MG tablet Take 1 tablet by mouth 2 (two) times daily.   03/13/2015 at Unknown time  . risperiDONE (RISPERDAL) 0.5 MG tablet Take 0.5 mg by mouth at bedtime.   03/13/2015 at Unknown time  . tiotropium (SPIRIVA) 18 MCG inhalation capsule Place 18 mcg into inhaler and inhale every morning.   03/13/2015 at Unknown time  . predniSONE (DELTASONE) 50 MG tablet One tablet PO daily for 4 days (Patient not taking: Reported on 03/13/2015) 4 tablet 0 Not Taking at Unknown time   Scheduled: . albuterol  2.5 mg Nebulization TID  . ALPRAZolam  1 mg Oral QHS  . antiseptic oral rinse  7 mL Mouth Rinse q12n4p  . aspirin EC  81 mg Oral Daily  . cefTRIAXone (ROCEPHIN)  IV  1 g Intravenous Q24H  . chlorhexidine  15 mL Mouth Rinse BID  . diltiazem  240 mg Oral q morning - 10a  . FLUoxetine  20 mg Oral q morning - 10a  . heparin  5,000 Units Subcutaneous 3 times per day  .  insulin aspart  0-9 Units Subcutaneous TID AC & HS  . pantoprazole  40 mg Oral BID  . predniSONE  40 mg Oral Q breakfast  . risperiDONE  0.5 mg Oral QHS  . sodium chloride  10-40 mL Intracatheter Q12H  . sodium chloride  10-40 mL Intracatheter Q12H  . tiotropium  18 mcg Inhalation q morning - 10a   Continuous:  ZDG:[id number] **OR** acetaminophen, LORazepam, ondansetron **OR** ondansetron (ZOFRAN) IV, sodium chloride, sodium chloride  Assesment: She was admitted with healthcare associated pneumonia. She has acute hypercapnic respiratory failure and she's going to go home on BiPAP. She was septic on admission and this is better. She is overall much improved. Active Problems:   Sepsis   Acute renal insufficiency   Leukocytosis   HCAP (healthcare-associated pneumonia)   Acute respiratory failure with hypercapnia   Acute encephalopathy    Plan: Discharge today    LOS: 7 days   Marchetta Navratil L 03/21/2015, 9:13 AM

## 2015-03-21 NOTE — Progress Notes (Signed)
Patient woke up tugging at her bipap mask. States she wants to take the mask off. Bipap removed and patient placed back on 3 liters of O2 per nasal cannula. O2 sats are 94% on 3L O2 and patient is in no acute distress at this time. Contacted RT to notify that the Bipap was off. Will continue to monitor closely.

## 2015-03-22 ENCOUNTER — Inpatient Hospital Stay
Admission: RE | Admit: 2015-03-22 | Discharge: 2015-05-15 | Disposition: A | Discharge status: Home / Self Care | Payer: Medicare Other | Source: Ambulatory Visit | Attending: Internal Medicine | Admitting: Internal Medicine

## 2015-03-22 DIAGNOSIS — R059 Cough, unspecified: Principal | ICD-10-CM

## 2015-03-22 DIAGNOSIS — F319 Bipolar disorder, unspecified: Secondary | ICD-10-CM | POA: Diagnosis not present

## 2015-03-22 DIAGNOSIS — R000 Tachycardia, unspecified: Secondary | ICD-10-CM | POA: Diagnosis not present

## 2015-03-22 DIAGNOSIS — I10 Essential (primary) hypertension: Secondary | ICD-10-CM | POA: Diagnosis not present

## 2015-03-22 DIAGNOSIS — R262 Difficulty in walking, not elsewhere classified: Secondary | ICD-10-CM | POA: Diagnosis not present

## 2015-03-22 DIAGNOSIS — R278 Other lack of coordination: Secondary | ICD-10-CM | POA: Diagnosis not present

## 2015-03-22 DIAGNOSIS — M6281 Muscle weakness (generalized): Secondary | ICD-10-CM | POA: Diagnosis not present

## 2015-03-22 DIAGNOSIS — J449 Chronic obstructive pulmonary disease, unspecified: Secondary | ICD-10-CM | POA: Diagnosis not present

## 2015-03-22 DIAGNOSIS — J440 Chronic obstructive pulmonary disease with acute lower respiratory infection: Secondary | ICD-10-CM | POA: Diagnosis not present

## 2015-03-22 DIAGNOSIS — J9692 Respiratory failure, unspecified with hypercapnia: Secondary | ICD-10-CM | POA: Diagnosis not present

## 2015-03-22 DIAGNOSIS — R488 Other symbolic dysfunctions: Secondary | ICD-10-CM | POA: Diagnosis not present

## 2015-03-22 DIAGNOSIS — D649 Anemia, unspecified: Secondary | ICD-10-CM | POA: Diagnosis not present

## 2015-03-22 DIAGNOSIS — J9691 Respiratory failure, unspecified with hypoxia: Secondary | ICD-10-CM | POA: Diagnosis not present

## 2015-03-22 DIAGNOSIS — R05 Cough: Principal | ICD-10-CM

## 2015-03-22 LAB — GLUCOSE, CAPILLARY
Glucose-Capillary: 97 mg/dL (ref 70–99)
Glucose-Capillary: 187 mg/dL — ABNORMAL HIGH (ref 70–99)

## 2015-03-22 NOTE — Plan of Care (Signed)
Problem: Discharge Progression Outcomes Goal: Discharge plan in place and appropriate Outcome: Completed/Met Date Met:  03/22/15 [facility] Goal: Hemodynamically stable Outcome: Adequate for Discharge To [facility]

## 2015-03-22 NOTE — Progress Notes (Signed)
Physical Therapy Treatment Patient Details Name: April Pearson MRN: [id number] DOB: 1941-05-29 Today's Date: 03/22/2015    History of Present Illness HPI: April Pearson is a 74 y.o. female recently discharged from the hospital on 11/13/2015 for healthcare associated pneumonia, she was at the skilled nursing facility. She was recently in the hospital for back pain day prior to admission she was given narcotics and was sent back to her facility. Comes in as she was found down in the floor confused. She was unresponsive at the field she was started on a nonrebreather and given Narcan without any response. As per family members she's had no fever chills, or cough. The patient relates she is struggling to breathe on BiPAP. Family members relate that she was not confused the day prior to admission.    PT Comments    Pt was seen for transfer and gait training over the weekend.  Per pt report, she has not been out of the bed for pottying and only sat in a chair once since last week.  Her overall strength and stability have declined over that time.  She now needs assist to mobilize OOB, now needs mod to max assist to stand from sitting and gait stablity is poor with a walker.  I am very concerned about her ability to return to [facility] and am now recommending SNF at d/c as she is a very high fall risk.  Follow Up Recommendations  SNF     Equipment Recommendations  None recommended by PT    Recommendations for Other Services       Precautions / Restrictions Precautions Precautions: Fall Restrictions Weight Bearing Restrictions: No    Mobility  Bed Mobility Overal bed mobility: Needs Assistance Bed Mobility: Supine to Sit     Supine to sit: Min assist;HOB elevated        Transfers Overall transfer level: Needs assistance Equipment used: Rolling walker (2 wheeled) Transfers: Sit to/from Stand Sit to Stand: Mod assist         General transfer comment: pt needs max assist to rise  from a commode (low height)  Ambulation/Gait Ambulation/Gait assistance: Min assist Ambulation Distance (Feet): 20 Feet (10' x 1) Assistive device: Rolling walker (2 wheeled) Gait Pattern/deviations: Trunk flexed;Decreased stride length Gait velocity: slow, labored Gait velocity interpretation: Below normal speed for age/gender                         Balance Overall balance assessment: No apparent balance deficits (not formally assessed)                                  Cognition Arousal/Alertness: Lethargic Behavior During Therapy: WFL for tasks assessed/performed Overall Cognitive Status: Within Functional Limits for tasks assessed                            General Comments        Pertinent Vitals/Pain Pain Assessment: No/denies pain                                      PT Goals (current goals can now be found in the care plan section) Progress towards PT goals: Not progressing toward goals - comment (has become weaker over the weekend)  PT Plan Discharge plan needs to be updated                 End of Session Equipment Utilized During Treatment: Gait belt Activity Tolerance: Patient limited by fatigue Patient left: in chair;with call bell/phone within reach;with family/visitor present     Time: 6168-3729 PT Time Calculation (min) (ACUTE ONLY): 33 min  Charges:  $Gait Training: 8-22 mins $Therapeutic Activity: 8-22 mins                    G Codes:      Sable Feil 03-26-2015, 11:28 AM

## 2015-03-22 NOTE — Clinical Social Work Note (Signed)
PT recommending SNF this morning. Pt faxed out and bed accepted at [facility]. Facility notified. Will transfer with staff.  April Pearson, [place]

## 2015-03-22 NOTE — Progress Notes (Signed)
There apparently was some confusion about her discharge yesterday and she was not discharged. She is ready for discharge now.

## 2015-03-22 NOTE — Progress Notes (Signed)
Report called to [place] staff at [facility]

## 2015-03-22 NOTE — Clinical Social Work Placement (Signed)
   CLINICAL SOCIAL WORK PLACEMENT  NOTE  Date:  03/22/2015  Patient Details  Name: April Pearson MRN: [id number] Date of Birth: 07-02-1941  Clinical Social Work is seeking post-discharge placement for this patient at the [place] level of care (*CSW will initial, date and re-position this form in  chart as items are completed):  Yes   Patient/family provided with [place] Work Department's list of facilities offering this level of care within the geographic area requested by the patient (or if unable, by the patient's family).  Yes   Patient/family informed of their freedom to choose among providers that offer the needed level of care, that participate in Medicare, Medicaid or managed care program needed by the patient, have an available bed and are willing to accept the patient.  Yes   Patient/family informed of [facility]'s ownership interest in [facility] and [facility], as well as of the fact that they are under no obligation to receive care at these facilities.  PASRR submitted to EDS on 03/22/15     PASRR number received on 03/22/15     Existing PASRR number confirmed on       FL2 transmitted to all facilities in geographic area requested by pt/family on 03/22/15     FL2 transmitted to all facilities within larger geographic area on 03/22/15     Patient informed that his/her managed care company has contracts with or will negotiate with certain facilities, including the following:        Yes   Patient/family informed of bed offers received.  Patient chooses bed at  [facility])     Physician recommends and patient chooses bed at      Patient to be transferred to [facility] on 03/22/15.  Patient to be transferred to facility by staff     Patient family notified on 03/22/15 of transfer.  Name of family member notified:  [name]- sister     PHYSICIAN       Additional Comment:     _______________________________________________ Salome Arnt, LCSW 03/22/2015, 2:32 PM

## 2015-03-22 NOTE — Plan of Care (Signed)
Problem: Discharge Progression Outcomes Goal: O2 sats > or equal 90% or at baseline Outcome: Completed/Met Date Met:  03/22/15 With O2

## 2015-03-22 NOTE — Progress Notes (Signed)
UR chart review completed.  

## 2015-03-22 NOTE — Care Management Note (Signed)
Case Management Note  Patient Details  Name: KATIRIA CALAME MRN: [id number] Date of Birth: 1941/06/04  Subjective/Objective:                    Action/Plan:   Expected Discharge Date:  03/18/15               Expected Discharge Plan:  Assisted Living / Rest Home  In-House Referral:  Clinical Social Work  Discharge planning Services  CM Consult  Post Acute Care Choice:  Durable Medical Equipment Choice offered to:  Patient  DME Arranged:  Bipap DME Agency:  [place] Arranged:  PT, RN [facility] Agency:  [place]  Status of Service:  Completed, signed off  Medicare Important Message Given:  Yes Date Medicare IM Given:  03/19/15 Medicare IM give by:  Jolene Provost, RN, MSN, CM Date Additional Medicare IM Given:  03/22/15 Additional Medicare Important Message give by:  Christinia Gully, RN BSN CM  If discussed at [employer], dates discussed:    Additional Comments: Pt discharged home today with [facility] RN and PT (per pts choice). Emma with [facility] is aware and will collect the pts information from the chart. [place] services to start within 48 hours of discharge. Bipap ordered from [facility] per pts choice. Bipap will be delivered to pts home after discharge. No other DME needs noted. Pt and pts nurse aware of discharge arrangements. Christinia Gully [place], RN 03/22/2015, 8:15 AM

## 2015-03-22 NOTE — Clinical Social Work Note (Signed)
CSW spoke with patient's sister, Doren Custard and advised that patient was being discharged today.    CSW spoke with Winifred Olive of [place].  CSW advised that patient was being discharged today.  Ms. Celine Ahr indicated that she would pick patient up after she left another appointment with a resident later in the afternnoon.  CSW signing off.

## 2015-03-23 ENCOUNTER — Other Ambulatory Visit: Payer: Self-pay | Admitting: *Deleted

## 2015-03-23 MED ORDER — HYDROCODONE-ACETAMINOPHEN 5-325 MG PO TABS: ORAL_TABLET | ORAL | Status: DC

## 2015-03-23 MED ORDER — ALPRAZOLAM 1 MG PO TABS: ORAL_TABLET | ORAL | Status: DC

## 2015-03-23 NOTE — Telephone Encounter (Signed)
[place]

## 2015-03-23 NOTE — Telephone Encounter (Signed)
[facility] 

## 2015-03-28 DIAGNOSIS — J449 Chronic obstructive pulmonary disease, unspecified: Secondary | ICD-10-CM | POA: Diagnosis not present

## 2015-03-31 ENCOUNTER — Ambulatory Visit: Payer: Medicare Other | Admitting: Gastroenterology

## 2015-04-18 DIAGNOSIS — J449 Chronic obstructive pulmonary disease, unspecified: Secondary | ICD-10-CM | POA: Diagnosis not present

## 2015-04-28 NOTE — H&P (Signed)
[name], April Pearson              ACCOUNT NO.:  [id number]  MEDICAL RECORD NO.:  [id number]  LOCATION:                                 FACILITY:  PHYSICIAN:  Alejandra Hunt L. Luan Pulling, M.D.DATE OF BIRTH:  February 04, 1941  DATE OF ADMISSION:  03/22/2015 DATE OF DISCHARGE:  LH                             HISTORY & PHYSICAL   HISTORY OF PRESENT ILLNESS:  This is a 74 year old who has known severe COPD.  She had been in the hospital in May of this year with respiratory failure and had to be placed on BiPAP.  She had been doing well at an assisted living facility.  She had been hospitalized earlier in the year in December with healthcare associated pneumonia.  Her white blood count was about 30,000.  She was treated in the hospital with steroids, antibiotics, and had consultation with Physical Therapy, and it was felt that she was going to need skilled care facility placement.  She was septic from healthcare associated pneumonia, had acute renal insufficiency, acute hypercapnic respiratory failure, and acute encephalopathy.  PAST MEDICAL HISTORY:  Her past medical history is positive for COPD, anxiety and depression/bipolar disease, chronic tachycardia, GERD, bladder cancer, and history of pneumonia.  PAST SURGICAL HISTORY:  Surgically, she has had several bladder surgeries for bladder cancer.  She has had an excision of a benign breast tumor.  She has had cataract surgery.  SOCIAL HISTORY:  She has stopped smoking about 15 years ago but had an 56 pack-a-year smoking history.  She does not use alcohol or use illicit drugs.  She has lived in an assisted living facility for at least 10 years until her placement in a skilled care facility.  FAMILY HISTORY:  Shows positive for coronary artery occlusive disease in her father.  Mother's health history is unknown.  MEDICATIONS:  Her medications at discharge were, 1. Albuterol nebulizer solution 3 times a day. 2. Fosamax 70 mg daily. 3. Xanax 1 mg  at bedtime.  I think she actually takes that 3 times a     day. 4. Aspirin 81 mg daily. 5. Keflex which she has finished. 6. Diltiazem 240 mg daily. 7. Ferrous sulfate 325 mg daily. 8. Prozac 20 mg daily. 9. Flonase 2 sprays each nostril daily. 10.Lasix 40 mg daily as needed for edema. 11.Hydrocodone/acetaminophen 1 every 6 hours as needed for moderate     pain. 12.Protonix 40 mg b.i.d. 13.Prednisone 50 mg daily for 4 days, then 10 mg daily on a regular     basis. 14.Risperidone 0.5 at bedtime. 15.Spiriva 18 mcg daily.  REVIEW OF SYSTEMS:  Shows that she is still weak.  She is still short of breath.  PHYSICAL EXAMINATION:  GENERAL:  Shows a well developed, well-nourished female who is in no acute distress.  She looks somewhat pale.  She is wearing oxygen. HEENT:  Her pupils are reactive.  Nose and throat are clear. NECK:  Supple without masses. CHEST:  Clear with somewhat diminished breath sounds. HEART:  Regular without gallop. ABDOMEN:  Soft without masses. EXTREMITIES:  No edema. CENTRAL NERVOUS SYSTEM:  Grossly intact.  ASSESSMENT AND PLAN:  She has severe chronic obstructive pulmonary disease.  She  is undergoing rehabilitation and will continue that.  She also has bipolar disease which is stable; anemia, multifactorial which appears to be stable now; chronic tachycardia which is unchanged; and a history of bladder cancer in which she is being followed by her urologist.  We will plan to continue with current treatments and follow from there.     Darcy Cordner L. Luan Pulling, M.D.     ELH/MEDQ  D:  04/19/2015  T:  04/19/2015  Job:  [id number]

## 2015-05-11 ENCOUNTER — Ambulatory Visit (HOSPITAL_COMMUNITY): Payer: Medicare Other | Attending: Internal Medicine

## 2015-05-11 ENCOUNTER — Encounter (HOSPITAL_COMMUNITY): Payer: Self-pay | Admitting: Cardiology

## 2015-05-11 DIAGNOSIS — R0989 Other specified symptoms and signs involving the circulatory and respiratory systems: Secondary | ICD-10-CM | POA: Diagnosis not present

## 2015-05-11 DIAGNOSIS — J449 Chronic obstructive pulmonary disease, unspecified: Secondary | ICD-10-CM | POA: Insufficient documentation

## 2015-05-11 DIAGNOSIS — R05 Cough: Secondary | ICD-10-CM | POA: Diagnosis not present

## 2015-05-13 ENCOUNTER — Encounter (HOSPITAL_COMMUNITY)
Admission: RE | Admit: 2015-05-13 | Discharge: 2015-05-13 | Disposition: A | Discharge status: Home / Self Care | Payer: Medicare Other | Source: Skilled Nursing Facility | Attending: Pulmonary Disease | Admitting: Pulmonary Disease

## 2015-05-13 DIAGNOSIS — D649 Anemia, unspecified: Secondary | ICD-10-CM | POA: Insufficient documentation

## 2015-05-13 DIAGNOSIS — I10 Essential (primary) hypertension: Secondary | ICD-10-CM | POA: Diagnosis not present

## 2015-05-13 LAB — CBC WITH DIFFERENTIAL/PLATELET
BASOS ABS: 0 10*3/uL (ref 0.0–0.1)
Basophils Relative: 0 % (ref 0–1)
Eosinophils Absolute: 0.2 10*3/uL (ref 0.0–0.7)
Eosinophils Relative: 1 % (ref 0–5)
HCT: 26.6 % — ABNORMAL LOW (ref 36.0–46.0)
Hemoglobin: 8.2 g/dL — ABNORMAL LOW (ref 12.0–15.0)
LYMPHS PCT: 4 % — AB (ref 12–46)
Lymphs Abs: 0.6 10*3/uL — ABNORMAL LOW (ref 0.7–4.0)
MCH: 31.5 pg (ref 26.0–34.0)
MCHC: 30.8 g/dL (ref 30.0–36.0)
MCV: 102.3 fL — ABNORMAL HIGH (ref 78.0–100.0)
MONOS PCT: 13 % — AB (ref 3–12)
Monocytes Absolute: 1.9 10*3/uL — ABNORMAL HIGH (ref 0.1–1.0)
Neutro Abs: 12.7 10*3/uL — ABNORMAL HIGH (ref 1.7–7.7)
Neutrophils Relative %: 82 % — ABNORMAL HIGH (ref 43–77)
PLATELETS: 346 10*3/uL (ref 150–400)
RBC: 2.6 MIL/uL — ABNORMAL LOW (ref 3.87–5.11)
RDW: 17.2 % — ABNORMAL HIGH (ref 11.5–15.5)
WBC: 15.4 10*3/uL — AB (ref 4.0–10.5)

## 2015-05-13 LAB — BASIC METABOLIC PANEL
Anion gap: 6 (ref 5–15)
BUN: 41 mg/dL — ABNORMAL HIGH (ref 6–20)
CALCIUM: 9.2 mg/dL (ref 8.9–10.3)
CHLORIDE: 93 mmol/L — AB (ref 101–111)
CO2: 44 mmol/L — AB (ref 22–32)
Creatinine, Ser: 1.18 mg/dL — ABNORMAL HIGH (ref 0.44–1.00)
GFR calc non Af Amer: 45 mL/min — ABNORMAL LOW (ref >60)
GFR, EST AFRICAN AMERICAN: 52 mL/min — AB (ref >60)
Glucose, Bld: 113 mg/dL — ABNORMAL HIGH (ref 65–99)
Potassium: 3.9 mmol/L (ref 3.5–5.1)
Sodium: 143 mmol/L (ref 135–145)

## 2015-05-15 ENCOUNTER — Emergency Department (HOSPITAL_COMMUNITY): Payer: Medicare Other

## 2015-05-15 ENCOUNTER — Encounter (HOSPITAL_COMMUNITY): Payer: Self-pay | Admitting: *Deleted

## 2015-05-15 ENCOUNTER — Emergency Department (HOSPITAL_COMMUNITY)
Admission: EM | Admit: 2015-05-15 | Discharge: 2015-05-15 | Disposition: A | Discharge status: Home / Self Care | Payer: Medicare Other | Attending: Emergency Medicine | Admitting: Emergency Medicine

## 2015-05-15 DIAGNOSIS — H6121 Impacted cerumen, right ear: Secondary | ICD-10-CM | POA: Insufficient documentation

## 2015-05-15 DIAGNOSIS — H938X3 Other specified disorders of ear, bilateral: Secondary | ICD-10-CM | POA: Diagnosis present

## 2015-05-15 DIAGNOSIS — R0602 Shortness of breath: Secondary | ICD-10-CM | POA: Insufficient documentation

## 2015-05-15 DIAGNOSIS — M810 Age-related osteoporosis without current pathological fracture: Secondary | ICD-10-CM | POA: Insufficient documentation

## 2015-05-15 DIAGNOSIS — Z8701 Personal history of pneumonia (recurrent): Secondary | ICD-10-CM | POA: Diagnosis not present

## 2015-05-15 DIAGNOSIS — R0789 Other chest pain: Secondary | ICD-10-CM | POA: Diagnosis not present

## 2015-05-15 DIAGNOSIS — Z87891 Personal history of nicotine dependence: Secondary | ICD-10-CM | POA: Insufficient documentation

## 2015-05-15 DIAGNOSIS — Z7982 Long term (current) use of aspirin: Secondary | ICD-10-CM | POA: Diagnosis not present

## 2015-05-15 DIAGNOSIS — R05 Cough: Secondary | ICD-10-CM | POA: Diagnosis not present

## 2015-05-15 DIAGNOSIS — F419 Anxiety disorder, unspecified: Secondary | ICD-10-CM | POA: Diagnosis not present

## 2015-05-15 DIAGNOSIS — Z8619 Personal history of other infectious and parasitic diseases: Secondary | ICD-10-CM | POA: Insufficient documentation

## 2015-05-15 DIAGNOSIS — I209 Angina pectoris, unspecified: Secondary | ICD-10-CM | POA: Insufficient documentation

## 2015-05-15 DIAGNOSIS — Z7952 Long term (current) use of systemic steroids: Secondary | ICD-10-CM | POA: Diagnosis not present

## 2015-05-15 DIAGNOSIS — Z79899 Other long term (current) drug therapy: Secondary | ICD-10-CM | POA: Diagnosis not present

## 2015-05-15 DIAGNOSIS — M159 Polyosteoarthritis, unspecified: Secondary | ICD-10-CM | POA: Insufficient documentation

## 2015-05-15 DIAGNOSIS — Z9981 Dependence on supplemental oxygen: Secondary | ICD-10-CM | POA: Diagnosis not present

## 2015-05-15 DIAGNOSIS — Z7951 Long term (current) use of inhaled steroids: Secondary | ICD-10-CM | POA: Insufficient documentation

## 2015-05-15 DIAGNOSIS — Z8551 Personal history of malignant neoplasm of bladder: Secondary | ICD-10-CM | POA: Insufficient documentation

## 2015-05-15 DIAGNOSIS — J441 Chronic obstructive pulmonary disease with (acute) exacerbation: Secondary | ICD-10-CM | POA: Diagnosis not present

## 2015-05-15 DIAGNOSIS — Z8669 Personal history of other diseases of the nervous system and sense organs: Secondary | ICD-10-CM | POA: Diagnosis not present

## 2015-05-15 DIAGNOSIS — R0682 Tachypnea, not elsewhere classified: Secondary | ICD-10-CM | POA: Diagnosis not present

## 2015-05-15 DIAGNOSIS — H66002 Acute suppurative otitis media without spontaneous rupture of ear drum, left ear: Secondary | ICD-10-CM | POA: Insufficient documentation

## 2015-05-15 DIAGNOSIS — F319 Bipolar disorder, unspecified: Secondary | ICD-10-CM | POA: Insufficient documentation

## 2015-05-15 DIAGNOSIS — Z8719 Personal history of other diseases of the digestive system: Secondary | ICD-10-CM | POA: Insufficient documentation

## 2015-05-15 DIAGNOSIS — Z792 Long term (current) use of antibiotics: Secondary | ICD-10-CM | POA: Insufficient documentation

## 2015-05-15 LAB — CBC
HCT: 26.3 % — ABNORMAL LOW (ref 36.0–46.0)
HEMOGLOBIN: 7.9 g/dL — AB (ref 12.0–15.0)
MCH: 31 pg (ref 26.0–34.0)
MCHC: 30 g/dL (ref 30.0–36.0)
MCV: 103.1 fL — ABNORMAL HIGH (ref 78.0–100.0)
Platelets: 328 10*3/uL (ref 150–400)
RBC: 2.55 MIL/uL — ABNORMAL LOW (ref 3.87–5.11)
RDW: 16.7 % — AB (ref 11.5–15.5)
WBC: 15 10*3/uL — ABNORMAL HIGH (ref 4.0–10.5)

## 2015-05-15 LAB — COMPREHENSIVE METABOLIC PANEL
ALK PHOS: 81 U/L (ref 38–126)
ALT: 14 U/L (ref 14–54)
AST: 12 U/L — AB (ref 15–41)
Albumin: 3.4 g/dL — ABNORMAL LOW (ref 3.5–5.0)
Anion gap: 8 (ref 5–15)
BUN: 43 mg/dL — AB (ref 6–20)
CHLORIDE: 91 mmol/L — AB (ref 101–111)
CO2: 44 mmol/L — AB (ref 22–32)
Calcium: 8.5 mg/dL — ABNORMAL LOW (ref 8.9–10.3)
Creatinine, Ser: 1.43 mg/dL — ABNORMAL HIGH (ref 0.44–1.00)
GFR calc Af Amer: 41 mL/min — ABNORMAL LOW (ref >60)
GFR calc non Af Amer: 35 mL/min — ABNORMAL LOW (ref >60)
Glucose, Bld: 143 mg/dL — ABNORMAL HIGH (ref 65–99)
Potassium: 4 mmol/L (ref 3.5–5.1)
Sodium: 143 mmol/L (ref 135–145)
TOTAL PROTEIN: 6.1 g/dL — AB (ref 6.5–8.1)
Total Bilirubin: 0.4 mg/dL (ref 0.3–1.2)

## 2015-05-15 MED ORDER — AMOXICILLIN 250 MG PO CAPS
ORAL_CAPSULE | ORAL | Status: AC
  Filled 2015-05-15: qty 1

## 2015-05-15 MED ORDER — ALBUTEROL SULFATE (2.5 MG/3ML) 0.083% IN NEBU
2.5000 mg | INHALATION_SOLUTION | Freq: Once | RESPIRATORY_TRACT | Status: AC
  Administered 2015-05-15: 2.5 mg via RESPIRATORY_TRACT
  Filled 2015-05-15: qty 3

## 2015-05-15 MED ORDER — AMOXICILLIN 250 MG PO CAPS
500.0000 mg | ORAL_CAPSULE | Freq: Once | ORAL | Status: AC
  Administered 2015-05-15: 500 mg via ORAL
  Filled 2015-05-15: qty 2

## 2015-05-15 MED ORDER — AMOXICILLIN 500 MG PO CAPS: 500.0000 mg | ORAL_CAPSULE | Freq: Three times a day (TID) | ORAL | Status: DC

## 2015-05-15 MED ORDER — IPRATROPIUM-ALBUTEROL 0.5-2.5 (3) MG/3ML IN SOLN
3.0000 mL | Freq: Once | RESPIRATORY_TRACT | Status: AC
  Administered 2015-05-15: 3 mL via RESPIRATORY_TRACT
  Filled 2015-05-15: qty 3

## 2015-05-15 NOTE — ED Provider Notes (Signed)
CSN: [id number]     Arrival date & time 05/15/15  4098 History  This chart was scribed for April Bo, MD by [name], ED Scribe. This patient was seen in room APA18/APA18 and the patient's care was started at 10:39 AM.   Chief Complaint  Patient presents with  . Cough   Patient is a 74 y.o. female presenting with cough. The history is provided by the patient. No language interpreter was used.  Cough Associated symptoms: shortness of breath    HPI Comments: April Pearson is a 74 y.o. female with a PMHx of pneumonia, emphysema, brought in by EMS from [facility],  who presents to the Emergency Department complaining of moderate productive cough with yellow sputum onset 1 month ago and associated SOB onset earlier today. Pt also complains of "roaring in her ears" onset this morning. Per sister, pt was confused during onset of symptoms earlier this morning. Per nursing staff, she fell this morning. Pt states she uses O2 with nasal canula at baseline.  Per sister, pt had shown some improvement of symptoms since discharged from hospital until earlier today. Pt did not have breakfast this morning, but states that she ate last night. Per EMS, pt typically receives breathing treatment each morning but has not received one yet today. Pt is noted to a be DNR pt.  Past Medical History  Diagnosis Date  . Osteoporosis   . Weakness   . Tachycardia   . Arthritis     KNEES AND HANDS  . Coarse tremors     TREMORS BOTH HANDS - PT STATES SIDE EFFECT OF HER MEDICATIONS FOR HER BREATHING  . Dizziness     sometimes  . Shortness of breath     USES OXYGEN 2 L / MIN NASAL CANNUA   24 HRS A DAY; LIVES IN [name]--CAN AMBULATE SHORT DISTANCE BUT ACTIVITIES VERY LIMITED BY SOB  . Anginal pain     "with fluttering, seen MD about it"  . History of shingles   . Emphysema   . GERD (gastroesophageal reflux disease)     sometimes  . Laryngitis     10/2013, hx of   . Pneumonia     hx of  . Bipolar 1 disorder   . Cough 06/29/14    pt states recent cough / cold - feeling better - cough now non-productive.  . Frequent urination   . COPD (chronic obstructive pulmonary disease)   . Cancer     bladder  . Anxiety   . Depression   . Edema   . Insomnia    Past Surgical History  Procedure Laterality Date  . Bladder surgery    . Cystoscopy w/ retrogrades Bilateral 06/19/2013    Procedure: CYSTOSCOPY WITH BIOSPY AND FULGERATION, BILATERAL RETROGRADE PYELOGRAM;  Surgeon: Malka So, MD;  Location: [facility];  Service: Urology;  Laterality: Bilateral;  . Breast surgery Left     FOR TUMOR - BENIGN  . Cataract extraction Bilateral 3 years ago  . Cystoscopy w/ retrogrades Bilateral 12/25/2013    Procedure: CYSTOSCOPY WITH BILATERAL RETROGRADE WITH BIOPSY WITH FULGERATION;  Surgeon: Irine Seal, MD;  Location: [facility];  Service: Urology;  Laterality: Bilateral;  . Transurethral resection of bladder tumor Bilateral 12/25/2013    Procedure: TRANSURETHRAL RESECTION OF BLADDER TUMOR (TURBT);  Surgeon: Irine Seal, MD;  Location: [facility];  Service: Urology;  Laterality: Bilateral;  . Cystoscopy with retrograde pyelogram, ureteroscopy and stent placement Bilateral 07/02/2014  Procedure: CYSTOSCOPY WITH BILATERAL RETROGRADE PYELOGRAM;  Surgeon: Malka So, MD;  Location: [facility];  Service: Urology;  Laterality: Bilateral;  . Transurethral resection of bladder tumor N/A 07/02/2014    Procedure: TRANSURETHRAL RESECTION OF BLADDER TUMOR (TURBT);  Surgeon: Malka So, MD;  Location: [facility];  Service: Urology;  Laterality: N/A;  . Bladder surgery  2015  . Breast cyst removal    . Colonoscopy N/A 03/03/2015    RMR: Extensive colonic diverticulosis. Multiple colonic polyps-removed as described above. No overt neoplasm seen.   . Esophagogastroduodenoscopy N/A 03/03/2015    RMR: Noncritical. Schatzki's ring status post esophageal dilation as described above. hiatal hernia; otherwise  normal EGD  . Maloney dilation N/A 03/03/2015    Procedure: Venia Minks DILATION;  Surgeon: Daneil Dolin, MD;  Location: [facility];  Service: Endoscopy;  Laterality: N/A;   Family History  Problem Relation Age of Onset  . Hypertension Mother   . Kidney failure Mother   . Cancer Father   . Asthma Father   . Heart failure Father   . Hypertension Father   . Colon cancer Father     diagnosed in his late 32, deceased from lung issues  . Colon cancer Brother     diagnosed in his late 28s, succumbed to the disease   History  Substance Use Topics  . Smoking status: Former Smoker -- 2.00 packs/day for 40 years    Types: Cigarettes    Quit date: 11/13/2000  . Smokeless tobacco: Never Used     Comment: QUIT SMOKING YRS AGO  . Alcohol Use: No   OB History    Gravida Para Term Preterm AB TAB SAB Ectopic Multiple Living   0 0 0 0 0 0 0 0       Review of Systems  Respiratory: Positive for cough, chest tightness and shortness of breath.   All other systems reviewed and are negative.   Allergies  Review of patient's allergies indicates no known allergies.  Home Medications   Prior to Admission medications   Medication Sig Start Date End Date Taking? Authorizing Provider  albuterol (PROVENTIL) (2.5 MG/3ML) 0.083% nebulizer solution Take 2.5 mg by nebulization 3 (three) times daily.   Yes Historical Provider, MD  alendronate (FOSAMAX) 70 MG tablet Take 70 mg by mouth every Friday. Take with a full glass of water on an empty stomach.   Yes Historical Provider, MD  ALPRAZolam Duanne Moron) 1 MG tablet Take one tablet by mouth at bedtime for anxiety 03/23/15  Yes Lauree Chandler, NP  aspirin EC 81 MG tablet Take 81 mg by mouth daily.   Yes Historical Provider, MD  diltiazem (TIAZAC) 240 MG 24 hr capsule Take 240 mg by mouth every morning.    Yes Historical Provider, MD  ferrous sulfate 325 (65 FE) MG tablet Take 325 mg by mouth daily.   Yes Historical Provider, MD  FLUoxetine (PROZAC) 20 MG  capsule Take 20 mg by mouth daily.   Yes Historical Provider, MD  fluticasone (FLONASE) 50 MCG/ACT nasal spray Place 2 sprays into the nose daily.   Yes Historical Provider, MD  furosemide (LASIX) 40 MG tablet Take 1 tablet (40 mg total) by mouth every morning. As needed for edema Patient taking differently: Take 40 mg by mouth daily.  11/12/14  Yes Sinda Du, MD  HYDROcodone-acetaminophen (NORCO/VICODIN) 5-325 MG per tablet Take one tablet by mouth every 6 hours as needed for pain. Max APAP 3gm/24 hours from all sources 03/23/15  Yes Lauree Chandler, NP  Multiple Vitamin (MULTIVITAMIN WITH MINERALS) TABS tablet Take 1 tablet by mouth daily.   Yes Historical Provider, MD  pantoprazole (PROTONIX) 40 MG tablet Take 1 tablet (40 mg total) by mouth 2 (two) times daily before a meal. 03/04/15  Yes Sinda Du, MD  predniSONE (DELTASONE) 10 MG tablet Take 1 tablet (10 mg total) by mouth daily with breakfast. 03/21/15  Yes Sinda Du, MD  risperiDONE (RISPERDAL) 0.5 MG tablet Take 0.5 mg by mouth at bedtime.   Yes Historical Provider, MD  tiotropium (SPIRIVA) 18 MCG inhalation capsule Place 18 mcg into inhaler and inhale every morning.   Yes Historical Provider, MD  amoxicillin (AMOXIL) 500 MG capsule Take 1 capsule (500 mg total) by mouth 3 (three) times daily. 05/15/15   April Bo, MD  cephALEXin (KEFLEX) 500 MG capsule Take 1 capsule (500 mg total) by mouth 3 (three) times daily. Patient not taking: Reported on 05/15/2015 03/21/15   Sinda Du, MD  predniSONE (DELTASONE) 50 MG tablet One tablet PO daily for 4 days Patient not taking: Reported on 03/13/2015 02/11/15   Ripley Fraise, MD   Triage Vitals: BP 121/64 mmHg  Pulse 85  Temp(Src) 98 F (36.7 C) (Oral)  Resp 16  Ht 5' 6.5" (1.689 m)  Wt 179 lb (81.194 kg)  BMI 28.46 kg/m2  SpO2 100%  LMP  (LMP Unknown)  Physical Exam  Constitutional: She is oriented to person, place, and time. She appears well-developed and well-nourished.   HENT:  Head: Normocephalic and atraumatic.  Mucous membranes are dry. Cerumen present in right ear. Left TM is slightly red and retracted.  Eyes: Conjunctivae and EOM are normal. Pupils are equal, round, and reactive to light.  Neck: Normal range of motion and phonation normal. Neck supple.  Cardiovascular: Normal rate and regular rhythm.   Pulmonary/Chest: Effort normal and breath sounds normal. She exhibits no tenderness.  Decreased air movement from right mid to lower lung. Left anterior chest wall tenderness noted.  Abdominal: Soft. She exhibits no distension. There is no tenderness. There is no guarding.  Musculoskeletal: Normal range of motion.  Neurological: She is alert and oriented to person, place, and time. She exhibits normal muscle tone.  Skin: Skin is warm and dry.  Psychiatric: She has a normal mood and affect. Her behavior is normal. Judgment and thought content normal.  Nursing note and vitals reviewed.  ED Course  Procedures (including critical care time)  DIAGNOSTIC STUDIES: Oxygen Saturation is 100% on [place], normal by my interpretation.    COORDINATION OF CARE: 10:47 AM- Discussed plans to order diagnostic CXR and lab work. Will give pt albuterol breathing treatment. Pt advised of plan for treatment and pt agrees.  11:55 AM- Discussed normal lab results with family. Pts sister expresses concern that pt still appears to be confused compared to baseline. Discussed plans to treat pt for ear infection. Will give her antibiotic medication to take TID.   Labs Review Labs Reviewed  CBC - Abnormal; Notable for the following:    WBC 15.0 (*)    RBC 2.55 (*)    Hemoglobin 7.9 (*)    HCT 26.3 (*)    MCV 103.1 (*)    RDW 16.7 (*)    All other components within normal limits  COMPREHENSIVE METABOLIC PANEL - Abnormal; Notable for the following:    Chloride 91 (*)    CO2 44 (*)    Glucose, Bld 143 (*)    BUN 43 (*)  Creatinine, Ser 1.43 (*)    Calcium 8.5 (*)     Total Protein 6.1 (*)    Albumin 3.4 (*)    AST 12 (*)    GFR calc non Af Amer 35 (*)    GFR calc Af Amer 41 (*)    All other components within normal limits   Imaging Review Dg Chest Port 1 View  05/15/2015   CLINICAL DATA:  Productive cough  EXAM: PORTABLE CHEST - 1 VIEW  COMPARISON:  05/11/2015  FINDINGS: Mild hyperinflation of the lungs. Heart is normal size. No confluent airspace opacities or effusions. No acute bony abnormality.  IMPRESSION: Mild hyperinflation/COPD.  No active disease.   Electronically Signed   By: Rolm Baptise M.D.   On: 05/15/2015 10:47     EKG Interpretation None     MDM   Final diagnoses:  Acute suppurative otitis media of left ear without spontaneous rupture of tympanic membrane, recurrence not specified    Nonspecific symptoms, with possible left otitis media. Here in the ED, there is no evidence for respiratory distress, altered mental status from baseline, serious bacterial infection or metabolic instability.  Nursing Notes Reviewed/ Care Coordinated Applicable Imaging Reviewed Interpretation of Laboratory Data incorporated into ED treatment  The patient appears reasonably screened and/or stabilized for discharge and I doubt any other medical condition or other [facility] requiring further screening, evaluation, or treatment in the ED at this time prior to discharge.  Plan: Home Medications- Amoxicillin; Home Treatments- rest, fluids; return here if the recommended treatment, does not improve the symptoms; Recommended follow up- PCP prn   I personally performed the services described in this documentation, which was scribed in my presence. The recorded information has been reviewed and is accurate.     April Bo, MD 05/15/15 [phone]

## 2015-05-15 NOTE — ED Notes (Signed)
Spoke to Roberts, the patient's nurse and she stated that someone would be down to get the pt.

## 2015-05-15 NOTE — Discharge Instructions (Signed)

## 2015-05-15 NOTE — ED Notes (Signed)
Waiting for [facility] to come and get pt.

## 2015-05-15 NOTE — ED Notes (Addendum)
EMS called out for low O2 sats. Resident of [facility]. Staff stated O2 sats in the 60's. They increased her O2 from 3 to 4 liters and EMS states O2 sat of 90's on arrival to facility. Staff also stated that pt was lethargic. EMS reports pt was A/o x 3 on arrival and was able to ambulate to the stretcher. Pt reports falling yesterday due to losing her balance and is now having left rib pain when coughing. Pt also reports productive cough, yellow in color x 1 mo. PT treated for pneumonia a month ago.

## 2015-05-15 NOTE — ED Notes (Signed)
Breathing treatment being done 

## 2015-05-17 ENCOUNTER — Other Ambulatory Visit (HOSPITAL_COMMUNITY)
Admission: RE | Admit: 2015-05-17 | Discharge: 2015-05-17 | Disposition: A | Discharge status: Home / Self Care | Payer: Medicare Other | Source: Ambulatory Visit | Attending: Pulmonary Disease | Admitting: Pulmonary Disease

## 2015-05-17 DIAGNOSIS — A419 Sepsis, unspecified organism: Secondary | ICD-10-CM | POA: Insufficient documentation

## 2015-05-18 ENCOUNTER — Encounter (HOSPITAL_COMMUNITY)
Admission: AD | Admit: 2015-05-18 | Discharge: 2015-05-18 | Disposition: A | Discharge status: Home / Self Care | Payer: Medicare Other | Source: Skilled Nursing Facility | Attending: Internal Medicine | Admitting: Internal Medicine

## 2015-05-18 DIAGNOSIS — A419 Sepsis, unspecified organism: Secondary | ICD-10-CM | POA: Insufficient documentation

## 2015-05-18 LAB — URINALYSIS, ROUTINE W REFLEX MICROSCOPIC
BILIRUBIN URINE: NEGATIVE
Glucose, UA: NEGATIVE mg/dL
Hgb urine dipstick: NEGATIVE
Ketones, ur: NEGATIVE mg/dL
Leukocytes, UA: NEGATIVE
NITRITE: NEGATIVE
PH: 6.5 (ref 5.0–8.0)
Protein, ur: NEGATIVE mg/dL
Specific Gravity, Urine: 1.01 (ref 1.005–1.030)
Urobilinogen, UA: 0.2 mg/dL (ref 0.0–1.0)

## 2015-05-18 LAB — OCCULT BLOOD X 1 CARD TO LAB, STOOL: Fecal Occult Bld: POSITIVE — AB

## 2015-05-20 LAB — URINE CULTURE

## 2015-06-02 ENCOUNTER — Other Ambulatory Visit (HOSPITAL_COMMUNITY)
Admission: RE | Admit: 2015-06-02 | Discharge: 2015-06-02 | Disposition: A | Discharge status: Home / Self Care | Payer: Medicare Other | Source: Ambulatory Visit | Attending: Pulmonary Disease | Admitting: Pulmonary Disease

## 2015-06-02 DIAGNOSIS — D649 Anemia, unspecified: Secondary | ICD-10-CM | POA: Diagnosis not present

## 2015-06-02 DIAGNOSIS — J449 Chronic obstructive pulmonary disease, unspecified: Secondary | ICD-10-CM | POA: Diagnosis not present

## 2015-06-02 LAB — CBC WITH DIFFERENTIAL/PLATELET
Basophils Absolute: 0 10*3/uL (ref 0.0–0.1)
Basophils Relative: 0 % (ref 0–1)
Eosinophils Absolute: 0.1 10*3/uL (ref 0.0–0.7)
Eosinophils Relative: 1 % (ref 0–5)
HCT: 25.1 % — ABNORMAL LOW (ref 36.0–46.0)
HEMOGLOBIN: 7.7 g/dL — AB (ref 12.0–15.0)
Lymphocytes Relative: 8 % — ABNORMAL LOW (ref 12–46)
Lymphs Abs: 0.7 10*3/uL (ref 0.7–4.0)
MCH: 30.8 pg (ref 26.0–34.0)
MCHC: 30.7 g/dL (ref 30.0–36.0)
MCV: 100.4 fL — ABNORMAL HIGH (ref 78.0–100.0)
MONO ABS: 0.9 10*3/uL (ref 0.1–1.0)
Monocytes Relative: 9 % (ref 3–12)
Neutro Abs: 7.8 10*3/uL — ABNORMAL HIGH (ref 1.7–7.7)
Neutrophils Relative %: 82 % — ABNORMAL HIGH (ref 43–77)
PLATELETS: 307 10*3/uL (ref 150–400)
RBC: 2.5 MIL/uL — AB (ref 3.87–5.11)
RDW: 16.2 % — ABNORMAL HIGH (ref 11.5–15.5)
WBC: 9.5 10*3/uL (ref 4.0–10.5)

## 2015-06-03 ENCOUNTER — Emergency Department (HOSPITAL_COMMUNITY)
Admission: EM | Admit: 2015-06-03 | Discharge: 2015-06-03 | Disposition: A | Discharge status: Home / Self Care | Payer: Medicare Other | Attending: Physician Assistant | Admitting: Physician Assistant

## 2015-06-03 ENCOUNTER — Inpatient Hospital Stay
Admission: RE | Admit: 2015-06-03 | Discharge: 2015-06-12 | Disposition: A | Discharge status: Nursing Home (Includes ICF) | Payer: Medicare Other | Source: Ambulatory Visit | Attending: Internal Medicine | Admitting: Internal Medicine

## 2015-06-03 ENCOUNTER — Non-Acute Institutional Stay: Payer: Self-pay | Admitting: Pulmonary Disease

## 2015-06-03 ENCOUNTER — Encounter: Payer: Self-pay | Admitting: Pulmonary Disease

## 2015-06-03 ENCOUNTER — Encounter (HOSPITAL_COMMUNITY): Payer: Self-pay

## 2015-06-03 DIAGNOSIS — M1990 Unspecified osteoarthritis, unspecified site: Secondary | ICD-10-CM | POA: Insufficient documentation

## 2015-06-03 DIAGNOSIS — F319 Bipolar disorder, unspecified: Secondary | ICD-10-CM | POA: Insufficient documentation

## 2015-06-03 DIAGNOSIS — Z7951 Long term (current) use of inhaled steroids: Secondary | ICD-10-CM | POA: Diagnosis not present

## 2015-06-03 DIAGNOSIS — R531 Weakness: Secondary | ICD-10-CM | POA: Diagnosis present

## 2015-06-03 DIAGNOSIS — Z7952 Long term (current) use of systemic steroids: Secondary | ICD-10-CM | POA: Diagnosis not present

## 2015-06-03 DIAGNOSIS — J441 Chronic obstructive pulmonary disease with (acute) exacerbation: Secondary | ICD-10-CM | POA: Insufficient documentation

## 2015-06-03 DIAGNOSIS — Z8669 Personal history of other diseases of the nervous system and sense organs: Secondary | ICD-10-CM | POA: Diagnosis not present

## 2015-06-03 DIAGNOSIS — Z87891 Personal history of nicotine dependence: Secondary | ICD-10-CM | POA: Diagnosis not present

## 2015-06-03 DIAGNOSIS — Z792 Long term (current) use of antibiotics: Secondary | ICD-10-CM | POA: Insufficient documentation

## 2015-06-03 DIAGNOSIS — K219 Gastro-esophageal reflux disease without esophagitis: Secondary | ICD-10-CM | POA: Diagnosis not present

## 2015-06-03 DIAGNOSIS — Z8551 Personal history of malignant neoplasm of bladder: Secondary | ICD-10-CM | POA: Diagnosis not present

## 2015-06-03 DIAGNOSIS — Z8619 Personal history of other infectious and parasitic diseases: Secondary | ICD-10-CM | POA: Diagnosis not present

## 2015-06-03 DIAGNOSIS — F419 Anxiety disorder, unspecified: Secondary | ICD-10-CM | POA: Diagnosis not present

## 2015-06-03 DIAGNOSIS — Z9889 Other specified postprocedural states: Secondary | ICD-10-CM | POA: Insufficient documentation

## 2015-06-03 DIAGNOSIS — D649 Anemia, unspecified: Secondary | ICD-10-CM | POA: Diagnosis not present

## 2015-06-03 DIAGNOSIS — Z9981 Dependence on supplemental oxygen: Secondary | ICD-10-CM | POA: Insufficient documentation

## 2015-06-03 DIAGNOSIS — Z79899 Other long term (current) drug therapy: Secondary | ICD-10-CM | POA: Insufficient documentation

## 2015-06-03 DIAGNOSIS — Z8701 Personal history of pneumonia (recurrent): Secondary | ICD-10-CM | POA: Diagnosis not present

## 2015-06-03 LAB — CBC WITH DIFFERENTIAL/PLATELET
BASOS PCT: 0 % (ref 0–1)
Basophils Absolute: 0 10*3/uL (ref 0.0–0.1)
EOS ABS: 0 10*3/uL (ref 0.0–0.7)
EOS PCT: 0 % (ref 0–5)
HEMATOCRIT: 27.4 % — AB (ref 36.0–46.0)
Hemoglobin: 8.3 g/dL — ABNORMAL LOW (ref 12.0–15.0)
Lymphocytes Relative: 2 % — ABNORMAL LOW (ref 12–46)
Lymphs Abs: 0.2 10*3/uL — ABNORMAL LOW (ref 0.7–4.0)
MCH: 30.6 pg (ref 26.0–34.0)
MCHC: 30.3 g/dL (ref 30.0–36.0)
MCV: 101.1 fL — ABNORMAL HIGH (ref 78.0–100.0)
Monocytes Absolute: 0.4 10*3/uL (ref 0.1–1.0)
Monocytes Relative: 3 % (ref 3–12)
Neutro Abs: 12.3 10*3/uL — ABNORMAL HIGH (ref 1.7–7.7)
Neutrophils Relative %: 95 % — ABNORMAL HIGH (ref 43–77)
Platelets: 350 10*3/uL (ref 150–400)
RBC: 2.71 MIL/uL — ABNORMAL LOW (ref 3.87–5.11)
RDW: 16.3 % — AB (ref 11.5–15.5)
WBC: 13 10*3/uL — ABNORMAL HIGH (ref 4.0–10.5)

## 2015-06-03 LAB — BASIC METABOLIC PANEL
Anion gap: 13 (ref 5–15)
BUN: 34 mg/dL — ABNORMAL HIGH (ref 6–20)
CO2: 39 mmol/L — ABNORMAL HIGH (ref 22–32)
Calcium: 8.9 mg/dL (ref 8.9–10.3)
Chloride: 91 mmol/L — ABNORMAL LOW (ref 101–111)
Creatinine, Ser: 1.57 mg/dL — ABNORMAL HIGH (ref 0.44–1.00)
GFR calc Af Amer: 37 mL/min — ABNORMAL LOW (ref >60)
GFR, EST NON AFRICAN AMERICAN: 32 mL/min — AB (ref >60)
Glucose, Bld: 199 mg/dL — ABNORMAL HIGH (ref 65–99)
Potassium: 4.3 mmol/L (ref 3.5–5.1)
Sodium: 143 mmol/L (ref 135–145)

## 2015-06-03 LAB — TYPE AND SCREEN
ABO/RH(D): O POS
Antibody Screen: NEGATIVE

## 2015-06-03 LAB — POC OCCULT BLOOD, ED: Fecal Occult Bld: POSITIVE — AB

## 2015-06-03 NOTE — Discharge Instructions (Signed)
Please follow with PCP and you will need outaptient GI follow up. If you have any bleeding please return immediately.   Anemia, Nonspecific Anemia is a condition in which the concentration of red blood cells or hemoglobin in the blood is below normal. Hemoglobin is a substance in red blood cells that carries oxygen to the tissues of the body. Anemia results in not enough oxygen reaching these tissues.  CAUSES  Common causes of anemia include:   Excessive bleeding. Bleeding may be internal or external. This includes excessive bleeding from periods (in women) or from the intestine.   Poor nutrition.   Chronic kidney, thyroid, and liver disease.  Bone marrow disorders that decrease red blood cell production.  Cancer and treatments for cancer.  HIV, AIDS, and their treatments.  Spleen problems that increase red blood cell destruction.  Blood disorders.  Excess destruction of red blood cells due to infection, medicines, and autoimmune disorders. SIGNS AND SYMPTOMS   Minor weakness.   Dizziness.   Headache.  Palpitations.   Shortness of breath, especially with exercise.   Paleness.  Cold sensitivity.  Indigestion.  Nausea.  Difficulty sleeping.  Difficulty concentrating. Symptoms may occur suddenly or they may develop slowly.  DIAGNOSIS  Additional blood tests are often needed. These help your health care provider determine the best treatment. Your health care provider will check your stool for blood and look for other causes of blood loss.  TREATMENT  Treatment varies depending on the cause of the anemia. Treatment can include:   Supplements of iron, vitamin Z20, or folic acid.   Hormone medicines.   A blood transfusion. This may be needed if blood loss is severe.   Hospitalization. This may be needed if there is significant continual blood loss.   Dietary changes.  Spleen removal. HOME CARE INSTRUCTIONS Keep all follow-up appointments. It often  takes many weeks to correct anemia, and having your health care provider check on your condition and your response to treatment is very important. SEEK IMMEDIATE MEDICAL CARE IF:   You develop extreme weakness, shortness of breath, or chest pain.   You become dizzy or have trouble concentrating.  You develop heavy vaginal bleeding.   You develop a rash.   You have bloody or black, tarry stools.   You faint.   You vomit up blood.   You vomit repeatedly.   You have abdominal pain.  You have a fever or persistent symptoms for more than 2-3 days.   You have a fever and your symptoms suddenly get worse.   You are dehydrated.  MAKE SURE YOU:  Understand these instructions.  Will watch your condition.  Will get help right away if you are not doing well or get worse. Document Released: 12/07/2004 Document Revised: 07/02/2013 Document Reviewed: 04/25/2013 [facility] Patient Information 2015 [place], [place]. This information is not intended to replace advice given to you by your health care provider. Make sure you discuss any questions you have with your health care provider.

## 2015-06-03 NOTE — Progress Notes (Unsigned)
Patient ID: BRAEDYN RIGGLE, female   DOB: 21-Oct-1941, 74 y.o.   MRN: [id number] This is documentation of my visit of 06/02/2015 at the skilled care facility. Ms.Alfred is a 74 year old with severe end-stage COPD. She has chronic respiratory failure. She is hypoxic. She has been back to the emergency room about 2 weeks ago but did not require admission. She has also been anemic and that needs reevaluation. She has no new complaints except she is short of breath. No chest pain.  Physical exam shows that she is awake and alert. Her chest is clear with diminished breath sounds. She is wearing oxygen. She is in a wheelchair. Her heart is regular. Her abdomen is soft. Extremities showed no edema.  She has anemia. She needs another CBC and that's been ordered. She's had multiple bouts of COPD exacerbation and that is stable. I don't think there is anything to change depending on the results of her CBC. Will continue oxygen. Continue other treatments. She may need GI workup. She has been generally weaker and having more problems for the last 6 months or so

## 2015-06-03 NOTE — ED Notes (Signed)
Pt here from the [facility] for evaluation of low hgb

## 2015-06-03 NOTE — ED Provider Notes (Signed)
CSN: [id number]     Arrival date & time 06/03/15  1424 History   First MD Initiated Contact with Patient 06/03/15 1449     Chief Complaint  Patient presents with  . Abnormal Lab     (Consider location/radiation/quality/duration/timing/severity/associated sxs/prior Treatment) HPI Comments: Patient is a 74 year old female presenting with abnormal labs from the [facility] center. Patient reports she's been occasionally dizzy and feeling weak. They got a CBC and was found her hemoglobin to be low so they sent her here for workup. Patient states she has a history of bladder cancer and is followed by urology. She is intermittently on therapy for this cancer. Patient has COPD and is on 2 L of home O2. Patient states that she had a colonoscopy 4 months ago noticed polyps. Patient denies any frank red blood per rectum. Denies any hematemesis.   Past Medical History  Diagnosis Date  . Osteoporosis   . Weakness   . Tachycardia   . Arthritis     KNEES AND HANDS  . Coarse tremors     TREMORS BOTH HANDS - PT STATES SIDE EFFECT OF HER MEDICATIONS FOR HER BREATHING  . Dizziness     sometimes  . Shortness of breath     USES OXYGEN 2 L / MIN NASAL CANNUA   24 HRS A DAY; LIVES IN [name]--CAN AMBULATE SHORT DISTANCE BUT ACTIVITIES VERY LIMITED BY SOB  . Anginal pain     "with fluttering, seen MD about it"  . History of shingles   . Emphysema   . GERD (gastroesophageal reflux disease)     sometimes  . Laryngitis     10/2013, hx of  . Pneumonia     hx of  . Bipolar 1 disorder   . Cough 06/29/14    pt states recent cough / cold - feeling better - cough now non-productive.  . Frequent urination   . COPD (chronic obstructive pulmonary disease)   . Cancer     bladder  . Anxiety   . Depression   . Edema   . Insomnia    Past Surgical History  Procedure Laterality Date  . Bladder surgery    . Cystoscopy w/ retrogrades Bilateral 06/19/2013    Procedure:  CYSTOSCOPY WITH BIOSPY AND FULGERATION, BILATERAL RETROGRADE PYELOGRAM;  Surgeon: Malka So, MD;  Location: [facility];  Service: Urology;  Laterality: Bilateral;  . Breast surgery Left     FOR TUMOR - BENIGN  . Cataract extraction Bilateral 3 years ago  . Cystoscopy w/ retrogrades Bilateral 12/25/2013    Procedure: CYSTOSCOPY WITH BILATERAL RETROGRADE WITH BIOPSY WITH FULGERATION;  Surgeon: Irine Seal, MD;  Location: [facility];  Service: Urology;  Laterality: Bilateral;  . Transurethral resection of bladder tumor Bilateral 12/25/2013    Procedure: TRANSURETHRAL RESECTION OF BLADDER TUMOR (TURBT);  Surgeon: Irine Seal, MD;  Location: [facility];  Service: Urology;  Laterality: Bilateral;  . Cystoscopy with retrograde pyelogram, ureteroscopy and stent placement Bilateral 07/02/2014    Procedure: CYSTOSCOPY WITH BILATERAL RETROGRADE PYELOGRAM;  Surgeon: Malka So, MD;  Location: [facility];  Service: Urology;  Laterality: Bilateral;  . Transurethral resection of bladder tumor N/A 07/02/2014    Procedure: TRANSURETHRAL RESECTION OF BLADDER TUMOR (TURBT);  Surgeon: Malka So, MD;  Location: [facility];  Service: Urology;  Laterality: N/A;  . Bladder surgery  2015  . Breast cyst removal    . Colonoscopy N/A 03/03/2015    RMR: Extensive colonic diverticulosis.  Multiple colonic polyps-removed as described above. No overt neoplasm seen.   . Esophagogastroduodenoscopy N/A 03/03/2015    RMR: Noncritical. Schatzki's ring status post esophageal dilation as described above. hiatal hernia; otherwise normal EGD  . Maloney dilation N/A 03/03/2015    Procedure: Venia Minks DILATION;  Surgeon: Daneil Dolin, MD;  [facility];  Service: Endoscopy;  Laterality: N/A;   Family History  Problem Relation Age of Onset  . Hypertension Mother   . Kidney failure Mother   . Cancer Father   . Asthma Father   . Heart failure Father   . Hypertension Father   . Colon cancer Father     diagnosed in his late 54, deceased from  lung issues  . Colon cancer Brother     diagnosed in his late 51s, succumbed to the disease   History  Substance Use Topics  . Smoking status: Former Smoker -- 2.00 packs/day for 40 years    Types: Cigarettes    Quit date: 11/13/2000  . Smokeless tobacco: Never Used     Comment: QUIT SMOKING YRS AGO  . Alcohol Use: No   OB History    Gravida Para Term Preterm AB TAB SAB Ectopic Multiple Living   0 0 0 0 0 0 0 0       Review of Systems  Constitutional: Positive for fatigue. Negative for activity change.  HENT: Negative for congestion and drooling.   Eyes: Negative for discharge.  Respiratory: Negative for cough and chest tightness.   Cardiovascular: Negative for chest pain.  Gastrointestinal: Negative for abdominal distention.  Genitourinary: Negative for dysuria and difficulty urinating.  Musculoskeletal: Negative for joint swelling.  Skin: Negative for rash.  Allergic/Immunologic: Negative for immunocompromised state.  Neurological: Positive for dizziness and light-headedness. Negative for speech difficulty.  Psychiatric/Behavioral: Negative for behavioral problems and agitation.  All other systems reviewed and are negative.     Allergies  Review of patient's allergies indicates no known allergies.  Home Medications   Prior to Admission medications   Medication Sig Start Date End Date Taking? Authorizing Provider  albuterol (PROVENTIL) (2.5 MG/3ML) 0.083% nebulizer solution Take 2.5 mg by nebulization 3 (three) times daily.   Yes Historical Provider, MD  alendronate (FOSAMAX) 70 MG tablet Take 70 mg by mouth every Friday. Take with a full glass of water on an empty stomach.   Yes Historical Provider, MD  ALPRAZolam Duanne Moron) 1 MG tablet Take one tablet by mouth at bedtime for anxiety 03/23/15  Yes Lauree Chandler, NP  bacitracin ointment Apply 1 application topically 2 (two) times daily.   Yes Historical Provider, MD  diltiazem (TIAZAC) 240 MG 24 hr capsule Take 240 mg  by mouth every morning.    Yes Historical Provider, MD  ferrous sulfate 325 (65 FE) MG tablet Take 325 mg by mouth daily.   Yes Historical Provider, MD  FLUoxetine (PROZAC) 20 MG capsule Take 20 mg by mouth daily.   Yes Historical Provider, MD  fluticasone (FLONASE) 50 MCG/ACT nasal spray Place 2 sprays into the nose daily.   Yes Historical Provider, MD  furosemide (LASIX) 40 MG tablet Take 1 tablet (40 mg total) by mouth every morning. As needed for edema Patient taking differently: Take 40 mg by mouth See admin instructions. Take 1 tablet by mouth once daily at 1000.  May also take an extra tablet as needed for edema. 11/12/14  Yes Sinda Du, MD  HYDROcodone-acetaminophen (NORCO/VICODIN) 5-325 MG per tablet Take one tablet by mouth  every 6 hours as needed for pain. Max APAP 3gm/24 hours from all sources 03/23/15  Yes Lauree Chandler, NP  Multiple Vitamin (MULTIVITAMIN WITH MINERALS) TABS tablet Take 1 tablet by mouth daily.   Yes Historical Provider, MD  pantoprazole (PROTONIX) 40 MG tablet Take 1 tablet (40 mg total) by mouth 2 (two) times daily before a meal. 03/04/15  Yes Sinda Du, MD  predniSONE (DELTASONE) 10 MG tablet Take 1 tablet (10 mg total) by mouth daily with breakfast. 03/21/15  Yes Sinda Du, MD  risperiDONE (RISPERDAL) 0.5 MG tablet Take 0.5 mg by mouth at bedtime.   Yes Historical Provider, MD  tiotropium (SPIRIVA) 18 MCG inhalation capsule Place 18 mcg into inhaler and inhale every morning.   Yes Historical Provider, MD  amoxicillin (AMOXIL) 500 MG capsule Take 1 capsule (500 mg total) by mouth 3 (three) times daily. Patient not taking: Reported on 06/03/2015 05/15/15   [name], MD  cephALEXin (KEFLEX) 500 MG capsule Take 1 capsule (500 mg total) by mouth 3 (three) times daily. Patient not taking: Reported on 05/15/2015 03/21/15   Sinda Du, MD  predniSONE (DELTASONE) 50 MG tablet One tablet PO daily for 4 days Patient not taking: Reported on 03/13/2015 02/11/15    Ripley Fraise, MD   BP 134/51 mmHg  Pulse 80  Temp(Src) 98.1 F (36.7 C) (Oral)  Resp 18  Ht 5' 6.5" (1.689 m)  Wt 162 lb (73.483 kg)  BMI 25.76 kg/m2  SpO2 100%  LMP  (LMP Unknown) Physical Exam  Constitutional: She is oriented to person, place, and time. She appears well-developed and well-nourished.  HENT:  Head: Normocephalic and atraumatic.  Eyes: Conjunctivae are normal. Right eye exhibits no discharge.  Neck: Neck supple.  Cardiovascular: Normal rate, regular rhythm and normal heart sounds.   No murmur heard. Pulmonary/Chest: Effort normal and breath sounds normal. She has no wheezes. She has no rales.  On 2 L.  Abdominal: Soft. She exhibits no distension. There is no tenderness.  Musculoskeletal: Normal range of motion. She exhibits no edema.  Neurological: She is oriented to person, place, and time. No cranial nerve deficit.  Skin: Skin is warm and dry. No rash noted. She is not diaphoretic.  Psychiatric: She has a normal mood and affect. Her behavior is normal.  Nursing note and vitals reviewed.   ED Course  Procedures (including critical care time) Labs Review Labs Reviewed  CBC WITH DIFFERENTIAL/PLATELET - Abnormal; Notable for the following:    WBC 13.0 (*)    RBC 2.71 (*)    Hemoglobin 8.3 (*)    HCT 27.4 (*)    MCV 101.1 (*)    RDW 16.3 (*)    Neutrophils Relative % 95 (*)    Neutro Abs 12.3 (*)    Lymphocytes Relative 2 (*)    Lymphs Abs 0.2 (*)    All other components within normal limits  POC OCCULT BLOOD, ED - Abnormal; Notable for the following:    Fecal Occult Bld POSITIVE (*)    All other components within normal limits  BASIC METABOLIC PANEL  TYPE AND SCREEN    Imaging Review No results found.   EKG Interpretation None      MDM   Final diagnoses:  None    Patient is a 74 year old female with history of bladder cancer and COPD on 2 L home oxygen presenting today with abnormally low hemoglobin. Patient sent here for the patient  to get full workup. Patient's hemoglobin today is  8.3 which is relatively stable from 7.2 and 7.0 that has been the last month. We will touch base with primary care physician. Outpatient versus inpatient workup for this anemia. Patient is a hemooccult positive. Had colonoscopy 4 months ago showing multiple polyps. Patient is on home O2, otherwise feels baseline.  Discussed with Dr. Luan Pulling, PCP agrees that outpatient workup with GI as appropriate. Will discharge patient and have her follow-up as such.  Maizie Garno Julio Alm, MD 06/03/15 [phone]

## 2015-06-07 ENCOUNTER — Encounter: Payer: Self-pay | Admitting: Nurse Practitioner

## 2015-06-07 ENCOUNTER — Ambulatory Visit (INDEPENDENT_AMBULATORY_CARE_PROVIDER_SITE_OTHER): Payer: Medicare Other | Admitting: Nurse Practitioner

## 2015-06-07 ENCOUNTER — Other Ambulatory Visit: Payer: Self-pay

## 2015-06-07 VITALS — BP 143/60 | HR 70 | Temp 97.5°F | Ht 65.0 in | Wt 160.4 lb

## 2015-06-07 DIAGNOSIS — D509 Iron deficiency anemia, unspecified: Secondary | ICD-10-CM

## 2015-06-07 DIAGNOSIS — R195 Other fecal abnormalities: Secondary | ICD-10-CM | POA: Insufficient documentation

## 2015-06-07 DIAGNOSIS — D500 Iron deficiency anemia secondary to blood loss (chronic): Secondary | ICD-10-CM | POA: Diagnosis not present

## 2015-06-07 NOTE — Assessment & Plan Note (Signed)
Patient with a history of significant iron deficiency anemia. She was hospitalized in April of this year for this where an endoscopy and colonoscopy were done. There is no etiology found for her bleeding. She missed her initial follow-up appointment. She was sent here by [facility]/PCP for worsening anemia as low as 7.7 hemoglobin. In the ER she was evaluated in her hemoglobin was 8.3. It was determined between the PCP in the ER physician that outpatient GI follow-up would be the best course of action. Today she presents with symptomatic anemia. Today I will recheck her CBC and iron studies and plan for a capsule endoscopy to complete her GI workup. We will have her return for follow-up in 4-6 weeks.  No history of colon obstruction, no current pill dysphagia, no permanent pacemaker.

## 2015-06-07 NOTE — Assessment & Plan Note (Signed)
Heme positive stool along with worsening iron deficiency anemia with a recent colonoscopy and endoscopy without etiology for bleeding found. Recommended during hospitalization to follow-up for possible capsule endoscopy to complete evaluation. Her anemia again worsening since May of this year and got as low as hemoglobin is 7.7. Seen in the emergency room last week her hemoglobin was noted to be 8.3 which is somewhat improved. At this point I will recheck her CBC and iron studies as well as plan for capsule endoscopy for further evaluation of possible etiology for chronic blood loss. We will have her return for further evaluation and supportive 6 weeks.  No history of colon instruction, current pill dysphagia, or permanent pacemaker

## 2015-06-07 NOTE — Progress Notes (Signed)
Primary Care Physician:  Alonza Bogus, MD Primary Gastroenterologist:  Dr. Oneida Alar  Chief Complaint  Patient presents with  . Rectal Bleeding    HPI:   75 year old female resident of pending [place] referred by facility and PCP for anemia. Patient was seen in the emergency room at the end of last week. She has a history of anemia and was admitted to the hospital in April 2016 where she was evaluated for iron deficiency anemia and heme positive stool. Endoscopy found Schatzki's ring status post dilation with otherwise normal, and colonoscopy of multiple colon polyps removed and extensive diverticulosis. Surgical pathology showed polyps were a mix of tubular adenoma and hyperplastic polyps. It was determined she may ultimately need outpatient capsule endoscopy. She did not follow through with hospital follow-up visit. She was seen in the emergency room on 06/03/2015 and was noted her hemoglobin had been decreasing as of late with a steady decline from 9.4/31.0 on 03/18/2015 to 7.7/25.1 on 06/02/2015. In the emergency room her hemoglobin was 8.3 and hematocrit 27.4. She was referred back to primary care with GI follow-up.  Today she states she's been having worsening dyspnea for the past couple months, although she has baseline dyspnea and is on 2 lpm O2 normally. Is having dizziness. She fell in the bathroom a couple weeks ago from dizziness/losing her balance. Has occasional chest pain. No overt or acute abdominal pain, N/V. Has a bowel movement about every 2-3 days and are hard and require straining, although they have been improved in the past month. Denies hematochezia. Has had some darkened formed stools but is on iron supplementation. Has bladder cancer and has been unable to get treatment due to immobility so she was admitted to [facility] for better management but she wasn't happy with treatment side effects and has elected to not receive treatment. Denies any other upper or lower  GI symptoms.  Past Medical History  Diagnosis Date  . Osteoporosis   . Weakness   . Tachycardia   . Arthritis     KNEES AND HANDS  . Coarse tremors     TREMORS BOTH HANDS - PT STATES SIDE EFFECT OF HER MEDICATIONS FOR HER BREATHING  . Dizziness     sometimes  . Shortness of breath     USES OXYGEN 2 L / MIN NASAL CANNUA   24 HRS A DAY; LIVES IN [name]--CAN AMBULATE SHORT DISTANCE BUT ACTIVITIES VERY LIMITED BY SOB  . Anginal pain     "with fluttering, seen MD about it"  . History of shingles   . Emphysema   . GERD (gastroesophageal reflux disease)     sometimes  . Laryngitis     10/2013, hx of  . Pneumonia     hx of  . Bipolar 1 disorder   . Cough 06/29/14    pt states recent cough / cold - feeling better - cough now non-productive.  . Frequent urination   . COPD (chronic obstructive pulmonary disease)   . Cancer     bladder  . Anxiety   . Depression   . Edema   . Insomnia     Past Surgical History  Procedure Laterality Date  . Bladder surgery    . Cystoscopy w/ retrogrades Bilateral 06/19/2013    Procedure: CYSTOSCOPY WITH BIOSPY AND FULGERATION, BILATERAL RETROGRADE PYELOGRAM;  Surgeon: Malka So, MD;  Location: [facility];  Service: Urology;  Laterality: Bilateral;  . Breast surgery Left  FOR TUMOR - BENIGN  . Cataract extraction Bilateral 3 years ago  . Cystoscopy w/ retrogrades Bilateral 12/25/2013    Procedure: CYSTOSCOPY WITH BILATERAL RETROGRADE WITH BIOPSY WITH FULGERATION;  Surgeon: Irine Seal, MD;  Location: [facility];  Service: Urology;  Laterality: Bilateral;  . Transurethral resection of bladder tumor Bilateral 12/25/2013    Procedure: TRANSURETHRAL RESECTION OF BLADDER TUMOR (TURBT);  Surgeon: Irine Seal, MD;  [facility];  Service: Urology;  Laterality: Bilateral;  . Cystoscopy with retrograde pyelogram, ureteroscopy and stent placement Bilateral 07/02/2014    Procedure: CYSTOSCOPY WITH BILATERAL RETROGRADE  PYELOGRAM;  Surgeon: Malka So, MD;  Location: [facility];  Service: Urology;  Laterality: Bilateral;  . Transurethral resection of bladder tumor N/A 07/02/2014    Procedure: TRANSURETHRAL RESECTION OF BLADDER TUMOR (TURBT);  Surgeon: Malka So, MD;  [facility]: [facility];  Service: Urology;  Laterality: N/A;  . Bladder surgery  2015  . Breast cyst removal    . Colonoscopy N/A 03/03/2015    RMR: Extensive colonic diverticulosis. Multiple colonic polyps-removed as described above. No overt neoplasm seen.   . Esophagogastroduodenoscopy N/A 03/03/2015    RMR: Noncritical. Schatzki's ring status post esophageal dilation as described above. hiatal hernia; otherwise normal EGD  . Maloney dilation N/A 03/03/2015    Procedure: Venia Minks DILATION;  Surgeon: Daneil Dolin, MD;  [facility];  Service: Endoscopy;  Laterality: N/A;    No current outpatient prescriptions on file.   No current facility-administered medications for this visit.    Allergies as of 06/07/2015  . (No Known Allergies)    Family History  Problem Relation Age of Onset  . Hypertension Mother   . Kidney failure Mother   . Cancer Father   . Asthma Father   . Heart failure Father   . Hypertension Father   . Colon cancer Father     diagnosed in his late 83, deceased from lung issues  . Colon cancer Brother     diagnosed in his late 57s, succumbed to the disease    History   Social History  . Marital Status: Single    Spouse Name: N/A  . Number of Children: N/A  . Years of Education: N/A   Occupational History  . Not on file.   Social History Main Topics  . Smoking status: Former Smoker -- 2.00 packs/day for 40 years    Types: Cigarettes    Quit date: 11/13/2000  . Smokeless tobacco: Never Used     Comment: QUIT SMOKING YRS AGO  . Alcohol Use: No  . Drug Use: No  . Sexual Activity: No   Other Topics Concern  . Not on file   Social History Narrative   ** Merged History Encounter **         Review of Systems: 10 point ROS negative except as per HPI.    Physical Exam: BP 143/60 mmHg  Pulse 70  Temp(Src) 97.5 F (36.4 C) (Oral)  Ht 5\' 5"  (1.651 m)  Wt 160 lb 6.4 oz (72.757 kg)  BMI 26.69 kg/m2  LMP  (LMP Unknown) General:   Alert and oriented. Pleasant and cooperative. Well-nourished and well-developed.  Head:  Normocephalic and atraumatic. Eyes:  Without icterus, sclera clear and conjunctiva pink.  Ears:  Normal auditory acuity. Cardiovascular:  S1, S2 present without murmurs appreciated. Normal pulses noted. Extremities without clubbing or edema. Respiratory:  Clear to auscultation bilaterally.Biateral wheezes noted. Wearing oxygen per nasal cannula. No distress.  Gastrointestinal:  +BS,  soft, non-tender and non-distended. No HSM noted. No guarding or rebound. No masses appreciated.  Rectal:  Deferred  Musculoskalatal:  Symmetrical without gross deformities. Normal posture. Skin:  Intact without significant lesions or rashes. Neurologic:  Alert and oriented x4;  grossly normal neurologically. Psych:  Alert and cooperative. Normal mood and affect. Heme/Lymph/Immune: No excessive bruising noted.    06/07/2015 11:33 AM

## 2015-06-07 NOTE — Patient Instructions (Signed)
1. Have your labs drawn as soon as you're able.  2. We will schedule your capsule study for you. 3. Return for follow-up in 6 weeks

## 2015-06-09 ENCOUNTER — Other Ambulatory Visit (HOSPITAL_COMMUNITY)
Admission: RE | Admit: 2015-06-09 | Discharge: 2015-06-09 | Disposition: A | Discharge status: Home / Self Care | Payer: Medicare Other | Source: Ambulatory Visit | Attending: Pulmonary Disease | Admitting: Pulmonary Disease

## 2015-06-09 DIAGNOSIS — A419 Sepsis, unspecified organism: Secondary | ICD-10-CM | POA: Diagnosis not present

## 2015-06-09 DIAGNOSIS — R300 Dysuria: Secondary | ICD-10-CM | POA: Insufficient documentation

## 2015-06-09 LAB — IRON AND TIBC
Iron: 15 ug/dL — ABNORMAL LOW (ref 28–170)
Saturation Ratios: 5 % — ABNORMAL LOW (ref 10.4–31.8)
TIBC: 330 ug/dL (ref 250–450)
UIBC: 315 ug/dL

## 2015-06-09 LAB — URINALYSIS, ROUTINE W REFLEX MICROSCOPIC
Bilirubin Urine: NEGATIVE
Glucose, UA: NEGATIVE mg/dL
Ketones, ur: NEGATIVE mg/dL
LEUKOCYTES UA: NEGATIVE
Nitrite: NEGATIVE
PROTEIN: NEGATIVE mg/dL
SPECIFIC GRAVITY, URINE: 1.01 (ref 1.005–1.030)
UROBILINOGEN UA: 0.2 mg/dL (ref 0.0–1.0)
pH: 5.5 (ref 5.0–8.0)

## 2015-06-09 LAB — CBC WITH DIFFERENTIAL/PLATELET
BASOS PCT: 0 % (ref 0–1)
Basophils Absolute: 0 10*3/uL (ref 0.0–0.1)
EOS PCT: 1 % (ref 0–5)
Eosinophils Absolute: 0.1 10*3/uL (ref 0.0–0.7)
HCT: 26.5 % — ABNORMAL LOW (ref 36.0–46.0)
HEMOGLOBIN: 7.9 g/dL — AB (ref 12.0–15.0)
LYMPHS PCT: 6 % — AB (ref 12–46)
Lymphs Abs: 0.7 10*3/uL (ref 0.7–4.0)
MCH: 30.3 pg (ref 26.0–34.0)
MCHC: 29.8 g/dL — ABNORMAL LOW (ref 30.0–36.0)
MCV: 101.5 fL — ABNORMAL HIGH (ref 78.0–100.0)
MONO ABS: 1.2 10*3/uL — AB (ref 0.1–1.0)
Monocytes Relative: 10 % (ref 3–12)
NEUTROS ABS: 9.6 10*3/uL — AB (ref 1.7–7.7)
Neutrophils Relative %: 83 % — ABNORMAL HIGH (ref 43–77)
PLATELETS: 282 10*3/uL (ref 150–400)
RBC: 2.61 MIL/uL — AB (ref 3.87–5.11)
RDW: 15.8 % — ABNORMAL HIGH (ref 11.5–15.5)
WBC: 11.7 10*3/uL — AB (ref 4.0–10.5)

## 2015-06-09 LAB — URINE MICROSCOPIC-ADD ON

## 2015-06-10 LAB — URINE CULTURE: Culture: NO GROWTH

## 2015-06-12 ENCOUNTER — Encounter (HOSPITAL_COMMUNITY)
Admission: RE | Admit: 2015-06-12 | Discharge: 2015-06-12 | Disposition: A | Discharge status: Home / Self Care | Payer: Medicare Other | Source: Ambulatory Visit | Attending: Pulmonary Disease | Admitting: Pulmonary Disease

## 2015-06-12 ENCOUNTER — Emergency Department (HOSPITAL_COMMUNITY)
Admission: EM | Admit: 2015-06-12 | Discharge: 2015-06-12 | Disposition: A | Discharge status: Home / Self Care | Payer: Medicare Other | Attending: Emergency Medicine | Admitting: Emergency Medicine

## 2015-06-12 ENCOUNTER — Emergency Department (HOSPITAL_COMMUNITY): Payer: Medicare Other

## 2015-06-12 ENCOUNTER — Encounter (HOSPITAL_COMMUNITY): Payer: Self-pay

## 2015-06-12 ENCOUNTER — Other Ambulatory Visit: Payer: Self-pay

## 2015-06-12 DIAGNOSIS — R410 Disorientation, unspecified: Secondary | ICD-10-CM | POA: Diagnosis not present

## 2015-06-12 DIAGNOSIS — Z79899 Other long term (current) drug therapy: Secondary | ICD-10-CM | POA: Insufficient documentation

## 2015-06-12 DIAGNOSIS — R509 Fever, unspecified: Secondary | ICD-10-CM | POA: Diagnosis not present

## 2015-06-12 DIAGNOSIS — Z87891 Personal history of nicotine dependence: Secondary | ICD-10-CM | POA: Insufficient documentation

## 2015-06-12 DIAGNOSIS — Z7951 Long term (current) use of inhaled steroids: Secondary | ICD-10-CM | POA: Diagnosis not present

## 2015-06-12 DIAGNOSIS — J159 Unspecified bacterial pneumonia: Secondary | ICD-10-CM | POA: Insufficient documentation

## 2015-06-12 DIAGNOSIS — Z8619 Personal history of other infectious and parasitic diseases: Secondary | ICD-10-CM | POA: Insufficient documentation

## 2015-06-12 DIAGNOSIS — J441 Chronic obstructive pulmonary disease with (acute) exacerbation: Secondary | ICD-10-CM | POA: Insufficient documentation

## 2015-06-12 DIAGNOSIS — Z8669 Personal history of other diseases of the nervous system and sense organs: Secondary | ICD-10-CM | POA: Insufficient documentation

## 2015-06-12 DIAGNOSIS — K219 Gastro-esophageal reflux disease without esophagitis: Secondary | ICD-10-CM | POA: Diagnosis not present

## 2015-06-12 DIAGNOSIS — M1990 Unspecified osteoarthritis, unspecified site: Secondary | ICD-10-CM | POA: Diagnosis not present

## 2015-06-12 DIAGNOSIS — Z8551 Personal history of malignant neoplasm of bladder: Secondary | ICD-10-CM | POA: Insufficient documentation

## 2015-06-12 DIAGNOSIS — R4182 Altered mental status, unspecified: Secondary | ICD-10-CM | POA: Diagnosis present

## 2015-06-12 DIAGNOSIS — I209 Angina pectoris, unspecified: Secondary | ICD-10-CM | POA: Diagnosis not present

## 2015-06-12 DIAGNOSIS — J189 Pneumonia, unspecified organism: Secondary | ICD-10-CM | POA: Diagnosis not present

## 2015-06-12 DIAGNOSIS — A419 Sepsis, unspecified organism: Secondary | ICD-10-CM | POA: Diagnosis not present

## 2015-06-12 LAB — CBC WITH DIFFERENTIAL/PLATELET
BASOS ABS: 0 10*3/uL (ref 0.0–0.1)
BASOS PCT: 0 % (ref 0–1)
Basophils Absolute: 0 10*3/uL (ref 0.0–0.1)
Basophils Relative: 0 % (ref 0–1)
EOS ABS: 0.1 10*3/uL (ref 0.0–0.7)
EOS PCT: 1 % (ref 0–5)
Eosinophils Absolute: 0.1 10*3/uL (ref 0.0–0.7)
Eosinophils Relative: 1 % (ref 0–5)
HCT: 26.6 % — ABNORMAL LOW (ref 36.0–46.0)
HCT: 28.5 % — ABNORMAL LOW (ref 36.0–46.0)
Hemoglobin: 7.8 g/dL — ABNORMAL LOW (ref 12.0–15.0)
Hemoglobin: 8.2 g/dL — ABNORMAL LOW (ref 12.0–15.0)
LYMPHS ABS: 0.6 10*3/uL — AB (ref 0.7–4.0)
Lymphocytes Relative: 4 % — ABNORMAL LOW (ref 12–46)
Lymphocytes Relative: 5 % — ABNORMAL LOW (ref 12–46)
Lymphs Abs: 0.7 10*3/uL (ref 0.7–4.0)
MCH: 29.9 pg (ref 26.0–34.0)
MCH: 29.5 pg (ref 26.0–34.0)
MCHC: 29.3 g/dL — ABNORMAL LOW (ref 30.0–36.0)
MCHC: 28.8 g/dL — AB (ref 30.0–36.0)
MCV: 101.9 fL — ABNORMAL HIGH (ref 78.0–100.0)
MCV: 102.5 fL — ABNORMAL HIGH (ref 78.0–100.0)
Monocytes Absolute: 1.7 10*3/uL — ABNORMAL HIGH (ref 0.1–1.0)
Monocytes Absolute: 1.5 10*3/uL — ABNORMAL HIGH (ref 0.1–1.0)
Monocytes Relative: 11 % (ref 3–12)
Monocytes Relative: 10 % (ref 3–12)
NEUTROS PCT: 84 % — AB (ref 43–77)
NEUTROS PCT: 84 % — AB (ref 43–77)
Neutro Abs: 12.9 10*3/uL — ABNORMAL HIGH (ref 1.7–7.7)
Neutro Abs: 12 10*3/uL — ABNORMAL HIGH (ref 1.7–7.7)
PLATELETS: 327 10*3/uL (ref 150–400)
PLATELETS: 355 10*3/uL (ref 150–400)
RBC: 2.61 MIL/uL — AB (ref 3.87–5.11)
RBC: 2.78 MIL/uL — AB (ref 3.87–5.11)
RDW: 15.1 % (ref 11.5–15.5)
RDW: 15.1 % (ref 11.5–15.5)
WBC: 15.2 10*3/uL — AB (ref 4.0–10.5)
WBC: 14.2 10*3/uL — ABNORMAL HIGH (ref 4.0–10.5)

## 2015-06-12 LAB — URINALYSIS, ROUTINE W REFLEX MICROSCOPIC
Bilirubin Urine: NEGATIVE
Glucose, UA: NEGATIVE mg/dL
KETONES UR: NEGATIVE mg/dL
Leukocytes, UA: NEGATIVE
Nitrite: NEGATIVE
Protein, ur: 30 mg/dL — AB
Specific Gravity, Urine: 1.015 (ref 1.005–1.030)
UROBILINOGEN UA: 0.2 mg/dL (ref 0.0–1.0)
pH: 6 (ref 5.0–8.0)

## 2015-06-12 LAB — BASIC METABOLIC PANEL
Anion gap: 9 (ref 5–15)
BUN: 30 mg/dL — ABNORMAL HIGH (ref 6–20)
CALCIUM: 9.3 mg/dL (ref 8.9–10.3)
CO2: 42 mmol/L — AB (ref 22–32)
Chloride: 93 mmol/L — ABNORMAL LOW (ref 101–111)
Creatinine, Ser: 1.16 mg/dL — ABNORMAL HIGH (ref 0.44–1.00)
GFR calc Af Amer: 53 mL/min — ABNORMAL LOW (ref >60)
GFR calc non Af Amer: 46 mL/min — ABNORMAL LOW (ref >60)
Glucose, Bld: 116 mg/dL — ABNORMAL HIGH (ref 65–99)
Potassium: 4.4 mmol/L (ref 3.5–5.1)
Sodium: 144 mmol/L (ref 135–145)

## 2015-06-12 LAB — COMPREHENSIVE METABOLIC PANEL
ALBUMIN: 3.1 g/dL — AB (ref 3.5–5.0)
ALT: 12 U/L — ABNORMAL LOW (ref 14–54)
AST: 10 U/L — AB (ref 15–41)
Alkaline Phosphatase: 76 U/L (ref 38–126)
Anion gap: 8 (ref 5–15)
BUN: 30 mg/dL — ABNORMAL HIGH (ref 6–20)
CALCIUM: 8.9 mg/dL (ref 8.9–10.3)
CO2: 42 mmol/L — ABNORMAL HIGH (ref 22–32)
CREATININE: 1.15 mg/dL — AB (ref 0.44–1.00)
Chloride: 93 mmol/L — ABNORMAL LOW (ref 101–111)
GFR calc Af Amer: 53 mL/min — ABNORMAL LOW (ref >60)
GFR calc non Af Amer: 46 mL/min — ABNORMAL LOW (ref >60)
Glucose, Bld: 134 mg/dL — ABNORMAL HIGH (ref 65–99)
Potassium: 4.3 mmol/L (ref 3.5–5.1)
SODIUM: 143 mmol/L (ref 135–145)
Total Bilirubin: 0.4 mg/dL (ref 0.3–1.2)
Total Protein: 6.1 g/dL — ABNORMAL LOW (ref 6.5–8.1)

## 2015-06-12 LAB — URINE MICROSCOPIC-ADD ON

## 2015-06-12 MED ORDER — VANCOMYCIN HCL IN DEXTROSE 1-5 GM/200ML-% IV SOLN
1000.0000 mg | INTRAVENOUS | Status: DC
  Administered 2015-06-12: 1000 mg via INTRAVENOUS
  Filled 2015-06-12: qty 200

## 2015-06-12 MED ORDER — VANCOMYCIN HCL 1000 MG IV SOLR: 1000.0000 mg | INTRAVENOUS | Status: DC

## 2015-06-12 MED ORDER — VANCOMYCIN HCL IN DEXTROSE 750-5 MG/150ML-% IV SOLN
750.0000 mg | Freq: Two times a day (BID) | INTRAVENOUS | Status: DC
  Filled 2015-06-12 (×4): qty 150

## 2015-06-12 MED ORDER — DEXTROSE 5 % IV SOLN: 1.0000 g | INTRAVENOUS | Status: DC

## 2015-06-12 MED ORDER — DEXTROSE 5 % IV SOLN
2.0000 g | Freq: Once | INTRAVENOUS | Status: AC
  Administered 2015-06-12: 2 g via INTRAVENOUS
  Filled 2015-06-12: qty 2

## 2015-06-12 MED ORDER — VANCOMYCIN HCL IN DEXTROSE 1-5 GM/200ML-% IV SOLN: 1000.0000 mg | Freq: Once | INTRAVENOUS | Status: DC

## 2015-06-12 NOTE — ED Provider Notes (Signed)
CSN: [id number]     Arrival date & time    History  This chart was scribed for Tanna Furry, MD by Hansel Feinstein, ED Scribe. This patient was seen in room APA18/APA18 and the patient's care was started at 11:01 AM.     Chief Complaint  Patient presents with  . Altered Mental Status   The history is provided by the patient. No language interpreter was used.    LEVEL 5 CAVEAT: HPI and ROS limited due to condition of pt.    HPI Comments: YUMNA EBERS is a 74 y.o. female BIBA with Hx of arthritis, bladder cancer, tremors, GERD, COPD (2 LO2/min at home) who presents to the Emergency Department complaining of altered mental status onset today. Pt states associated leg tremors. Pt is from a care facility. Per EMS, hemoglobin was in the 7's and her white count was 15. She is alert to her location. Pt was seen in the ED on 06/03/15 for abnormal lab results and on 05/15/15 for cough and SOB. She denies other symptoms.   Past Medical History  Diagnosis Date  . Osteoporosis   . Weakness   . Tachycardia   . Arthritis     KNEES AND HANDS  . Coarse tremors     TREMORS BOTH HANDS - PT STATES SIDE EFFECT OF HER MEDICATIONS FOR HER BREATHING  . Dizziness     sometimes  . Shortness of breath     USES OXYGEN 2 L / MIN NASAL CANNUA   24 HRS A DAY; LIVES IN [name]--CAN AMBULATE SHORT DISTANCE BUT ACTIVITIES VERY LIMITED BY SOB  . Anginal pain     "with fluttering, seen MD about it"  . History of shingles   . Emphysema   . GERD (gastroesophageal reflux disease)     sometimes  . Laryngitis     10/2013, hx of  . Pneumonia     hx of  . Bipolar 1 disorder   . Cough 06/29/14    pt states recent cough / cold - feeling better - cough now non-productive.  . Frequent urination   . COPD (chronic obstructive pulmonary disease)   . Cancer     bladder  . Anxiety   . Depression   . Edema   . Insomnia    Past Surgical History  Procedure Laterality Date  . Bladder  surgery    . Cystoscopy w/ retrogrades Bilateral 06/19/2013    Procedure: CYSTOSCOPY WITH BIOSPY AND FULGERATION, BILATERAL RETROGRADE PYELOGRAM;  Surgeon: Malka So, MD;  Location: [facility];  Service: Urology;  Laterality: Bilateral;  . Breast surgery Left     FOR TUMOR - BENIGN  . Cataract extraction Bilateral 3 years ago  . Cystoscopy w/ retrogrades Bilateral 12/25/2013    Procedure: CYSTOSCOPY WITH BILATERAL RETROGRADE WITH BIOPSY WITH FULGERATION;  Surgeon: Irine Seal, MD;  Location: [facility];  Service: Urology;  Laterality: Bilateral;  . Transurethral resection of bladder tumor Bilateral 12/25/2013    Procedure: TRANSURETHRAL RESECTION OF BLADDER TUMOR (TURBT);  Surgeon: Irine Seal, MD;  Location: [facility];  Service: Urology;  Laterality: Bilateral;  . Cystoscopy with retrograde pyelogram, ureteroscopy and stent placement Bilateral 07/02/2014    Procedure: CYSTOSCOPY WITH BILATERAL RETROGRADE PYELOGRAM;  Surgeon: Malka So, MD;  Location: [facility];  Service: Urology;  Laterality: Bilateral;  . Transurethral resection of bladder tumor N/A 07/02/2014    Procedure: TRANSURETHRAL RESECTION OF BLADDER TUMOR (TURBT);  Surgeon: Malka So,  MD;  Location: [facility];  Service: Urology;  Laterality: N/A;  . Bladder surgery  2015  . Breast cyst removal    . Colonoscopy N/A 03/03/2015    RMR: Extensive colonic diverticulosis. Multiple colonic polyps-removed as described above. No overt neoplasm seen.   . Esophagogastroduodenoscopy N/A 03/03/2015    RMR: Noncritical. Schatzki's ring status post esophageal dilation as described above. hiatal hernia; otherwise normal EGD  . Maloney dilation N/A 03/03/2015    Procedure: Venia Minks DILATION;  Surgeon: Daneil Dolin, MD;  Location: [facility];  Service: Endoscopy;  Laterality: N/A;   Family History  Problem Relation Age of Onset  . Hypertension Mother   . Kidney failure Mother   . Cancer Father   . Asthma Father   . Heart failure Father   . Hypertension  Father   . Colon cancer Father     diagnosed in his late 74, deceased from lung issues  . Colon cancer Brother     diagnosed in his late 57s, succumbed to the disease   History  Substance Use Topics  . Smoking status: Former Smoker -- 2.00 packs/day for 40 years    Types: Cigarettes    Quit date: 11/13/2000  . Smokeless tobacco: Never Used     Comment: QUIT SMOKING YRS AGO  . Alcohol Use: No   OB History    Gravida Para Term Preterm AB TAB SAB Ectopic Multiple Living   0 0 0 0 0 0 0 0       Review of Systems  Unable to perform ROS Musculoskeletal:       Leg tremors  Psychiatric/Behavioral: Positive for confusion.   Allergies  Review of patient's allergies indicates no known allergies.  Home Medications   Prior to Admission medications   Medication Sig Start Date End Date Taking? Authorizing Provider  albuterol (PROVENTIL) (2.5 MG/3ML) 0.083% nebulizer solution Take 2.5 mg by nebulization 3 (three) times daily.   Yes Historical Provider, MD  alendronate (FOSAMAX) 70 MG tablet Take 70 mg by mouth every Friday. Take with a full glass of water on an empty stomach.   Yes Historical Provider, MD  ALPRAZolam Duanne Moron) 1 MG tablet Take one tablet by mouth at bedtime for anxiety 03/23/15  Yes Lauree Chandler, NP  diltiazem (TIAZAC) 240 MG 24 hr capsule Take 240 mg by mouth every morning.    Yes Historical Provider, MD  ferrous sulfate 325 (65 FE) MG tablet Take 325 mg by mouth daily.   Yes Historical Provider, MD  FLUoxetine (PROZAC) 20 MG capsule Take 20 mg by mouth daily.   Yes Historical Provider, MD  fluticasone (FLONASE) 50 MCG/ACT nasal spray Place 2 sprays into the nose daily.   Yes Historical Provider, MD  furosemide (LASIX) 40 MG tablet Take 1 tablet (40 mg total) by mouth every morning. As needed for edema Patient taking differently: Take 40 mg by mouth See admin instructions. Take 1 tablet by mouth once daily at 1000.  May also take an extra tablet as needed for edema.  11/12/14  Yes Sinda Du, MD  HYDROcodone-acetaminophen (NORCO/VICODIN) 5-325 MG per tablet Take one tablet by mouth every 6 hours as needed for pain. Max APAP 3gm/24 hours from all sources 03/23/15  Yes Lauree Chandler, NP  Multiple Vitamin (MULTIVITAMIN WITH MINERALS) TABS tablet Take 1 tablet by mouth daily.   Yes Historical Provider, MD  pantoprazole (PROTONIX) 40 MG tablet Take 1 tablet (40 mg total) by mouth 2 (two) times daily  before a meal. 03/04/15  Yes Sinda Du, MD  predniSONE (DELTASONE) 10 MG tablet Take 1 tablet (10 mg total) by mouth daily with breakfast. 03/21/15  Yes Sinda Du, MD  risperiDONE (RISPERDAL) 0.5 MG tablet Take 0.5 mg by mouth at bedtime.   Yes Historical Provider, MD  tiotropium (SPIRIVA) 18 MCG inhalation capsule Place 18 mcg into inhaler and inhale every morning.   Yes Historical Provider, MD   LMP  (LMP Unknown) Physical Exam  Constitutional: She appears well-developed and well-nourished. No distress.  HENT:  Head: Normocephalic.  Mouth/Throat: Mucous membranes are dry.  Eyes: Pupils are equal, round, and reactive to light. No scleral icterus.  Pale conjunctiva   Neck: Normal range of motion. Neck supple. No thyromegaly present.  Cardiovascular: Normal rate and regular rhythm.  Exam reveals no gallop and no friction rub.   No murmur heard. Pulmonary/Chest: Effort normal. No respiratory distress. She has no wheezes. She has rhonchi ( bilateral). She has no rales.  Abdominal: Soft. Bowel sounds are normal. She exhibits no distension. There is no tenderness. There is no rebound.  Musculoskeletal: Normal range of motion.  Neurological: She is alert.  Answers simple questions. Not oriented.   Skin: Skin is warm and dry. No rash noted.  Psychiatric: She has a normal mood and affect. Her behavior is normal.  Nursing note and vitals reviewed.  ED Course  Procedures (including critical care time) DIAGNOSTIC STUDIES: Oxygen Saturation is 93% on [place] 2L  O2/min, adequate by my interpretation.    COORDINATION OF CARE: 11:06 AM Discussed treatment plan with pt at bedside and pt agreed to plan.   Labs Review Labs Reviewed - No data to display  Imaging Review No results found.   EKG Interpretation None     MDM   Final diagnoses:  None   Angiocath insertion Performed by: Lolita Patella  Consent: Verbal consent obtained. Risks and benefits: risks, benefits and alternatives were discussed Time out: Immediately prior to procedure a "time out" was called to verify the correct patient, procedure, equipment, support staff and site/side marked as required.  Preparation: Patient was prepped and draped in the usual sterile fashion.  Vein Location: Lt AC  +Ultrasound Guided  Gauge: 20  Normal blood return and flush without difficulty Patient tolerance: Patient tolerated the procedure well with no immediate complications.  Chest X ray shows subtle opacity. Given IV vancomycin Maxipime. Patient's WBC is high at 14. Hemoglobin is above laced lightheaded 8.2. Creatinine at baseline 1.16. We have spoken with staff at [facility]. They are comfortable administering patient IV antibiotics at their facility.  Vision has no increased worker breathing. Saturating 100% on HER-2 liters nasal cannula. No surgical criteria. Appropriate for [facility] treatment.    I personally performed the services described in this documentation, which was scribed in my presence. The recorded information has been reviewed and is accurate.   Tanna Furry, MD 06/12/15 [phone]

## 2015-06-12 NOTE — ED Notes (Signed)
[facility] arrived at bedside to transport patient back to [facility].

## 2015-06-12 NOTE — Discharge Instructions (Signed)
Check oxygen saturations daily.  Return to ER if worsening at [facility].  Pneumonia, Adult Pneumonia is an infection of the lungs. It may be caused by a germ (virus or bacteria). Some types of pneumonia can spread easily from person to person. This can happen when you cough or sneeze. HOME CARE  Only take medicine as told by your doctor.  Take your medicine (antibiotics) as told. Finish it even if you start to feel better.  Do not smoke.  You may use a vaporizer or humidifier in your room. This can help loosen thick spit (mucus).  Sleep so you are almost sitting up (semi-upright). This helps reduce coughing.  Rest. A shot (vaccine) can help prevent pneumonia. Shots are often advised for:  People over 32 years old.  Patients on chemotherapy.  People with long-term (chronic) lung problems.  People with immune system problems. GET HELP RIGHT AWAY IF:   You are getting worse.  You cannot control your cough, and you are losing sleep.  You cough up blood.  Your pain gets worse, even with medicine.  You have a fever.  Any of your problems are getting worse, not better.  You have shortness of breath or chest pain. MAKE SURE YOU:   Understand these instructions.  Will watch your condition.  Will get help right away if you are not doing well or get worse. Document Released: 04/17/2008 Document Revised: 01/22/2012 Document Reviewed: 01/20/2011 [facility] Patient Information 2015 [place], [place]. This information is not intended to replace advice given to you by your health care provider. Make sure you discuss any questions you have with your health care provider.

## 2015-06-12 NOTE — ED Notes (Signed)
[place] notified patient is ready for discharge.

## 2015-06-12 NOTE — ED Notes (Signed)
Report given and d/c instructions reviewed with Asa Lente at [facility]. [phone]

## 2015-06-12 NOTE — ED Notes (Signed)
Spoke with Asa Lente, pt's nurse at the [facility]. She states they can give pt her antibiotic at the facility.

## 2015-06-12 NOTE — ED Notes (Signed)
Attempted to call [facility] several times. No answer

## 2015-06-12 NOTE — Progress Notes (Signed)
ANTIBIOTIC CONSULT NOTE - INITIAL  Pharmacy Consult for Vancomycin Indication: pneumonia  No Known Allergies  Patient Measurements:   Adjusted Body Weight:   Vital Signs: Temp: 99 F (37.2 C) (07/30 1130) Temp Source: Rectal (07/30 1130) BP: 138/58 mmHg (07/30 1200) Pulse Rate: 90 (07/30 1200) Intake/Output from previous day:   Intake/Output from this shift:    Labs:  Recent Labs  06/12/15 0800 06/12/15 1130  WBC 15.2* 14.2*  HGB 7.8* 8.2*  PLT 327 355  CREATININE 1.15* 1.16*   Estimated Creatinine Clearance: 43.2 mL/min (by C-G formula based on Cr of 1.16). No results for input(s): VANCOTROUGH, VANCOPEAK, VANCORANDOM, GENTTROUGH, GENTPEAK, GENTRANDOM, TOBRATROUGH, TOBRAPEAK, TOBRARND, AMIKACINPEAK, AMIKACINTROU, AMIKACIN in the last 72 hours.   Microbiology: Recent Results (from the past 720 hour(s))  Culture, Urine     Status: None   Collection Time: 05/18/15 11:00 AM  Result Value Ref Range Status   Specimen Description URINE, CLEAN CATCH  Final   Special Requests URINE, CLEAN CATCH  Final   Culture   Final    MULTIPLE SPECIES PRESENT, SUGGEST RECOLLECTION IF CLINICALLY INDICATED Performed at [facility]    Report Status 05/20/2015 FINAL  Final  Culture, Urine     Status: None   Collection Time: 06/09/15 11:15 AM  Result Value Ref Range Status   Specimen Description URINE, CLEAN CATCH  Final   Special Requests NONE  Final   Culture   Final    NO GROWTH 1 DAY Performed at [facility]    Report Status 06/10/2015 FINAL  Final    Medical History: Past Medical History  Diagnosis Date  . Osteoporosis   . Weakness   . Tachycardia   . Arthritis     KNEES AND HANDS  . Coarse tremors     TREMORS BOTH HANDS - PT STATES SIDE EFFECT OF HER MEDICATIONS FOR HER BREATHING  . Dizziness     sometimes  . Shortness of breath     USES OXYGEN 2 L / MIN NASAL CANNUA   24 HRS A DAY; LIVES IN [name]--CAN  AMBULATE SHORT DISTANCE BUT ACTIVITIES VERY LIMITED BY SOB  . Anginal pain     "with fluttering, seen MD about it"  . History of shingles   . Emphysema   . GERD (gastroesophageal reflux disease)     sometimes  . Laryngitis     10/2013, hx of  . Pneumonia     hx of  . Bipolar 1 disorder   . Cough 06/29/14    pt states recent cough / cold - feeling better - cough now non-productive.  . Frequent urination   . COPD (chronic obstructive pulmonary disease)   . Cancer     bladder  . Anxiety   . Depression   . Edema   . Insomnia     Medications:   (Not in a hospital admission) Assessment: 74 yo female from [facility] for evaluation of fever, altered mental status, elevated white count. Vancomycin per pharmacy for pneumonia  Goal of Therapy:  Vancomycin trough level 15-20 mcg/ml  Plan:  Vancomycin 1 GM IV every 24 hours Vancomycin trough at steady state Monitor renal function Labs per protocol   Abner Greenspan, Ken Bonn Bennett 06/12/2015,12:53 PM

## 2015-06-12 NOTE — ED Notes (Signed)
Pt here from [facility] for evaluation of fever, altered mental status, elevated white count and low hemoglobin

## 2015-06-14 ENCOUNTER — Telehealth: Payer: Self-pay

## 2015-06-14 ENCOUNTER — Ambulatory Visit (HOSPITAL_COMMUNITY)
Admission: RE | Admit: 2015-06-14 | Discharge: 2015-06-14 | Disposition: A | Discharge status: Home / Self Care | Payer: Medicare Other | Source: Ambulatory Visit | Attending: Pulmonary Disease | Admitting: Pulmonary Disease

## 2015-06-14 DIAGNOSIS — I872 Venous insufficiency (chronic) (peripheral): Secondary | ICD-10-CM | POA: Diagnosis not present

## 2015-06-14 LAB — URINE CULTURE: Culture: NO GROWTH

## 2015-06-14 LAB — CBG MONITORING, ED: Glucose-Capillary: 112 mg/dL — ABNORMAL HIGH (ref 65–99)

## 2015-06-14 MED ORDER — SODIUM CHLORIDE 0.9 % IJ SOLN: 10.0000 mL | Freq: Two times a day (BID) | INTRAMUSCULAR | Status: DC

## 2015-06-14 MED ORDER — SODIUM CHLORIDE 0.9 % IJ SOLN: 10.0000 mL | INTRAMUSCULAR | Status: DC | PRN

## 2015-06-14 NOTE — Progress Notes (Signed)
Peripherally Inserted Central Catheter/Midline Placement  The IV Nurse has discussed with the patient and/or persons authorized to consent for the patient, the purpose of this procedure and the potential benefits and risks involved with this procedure.  The benefits include less needle sticks, lab draws from the catheter and patient may be discharged home with the catheter.  Risks include, but not limited to, infection, bleeding, blood clot (thrombus formation), and puncture of an artery; nerve damage and irregular heat beat.  Alternatives to this procedure were also discussed.  PICC/Midline Placement Documentation  PICC / Midline Single Lumen 30/13/14 PICC Right Basilic 44 cm 0 cm (Active)  Indication for Insertion or Continuance of Line Administration of hyperosmolar/irritating solutions (i.e. TPN, Vancomycin, etc.);Prolonged intravenous therapies;Limited venous access - need for IV therapy >5 days (PICC only) 06/14/2015 12:15 PM  Exposed Catheter (cm) 0 cm 06/14/2015 12:15 PM  Site Assessment Clean;Dry;Intact 06/14/2015 12:15 PM  Line Status Flushed;Saline locked;Capped (central line);Blood return noted 06/14/2015 12:15 PM  Dressing Type Transparent 06/14/2015 12:15 PM  Dressing Status Clean;Dry;Intact 06/14/2015 12:15 PM  Dressing Change Due 06/21/15 06/14/2015 12:15 PM    picc ok to use per ECG technnology  Roselind Messier 06/14/2015, 12:25 PM

## 2015-06-14 NOTE — Discharge Instructions (Signed)
PICC Home Guide A peripherally inserted central catheter (PICC) is a long, thin, flexible tube that is inserted into a vein in the upper arm. It is a form of intravenous (IV) access. It is considered to be a "central" line because the tip of the PICC ends in a large vein in your chest. This large vein is called the superior vena cava (SVC). The PICC tip ends in the SVC because there is a lot of blood flow in the SVC. This allows medicines and IV fluids to be quickly distributed throughout the body. The PICC is inserted using a sterile technique by a specially trained nurse or physician. After the PICC is inserted, a chest X-ray exam is done to be sure it is in the correct place.  A PICC may be placed for different reasons, such as:  To give medicines and liquid nutrition that can only be given through a central line. Examples are:  Certain antibiotic treatments.  Chemotherapy.  Total parenteral nutrition (TPN).  To take frequent blood samples.  To give IV fluids and blood products.  If there is difficulty placing a peripheral intravenous (PIV) catheter. If taken care of properly, a PICC can remain in place for several months. A PICC can also allow a person to go home from the hospital early. Medicine and PICC care can be managed at home by a family member or home health care team. WHAT PROBLEMS CAN HAPPEN WHEN I HAVE A PICC? Problems with a PICC can occasionally occur. These may include the following:  A blood clot (thrombus) forming in or at the tip of the PICC. This can cause the PICC to become clogged. A clot-dissolving medicine called tissue plasminogen activator (tPA) can be given through the PICC to help break up the clot.  Inflammation of the vein (phlebitis) in which the PICC is placed. Signs of inflammation may include redness, pain at the insertion site, red streaks, or being able to feel a "cord" in the vein where the PICC is located.  Infection in the PICC or at the insertion  site. Signs of infection may include fever, chills, redness, swelling, or pus drainage from the PICC insertion site.  PICC movement (malposition). The PICC tip may move from its original position due to excessive physical activity, forceful coughing, sneezing, or vomiting.  A break or cut in the PICC. It is important to not use scissors near the PICC.  Nerve or tendon irritation or injury during PICC insertion. WHAT SHOULD I KEEP IN MIND ABOUT ACTIVITIES WHEN I HAVE A PICC?  You may bend your arm and move it freely. If your PICC is near or at the bend of your elbow, avoid activity with repeated motion at the elbow.  Rest at home for the remainder of the day following PICC line insertion.  Avoid lifting heavy objects as instructed by your health care provider.  Avoid using a crutch with the arm on the same side as your PICC. You may need to use a walker. WHAT SHOULD I KNOW ABOUT MY PICC DRESSING?  Keep your PICC bandage (dressing) clean and dry to prevent infection.  Ask your health care provider when you may shower. Ask your health care provider to teach you how to wrap the PICC when you do take a shower.  Change the PICC dressing as instructed by your health care provider.  Change your PICC dressing if it becomes loose or wet. WHAT SHOULD I KNOW ABOUT PICC CARE?  Check the PICC insertion site   daily for leakage, redness, swelling, or pain.  Do not take a bath, swim, or use hot tubs when you have a PICC. Cover PICC line with clear plastic wrap and tape to keep it dry while showering.  Flush the PICC as directed by your health care provider. Let your health care provider know right away if the PICC is difficult to flush or does not flush. Do not use force to flush the PICC.  Do not use a syringe that is less than 10 mL to flush the PICC.  Never pull or tug on the PICC.  Avoid blood pressure checks on the arm with the PICC.  Keep your PICC identification card with you at all  times.  Do not take the PICC out yourself. Only a trained clinical professional should remove the PICC. SEEK IMMEDIATE MEDICAL CARE IF:  Your PICC is accidentally pulled all the way out. If this happens, cover the insertion site with a bandage or gauze dressing. Do not throw the PICC away. Your health care provider will need to inspect it.  Your PICC was tugged or pulled and has partially come out. Do not  push the PICC back in.  There is any type of drainage, redness, or swelling where the PICC enters the skin.  You cannot flush the PICC, it is difficult to flush, or the PICC leaks around the insertion site when it is flushed.  You hear a "flushing" sound when the PICC is flushed.  You have pain, discomfort, or numbness in your arm, shoulder, or jaw on the same side as the PICC.  You feel your heart "racing" or skipping beats.  You notice a hole or tear in the PICC.  You develop chills or a fever. MAKE SURE YOU:   Understand these instructions.  Will watch your condition.  Will get help right away if you are not doing well or get worse. Document Released: 05/06/2003 Document Revised: 03/16/2014 Document Reviewed: 07/07/2013 [employer] Patient Information 2015 [employer]. This information is not intended to replace advice given to you by your health care provider. Make sure you discuss any questions you have with your health care provider.  

## 2015-06-14 NOTE — Telephone Encounter (Signed)
Per [employer] pt Freda Munro is approved. PA # [id number]. Valid from 06/16/2015-09/14/2015.

## 2015-06-15 NOTE — Progress Notes (Signed)
CC'ED TO PCP 

## 2015-06-16 ENCOUNTER — Other Ambulatory Visit (HOSPITAL_COMMUNITY)
Admission: RE | Admit: 2015-06-16 | Discharge: 2015-06-16 | Disposition: A | Discharge status: Home / Self Care | Payer: Medicare Other | Source: Skilled Nursing Facility | Attending: Pulmonary Disease | Admitting: Pulmonary Disease

## 2015-06-16 ENCOUNTER — Encounter (HOSPITAL_COMMUNITY)
Admission: RE | Disposition: A | Discharge status: Home / Self Care | Payer: Self-pay | Source: Ambulatory Visit | Attending: Gastroenterology

## 2015-06-16 ENCOUNTER — Ambulatory Visit (HOSPITAL_COMMUNITY)
Admission: RE | Admit: 2015-06-16 | Discharge: 2015-06-16 | Disposition: A | Discharge status: Home / Self Care | Payer: Medicare Other | Source: Ambulatory Visit | Attending: Gastroenterology | Admitting: Gastroenterology

## 2015-06-16 DIAGNOSIS — Q2733 Arteriovenous malformation of digestive system vessel: Secondary | ICD-10-CM | POA: Insufficient documentation

## 2015-06-16 DIAGNOSIS — D509 Iron deficiency anemia, unspecified: Secondary | ICD-10-CM | POA: Diagnosis not present

## 2015-06-16 HISTORY — PX: GIVENS CAPSULE STUDY: SHX5432

## 2015-06-16 LAB — BASIC METABOLIC PANEL
ANION GAP: 9 (ref 5–15)
BUN: 25 mg/dL — ABNORMAL HIGH (ref 6–20)
CALCIUM: 8.5 mg/dL — AB (ref 8.9–10.3)
CO2: 41 mmol/L — AB (ref 22–32)
Chloride: 94 mmol/L — ABNORMAL LOW (ref 101–111)
Creatinine, Ser: 1.13 mg/dL — ABNORMAL HIGH (ref 0.44–1.00)
GFR calc Af Amer: 54 mL/min — ABNORMAL LOW (ref >60)
GFR, EST NON AFRICAN AMERICAN: 47 mL/min — AB (ref >60)
Glucose, Bld: 95 mg/dL (ref 65–99)
Potassium: 4.2 mmol/L (ref 3.5–5.1)
Sodium: 144 mmol/L (ref 135–145)

## 2015-06-16 LAB — VANCOMYCIN, TROUGH: VANCOMYCIN TR: 19 ug/mL (ref 10.0–20.0)

## 2015-06-16 SURGERY — IMAGING PROCEDURE, GI TRACT, INTRALUMINAL, VIA CAPSULE

## 2015-06-16 NOTE — Progress Notes (Signed)
Called report to [place] at [facility]. Verbalized understanding. No questions for me at this time. Told to notify Jeralyn Bennett, RN for questions or if blue light on box stops blinking. Patient swallowed pill cam with no complications.

## 2015-06-17 ENCOUNTER — Encounter (HOSPITAL_COMMUNITY): Payer: Self-pay | Admitting: Gastroenterology

## 2015-06-17 ENCOUNTER — Inpatient Hospital Stay
Admission: RE | Admit: 2015-06-17 | Discharge: 2015-10-10 | Disposition: A | Discharge status: Nursing Home (Includes ICF) | Payer: Medicare Other | Source: Ambulatory Visit | Attending: Pulmonary Disease | Admitting: Pulmonary Disease

## 2015-06-17 DIAGNOSIS — J189 Pneumonia, unspecified organism: Principal | ICD-10-CM

## 2015-06-17 DIAGNOSIS — R059 Cough, unspecified: Secondary | ICD-10-CM

## 2015-06-17 DIAGNOSIS — R05 Cough: Secondary | ICD-10-CM

## 2015-06-21 ENCOUNTER — Encounter (HOSPITAL_COMMUNITY)
Admission: RE | Admit: 2015-06-21 | Discharge: 2015-06-21 | Disposition: A | Discharge status: Home / Self Care | Payer: Medicare Other | Source: Skilled Nursing Facility | Attending: Internal Medicine | Admitting: Internal Medicine

## 2015-06-21 DIAGNOSIS — J440 Chronic obstructive pulmonary disease with acute lower respiratory infection: Secondary | ICD-10-CM | POA: Diagnosis not present

## 2015-06-21 LAB — BASIC METABOLIC PANEL
Anion gap: 8 (ref 5–15)
BUN: 23 mg/dL — AB (ref 6–20)
CO2: 43 mmol/L — ABNORMAL HIGH (ref 22–32)
Calcium: 8.8 mg/dL — ABNORMAL LOW (ref 8.9–10.3)
Chloride: 94 mmol/L — ABNORMAL LOW (ref 101–111)
Creatinine, Ser: 1.43 mg/dL — ABNORMAL HIGH (ref 0.44–1.00)
GFR calc non Af Amer: 35 mL/min — ABNORMAL LOW (ref >60)
GFR, EST AFRICAN AMERICAN: 41 mL/min — AB (ref >60)
Glucose, Bld: 102 mg/dL — ABNORMAL HIGH (ref 65–99)
POTASSIUM: 4 mmol/L (ref 3.5–5.1)
SODIUM: 145 mmol/L (ref 135–145)

## 2015-06-21 LAB — VANCOMYCIN, TROUGH: VANCOMYCIN TR: 20 ug/mL (ref 10.0–20.0)

## 2015-06-24 ENCOUNTER — Telehealth: Payer: Self-pay | Admitting: Gastroenterology

## 2015-06-24 NOTE — Progress Notes (Signed)
REVIEWED-NO ADDITIONAL RECOMMENDATIONS. 

## 2015-06-24 NOTE — Telephone Encounter (Signed)
PLEASE CALL PT. 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 HER LOW BLOOD COUNT IS ALSO DUE TO HER KIDNEY DISEASE.  Plan: 1. SHE NEEDS IVFE AND A HEMATOLOGY CONSULT TO MANAGE HER ANEMIA LONG TERM. 2. AVOID ASA/NSAIDS FOR NEXT 4 MOS 3. OPV IN 4 MOS E30 FEDA

## 2015-06-24 NOTE — Procedures (Addendum)
  RESULTS: LIMITED views of gastric mucosa due to retained contents. No blood in the stomach. 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 No masses, OR ULCERS SEEN. NO OLD BLOOD OR FRESH BLOOD SEEN. LIMITED VIEWS OF THE COLON DUE TO RETAINED CONTENTS.  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 IVFE/HEMATOLOGY CONSULT 2. AVOID ASA/NSAIDS FOR NEXT 4 MOS 3. OPV IN 4 MOS

## 2015-06-25 ENCOUNTER — Other Ambulatory Visit: Payer: Self-pay

## 2015-06-25 ENCOUNTER — Other Ambulatory Visit (HOSPITAL_COMMUNITY)
Admission: RE | Admit: 2015-06-25 | Discharge: 2015-06-25 | Disposition: A | Discharge status: Home / Self Care | Payer: Medicare Other | Source: Skilled Nursing Facility | Attending: Internal Medicine | Admitting: Internal Medicine

## 2015-06-25 DIAGNOSIS — J440 Chronic obstructive pulmonary disease with acute lower respiratory infection: Secondary | ICD-10-CM | POA: Diagnosis not present

## 2015-06-25 DIAGNOSIS — D649 Anemia, unspecified: Secondary | ICD-10-CM

## 2015-06-25 LAB — VANCOMYCIN, TROUGH: Vancomycin Tr: 7 ug/mL — ABNORMAL LOW (ref 10.0–20.0)

## 2015-06-25 NOTE — Telephone Encounter (Signed)
Referral made to Hematology.

## 2015-06-25 NOTE — Telephone Encounter (Signed)
PATIENT SCHEDULED  °

## 2015-06-27 DIAGNOSIS — D649 Anemia, unspecified: Secondary | ICD-10-CM | POA: Diagnosis not present

## 2015-06-27 DIAGNOSIS — J449 Chronic obstructive pulmonary disease, unspecified: Secondary | ICD-10-CM | POA: Diagnosis not present

## 2015-06-28 ENCOUNTER — Encounter (HOSPITAL_COMMUNITY)
Admission: RE | Admit: 2015-06-28 | Discharge: 2015-06-28 | Disposition: A | Discharge status: Home / Self Care | Payer: Medicare Other | Source: Skilled Nursing Facility | Attending: Internal Medicine | Admitting: Internal Medicine

## 2015-06-28 DIAGNOSIS — J440 Chronic obstructive pulmonary disease with acute lower respiratory infection: Secondary | ICD-10-CM | POA: Diagnosis not present

## 2015-06-28 LAB — CBC WITH DIFFERENTIAL/PLATELET
BASOS PCT: 1 % (ref 0–1)
Basophils Absolute: 0.1 10*3/uL (ref 0.0–0.1)
Eosinophils Absolute: 0.4 10*3/uL (ref 0.0–0.7)
Eosinophils Relative: 4 % (ref 0–5)
HCT: 24.6 % — ABNORMAL LOW (ref 36.0–46.0)
Hemoglobin: 7.2 g/dL — ABNORMAL LOW (ref 12.0–15.0)
LYMPHS PCT: 9 % — AB (ref 12–46)
Lymphs Abs: 0.8 10*3/uL (ref 0.7–4.0)
MCH: 29.1 pg (ref 26.0–34.0)
MCHC: 29.3 g/dL — ABNORMAL LOW (ref 30.0–36.0)
MCV: 99.6 fL (ref 78.0–100.0)
MONO ABS: 1 10*3/uL (ref 0.1–1.0)
Monocytes Relative: 11 % (ref 3–12)
Neutro Abs: 6.7 10*3/uL (ref 1.7–7.7)
Neutrophils Relative %: 75 % (ref 43–77)
PLATELETS: 307 10*3/uL (ref 150–400)
RBC: 2.47 MIL/uL — ABNORMAL LOW (ref 3.87–5.11)
RDW: 16.4 % — ABNORMAL HIGH (ref 11.5–15.5)
WBC: 8.9 10*3/uL (ref 4.0–10.5)

## 2015-06-28 NOTE — Telephone Encounter (Signed)
REVIEWED-NO ADDITIONAL RECOMMENDATIONS. 

## 2015-06-28 NOTE — Telephone Encounter (Signed)
Called and spoke to pt's nurse, Hilda Blades, at the [facility] and informed of the plan.  Faxing the info also to her at [phone]. She is aware we will let her know about the IV iron and the referral has been made to Hematology.  Faxing the orders to Dr. Oneida Alar to complete.

## 2015-06-29 ENCOUNTER — Encounter (HOSPITAL_COMMUNITY)
Admission: RE | Admit: 2015-06-29 | Discharge: 2015-06-29 | Disposition: A | Discharge status: Home / Self Care | Payer: Medicare Other | Source: Ambulatory Visit | Attending: Gastroenterology | Admitting: Gastroenterology

## 2015-06-29 DIAGNOSIS — D509 Iron deficiency anemia, unspecified: Secondary | ICD-10-CM | POA: Insufficient documentation

## 2015-06-29 MED ORDER — SODIUM CHLORIDE 0.9 % IV SOLN
510.0000 mg | Freq: Once | INTRAVENOUS | Status: AC
  Administered 2015-06-29: 510 mg via INTRAVENOUS
  Filled 2015-06-29: qty 17

## 2015-06-29 MED ORDER — SODIUM CHLORIDE 0.9 % IV SOLN
INTRAVENOUS | Status: DC
Start: 2015-06-29 — End: 2015-06-30
  Administered 2015-06-29: 200 mL via INTRAVENOUS

## 2015-07-07 ENCOUNTER — Ambulatory Visit (HOSPITAL_COMMUNITY): Payer: Medicare Other | Admitting: Hematology & Oncology

## 2015-07-21 ENCOUNTER — Ambulatory Visit (HOSPITAL_COMMUNITY): Payer: Medicare Other | Attending: Internal Medicine

## 2015-07-21 DIAGNOSIS — I7090 Unspecified atherosclerosis: Secondary | ICD-10-CM | POA: Insufficient documentation

## 2015-07-21 DIAGNOSIS — J449 Chronic obstructive pulmonary disease, unspecified: Secondary | ICD-10-CM | POA: Diagnosis not present

## 2015-07-21 DIAGNOSIS — R079 Chest pain, unspecified: Secondary | ICD-10-CM | POA: Insufficient documentation

## 2015-07-21 DIAGNOSIS — R05 Cough: Secondary | ICD-10-CM | POA: Insufficient documentation

## 2015-07-22 ENCOUNTER — Ambulatory Visit (INDEPENDENT_AMBULATORY_CARE_PROVIDER_SITE_OTHER): Payer: Medicare Other | Admitting: Nurse Practitioner

## 2015-07-22 ENCOUNTER — Encounter: Payer: Self-pay | Admitting: Nurse Practitioner

## 2015-07-22 VITALS — BP 148/67 | HR 85 | Temp 97.6°F | Ht 66.0 in | Wt 161.0 lb

## 2015-07-22 DIAGNOSIS — R195 Other fecal abnormalities: Secondary | ICD-10-CM

## 2015-07-22 DIAGNOSIS — D500 Iron deficiency anemia secondary to blood loss (chronic): Secondary | ICD-10-CM | POA: Diagnosis not present

## 2015-07-22 NOTE — Patient Instructions (Signed)
1. Follow-up with [facility] hematology (Dr. Whitney Muse) for them to continue to monitor and manage her anemia. 2. Follow-up in 6 months so we can check on you.

## 2015-07-22 NOTE — Assessment & Plan Note (Signed)
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 This along with kidney disease is contributing to her chronic anemia. Per the patient and her daughter no noted blood in stool currently or recently. Follow-up with hematology as scheduled. Return for follow-up in 6 months.

## 2015-07-22 NOTE — Progress Notes (Signed)
Referring Provider: Sinda Du, MD Primary Care Physician:  Alonza Bogus, MD Primary GI:  Dr. Oneida Alar  Chief Complaint  Patient presents with  . Anemia    HPI:   74 year old female presents for follow-up on heme positive stool and iron deficiency anemia. Colonoscopy and endoscopy did not find any source of her bleeding. Capsule endoscopy found AVMs. It was deemed that her small bowel AVMs conjunction with her chronic illnesses are multifactorial causes of her iron deficiency anemia. She has been referred to hematology for monitoring of her blood counts and IV iron/transfusions as needed. She appears to have had her first IV iron infusion approximately 2-3 weeks ago. Last hemoglobin and hematocrit from CBC done 06/28/2015 by PCP shows hemoglobin 7.2 and hematocrit 24.6. This is a bit lower from her baseline however she does have an appointment with Dr. Whitney Muse scheduled at hematology next week.  Today she states she's feeling better since her iron infusion. She is also eating better. She did have some difficulty breathing and was evaluated at [facility], has resolved and breathing back to baseline. Remains oxygen dependent. Denies hematochezia and melena, abdominal pain. Appetitie is improved. Denies chest pain, dyspnea, dizziness, lightheadedness, syncope, near syncope. Denies any other upper or lower GI symptoms.  Past Medical History  Diagnosis Date  . Osteoporosis   . Weakness   . Tachycardia   . Arthritis     KNEES AND HANDS  . Coarse tremors     TREMORS BOTH HANDS - PT STATES SIDE EFFECT OF HER MEDICATIONS FOR HER BREATHING  . Dizziness     sometimes  . Shortness of breath     USES OXYGEN 2 L / MIN NASAL CANNUA   24 HRS A DAY; LIVES IN [name]--CAN AMBULATE SHORT DISTANCE BUT ACTIVITIES VERY LIMITED BY SOB  . Anginal pain     "with fluttering, seen MD about it"  . History of shingles   . Emphysema   . GERD (gastroesophageal  reflux disease)     sometimes  . Laryngitis     10/2013, hx of  . Pneumonia     hx of  . Bipolar 1 disorder   . Cough 06/29/14    pt states recent cough / cold - feeling better - cough now non-productive.  . Frequent urination   . COPD (chronic obstructive pulmonary disease)   . Cancer     bladder  . Anxiety   . Depression   . Edema   . Insomnia     Past Surgical History  Procedure Laterality Date  . Bladder surgery    . Cystoscopy w/ retrogrades Bilateral 06/19/2013    Procedure: CYSTOSCOPY WITH BIOSPY AND FULGERATION, BILATERAL RETROGRADE PYELOGRAM;  Surgeon: Malka So, MD;  [facility]: [facility];  Service: Urology;  Laterality: Bilateral;  . Breast surgery Left     FOR TUMOR - BENIGN  . Cataract extraction Bilateral 3 years ago  . Cystoscopy w/ retrogrades Bilateral 12/25/2013    Procedure: CYSTOSCOPY WITH BILATERAL RETROGRADE WITH BIOPSY WITH FULGERATION;  Surgeon: Irine Seal, MD;  [facility];  Service: Urology;  Laterality: Bilateral;  . Transurethral resection of bladder tumor Bilateral 12/25/2013    Procedure: TRANSURETHRAL RESECTION OF BLADDER TUMOR (TURBT);  Surgeon: Irine Seal, MD;  [facility];  Service: Urology;  Laterality: Bilateral;  . Cystoscopy with retrograde pyelogram, ureteroscopy and stent placement Bilateral 07/02/2014    Procedure: CYSTOSCOPY WITH BILATERAL RETROGRADE PYELOGRAM;  Surgeon: Marshall Cork  Jeffie Pollock, MD;  [facility];  Service: Urology;  Laterality: Bilateral;  . Transurethral resection of bladder tumor N/A 07/02/2014    Procedure: TRANSURETHRAL RESECTION OF BLADDER TUMOR (TURBT);  Surgeon: Malka So, MD;  [facility];  Service: Urology;  Laterality: N/A;  . Bladder surgery  2015  . Breast cyst removal    . Colonoscopy N/A 03/03/2015    RMR: Extensive colonic diverticulosis. Multiple colonic polyps-removed as described above. No overt neoplasm seen.   . Esophagogastroduodenoscopy N/A 03/03/2015    RMR: Noncritical. Schatzki's ring status  post esophageal dilation as described above. hiatal hernia; otherwise normal EGD  . Maloney dilation N/A 03/03/2015    Procedure: Venia Minks DILATION;  Surgeon: Daneil Dolin, MD;  [facility];  Service: Endoscopy;  Laterality: N/A;  . Givens capsule study N/A 06/16/2015    Procedure: GIVENS CAPSULE STUDY;  Surgeon: Danie Binder, MD;  [facility];  Service: Endoscopy;  Laterality: N/A;  pt to arrive at 8:00 for 8:30 appt    No current outpatient prescriptions on file.   No current facility-administered medications for this visit.    Allergies as of 07/22/2015  . (No Known Allergies)    Family History  Problem Relation Age of Onset  . Hypertension Mother   . Kidney failure Mother   . Cancer Father   . Asthma Father   . Heart failure Father   . Hypertension Father   . Colon cancer Father     diagnosed in his late 69, deceased from lung issues  . Colon cancer Brother     diagnosed in his late 73s, succumbed to the disease    Social History   Social History  . Marital Status: Single    Spouse Name: N/A  . Number of Children: N/A  . Years of Education: N/A   Social History Main Topics  . Smoking status: Former Smoker -- 2.00 packs/day for 40 years    Types: Cigarettes    Quit date: 11/13/2000  . Smokeless tobacco: Never Used     Comment: QUIT SMOKING YRS AGO  . Alcohol Use: No  . Drug Use: No  . Sexual Activity: No   Other Topics Concern  . None   Social History Narrative   ** Merged History Encounter **        Review of Systems: General: Negative for anorexia, weight loss, fever, chills, fatigue, weakness. CV: Negative for chest pain, angina, palpitations, dyspnea on exertion, peripheral edema.  Respiratory: Negative for dyspnea at rest, dyspnea on exertion, cough, sputum, wheezing.  GI: See history of present illness. Endo: Negative for unusual weight change.  Heme: Negative for bruising or bleeding. Allergy: Negative for rash or  hives.   Physical Exam: BP 148/67 mmHg  Pulse 85  Temp(Src) 97.6 F (36.4 C) (Oral)  Ht 5\' 6"  (1.676 m)  Wt 161 lb (73.029 kg)  BMI 26.00 kg/m2  LMP  (LMP Unknown) General:   Alert and oriented. Pleasant and cooperative. Well-nourished and well-developed.  Head:  Normocephalic and atraumatic. Eyes:  Without icterus, sclera clear and conjunctiva pink.  Cardiovascular:  S1, S2 present without murmurs appreciated. Normal pulses noted. Extremities without clubbing or edema. Respiratory:  Clear to auscultation bilaterally. No wheezes, rales, or rhonchi. No distress.  Gastrointestinal:  +BS, soft, non-tender and non-distended. No HSM noted. No guarding or rebound. No masses appreciated.  Rectal:  Deferred  Neurologic:  Alert and oriented x4;  grossly normal neurologically. Psych:  Alert  and cooperative. Normal mood and affect. Heme/Lymph/Immune: No excessive bruising noted.    07/22/2015 11:03 AM

## 2015-07-22 NOTE — Progress Notes (Signed)
CC'ED TO PCP 

## 2015-07-22 NOTE — Assessment & Plan Note (Signed)
Patient with iron deficiency anemia. After extensive GI workup is deemed her anemia is multifactorial likely from kidney disease as well as small bowel AVMs. She is been referred to hematology and will be followed by them to manage her anemia in the long-term including IV iron and transfusions as necessary. We will have her follow-up in 6 months

## 2015-07-24 DIAGNOSIS — J449 Chronic obstructive pulmonary disease, unspecified: Secondary | ICD-10-CM | POA: Diagnosis not present

## 2015-07-24 DIAGNOSIS — D649 Anemia, unspecified: Secondary | ICD-10-CM | POA: Diagnosis not present

## 2015-07-25 NOTE — Progress Notes (Signed)
She says she's about the same. She says she still has shortness of breath. No other new complaints. She has been to the GI office and it's felt that her anemia is related to chronic disease plus AVMs in the small intestine. She has been receiving IV iron. This is documentation of my visit at the skilled care facility on 07/24/2015  Exam shows that she is awake and alert. She is sitting upright. She is wearing oxygen. She does look mildly short of breath which is about her baseline. Her chest shows some rhonchi bilaterally. Her heart is regular without gallop. Her abdomen is soft with no masses and she doesn't have any edema  She has severe end-stage COPD with chronic hypoxic respiratory failure. She continues to have symptoms from that but I think she's basically about at baseline. She may be having more trouble because of her anemia. Her anemia seems to be related to AV malformations and she is receiving IV iron. She still has a PICC line in place and I'm going to leave and therefore now because she has frequently required treatment for COPD exacerbations with IV medications. At baseline she has anxiety and depression/bipolar disease and that seems stable  Continue current treatments: No change in meds

## 2015-07-28 ENCOUNTER — Encounter (HOSPITAL_COMMUNITY): Payer: Medicare Other | Attending: Pulmonary Disease | Admitting: Hematology & Oncology

## 2015-07-28 VITALS — BP 144/67 | HR 78 | Temp 98.2°F | Resp 20 | Ht 66.0 in | Wt 161.0 lb

## 2015-07-28 DIAGNOSIS — Q2730 Arteriovenous malformation, site unspecified: Secondary | ICD-10-CM

## 2015-07-28 DIAGNOSIS — Z808 Family history of malignant neoplasm of other organs or systems: Secondary | ICD-10-CM

## 2015-07-28 DIAGNOSIS — D649 Anemia, unspecified: Secondary | ICD-10-CM | POA: Diagnosis not present

## 2015-07-28 DIAGNOSIS — E611 Iron deficiency: Secondary | ICD-10-CM | POA: Diagnosis not present

## 2015-07-28 DIAGNOSIS — Z87891 Personal history of nicotine dependence: Secondary | ICD-10-CM

## 2015-07-28 DIAGNOSIS — N183 Chronic kidney disease, stage 3 (moderate): Secondary | ICD-10-CM | POA: Diagnosis not present

## 2015-07-28 DIAGNOSIS — Z803 Family history of malignant neoplasm of breast: Secondary | ICD-10-CM

## 2015-07-28 DIAGNOSIS — Z800 Family history of malignant neoplasm of digestive organs: Secondary | ICD-10-CM

## 2015-07-28 DIAGNOSIS — I10 Essential (primary) hypertension: Secondary | ICD-10-CM | POA: Diagnosis not present

## 2015-07-28 LAB — FERRITIN: Ferritin: 82 ng/mL (ref 11–307)

## 2015-07-28 LAB — COMPREHENSIVE METABOLIC PANEL
ALBUMIN: 3.4 g/dL — AB (ref 3.5–5.0)
ALK PHOS: 82 U/L (ref 38–126)
ALT: 24 U/L (ref 14–54)
ANION GAP: 7 (ref 5–15)
AST: 20 U/L (ref 15–41)
BUN: 38 mg/dL — AB (ref 6–20)
CALCIUM: 8.5 mg/dL — AB (ref 8.9–10.3)
CO2: 44 mmol/L — AB (ref 22–32)
Chloride: 89 mmol/L — ABNORMAL LOW (ref 101–111)
Creatinine, Ser: 1.56 mg/dL — ABNORMAL HIGH (ref 0.44–1.00)
GFR calc Af Amer: 37 mL/min — ABNORMAL LOW (ref >60)
GFR calc non Af Amer: 32 mL/min — ABNORMAL LOW (ref >60)
GLUCOSE: 156 mg/dL — AB (ref 65–99)
Potassium: 4.7 mmol/L (ref 3.5–5.1)
SODIUM: 140 mmol/L (ref 135–145)
Total Bilirubin: 0.3 mg/dL (ref 0.3–1.2)
Total Protein: 6.5 g/dL (ref 6.5–8.1)

## 2015-07-28 LAB — CBC WITH DIFFERENTIAL/PLATELET
BASOS ABS: 0 10*3/uL (ref 0.0–0.1)
Basophils Relative: 0 %
EOS ABS: 0.1 10*3/uL (ref 0.0–0.7)
Eosinophils Relative: 1 %
HEMATOCRIT: 30.9 % — AB (ref 36.0–46.0)
HEMOGLOBIN: 9.6 g/dL — AB (ref 12.0–15.0)
LYMPHS PCT: 2 %
Lymphs Abs: 0.2 10*3/uL — ABNORMAL LOW (ref 0.7–4.0)
MCH: 31.1 pg (ref 26.0–34.0)
MCHC: 31.1 g/dL (ref 30.0–36.0)
MCV: 100 fL (ref 78.0–100.0)
MONOS PCT: 4 %
Monocytes Absolute: 0.4 10*3/uL (ref 0.1–1.0)
NEUTROS PCT: 93 %
Neutro Abs: 9.4 10*3/uL — ABNORMAL HIGH (ref 1.7–7.7)
Platelets: 313 10*3/uL (ref 150–400)
RBC: 3.09 MIL/uL — AB (ref 3.87–5.11)
RDW: 15.8 % — ABNORMAL HIGH (ref 11.5–15.5)
WBC: 10.1 10*3/uL (ref 4.0–10.5)

## 2015-07-28 LAB — FOLATE: FOLATE: 19.3 ng/mL (ref >5.9)

## 2015-07-28 LAB — RETICULOCYTES
RBC.: 3.09 MIL/uL — AB (ref 3.87–5.11)
RETIC COUNT ABSOLUTE: 46.4 10*3/uL (ref 19.0–186.0)
RETIC CT PCT: 1.5 % (ref 0.4–3.1)

## 2015-07-28 LAB — LACTATE DEHYDROGENASE: LDH: 151 U/L (ref 98–192)

## 2015-07-28 LAB — SEDIMENTATION RATE: Sed Rate: 55 mm/hr — ABNORMAL HIGH (ref 0–22)

## 2015-07-28 LAB — VITAMIN B12: Vitamin B-12: 437 pg/mL (ref 180–914)

## 2015-07-28 MED ORDER — HEPARIN SOD (PORK) LOCK FLUSH 100 UNIT/ML IV SOLN
INTRAVENOUS | Status: AC
  Filled 2015-07-28: qty 5
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
## 2015-07-28 NOTE — Patient Instructions (Signed)
[place] at [facility] Discharge Instructions  RECOMMENDATIONS MADE BY THE CONSULTANT AND ANY TEST RESULTS WILL BE SENT TO YOUR REFERRING PHYSICIAN.  Lab work today as ordered. We will call you with any abnormal test results. Return as scheduled.  Thank you for choosing [place] at [facility] to provide your oncology and hematology care.  To afford each patient quality time with our provider, please arrive at least 15 minutes before your scheduled appointment time.    You need to re-schedule your appointment should you arrive 10 or more minutes late.  We strive to give you quality time with our providers, and arriving late affects you and other patients whose appointments are after yours.  Also, if you no show three or more times for appointments you may be dismissed from the clinic at the providers discretion.     Again, thank you for choosing [facility].  Our hope is that these requests will decrease the amount of time that you wait before being seen by our physicians.       _____________________________________________________________  Should you have questions after your visit to [facility], please contact our office at [phone] between the hours of 8:30 a.m. and 4:30 p.m.  Voicemails left after 4:30 p.m. will not be returned until the following business day.  For prescription refill requests, have your pharmacy contact our office.

## 2015-07-29 ENCOUNTER — Encounter (HOSPITAL_COMMUNITY): Payer: Self-pay | Admitting: Hematology & Oncology

## 2015-07-29 LAB — PROTEIN ELECTROPHORESIS, SERUM
A/G RATIO SPE: 1 (ref 0.7–1.7)
ALBUMIN ELP: 3.1 g/dL (ref 2.9–4.4)
ALPHA-2-GLOBULIN: 0.9 g/dL (ref 0.4–1.0)
Alpha-1-Globulin: 0.3 g/dL (ref 0.0–0.4)
Beta Globulin: 1.1 g/dL (ref 0.7–1.3)
GLOBULIN, TOTAL: 3.1 g/dL (ref 2.2–3.9)
Gamma Globulin: 0.8 g/dL (ref 0.4–1.8)
TOTAL PROTEIN ELP: 6.2 g/dL (ref 6.0–8.5)

## 2015-07-29 LAB — IMMUNOFIXATION ELECTROPHORESIS
IgA: 231 mg/dL (ref 64–422)
IgG (Immunoglobin G), Serum: 711 mg/dL (ref 700–1600)
IgM, Serum: 105 mg/dL (ref 26–217)
Total Protein ELP: 6 g/dL (ref 6.0–8.5)

## 2015-07-29 LAB — ERYTHROPOIETIN: ERYTHROPOIETIN: 14.3 m[IU]/mL (ref 2.6–18.5)

## 2015-07-29 LAB — IGG, IGA, IGM
IGA: 227 mg/dL (ref 64–422)
IGG (IMMUNOGLOBIN G), SERUM: 710 mg/dL (ref 700–1600)
IGM, SERUM: 105 mg/dL (ref 26–217)

## 2015-07-29 LAB — HAPTOGLOBIN: HAPTOGLOBIN: 267 mg/dL — AB (ref 34–200)

## 2015-08-02 ENCOUNTER — Telehealth (HOSPITAL_COMMUNITY): Payer: Self-pay | Admitting: Hematology & Oncology

## 2015-08-02 NOTE — Telephone Encounter (Signed)
CALLED PT TO VERIFY MEDICAID INS. VOICE MAIL INDICATED THAT WE MAY HAVE AN INCORRECT PH# FOR PT. WILL VERIFY WITH PT WHEN SHE COMES IN ON 9/21  [facility] [phone]

## 2015-08-04 ENCOUNTER — Ambulatory Visit (HOSPITAL_COMMUNITY): Payer: Medicare Other

## 2015-08-09 ENCOUNTER — Encounter (HOSPITAL_COMMUNITY): Payer: Self-pay

## 2015-08-13 ENCOUNTER — Inpatient Hospital Stay (HOSPITAL_COMMUNITY): Payer: Medicare Other | Attending: Pulmonary Disease

## 2015-08-13 ENCOUNTER — Encounter (HOSPITAL_COMMUNITY): Payer: Self-pay | Admitting: Hematology & Oncology

## 2015-08-13 ENCOUNTER — Encounter (HOSPITAL_BASED_OUTPATIENT_CLINIC_OR_DEPARTMENT_OTHER): Payer: Medicare Other | Admitting: Hematology & Oncology

## 2015-08-13 VITALS — BP 143/51 | HR 85 | Temp 98.4°F | Resp 22 | Wt 156.6 lb

## 2015-08-13 DIAGNOSIS — D649 Anemia, unspecified: Secondary | ICD-10-CM | POA: Diagnosis not present

## 2015-08-13 DIAGNOSIS — N183 Chronic kidney disease, stage 3 (moderate): Secondary | ICD-10-CM

## 2015-08-13 DIAGNOSIS — E611 Iron deficiency: Secondary | ICD-10-CM

## 2015-08-13 DIAGNOSIS — Q2730 Arteriovenous malformation, site unspecified: Secondary | ICD-10-CM | POA: Diagnosis not present

## 2015-08-13 DIAGNOSIS — R05 Cough: Secondary | ICD-10-CM | POA: Diagnosis not present

## 2015-08-13 NOTE — Progress Notes (Signed)
[facility] NOTE  Patient Care Team: Sinda Du, MD as PCP - General (Pulmonary Disease) Sinda Du, MD (Internal Medicine) Danie Binder, MD as Consulting Physician (Gastroenterology)  CHIEF COMPLAINTS/PURPOSE OF CONSULTATION:   Hemoccult positive stool; iron deficiency anemia; small bowel AVM's Colonoscopy and EGD on 03/03/2015 Capsule endoscopy on 06/07/2015 Iron saturation of 5% on 06/09/2015 Serum iron of 15 ?g/dL on 06/07/2015 History of bladder cancer  HISTORY OF PRESENTING ILLNESS:  April Pearson 74 y.o. female is here on a referral from her GI doctor due to hemoccult positive stool, iron deficiency anemia, and small bowel AVM's.  The patient is here today with her sister. She looks good today.  Due to complications, she did not receive her most recent iron treatment, so her appointment today is very brief.   MEDICAL HISTORY:  Past Medical History  Diagnosis Date  . Osteoporosis   . Weakness   . Tachycardia   . Arthritis     KNEES AND HANDS  . Coarse tremors     TREMORS BOTH HANDS - PT STATES SIDE EFFECT OF HER MEDICATIONS FOR HER BREATHING  . Dizziness     sometimes  . Shortness of breath     USES OXYGEN 2 L / MIN NASAL CANNUA   24 HRS A DAY; LIVES IN [name]--CAN AMBULATE SHORT DISTANCE BUT ACTIVITIES VERY LIMITED BY SOB  . Anginal pain     "with fluttering, seen MD about it"  . History of shingles   . Emphysema   . GERD (gastroesophageal reflux disease)     sometimes  . Laryngitis     10/2013, hx of  . Pneumonia     hx of  . Bipolar 1 disorder   . Cough 06/29/14    pt states recent cough / cold - feeling better - cough now non-productive.  . Frequent urination   . COPD (chronic obstructive pulmonary disease)   . Cancer     bladder  . Anxiety   . Depression   . Edema   . Insomnia     SURGICAL HISTORY: Past Surgical History  Procedure Laterality Date  .  Bladder surgery    . Cystoscopy w/ retrogrades Bilateral 06/19/2013    Procedure: CYSTOSCOPY WITH BIOSPY AND FULGERATION, BILATERAL RETROGRADE PYELOGRAM;  Surgeon: Malka So, MD;  Location: [facility];  Service: Urology;  Laterality: Bilateral;  . Breast surgery Left     FOR TUMOR - BENIGN  . Cataract extraction Bilateral 3 years ago  . Cystoscopy w/ retrogrades Bilateral 12/25/2013    Procedure: CYSTOSCOPY WITH BILATERAL RETROGRADE WITH BIOPSY WITH FULGERATION;  Surgeon: Irine Seal, MD;  Location: [facility];  Service: Urology;  Laterality: Bilateral;  . Transurethral resection of bladder tumor Bilateral 12/25/2013    Procedure: TRANSURETHRAL RESECTION OF BLADDER TUMOR (TURBT);  Surgeon: Irine Seal, MD;  Location: [facility];  Service: Urology;  Laterality: Bilateral;  . Cystoscopy with retrograde pyelogram, ureteroscopy and stent placement Bilateral 07/02/2014    Procedure: CYSTOSCOPY WITH BILATERAL RETROGRADE PYELOGRAM;  Surgeon: Malka So, MD;  Location: [facility];  Service: Urology;  Laterality: Bilateral;  . Transurethral resection of bladder tumor N/A 07/02/2014    Procedure: TRANSURETHRAL RESECTION OF BLADDER TUMOR (TURBT);  Surgeon: Malka So, MD;  Location: [facility];  Service: Urology;  Laterality: N/A;  . Bladder surgery  2015  . Breast cyst removal    . Colonoscopy N/A 03/03/2015  RMR: Extensive colonic diverticulosis. Multiple colonic polyps-removed as described above. No overt neoplasm seen.   . Esophagogastroduodenoscopy N/A 03/03/2015    RMR: Noncritical. Schatzki's ring status post esophageal dilation as described above. hiatal hernia; otherwise normal EGD  . Maloney dilation N/A 03/03/2015    Procedure: Venia Minks DILATION;  Surgeon: Daneil Dolin, MD;  [facility];  Service: Endoscopy;  Laterality: N/A;  . Givens capsule study N/A 06/16/2015    Procedure: GIVENS CAPSULE STUDY;  Surgeon: Danie Binder, MD;  [facility];  Service: Endoscopy;  Laterality: N/A;  pt to  arrive at 8:00 for 8:30 appt    SOCIAL HISTORY: Social History   Social History  . Marital Status: Single    Spouse Name: N/A  . Number of Children: N/A  . Years of Education: N/A   Occupational History  . Not on file.   Social History Main Topics  . Smoking status: Former Smoker -- 2.00 packs/day for 40 years    Types: Cigarettes    Quit date: 11/13/2000  . Smokeless tobacco: Never Used     Comment: QUIT SMOKING YRS AGO  . Alcohol Use: No  . Drug Use: No  . Sexual Activity: No   Other Topics Concern  . Not on file   Social History Narrative   ** Merged History Encounter **       Divorced; no children Born in [place]: Family History  Problem Relation Age of Onset  . Hypertension Mother   . Kidney failure Mother   . Cancer Father   . Asthma Father   . Heart failure Father   . Hypertension Father   . Colon cancer Father     diagnosed in his late 14, deceased from lung issues  . Colon cancer Brother     diagnosed in his late 85s, succumbed to the disease   indicated that her mother is deceased. She indicated that her father is deceased. She indicated that her sister is alive.   Dad was 60 when he passed; passed due to emphysema from smoking Mom was 11 when she passed; she was on dialysis and ended up getting MRSA 21 brothers and sisters; she is the oldest girl with two older brothers 69 siblings left living; 5 have passed Donnie passed as a baby with heart trouble Truman Hayward had liver cancer Chip had alcohol complications; died of heatstroke Blanch Media drowned Jan had breast cancer that metastasized to her brain One living sister has had breast cancer  ALLERGIES:  has No Known Allergies.  MEDICATIONS:  MEDICATION LIST FROM [facility] REVIEWED WITH PATIENT AND HER SISTER  Review of Systems  Constitutional: Positive for malaise/fatigue. Negative for fever, chills, weight loss and diaphoresis.  HENT: Negative.   Eyes: Negative.     Respiratory: Positive for shortness of breath and wheezing. Negative for cough, hemoptysis and sputum production.   Cardiovascular: Negative.   Gastrointestinal: Negative.   Genitourinary: Negative.   Musculoskeletal: Positive for joint pain.  Skin: Negative.   Neurological: Positive for sensory change and weakness.       Unable to ambulate, can only use a walker with assistance  Endo/Heme/Allergies: Negative for environmental allergies and polydipsia. Bruises/bleeds easily.  Psychiatric/Behavioral: Positive for memory loss. Negative for depression, suicidal ideas, hallucinations and substance abuse. The patient is not nervous/anxious.   All other systems reviewed and are negative.  14 point ROS was done and is otherwise as detailed above or in HPI  PHYSICAL EXAMINATION: ECOG PERFORMANCE STATUS: 2 - Symptomatic, <50% confined to bed  Filed Vitals:   08/13/15 1324  BP: 143/51  Pulse: 85  Temp: 98.4 F (36.9 C)  Resp: 22   Filed Weights   08/13/15 1324  Weight: 156 lb 9.6 oz (71.033 kg)     Physical Exam  Constitutional: She is oriented to person, place, and time and well-developed, well-nourished, and in no distress.  Examination performed in wheelchair. Unable to ambulate. Wearing O2  HENT:  Head: Normocephalic and atraumatic.  Mouth/Throat: Oropharynx is clear and moist.  Large firm palpable lesion on the back of her head. Benign, non-painful. Chronic  Eyes: Conjunctivae and EOM are normal. Pupils are equal, round, and reactive to light. Right eye exhibits no discharge. Left eye exhibits no discharge. No scleral icterus.  Neurological: She is alert and oriented to person, place, and time.  Skin: Skin is warm and dry.  Psychiatric: Mood normal.  Nursing note and vitals reviewed.   LABORATORY DATA:  I have reviewed the data as listed Lab Results  Component Value Date   WBC 10.1 07/28/2015   HGB 9.6* 07/28/2015   HCT 30.9* 07/28/2015   MCV 100.0 07/28/2015    PLT 313 07/28/2015   CMP     Component Value Date/Time   NA 140 07/28/2015 1501   K 4.7 07/28/2015 1501   CL 89* 07/28/2015 1501   CO2 44* 07/28/2015 1501   GLUCOSE 156* 07/28/2015 1501   BUN 38* 07/28/2015 1501   CREATININE 1.56* 07/28/2015 1501   CALCIUM 8.5* 07/28/2015 1501   PROT 6.5 07/28/2015 1501   ALBUMIN 3.4* 07/28/2015 1501   AST 20 07/28/2015 1501   ALT 24 07/28/2015 1501   ALKPHOS 82 07/28/2015 1501   BILITOT 0.3 07/28/2015 1501   GFRNONAA 32* 07/28/2015 1501   GFRAA 37* 07/28/2015 1501   Results for April Pearson, April Pearson (MRN [id number]) as of 07/29/2015 11:55  Ref. Range 06/09/2015 07:30 06/12/2015 08:00 06/12/2015 11:30 06/28/2015 07:50 07/28/2015 15:01  Hemoglobin Latest Ref Range: 12.0-15.0 g/dL 7.9 (L) 7.8 (L) 8.2 (L) 7.2 (L) 9.6 (L)    RADIOGRAPHIC STUDIES: I have personally reviewed the radiological images as listed and agreed with the findings in the report.  Study Result     CLINICAL DATA: Productive cough and left-sided chest pain since a fall 3 weeks ago.  EXAM: CHEST 1 VIEW  COMPARISON: Single view of the chest 06/12/2015 and 05/15/2015. CT chest 12/29/2013.  FINDINGS: Left PICC remains in place with the tip just above the superior cavoatrial junction. The lungs are emphysematous but clear. Heart size is upper normal. No pneumothorax or pleural effusion. Aortic atherosclerosis noted. Remote bilateral rib fractures are seen.  IMPRESSION: No acute disease.  COPD.  Atherosclerosis.   Electronically Signed  By: Inge Rise M.D.  On: 07/21/2015 14:08    ASSESSMENT & PLAN:  Anemia CKD, Stage III Iron deficiency AVM's found on capsule endoscopy  She has documented AVMs and iron deficiency. She notes she has received 1 dose of iron already and feels somewhat better. We will calculate her total iron deficit and complete her iron replacement. We can also consider whether or not she would benefit from growth factor support, but  we will make this decision as we move forward. I briefly reviewed the patient's laboratory studies from her last visit. We have arranged for her to receive her next dose of Ferrlecit. I will plan on seeing her back in 6 weeks with repeat laboratory studies. Her  blood counts have improved after 1 dose of iron and I advised her we will see where she has when she returns in 6 weeks. We will make additional recommendations regarding either further workup, growth factor support or ongoing observation at her next visit.  Orders Placed This Encounter  Procedures  . CBC with Differential    Standing Status: Future     Number of Occurrences:      Standing Expiration Date: 08/12/2016  . Iron and TIBC    Standing Status: Future     Number of Occurrences:      Standing Expiration Date: 08/12/2016  . Ferritin    Standing Status: Future     Number of Occurrences:      Standing Expiration Date: 08/12/2016   All questions were answered. The patient knows to call the clinic with any problems, questions or concerns.  This document serves as a record of services personally performed by Ancil Linsey, MD. It was created on her behalf by Toni Amend, a trained medical scribe. The creation of this record is based on the scribe's personal observations and the provider's statements to them. This document has been checked and approved by the attending provider.  I have reviewed the above documentation for accuracy and completeness, and I agree with the above.  This note was electronically signed.    Molli Hazard, MD  08/13/2015 2:07 PM

## 2015-08-13 NOTE — Patient Instructions (Signed)
[place] at [facility] Discharge Instructions  RECOMMENDATIONS MADE BY THE CONSULTANT AND ANY TEST RESULTS WILL BE SENT TO YOUR REFERRING PHYSICIAN.  Exam and discussion by Dr. Whitney Muse Will get your iron infusion rescheduled Will recheck blood work 6 weeks after iron infusion. Follow-up with office visit in 6 weeks after infusion.  Thank you for choosing [place] at [facility] to provide your oncology and hematology care.  To afford each patient quality time with our Alaijah Gibler, please arrive at least 15 minutes before your scheduled appointment time.    You need to re-schedule your appointment should you arrive 10 or more minutes late.  We strive to give you quality time with our providers, and arriving late affects you and other patients whose appointments are after yours.  Also, if you no show three or more times for appointments you may be dismissed from the clinic at the providers discretion.     Again, thank you for choosing [facility].  Our hope is that these requests will decrease the amount of time that you wait before being seen by our physicians.       _____________________________________________________________  Should you have questions after your visit to [facility], please contact our office at [phone] between the hours of 8:30 a.m. and 4:30 p.m.  Voicemails left after 4:30 p.m. will not be returned until the following business day.  For prescription refill requests, have your pharmacy contact our office.

## 2015-08-17 ENCOUNTER — Encounter (HOSPITAL_COMMUNITY): Payer: Medicare Other | Attending: Pulmonary Disease

## 2015-08-17 ENCOUNTER — Encounter (HOSPITAL_COMMUNITY): Payer: Self-pay

## 2015-08-17 DIAGNOSIS — D649 Anemia, unspecified: Secondary | ICD-10-CM | POA: Diagnosis not present

## 2015-08-17 DIAGNOSIS — I10 Essential (primary) hypertension: Secondary | ICD-10-CM | POA: Insufficient documentation

## 2015-08-17 MED ORDER — SODIUM CHLORIDE 0.9 % IV SOLN
125.0000 mg | Freq: Once | INTRAVENOUS | Status: AC
  Administered 2015-08-17: 125 mg via INTRAVENOUS
  Filled 2015-08-17: qty 10

## 2015-08-17 MED ORDER — SODIUM CHLORIDE 0.9 % IV SOLN
INTRAVENOUS | Status: DC
  Administered 2015-08-17: 14:00:00 via INTRAVENOUS

## 2015-08-17 NOTE — Patient Instructions (Signed)
[facility] at [facility] Discharge Instructions  RECOMMENDATIONS MADE BY THE CONSULTANT AND ANY TEST RESULTS WILL BE SENT TO YOUR REFERRING PHYSICIAN.  Iron infusion today Follow up as scheduled Please call the clinic if you have any questions or concerns  Thank you for choosing [facility] at [facility] to provide your oncology and hematology care.  To afford each patient quality time with our provider, please arrive at least 15 minutes before your scheduled appointment time.    You need to re-schedule your appointment should you arrive 10 or more minutes late.  We strive to give you quality time with our providers, and arriving late affects you and other patients whose appointments are after yours.  Also, if you no show three or more times for appointments you may be dismissed from the clinic at the providers discretion.     Again, thank you for choosing [facility].  Our hope is that these requests will decrease the amount of time that you wait before being seen by our physicians.       _____________________________________________________________  Should you have questions after your visit to [facility], please contact our office at [phone] between the hours of 8:30 a.m. and 4:30 p.m.  Voicemails left after 4:30 p.m. will not be returned until the following business day.  For prescription refill requests, have your pharmacy contact our office.    

## 2015-08-17 NOTE — Progress Notes (Signed)
April Pearson Tolerated iron infusion well today Discharged ambulatory

## 2015-08-18 ENCOUNTER — Other Ambulatory Visit: Payer: Self-pay | Admitting: *Deleted

## 2015-08-18 MED ORDER — HYDROCODONE-ACETAMINOPHEN 5-325 MG PO TABS: ORAL_TABLET | ORAL | Status: AC

## 2015-08-23 DIAGNOSIS — H524 Presbyopia: Secondary | ICD-10-CM | POA: Diagnosis not present

## 2015-08-23 DIAGNOSIS — Z7952 Long term (current) use of systemic steroids: Secondary | ICD-10-CM | POA: Diagnosis not present

## 2015-08-23 DIAGNOSIS — Z961 Presence of intraocular lens: Secondary | ICD-10-CM | POA: Diagnosis not present

## 2015-08-23 DIAGNOSIS — Z7951 Long term (current) use of inhaled steroids: Secondary | ICD-10-CM | POA: Diagnosis not present

## 2015-08-29 DIAGNOSIS — J449 Chronic obstructive pulmonary disease, unspecified: Secondary | ICD-10-CM | POA: Diagnosis not present

## 2015-08-29 DIAGNOSIS — D649 Anemia, unspecified: Secondary | ICD-10-CM | POA: Diagnosis not present

## 2015-09-01 NOTE — Progress Notes (Signed)
This is documentation of my visit of 08/29/2015 at the skilled care facility. She says she's still having significant issues with her breathing. She is on maximum therapy now including oxygen nebulizer treatments inhaled bronchodilators. She says otherwise she is doing about the same. She still has significant issues with anxiety  She is sitting upright in a wheelchair. She is wearing oxygen. She looks mildly anxious. Her chest shows rhonchi bilaterally. Her heart is regular without gallop. Her abdomen is soft without masses.  She has severe end-stage COPD with chronic hypoxic respiratory failure. I don't think there is really anything to add. She has hypertension which is pretty well controlled. She has chronic tachycardia which is doing well. She has had bouts of pneumonia and seems clearer now. She has significant anxiety and depression/bipolar disease which is stable but pretty severe  Continue treatments. I don't think there is really anything to add

## 2015-09-22 NOTE — Progress Notes (Signed)
[facility] [place] NOTE  Patient Care Team: Sinda Du, MD as PCP - General (Pulmonary Disease) Sinda Du, MD (Internal Medicine) Danie Binder, MD as Consulting Physician (Gastroenterology)  CHIEF COMPLAINTS/PURPOSE OF CONSULTATION:   Hemoccult positive stool; iron deficiency anemia; small bowel AVM's Colonoscopy and EGD on 03/03/2015 Capsule endoscopy on 06/07/2015 Iron saturation of 5% on 06/09/2015 Serum iron of 15 ?g/dL on 06/07/2015 History of bladder cancer CKD Stage III  HISTORY OF PRESENTING ILLNESS:  April Pearson 74 y.o. female is here on a referral from her GI doctor due to hemoccult positive stool, iron deficiency anemia, and small bowel AVM's.  The patient is here today with her sister. She looks good today.  She has done well with iron. She notes her biggest problem is her breathing. She has no other major complaints or concerns.    MEDICAL HISTORY:  Past Medical History  Diagnosis Date  . Osteoporosis   . Weakness   . Tachycardia   . Arthritis     KNEES AND HANDS  . Coarse tremors     TREMORS BOTH HANDS - PT STATES SIDE EFFECT OF HER MEDICATIONS FOR HER BREATHING  . Dizziness     sometimes  . Shortness of breath     USES OXYGEN 2 L / MIN NASAL CANNUA   24 HRS A DAY; LIVES IN [name]--CAN AMBULATE SHORT DISTANCE BUT ACTIVITIES VERY LIMITED BY SOB  . Anginal pain ([place])     "with fluttering, seen MD about it"  . History of shingles   . Emphysema   . GERD (gastroesophageal reflux disease)     sometimes  . Laryngitis     10/2013, hx of  . Pneumonia     hx of  . Bipolar 1 disorder ([place])   . Cough 06/29/14    pt states recent cough / cold - feeling better - cough now non-productive.  . Frequent urination   . COPD (chronic obstructive pulmonary disease) ([place])   . Cancer ([place])     bladder  . Anxiety   . Depression   . Edema   . Insomnia     SURGICAL HISTORY: Past  Surgical History  Procedure Laterality Date  . Bladder surgery    . Cystoscopy w/ retrogrades Bilateral 06/19/2013    Procedure: CYSTOSCOPY WITH BIOSPY AND FULGERATION, BILATERAL RETROGRADE PYELOGRAM;  Surgeon: Malka So, MD;  Location: [facility];  Service: Urology;  Laterality: Bilateral;  . Breast surgery Left     FOR TUMOR - BENIGN  . Cataract extraction Bilateral 3 years ago  . Cystoscopy w/ retrogrades Bilateral 12/25/2013    Procedure: CYSTOSCOPY WITH BILATERAL RETROGRADE WITH BIOPSY WITH FULGERATION;  Surgeon: Irine Seal, MD;  Location: [facility];  Service: Urology;  Laterality: Bilateral;  . Transurethral resection of bladder tumor Bilateral 12/25/2013    Procedure: TRANSURETHRAL RESECTION OF BLADDER TUMOR (TURBT);  Surgeon: Irine Seal, MD;  Location: [facility];  Service: Urology;  Laterality: Bilateral;  . Cystoscopy with retrograde pyelogram, ureteroscopy and stent placement Bilateral 07/02/2014    Procedure: CYSTOSCOPY WITH BILATERAL RETROGRADE PYELOGRAM;  Surgeon: Malka So, MD;  Location: [facility];  Service: Urology;  Laterality: Bilateral;  . Transurethral resection of bladder tumor N/A 07/02/2014    Procedure: TRANSURETHRAL RESECTION OF BLADDER TUMOR (TURBT);  Surgeon: Malka So, MD;  [facility];  Service: Urology;  Laterality: N/A;  . Bladder surgery  2015  . Breast  cyst removal    . Colonoscopy N/A 03/03/2015    RMR: Extensive colonic diverticulosis. Multiple colonic polyps-removed as described above. No overt neoplasm seen.   . Esophagogastroduodenoscopy N/A 03/03/2015    RMR: Noncritical. Schatzki's ring status post esophageal dilation as described above. hiatal hernia; otherwise normal EGD  . Maloney dilation N/A 03/03/2015    Procedure: Venia Minks DILATION;  Surgeon: Daneil Dolin, MD;  [facility];  Service: Endoscopy;  Laterality: N/A;  . Givens capsule study N/A 06/16/2015    Procedure: GIVENS CAPSULE STUDY;  Surgeon: Danie Binder, MD;  [facility];   Service: Endoscopy;  Laterality: N/A;  pt to arrive at 8:00 for 8:30 appt    SOCIAL HISTORY: Social History   Social History  . Marital Status: Single    Spouse Name: N/A  . Number of Children: N/A  . Years of Education: N/A   Occupational History  . Not on file.   Social History Main Topics  . Smoking status: Former Smoker -- 2.00 packs/day for 40 years    Types: Cigarettes    Quit date: 11/13/2000  . Smokeless tobacco: Never Used     Comment: QUIT SMOKING YRS AGO  . Alcohol Use: No  . Drug Use: No  . Sexual Activity: No   Other Topics Concern  . Not on file   Social History Narrative   ** Merged History Encounter **       Divorced; no children Born in [place]: Family History  Problem Relation Age of Onset  . Hypertension Mother   . Kidney failure Mother   . Cancer Father   . Asthma Father   . Heart failure Father   . Hypertension Father   . Colon cancer Father     diagnosed in his late 50, deceased from lung issues  . Colon cancer Brother     diagnosed in his late 26s, succumbed to the disease   indicated that her mother is deceased. She indicated that her father is deceased. She indicated that her sister is alive.   Dad was 42 when he passed; passed due to emphysema from smoking Mom was 20 when she passed; she was on dialysis and ended up getting MRSA 50 brothers and sisters; she is the oldest girl with two older brothers 5 siblings left living; 5 have passed Donnie passed as a baby with heart trouble Truman Hayward had liver cancer Chip had alcohol complications; died of heatstroke Blanch Media drowned Jan had breast cancer that metastasized to her brain One living sister has had breast cancer  ALLERGIES:  has No Known Allergies.  MEDICATIONS:  MEDICATION LIST FROM [facility] REVIEWED WITH PATIENT AND HER SISTER  Review of Systems  Constitutional: Positive for malaise/fatigue. Negative for fever, chills, weight loss and diaphoresis.    HENT: Negative.   Eyes: Negative.   Respiratory: Positive for shortness of breath and wheezing. Negative for cough, hemoptysis and sputum production.   Cardiovascular: Negative.   Gastrointestinal: Negative.   Genitourinary: Negative.   Musculoskeletal: Positive for joint pain.  Skin: Negative.   Neurological: Positive for sensory change and weakness.       Unable to ambulate, can only use a walker with assistance  Endo/Heme/Allergies: Negative for environmental allergies and polydipsia. Bruises/bleeds easily.  Psychiatric/Behavioral: Positive for memory loss. Negative for depression, suicidal ideas, hallucinations and substance abuse. The patient is not nervous/anxious.   All other systems reviewed and are negative.  14 point ROS was  done and is otherwise as detailed above or in HPI   PHYSICAL EXAMINATION: ECOG PERFORMANCE STATUS: 2 - Symptomatic, <50% confined to bed  Filed Vitals:   09/23/15 1045  BP: 134/50  Pulse: 73  Temp: 97.9 F (36.6 C)  Resp: 20   Filed Weights   09/23/15 1045  Weight: 155 lb 12.8 oz (70.67 kg)    Physical Exam  Constitutional: She is oriented to person, place, and time and well-developed, well-nourished, and in no distress.  Examination performed in wheelchair. Unable to ambulate. Wearing O2  HENT:  Head: Normocephalic and atraumatic.  Mouth/Throat: Oropharynx is clear and moist.  Large firm palpable lesion on the back of her head. Benign, non-painful. Chronic  Cardiac: S1/S2 audible Pulmonary: Clear to auscultation, decreased BS throughout Abdomen: Soft/NT/ND, + BS throughout Eyes: Conjunctivae and EOM are normal. Pupils are equal, round, and reactive to light. Right eye exhibits no discharge. Left eye exhibits no discharge. No scleral icterus.  Neurological: She is alert and oriented to person, place, and time.  Skin: Skin is warm and dry.  Psychiatric: Mood normal.  Nursing note and vitals reviewed.   LABORATORY DATA:  I have  reviewed the data as listed  Results for KASIYA, BURCK (MRN [id number])   Ref. Range 09/23/2015 10:10  Iron Latest Ref Range: 28-170 ug/dL 54  UIBC Latest Units: ug/dL 240  TIBC Latest Ref Range: 250-450 ug/dL 294  Saturation Ratios Latest Ref Range: 10.4-31.8 % 18  Ferritin Latest Ref Range: 11-307 ng/mL 28  WBC Latest Ref Range: 4.0-10.5 K/uL 9.3  RBC Latest Ref Range: 3.87-5.11 MIL/uL 3.05 (L)  Hemoglobin Latest Ref Range: 12.0-15.0 g/dL 9.1 (L)  HCT Latest Ref Range: 36.0-46.0 % 30.0 (L)  MCV Latest Ref Range: 78.0-100.0 fL 98.4  MCH Latest Ref Range: 26.0-34.0 pg 29.8  MCHC Latest Ref Range: 30.0-36.0 g/dL 30.3  RDW Latest Ref Range: 11.5-15.5 % 16.3 (H)  Platelets Latest Ref Range: 150-400 K/uL 260  Neutrophils Latest Units: % 83  Lymphocytes Latest Units: % 11  Monocytes Relative Latest Units: % 5  Eosinophil Latest Units: % 1  Basophil Latest Units: % 0  NEUT# Latest Ref Range: 1.7-7.7 K/uL 7.7  Lymphocyte # Latest Ref Range: 0.7-4.0 K/uL 1.0  Monocyte # Latest Ref Range: 0.1-1.0 K/uL 0.5  Eosinophils Absolute Latest Ref Range: 0.0-0.7 K/uL 0.1  Basophils Absolute Latest Ref Range: 0.0-0.1 K/uL 0.0     RADIOGRAPHIC STUDIES: I have personally reviewed the radiological images as listed and agreed with the findings in the report.  CLINICAL DATA: Cough for 1 year. History of emphysema.  EXAM: CHEST 1 VIEW  COMPARISON: 07/21/2015  FINDINGS: There is bilateral mild chronic interstitial lung disease. There is no focal parenchymal opacity. There is no pleural effusion or pneumothorax. The heart and mediastinal contours are unremarkable. There is thoracic aortic atherosclerosis. There is a right-sided PICC line with the tip projecting over the SVC.  The osseous structures are unremarkable.  IMPRESSION: No active disease.   Electronically Signed  By: Kathreen Devoid  On: 08/13/2015 09:49   ASSESSMENT & PLAN:  Anemia CKD, Stage III Iron  deficiency AVM's found on capsule endoscopy  She has documented AVMs and iron deficiency. She notes she has received 1 dose of iron already and feels somewhat better. We will calculate her total iron deficit and complete her iron replacement.  After discussing options today she is interested in proceeding with ESA therapy; she will be started on renal dosing of Aranesp every 2 weeks.  Continue to follow iron levels with goal being to keep serum ferritin greater than or equal to 100. Based on current labs she will need additional iron replacement and is agreeable to proceed.   She will return in 3 months for additional follow-up and reassessment.  All questions were answered. The patient knows to call the clinic with any problems, questions or concerns.  This note was electronically signed.    Molli Hazard, MD  11/06/2015 7:53 AM

## 2015-09-23 ENCOUNTER — Encounter (HOSPITAL_BASED_OUTPATIENT_CLINIC_OR_DEPARTMENT_OTHER): Payer: Medicare Other

## 2015-09-23 ENCOUNTER — Encounter (HOSPITAL_COMMUNITY): Payer: Self-pay | Admitting: Hematology & Oncology

## 2015-09-23 ENCOUNTER — Encounter (HOSPITAL_BASED_OUTPATIENT_CLINIC_OR_DEPARTMENT_OTHER): Payer: Medicare Other | Admitting: Hematology & Oncology

## 2015-09-23 ENCOUNTER — Other Ambulatory Visit (HOSPITAL_COMMUNITY): Payer: Self-pay | Admitting: Hematology & Oncology

## 2015-09-23 VITALS — BP 134/50 | HR 73 | Temp 97.9°F | Resp 20 | Wt 155.8 lb

## 2015-09-23 DIAGNOSIS — D508 Other iron deficiency anemias: Secondary | ICD-10-CM | POA: Diagnosis not present

## 2015-09-23 DIAGNOSIS — D631 Anemia in chronic kidney disease: Secondary | ICD-10-CM | POA: Insufficient documentation

## 2015-09-23 DIAGNOSIS — N189 Chronic kidney disease, unspecified: Secondary | ICD-10-CM | POA: Insufficient documentation

## 2015-09-23 DIAGNOSIS — F319 Bipolar disorder, unspecified: Secondary | ICD-10-CM | POA: Diagnosis present

## 2015-09-23 DIAGNOSIS — D509 Iron deficiency anemia, unspecified: Secondary | ICD-10-CM | POA: Insufficient documentation

## 2015-09-23 DIAGNOSIS — D500 Iron deficiency anemia secondary to blood loss (chronic): Secondary | ICD-10-CM | POA: Diagnosis not present

## 2015-09-23 DIAGNOSIS — Z452 Encounter for adjustment and management of vascular access device: Secondary | ICD-10-CM | POA: Diagnosis not present

## 2015-09-23 DIAGNOSIS — N183 Chronic kidney disease, stage 3 (moderate): Secondary | ICD-10-CM | POA: Diagnosis not present

## 2015-09-23 DIAGNOSIS — D649 Anemia, unspecified: Secondary | ICD-10-CM

## 2015-09-23 DIAGNOSIS — Q2733 Arteriovenous malformation of digestive system vessel: Secondary | ICD-10-CM | POA: Diagnosis not present

## 2015-09-23 LAB — IRON AND TIBC
IRON: 54 ug/dL (ref 28–170)
SATURATION RATIOS: 18 % (ref 10.4–31.8)
TIBC: 294 ug/dL (ref 250–450)
UIBC: 240 ug/dL

## 2015-09-23 LAB — CBC WITH DIFFERENTIAL/PLATELET
BASOS ABS: 0 10*3/uL (ref 0.0–0.1)
BASOS PCT: 0 %
EOS ABS: 0.1 10*3/uL (ref 0.0–0.7)
EOS PCT: 1 %
HCT: 30 % — ABNORMAL LOW (ref 36.0–46.0)
Hemoglobin: 9.1 g/dL — ABNORMAL LOW (ref 12.0–15.0)
Lymphocytes Relative: 11 %
Lymphs Abs: 1 10*3/uL (ref 0.7–4.0)
MCH: 29.8 pg (ref 26.0–34.0)
MCHC: 30.3 g/dL (ref 30.0–36.0)
MCV: 98.4 fL (ref 78.0–100.0)
Monocytes Absolute: 0.5 10*3/uL (ref 0.1–1.0)
Monocytes Relative: 5 %
Neutro Abs: 7.7 10*3/uL (ref 1.7–7.7)
Neutrophils Relative %: 83 %
PLATELETS: 260 10*3/uL (ref 150–400)
RBC: 3.05 MIL/uL — AB (ref 3.87–5.11)
RDW: 16.3 % — ABNORMAL HIGH (ref 11.5–15.5)
WBC: 9.3 10*3/uL (ref 4.0–10.5)

## 2015-09-23 LAB — FERRITIN: Ferritin: 28 ng/mL (ref 11–307)

## 2015-09-23 MED ORDER — SODIUM CHLORIDE 0.9 % IJ SOLN
10.0000 mL | INTRAMUSCULAR | Status: AC | PRN
  Administered 2015-09-23: 10 mL via INTRAVENOUS
  Filled 2015-09-23: qty 10

## 2015-09-23 MED ORDER — HEPARIN SOD (PORK) LOCK FLUSH 100 UNIT/ML IV SOLN
250.0000 [IU] | Freq: Once | INTRAVENOUS | Status: AC
  Administered 2015-09-23: 250 [IU] via INTRAVENOUS

## 2015-09-23 MED ORDER — HEPARIN SOD (PORK) LOCK FLUSH 100 UNIT/ML IV SOLN
INTRAVENOUS | Status: AC
  Filled 2015-09-23: qty 5

## 2015-09-23 NOTE — Progress Notes (Signed)
April Pearson presented for PICC access for labs.  See IV assessment in docflowsheets for PICC details.  Proper placement of PICC confirmed by CXR.  PICC located rt arm.  Good blood return present. PICC flushed with 71ml NS and 250U Heparin, see MAR for further details.  April Pearson tolerated procedure well and without incident.

## 2015-09-23 NOTE — Patient Instructions (Signed)
[place] at [facility] Discharge Instructions  RECOMMENDATIONS MADE BY THE CONSULTANT AND ANY TEST RESULTS WILL BE SENT TO YOUR REFERRING PHYSICIAN.  Exam and discussion by Dr. Whitney Muse Will start aranesp after authorization obtained If iron is needed we will call your. Continue with labs  Follow-up next week for injection Office visit in 3 months.  Thank you for choosing [place] at [facility] to provide your oncology and hematology care.  To afford each patient quality time with our provider, please arrive at least 15 minutes before your scheduled appointment time.    You need to re-schedule your appointment should you arrive 10 or more minutes late.  We strive to give you quality time with our providers, and arriving late affects you and other patients whose appointments are after yours.  Also, if you no show three or more times for appointments you may be dismissed from the clinic at the providers discretion.     Again, thank you for choosing [facility].  Our hope is that these requests will decrease the amount of time that you wait before being seen by our physicians.       _____________________________________________________________  Should you have questions after your visit to [facility], please contact our office at [phone] between the hours of 8:30 a.m. and 4:30 p.m.  Voicemails left after 4:30 p.m. will not be returned until the following business day.  For prescription refill requests, have your pharmacy contact our office.

## 2015-09-23 NOTE — Addendum Note (Signed)
Addended by: Kurtis Bushman A on: 09/23/2015 11:00 AM   Modules accepted: Orders, SmartSet

## 2015-09-27 ENCOUNTER — Encounter (HOSPITAL_BASED_OUTPATIENT_CLINIC_OR_DEPARTMENT_OTHER): Payer: Medicare Other

## 2015-09-27 DIAGNOSIS — D509 Iron deficiency anemia, unspecified: Secondary | ICD-10-CM | POA: Diagnosis not present

## 2015-09-27 MED ORDER — SODIUM CHLORIDE 0.9 % IV SOLN
510.0000 mg | Freq: Once | INTRAVENOUS | Status: AC
  Administered 2015-09-27: 510 mg via INTRAVENOUS
  Filled 2015-09-27: qty 17

## 2015-09-27 MED ORDER — HEPARIN SOD (PORK) LOCK FLUSH 100 UNIT/ML IV SOLN
INTRAVENOUS | Status: AC
  Filled 2015-09-27: qty 5

## 2015-09-27 MED ORDER — SODIUM CHLORIDE 0.9 % IV SOLN
INTRAVENOUS | Status: DC
  Administered 2015-09-27: 13:00:00 via INTRAVENOUS

## 2015-09-27 NOTE — Progress Notes (Signed)
Patient tolerated infusion well.  VSS post infusion.   

## 2015-09-27 NOTE — Patient Instructions (Signed)
[facility] at [facility] Discharge Instructions  RECOMMENDATIONS MADE BY THE CONSULTANT AND ANY TEST RESULTS WILL BE SENT TO YOUR REFERRING PHYSICIAN.  IV Iron today.    Thank you for choosing [facility] at [facility] to provide your oncology and hematology care.  To afford each patient quality time with our provider, please arrive at least 15 minutes before your scheduled appointment time.    You need to re-schedule your appointment should you arrive 10 or more minutes late.  We strive to give you quality time with our providers, and arriving late affects you and other patients whose appointments are after yours.  Also, if you no show three or more times for appointments you may be dismissed from the clinic at the providers discretion.     Again, thank you for choosing [facility].  Our hope is that these requests will decrease the amount of time that you wait before being seen by our physicians.       _____________________________________________________________  Should you have questions after your visit to [facility], please contact our office at [phone] between the hours of 8:30 a.m. and 4:30 p.m.  Voicemails left after 4:30 p.m. will not be returned until the following business day.  For prescription refill requests, have your pharmacy contact our office.     

## 2015-09-29 ENCOUNTER — Encounter (HOSPITAL_COMMUNITY): Payer: Self-pay

## 2015-09-29 ENCOUNTER — Encounter (HOSPITAL_BASED_OUTPATIENT_CLINIC_OR_DEPARTMENT_OTHER): Payer: Medicare Other

## 2015-09-29 VITALS — BP 124/56 | HR 78 | Temp 97.7°F | Resp 20

## 2015-09-29 DIAGNOSIS — N189 Chronic kidney disease, unspecified: Secondary | ICD-10-CM | POA: Diagnosis not present

## 2015-09-29 DIAGNOSIS — D509 Iron deficiency anemia, unspecified: Secondary | ICD-10-CM | POA: Diagnosis not present

## 2015-09-29 DIAGNOSIS — D649 Anemia, unspecified: Secondary | ICD-10-CM | POA: Diagnosis not present

## 2015-09-29 DIAGNOSIS — D631 Anemia in chronic kidney disease: Secondary | ICD-10-CM

## 2015-09-29 DIAGNOSIS — D500 Iron deficiency anemia secondary to blood loss (chronic): Secondary | ICD-10-CM | POA: Diagnosis not present

## 2015-09-29 LAB — CBC
HEMATOCRIT: 33.6 % — AB (ref 36.0–46.0)
HEMOGLOBIN: 9.9 g/dL — AB (ref 12.0–15.0)
MCH: 29.9 pg (ref 26.0–34.0)
MCHC: 29.5 g/dL — ABNORMAL LOW (ref 30.0–36.0)
MCV: 101.5 fL — ABNORMAL HIGH (ref 78.0–100.0)
Platelets: 279 10*3/uL (ref 150–400)
RBC: 3.31 MIL/uL — AB (ref 3.87–5.11)
RDW: 15.4 % (ref 11.5–15.5)
WBC: 14.9 10*3/uL — AB (ref 4.0–10.5)

## 2015-09-29 MED ORDER — DARBEPOETIN ALFA 60 MCG/0.3ML IJ SOSY
PREFILLED_SYRINGE | INTRAMUSCULAR | Status: AC
  Filled 2015-09-29: qty 0.3

## 2015-09-29 MED ORDER — DARBEPOETIN ALFA 60 MCG/0.3ML IJ SOSY
60.0000 ug | PREFILLED_SYRINGE | Freq: Once | INTRAMUSCULAR | Status: AC
  Administered 2015-09-29: 60 ug via SUBCUTANEOUS

## 2015-09-29 NOTE — Progress Notes (Signed)
April Pearson's reason for visit today is for an injection and labs as scheduled per MD orders.  Labs were drawn prior to administration of ordered medication.    April Pearson also received aranesp 49mcg per MD orders; see [facility] for administration details.  April Pearson tolerated all procedures well and without incident; questions were answered and patient was discharged.

## 2015-09-29 NOTE — Patient Instructions (Signed)
[place] at [facility] Discharge Instructions  RECOMMENDATIONS MADE BY THE CONSULTANT AND ANY TEST RESULTS WILL BE SENT TO YOUR REFERRING PHYSICIAN.  Hemoglobin 9.9 Aranesp 60 mcg Follow up as scheduled Please call the clinic if you have any questions or concerns  Thank you for choosing [place] at [facility] to provide your oncology and hematology care.  To afford each patient quality time with our provider, please arrive at least 15 minutes before your scheduled appointment time.    You need to re-schedule your appointment should you arrive 10 or more minutes late.  We strive to give you quality time with our providers, and arriving late affects you and other patients whose appointments are after yours.  Also, if you no show three or more times for appointments you may be dismissed from the clinic at the providers discretion.     Again, thank you for choosing [facility].  Our hope is that these requests will decrease the amount of time that you wait before being seen by our physicians.       _____________________________________________________________  Should you have questions after your visit to [facility], please contact our office at [phone] between the hours of 8:30 a.m. and 4:30 p.m.  Voicemails left after 4:30 p.m. will not be returned until the following business day.  For prescription refill requests, have your pharmacy contact our office.

## 2015-10-01 NOTE — Progress Notes (Signed)
Labs drawn

## 2015-10-03 DIAGNOSIS — D649 Anemia, unspecified: Secondary | ICD-10-CM | POA: Diagnosis not present

## 2015-10-03 DIAGNOSIS — J449 Chronic obstructive pulmonary disease, unspecified: Secondary | ICD-10-CM | POA: Diagnosis not present

## 2015-10-04 NOTE — Progress Notes (Signed)
This is documentation of my visit at the skilled care facility on 10/03/2015. She says she doesn't feel well and still has significant difficulty breathing. I think she has unfortunately set a new baseline. She has chronic respiratory failure. She is on oxygen. She has had trouble with anemia and has been treated for that and it seems to have improved. She has significant trouble with anxiety and depression and she is very anxious because she is short of breath  She is awake and alert. She looks anxious. She is wearing oxygen by nasal cannula. Her chest is actually fairly clear with decreased breath sounds. Her heart is regular. Abdomen is soft and she does not have any edema  She has severe COPD with chronic hypoxic respiratory failure. This was complicated by anemia of chronic disease which is being treated. She has hypertension which is well-controlled and pretty severe anxiety and depression/bipolar disease which is worse because of her increasing problems with her breathing  No change in treatments. She is on maximum medications for her COPD

## 2015-10-05 ENCOUNTER — Encounter (HOSPITAL_COMMUNITY)
Admission: AD | Admit: 2015-10-05 | Discharge: 2015-10-05 | Disposition: A | Discharge status: Home / Self Care | Payer: Medicare Other | Source: Skilled Nursing Facility | Attending: Internal Medicine | Admitting: Internal Medicine

## 2015-10-05 DIAGNOSIS — D631 Anemia in chronic kidney disease: Secondary | ICD-10-CM | POA: Diagnosis not present

## 2015-10-05 DIAGNOSIS — D649 Anemia, unspecified: Secondary | ICD-10-CM | POA: Diagnosis not present

## 2015-10-05 DIAGNOSIS — D509 Iron deficiency anemia, unspecified: Secondary | ICD-10-CM | POA: Diagnosis not present

## 2015-10-05 DIAGNOSIS — D500 Iron deficiency anemia secondary to blood loss (chronic): Secondary | ICD-10-CM | POA: Diagnosis not present

## 2015-10-05 DIAGNOSIS — N189 Chronic kidney disease, unspecified: Secondary | ICD-10-CM | POA: Diagnosis not present

## 2015-10-05 LAB — LIPID PANEL
CHOL/HDL RATIO: 2.7 ratio
Cholesterol: 184 mg/dL (ref 0–200)
HDL: 69 mg/dL (ref >40)
LDL CALC: 102 mg/dL — AB (ref 0–99)
Triglycerides: 67 mg/dL (ref <150)
VLDL: 13 mg/dL (ref 0–40)

## 2015-10-06 LAB — HEMOGLOBIN A1C
Hgb A1c MFr Bld: 5.2 % (ref 4.8–5.6)
MEAN PLASMA GLUCOSE: 103 mg/dL

## 2015-10-10 ENCOUNTER — Emergency Department (HOSPITAL_COMMUNITY)
Admission: EM | Admit: 2015-10-10 | Discharge: 2015-10-10 | Disposition: A | Discharge status: Home / Self Care | Payer: Medicare Other | Attending: Emergency Medicine | Admitting: Emergency Medicine

## 2015-10-10 ENCOUNTER — Inpatient Hospital Stay
Admission: RE | Admit: 2015-10-10 | Discharge: 2015-11-11 | Disposition: A | Discharge status: Skilled Nursing Facility | Payer: Medicare Other | Source: Ambulatory Visit | Attending: Pulmonary Disease | Admitting: Pulmonary Disease

## 2015-10-10 ENCOUNTER — Encounter (HOSPITAL_COMMUNITY): Payer: Self-pay | Admitting: *Deleted

## 2015-10-10 ENCOUNTER — Emergency Department (HOSPITAL_COMMUNITY): Payer: Medicare Other

## 2015-10-10 DIAGNOSIS — F419 Anxiety disorder, unspecified: Secondary | ICD-10-CM | POA: Insufficient documentation

## 2015-10-10 DIAGNOSIS — Z7951 Long term (current) use of inhaled steroids: Secondary | ICD-10-CM | POA: Insufficient documentation

## 2015-10-10 DIAGNOSIS — Z872 Personal history of diseases of the skin and subcutaneous tissue: Secondary | ICD-10-CM | POA: Insufficient documentation

## 2015-10-10 DIAGNOSIS — J690 Pneumonitis due to inhalation of food and vomit: Principal | ICD-10-CM

## 2015-10-10 DIAGNOSIS — J40 Bronchitis, not specified as acute or chronic: Secondary | ICD-10-CM

## 2015-10-10 DIAGNOSIS — K219 Gastro-esophageal reflux disease without esophagitis: Secondary | ICD-10-CM | POA: Diagnosis not present

## 2015-10-10 DIAGNOSIS — Z8701 Personal history of pneumonia (recurrent): Secondary | ICD-10-CM | POA: Diagnosis not present

## 2015-10-10 DIAGNOSIS — Z79899 Other long term (current) drug therapy: Secondary | ICD-10-CM | POA: Insufficient documentation

## 2015-10-10 DIAGNOSIS — Z7952 Long term (current) use of systemic steroids: Secondary | ICD-10-CM | POA: Insufficient documentation

## 2015-10-10 DIAGNOSIS — R0789 Other chest pain: Secondary | ICD-10-CM | POA: Diagnosis not present

## 2015-10-10 DIAGNOSIS — F329 Major depressive disorder, single episode, unspecified: Secondary | ICD-10-CM | POA: Insufficient documentation

## 2015-10-10 DIAGNOSIS — Z8669 Personal history of other diseases of the nervous system and sense organs: Secondary | ICD-10-CM | POA: Insufficient documentation

## 2015-10-10 DIAGNOSIS — R05 Cough: Secondary | ICD-10-CM | POA: Diagnosis not present

## 2015-10-10 DIAGNOSIS — R069 Unspecified abnormalities of breathing: Secondary | ICD-10-CM | POA: Diagnosis not present

## 2015-10-10 DIAGNOSIS — M1990 Unspecified osteoarthritis, unspecified site: Secondary | ICD-10-CM | POA: Insufficient documentation

## 2015-10-10 DIAGNOSIS — J439 Emphysema, unspecified: Secondary | ICD-10-CM | POA: Diagnosis not present

## 2015-10-10 DIAGNOSIS — R0602 Shortness of breath: Secondary | ICD-10-CM | POA: Diagnosis present

## 2015-10-10 DIAGNOSIS — Z87891 Personal history of nicotine dependence: Secondary | ICD-10-CM | POA: Insufficient documentation

## 2015-10-10 DIAGNOSIS — R059 Cough, unspecified: Secondary | ICD-10-CM

## 2015-10-10 DIAGNOSIS — Z8551 Personal history of malignant neoplasm of bladder: Secondary | ICD-10-CM | POA: Diagnosis not present

## 2015-10-10 LAB — COMPREHENSIVE METABOLIC PANEL
ALK PHOS: 63 U/L (ref 38–126)
ALT: 16 U/L (ref 14–54)
ANION GAP: 11 (ref 5–15)
AST: 15 U/L (ref 15–41)
Albumin: 3.7 g/dL (ref 3.5–5.0)
BILIRUBIN TOTAL: 0.4 mg/dL (ref 0.3–1.2)
BUN: 39 mg/dL — ABNORMAL HIGH (ref 6–20)
CALCIUM: 9.3 mg/dL (ref 8.9–10.3)
CO2: 43 mmol/L — AB (ref 22–32)
CREATININE: 1.04 mg/dL — AB (ref 0.44–1.00)
Chloride: 92 mmol/L — ABNORMAL LOW (ref 101–111)
GFR calc non Af Amer: 52 mL/min — ABNORMAL LOW (ref >60)
GFR, EST AFRICAN AMERICAN: 60 mL/min — AB (ref >60)
Glucose, Bld: 107 mg/dL — ABNORMAL HIGH (ref 65–99)
Potassium: 3.6 mmol/L (ref 3.5–5.1)
Sodium: 146 mmol/L — ABNORMAL HIGH (ref 135–145)
TOTAL PROTEIN: 6.3 g/dL — AB (ref 6.5–8.1)

## 2015-10-10 LAB — CBC WITH DIFFERENTIAL/PLATELET
BASOS ABS: 0 10*3/uL (ref 0.0–0.1)
Basophils Relative: 0 %
Eosinophils Absolute: 0.1 10*3/uL (ref 0.0–0.7)
Eosinophils Relative: 1 %
HEMATOCRIT: 34.7 % — AB (ref 36.0–46.0)
Hemoglobin: 10.5 g/dL — ABNORMAL LOW (ref 12.0–15.0)
LYMPHS ABS: 0.8 10*3/uL (ref 0.7–4.0)
LYMPHS PCT: 7 %
MCH: 30.5 pg (ref 26.0–34.0)
MCHC: 30.3 g/dL (ref 30.0–36.0)
MCV: 100.9 fL — AB (ref 78.0–100.0)
MONO ABS: 0.5 10*3/uL (ref 0.1–1.0)
Monocytes Relative: 4 %
NEUTROS ABS: 9.8 10*3/uL — AB (ref 1.7–7.7)
Neutrophils Relative %: 88 %
Platelets: 209 10*3/uL (ref 150–400)
RBC: 3.44 MIL/uL — ABNORMAL LOW (ref 3.87–5.11)
RDW: 17.1 % — AB (ref 11.5–15.5)
WBC: 11.3 10*3/uL — ABNORMAL HIGH (ref 4.0–10.5)

## 2015-10-10 LAB — D-DIMER, QUANTITATIVE (NOT AT ARMC): D DIMER QUANT: 0.48 ug{FEU}/mL (ref 0.00–0.50)

## 2015-10-10 LAB — TROPONIN I: Troponin I: <0.03 ng/mL (ref <0.031)

## 2015-10-10 MED ORDER — AZITHROMYCIN 250 MG PO TABS: 250.0000 mg | ORAL_TABLET | Freq: Every day | ORAL | Status: DC

## 2015-10-10 NOTE — ED Notes (Signed)
Pt started having mild difficulty breathing 2 days ago with worsening. Pt has non-productive cough with chest wall pain when she coughs. Pt is alert and oriented at this time.    Pt does have a DNR, paperwork at bedside.

## 2015-10-10 NOTE — ED Provider Notes (Signed)
  Face-to-face evaluation   History: She complains of soreness in her lower left chest, and having cough which is nonproductive for 2 days.    Physical exam: Alert, elderly female. Skin is warm to touch. Heart regular rate and rhythm, no murmur. Lungs fair air movement bilaterally without wheezes, Rales or rhonchi.  Medical screening examination/treatment/procedure(s) were conducted as a shared visit with non-physician practitioner(s) and myself.  I personally evaluated the patient during the encounter  [name], MD 10/11/15 2039

## 2015-10-10 NOTE — Discharge Instructions (Signed)

## 2015-10-10 NOTE — ED Notes (Signed)
[facility] called, transferred to [facility] to give report. Phone rang for 10 minutes with no answer, main number recalled x4 with no answer.

## 2015-10-10 NOTE — ED Provider Notes (Signed)
CSN: [id number]     Arrival date & time 10/10/15  0902 History   First MD Initiated Contact with Patient 10/10/15 0911     Chief Complaint  Patient presents with  . Shortness of Breath     (Consider location/radiation/quality/duration/timing/severity/associated sxs/prior Treatment) Patient is a 74 y.o. female presenting with shortness of breath. The history is provided by the patient. No language interpreter was used.  Shortness of Breath Severity:  Moderate Onset quality:  Gradual Duration:  2 days Timing:  Constant Progression:  Worsening Chronicity:  New Context: not URI   Relieved by:  None tried Worsened by:  Nothing tried Ineffective treatments:  None tried Associated symptoms: no headaches   Risk factors: no recent alcohol use and no obesity   Pt is a nursing home pt, has DNR.  Pt sent here by Dr. Willey Blade due to a cough and chest thightness.  Past Medical History  Diagnosis Date  . Osteoporosis   . Weakness   . Tachycardia   . Arthritis     KNEES AND HANDS  . Coarse tremors     TREMORS BOTH HANDS - PT STATES SIDE EFFECT OF HER MEDICATIONS FOR HER BREATHING  . Dizziness     sometimes  . Shortness of breath     USES OXYGEN 2 L / MIN NASAL CANNUA   24 HRS A DAY; LIVES IN [name]--CAN AMBULATE SHORT DISTANCE BUT ACTIVITIES VERY LIMITED BY SOB  . Anginal pain ([place])     "with fluttering, seen MD about it"  . History of shingles   . Emphysema   . GERD (gastroesophageal reflux disease)     sometimes  . Laryngitis     10/2013, hx of  . Pneumonia     hx of  . Bipolar 1 disorder ([place])   . Cough 06/29/14    pt states recent cough / cold - feeling better - cough now non-productive.  . Frequent urination   . COPD (chronic obstructive pulmonary disease) ([place])   . Cancer ([place])     bladder  . Anxiety   . Depression   . Edema   . Insomnia    Past Surgical History  Procedure Laterality Date  . Bladder surgery    . Cystoscopy  w/ retrogrades Bilateral 06/19/2013    Procedure: CYSTOSCOPY WITH BIOSPY AND FULGERATION, BILATERAL RETROGRADE PYELOGRAM;  Surgeon: Malka So, MD;  Location: [facility];  Service: Urology;  Laterality: Bilateral;  . Breast surgery Left     FOR TUMOR - BENIGN  . Cataract extraction Bilateral 3 years ago  . Cystoscopy w/ retrogrades Bilateral 12/25/2013    Procedure: CYSTOSCOPY WITH BILATERAL RETROGRADE WITH BIOPSY WITH FULGERATION;  Surgeon: Irine Seal, MD;  Location: [facility];  Service: Urology;  Laterality: Bilateral;  . Transurethral resection of bladder tumor Bilateral 12/25/2013    Procedure: TRANSURETHRAL RESECTION OF BLADDER TUMOR (TURBT);  Surgeon: Irine Seal, MD;  [facility]: [facility];  Service: Urology;  Laterality: Bilateral;  . Cystoscopy with retrograde pyelogram, ureteroscopy and stent placement Bilateral 07/02/2014    Procedure: CYSTOSCOPY WITH BILATERAL RETROGRADE PYELOGRAM;  Surgeon: Malka So, MD;  [facility]: [facility];  Service: Urology;  Laterality: Bilateral;  . Transurethral resection of bladder tumor N/A 07/02/2014    Procedure: TRANSURETHRAL RESECTION OF BLADDER TUMOR (TURBT);  Surgeon: Malka So, MD;  [facility];  Service: Urology;  Laterality: N/A;  . Bladder surgery  2015  . Breast cyst removal    .  Colonoscopy N/A 03/03/2015    RMR: Extensive colonic diverticulosis. Multiple colonic polyps-removed as described above. No overt neoplasm seen.   . Esophagogastroduodenoscopy N/A 03/03/2015    RMR: Noncritical. Schatzki's ring status post esophageal dilation as described above. hiatal hernia; otherwise normal EGD  . Maloney dilation N/A 03/03/2015    Procedure: Venia Minks DILATION;  Surgeon: Daneil Dolin, MD;  [facility];  Service: Endoscopy;  Laterality: N/A;  . Givens capsule study N/A 06/16/2015    Procedure: GIVENS CAPSULE STUDY;  Surgeon: Danie Binder, MD;  [facility];  Service: Endoscopy;  Laterality: N/A;  pt to arrive at 8:00 for 8:30 appt    Family History  Problem Relation Age of Onset  . Hypertension Mother   . Kidney failure Mother   . Cancer Father   . Asthma Father   . Heart failure Father   . Hypertension Father   . Colon cancer Father     diagnosed in his late 55, deceased from lung issues  . Colon cancer Brother     diagnosed in his late 41s, succumbed to the disease   Social History  Substance Use Topics  . Smoking status: Former Smoker -- 2.00 packs/day for 40 years    Types: Cigarettes    Quit date: 11/13/2000  . Smokeless tobacco: Never Used     Comment: QUIT SMOKING YRS AGO  . Alcohol Use: No   OB History    Gravida Para Term Preterm AB TAB SAB Ectopic Multiple Living   0 0 0 0 0 0 0 0       Review of Systems  Respiratory: Positive for shortness of breath.   Neurological: Negative for headaches.  All other systems reviewed and are negative.     Allergies  Review of patient's allergies indicates no known allergies.  Home Medications   Prior to Admission medications   Medication Sig Start Date End Date Taking? Authorizing Provider  albuterol (PROVENTIL) (2.5 MG/3ML) 0.083% nebulizer solution Take 2.5 mg by nebulization 3 (three) times daily.    Historical Provider, MD  alendronate (FOSAMAX) 70 MG tablet Take 70 mg by mouth every Friday. Take with a full glass of water on an empty stomach.    Historical Provider, MD  ALPRAZolam Duanne Moron) 1 MG tablet Take one tablet by mouth at bedtime for anxiety 03/23/15   Lauree Chandler, NP  diltiazem (TIAZAC) 240 MG 24 hr capsule Take 240 mg by mouth every morning.     Historical Provider, MD  ferrous sulfate 325 (65 FE) MG tablet Take 325 mg by mouth daily.    Historical Provider, MD  FLUoxetine (PROZAC) 20 MG capsule Take 20 mg by mouth daily.    Historical Provider, MD  fluticasone (FLONASE) 50 MCG/ACT nasal spray Place 2 sprays into the nose daily.    Historical Provider, MD  furosemide (LASIX) 40 MG tablet Take 1 tablet (40 mg total) by mouth every  morning. As needed for edema Patient taking differently: Take 40 mg by mouth See admin instructions. Take 1 tablet by mouth once daily at 1000.  May also take an extra tablet as needed for edema. 11/12/14   Sinda Du, MD  guaiFENesin (ROBITUSSIN) 100 MG/5ML liquid Take 200 mg by mouth 4 (four) times daily as needed for cough.    Historical Provider, MD  HYDROcodone-acetaminophen (NORCO/VICODIN) 5-325 MG tablet Take one tablet by mouth every 6 hours as needed for pain. Max APAP 3gm/24 hours from all sources 08/18/15  Estill Dooms, MD  Multiple Vitamin (MULTIVITAMIN WITH MINERALS) TABS tablet Take 1 tablet by mouth daily.    Historical Provider, MD  pantoprazole (PROTONIX) 40 MG tablet Take 1 tablet (40 mg total) by mouth 2 (two) times daily before a meal. 03/04/15   Sinda Du, MD  predniSONE (DELTASONE) 10 MG tablet Take 1 tablet (10 mg total) by mouth daily with breakfast. 03/21/15   Sinda Du, MD  risperiDONE (RISPERDAL) 0.5 MG tablet Take 0.5 mg by mouth at bedtime.    Historical Provider, MD  tiotropium (SPIRIVA) 18 MCG inhalation capsule Place 18 mcg into inhaler and inhale every morning.    Historical Provider, MD   BP 133/62 mmHg  Pulse 72  Temp(Src) 97.7 F (36.5 C) (Oral)  Resp 24  Wt 70.308 kg  SpO2 100%  LMP  (LMP Unknown) Physical Exam  Constitutional: She is oriented to person, place, and time. She appears well-developed and well-nourished.  HENT:  Head: Normocephalic.  Right Ear: External ear normal.  Left Ear: External ear normal.  Nose: Nose normal.  Mouth/Throat: Oropharynx is clear and moist.  Eyes: Conjunctivae and EOM are normal.  Neck: Normal range of motion.  Cardiovascular: Normal rate and regular rhythm.   Pulmonary/Chest: Effort normal.  Abdominal: Soft. She exhibits no distension.  Musculoskeletal: Normal range of motion.  Neurological: She is alert and oriented to person, place, and time.  Skin: Skin is warm.  Psychiatric: She has a normal  mood and affect.  Nursing note and vitals reviewed.   ED Course  Procedures (including critical care time) Labs Review Labs Reviewed  CBC WITH DIFFERENTIAL/PLATELET - Abnormal; Notable for the following:    WBC 11.3 (*)    RBC 3.44 (*)    Hemoglobin 10.5 (*)    HCT 34.7 (*)    MCV 100.9 (*)    RDW 17.1 (*)    Neutro Abs 9.8 (*)    All other components within normal limits  COMPREHENSIVE METABOLIC PANEL  TROPONIN I    Imaging Review Dg Chest Port 1 View  10/10/2015  CLINICAL DATA:  Mild difficulty breathing.  Nonproductive cough. EXAM: PORTABLE CHEST 1 VIEW COMPARISON:  Radiograph 08/13/2015 FINDINGS: Normal cardiac silhouette. There is mild basilar atelectasis. No effusion, infiltrate, pneumothorax. Remote RIGHT rib fracture. IMPRESSION: Mild basilar atelectasis and chronic bronchitic markings. No acute findings. Electronically Signed   By: Suzy Bouchard M.D.   On: 10/10/2015 10:44   I have personally reviewed and evaluated these images and lab results as part of my medical decision-making.   EKG Interpretation   Date/Time:  Sunday October 10 2015 09:47:14 EST Ventricular Rate:  71 PR Interval:  131 QRS Duration: 86 QT Interval:  375 QTC Calculation: 407 R Axis:   75 Text Interpretation:  Sinus rhythm Atrial premature complex since last  tracing no significant change Note: prior tracing was normal sinus rhythym  Confirmed by Eulis Foster  MD, ELLIOTT [id number]) on 10/10/2015 9:51:27 AM      MDM I doubt pe, negative ddimer.   Pt eating and drinking normally.   Final diagnoses:  Bronchitis    zithromax See your Physicain for recheck in 2-3 days    Fransico Meadow, PA-C 10/10/15 [place], MD 10/11/15 2038  [name], MD 10/11/15 2039

## 2015-10-11 ENCOUNTER — Other Ambulatory Visit (HOSPITAL_COMMUNITY): Payer: Self-pay

## 2015-10-11 DIAGNOSIS — D649 Anemia, unspecified: Secondary | ICD-10-CM

## 2015-10-13 ENCOUNTER — Encounter (HOSPITAL_BASED_OUTPATIENT_CLINIC_OR_DEPARTMENT_OTHER): Payer: Medicare Other

## 2015-10-13 ENCOUNTER — Encounter (HOSPITAL_COMMUNITY): Payer: Medicare Other | Attending: Oncology

## 2015-10-13 VITALS — BP 124/49 | HR 75 | Temp 98.1°F | Resp 20

## 2015-10-13 DIAGNOSIS — D500 Iron deficiency anemia secondary to blood loss (chronic): Secondary | ICD-10-CM | POA: Diagnosis not present

## 2015-10-13 DIAGNOSIS — D509 Iron deficiency anemia, unspecified: Secondary | ICD-10-CM | POA: Diagnosis not present

## 2015-10-13 DIAGNOSIS — D631 Anemia in chronic kidney disease: Secondary | ICD-10-CM | POA: Diagnosis not present

## 2015-10-13 DIAGNOSIS — N189 Chronic kidney disease, unspecified: Secondary | ICD-10-CM | POA: Diagnosis not present

## 2015-10-13 DIAGNOSIS — D649 Anemia, unspecified: Secondary | ICD-10-CM | POA: Diagnosis not present

## 2015-10-13 LAB — CBC WITH DIFFERENTIAL/PLATELET
BASOS ABS: 0 10*3/uL (ref 0.0–0.1)
BASOS PCT: 0 %
EOS ABS: 0.1 10*3/uL (ref 0.0–0.7)
EOS PCT: 1 %
HCT: 33.9 % — ABNORMAL LOW (ref 36.0–46.0)
Hemoglobin: 10.2 g/dL — ABNORMAL LOW (ref 12.0–15.0)
Lymphocytes Relative: 7 %
Lymphs Abs: 0.9 10*3/uL (ref 0.7–4.0)
MCH: 30.5 pg (ref 26.0–34.0)
MCHC: 30.1 g/dL (ref 30.0–36.0)
MCV: 101.5 fL — AB (ref 78.0–100.0)
MONO ABS: 0.4 10*3/uL (ref 0.1–1.0)
Monocytes Relative: 3 %
Neutro Abs: 10.8 10*3/uL — ABNORMAL HIGH (ref 1.7–7.7)
Neutrophils Relative %: 89 %
PLATELETS: 207 10*3/uL (ref 150–400)
RBC: 3.34 MIL/uL — AB (ref 3.87–5.11)
RDW: 17 % — AB (ref 11.5–15.5)
WBC: 12.2 10*3/uL — AB (ref 4.0–10.5)

## 2015-10-13 LAB — FERRITIN: FERRITIN: 122 ng/mL (ref 11–307)

## 2015-10-13 MED ORDER — DARBEPOETIN ALFA 60 MCG/0.3ML IJ SOSY
PREFILLED_SYRINGE | INTRAMUSCULAR | Status: AC
  Filled 2015-10-13: qty 0.3

## 2015-10-13 MED ORDER — DARBEPOETIN ALFA 60 MCG/0.3ML IJ SOSY
60.0000 ug | PREFILLED_SYRINGE | Freq: Once | INTRAMUSCULAR | Status: AC
  Administered 2015-10-13: 60 ug via SUBCUTANEOUS

## 2015-10-13 NOTE — Progress Notes (Signed)
April Pearson presents today for injection per MD orders. Aranesp 60 mcg administered SQ in right Abdomen. Administration without incident. Patient tolerated well.

## 2015-10-13 NOTE — Patient Instructions (Signed)
[place] at [facility] Discharge Instructions  RECOMMENDATIONS MADE BY THE CONSULTANT AND ANY TEST RESULTS WILL BE SENT TO YOUR REFERRING PHYSICIAN.  Hemoglobin 10.2. Aranesp 60 mcg injection given as ordered.  Return as scheduled.  Thank you for choosing [place] at [facility] to provide your oncology and hematology care.  To afford each patient quality time with our provider, please arrive at least 15 minutes before your scheduled appointment time.    You need to re-schedule your appointment should you arrive 10 or more minutes late.  We strive to give you quality time with our providers, and arriving late affects you and other patients whose appointments are after yours.  Also, if you no show three or more times for appointments you may be dismissed from the clinic at the providers discretion.     Again, thank you for choosing [facility].  Our hope is that these requests will decrease the amount of time that you wait before being seen by our physicians.       _____________________________________________________________  Should you have questions after your visit to [facility], please contact our office at [phone] between the hours of 8:30 a.m. and 4:30 p.m.  Voicemails left after 4:30 p.m. will not be returned until the following business day.  For prescription refill requests, have your pharmacy contact our office.

## 2015-10-14 NOTE — Progress Notes (Signed)
LABS DRAWN

## 2015-10-17 DIAGNOSIS — D649 Anemia, unspecified: Secondary | ICD-10-CM | POA: Diagnosis not present

## 2015-10-17 DIAGNOSIS — J449 Chronic obstructive pulmonary disease, unspecified: Secondary | ICD-10-CM | POA: Diagnosis not present

## 2015-10-19 ENCOUNTER — Encounter (HOSPITAL_COMMUNITY)
Admission: AD | Admit: 2015-10-19 | Discharge: 2015-10-19 | Disposition: A | Discharge status: Home / Self Care | Payer: Medicare Other | Source: Skilled Nursing Facility | Attending: Pulmonary Disease | Admitting: Pulmonary Disease

## 2015-10-19 DIAGNOSIS — Z029 Encounter for administrative examinations, unspecified: Secondary | ICD-10-CM | POA: Diagnosis not present

## 2015-10-19 LAB — LIPID PANEL
CHOL/HDL RATIO: 2.5 ratio
Cholesterol: 196 mg/dL (ref 0–200)
HDL: 77 mg/dL (ref >40)
LDL CALC: 100 mg/dL — AB (ref 0–99)
Triglycerides: 97 mg/dL (ref <150)
VLDL: 19 mg/dL (ref 0–40)

## 2015-10-20 LAB — HEMOGLOBIN A1C
HEMOGLOBIN A1C: 5.2 % (ref 4.8–5.6)
MEAN PLASMA GLUCOSE: 103 mg/dL

## 2015-10-25 NOTE — Progress Notes (Signed)
NAMEROMA, LUSSIER              ACCOUNT NO.:  [id number]  MEDICAL RECORD NO.:  [id number]  LOCATION:  S116                          FACILITY:  [facility]  PHYSICIAN:  Tangala Wiegert L. Luan Pulling, M.D.DATE OF BIRTH:  1941-03-02  DATE OF PROCEDURE:  10/17/2015 DATE OF DISCHARGE:  10/10/2015                                PROGRESS NOTE   Ms. Prusha is a resident at the skilled care facility with known problems with COPD, chronic anxiety, hypertension, chronic tachycardia, and she has been having increasing problems with shortness of breath.  She says her breathing is worse and she has been worse over the last several weeks.  No other new complaints.  She has been to the emergency department recently and started on Zithromax.  This is a documentation of my visit of December 4.  PHYSICAL EXAMINATION:  GENERAL:  She is overall I think better this morning.  She is still short of breath.  She is sitting in a wheelchair, on oxygen. CHEST:  Shows diminished breath sounds and prolonged expiratory phase. HEART:  Shows mild tachycardia, but regular. ABDOMEN:  Soft and she does not have edema.  ASSESSMENT:  She has end-stage chronic obstructive pulmonary disease with acute exacerbation.  She also has difficulty with anxiety and depression and chronic respiratory failure.  No changes in her treatment.     Klever Twyford L. Luan Pulling, M.D.     ELH/MEDQ  D:  10/23/2015  T:  10/23/2015  Job:  [id number]

## 2015-10-27 ENCOUNTER — Encounter (HOSPITAL_COMMUNITY): Payer: Medicare Other | Attending: Pulmonary Disease

## 2015-10-27 ENCOUNTER — Other Ambulatory Visit (HOSPITAL_COMMUNITY): Payer: Medicare Other

## 2015-10-27 DIAGNOSIS — I10 Essential (primary) hypertension: Secondary | ICD-10-CM | POA: Insufficient documentation

## 2015-10-27 DIAGNOSIS — D649 Anemia, unspecified: Secondary | ICD-10-CM | POA: Insufficient documentation

## 2015-11-06 DIAGNOSIS — N189 Chronic kidney disease, unspecified: Secondary | ICD-10-CM

## 2015-11-06 DIAGNOSIS — D631 Anemia in chronic kidney disease: Secondary | ICD-10-CM | POA: Insufficient documentation

## 2015-11-06 DIAGNOSIS — D500 Iron deficiency anemia secondary to blood loss (chronic): Secondary | ICD-10-CM | POA: Insufficient documentation

## 2015-11-10 ENCOUNTER — Ambulatory Visit (HOSPITAL_COMMUNITY)
Admit: 2015-11-10 | Discharge: 2015-11-10 | Disposition: A | Discharge status: Home / Self Care | Payer: Medicare Other | Attending: Internal Medicine | Admitting: Internal Medicine

## 2015-11-10 ENCOUNTER — Encounter (HOSPITAL_COMMUNITY): Payer: Medicare Other

## 2015-11-10 ENCOUNTER — Other Ambulatory Visit (HOSPITAL_COMMUNITY): Payer: Medicare Other

## 2015-11-10 DIAGNOSIS — Z8249 Family history of ischemic heart disease and other diseases of the circulatory system: Secondary | ICD-10-CM | POA: Diagnosis not present

## 2015-11-10 DIAGNOSIS — Z841 Family history of disorders of kidney and ureter: Secondary | ICD-10-CM | POA: Diagnosis not present

## 2015-11-10 DIAGNOSIS — F319 Bipolar disorder, unspecified: Secondary | ICD-10-CM | POA: Diagnosis not present

## 2015-11-10 DIAGNOSIS — J189 Pneumonia, unspecified organism: Secondary | ICD-10-CM | POA: Diagnosis not present

## 2015-11-10 DIAGNOSIS — J9602 Acute respiratory failure with hypercapnia: Secondary | ICD-10-CM | POA: Diagnosis not present

## 2015-11-10 DIAGNOSIS — Z7952 Long term (current) use of systemic steroids: Secondary | ICD-10-CM | POA: Diagnosis not present

## 2015-11-10 DIAGNOSIS — Z9981 Dependence on supplemental oxygen: Secondary | ICD-10-CM | POA: Diagnosis not present

## 2015-11-10 DIAGNOSIS — R4182 Altered mental status, unspecified: Secondary | ICD-10-CM | POA: Diagnosis not present

## 2015-11-10 DIAGNOSIS — Z87891 Personal history of nicotine dependence: Secondary | ICD-10-CM | POA: Diagnosis not present

## 2015-11-10 DIAGNOSIS — Z515 Encounter for palliative care: Secondary | ICD-10-CM | POA: Diagnosis not present

## 2015-11-10 DIAGNOSIS — N289 Disorder of kidney and ureter, unspecified: Secondary | ICD-10-CM | POA: Diagnosis not present

## 2015-11-10 DIAGNOSIS — Z66 Do not resuscitate: Secondary | ICD-10-CM | POA: Diagnosis not present

## 2015-11-10 DIAGNOSIS — J441 Chronic obstructive pulmonary disease with (acute) exacerbation: Secondary | ICD-10-CM | POA: Diagnosis not present

## 2015-11-10 DIAGNOSIS — I10 Essential (primary) hypertension: Secondary | ICD-10-CM | POA: Diagnosis not present

## 2015-11-10 DIAGNOSIS — F418 Other specified anxiety disorders: Secondary | ICD-10-CM | POA: Diagnosis not present

## 2015-11-10 DIAGNOSIS — E860 Dehydration: Secondary | ICD-10-CM | POA: Diagnosis not present

## 2015-11-10 DIAGNOSIS — Z800 Family history of malignant neoplasm of digestive organs: Secondary | ICD-10-CM | POA: Diagnosis not present

## 2015-11-10 DIAGNOSIS — R0902 Hypoxemia: Secondary | ICD-10-CM | POA: Diagnosis not present

## 2015-11-10 DIAGNOSIS — M810 Age-related osteoporosis without current pathological fracture: Secondary | ICD-10-CM | POA: Diagnosis not present

## 2015-11-10 DIAGNOSIS — Z79891 Long term (current) use of opiate analgesic: Secondary | ICD-10-CM | POA: Diagnosis not present

## 2015-11-10 DIAGNOSIS — J440 Chronic obstructive pulmonary disease with acute lower respiratory infection: Secondary | ICD-10-CM | POA: Diagnosis not present

## 2015-11-10 DIAGNOSIS — M1990 Unspecified osteoarthritis, unspecified site: Secondary | ICD-10-CM | POA: Diagnosis not present

## 2015-11-10 DIAGNOSIS — K219 Gastro-esophageal reflux disease without esophagitis: Secondary | ICD-10-CM | POA: Diagnosis not present

## 2015-11-10 DIAGNOSIS — Z825 Family history of asthma and other chronic lower respiratory diseases: Secondary | ICD-10-CM | POA: Diagnosis not present

## 2015-11-10 DIAGNOSIS — J449 Chronic obstructive pulmonary disease, unspecified: Secondary | ICD-10-CM | POA: Diagnosis not present

## 2015-11-10 DIAGNOSIS — R05 Cough: Secondary | ICD-10-CM | POA: Diagnosis not present

## 2015-11-10 DIAGNOSIS — R32 Unspecified urinary incontinence: Secondary | ICD-10-CM | POA: Diagnosis not present

## 2015-11-10 NOTE — Progress Notes (Deleted)
Opened in error

## 2015-11-11 ENCOUNTER — Encounter (HOSPITAL_COMMUNITY): Payer: Self-pay | Admitting: Emergency Medicine

## 2015-11-11 ENCOUNTER — Inpatient Hospital Stay (HOSPITAL_COMMUNITY)
Admission: EM | Admit: 2015-11-11 | Discharge: 2015-11-16 | DRG: 190 | Disposition: A | Discharge status: Skilled Nursing Facility | Payer: Medicare Other | Attending: Pulmonary Disease | Admitting: Pulmonary Disease

## 2015-11-11 ENCOUNTER — Inpatient Hospital Stay (HOSPITAL_COMMUNITY): Payer: Medicare Other

## 2015-11-11 ENCOUNTER — Emergency Department (HOSPITAL_COMMUNITY): Payer: Medicare Other

## 2015-11-11 DIAGNOSIS — Z7952 Long term (current) use of systemic steroids: Secondary | ICD-10-CM | POA: Diagnosis not present

## 2015-11-11 DIAGNOSIS — Y95 Nosocomial condition: Secondary | ICD-10-CM | POA: Diagnosis present

## 2015-11-11 DIAGNOSIS — Z66 Do not resuscitate: Secondary | ICD-10-CM | POA: Diagnosis not present

## 2015-11-11 DIAGNOSIS — I10 Essential (primary) hypertension: Secondary | ICD-10-CM

## 2015-11-11 DIAGNOSIS — F319 Bipolar disorder, unspecified: Secondary | ICD-10-CM | POA: Diagnosis present

## 2015-11-11 DIAGNOSIS — F418 Other specified anxiety disorders: Secondary | ICD-10-CM | POA: Diagnosis present

## 2015-11-11 DIAGNOSIS — R4182 Altered mental status, unspecified: Secondary | ICD-10-CM | POA: Diagnosis not present

## 2015-11-11 DIAGNOSIS — R32 Unspecified urinary incontinence: Secondary | ICD-10-CM | POA: Diagnosis present

## 2015-11-11 DIAGNOSIS — J189 Pneumonia, unspecified organism: Secondary | ICD-10-CM | POA: Diagnosis not present

## 2015-11-11 DIAGNOSIS — J9602 Acute respiratory failure with hypercapnia: Secondary | ICD-10-CM | POA: Diagnosis not present

## 2015-11-11 DIAGNOSIS — K219 Gastro-esophageal reflux disease without esophagitis: Secondary | ICD-10-CM | POA: Diagnosis present

## 2015-11-11 DIAGNOSIS — J440 Chronic obstructive pulmonary disease with acute lower respiratory infection: Principal | ICD-10-CM | POA: Diagnosis present

## 2015-11-11 DIAGNOSIS — J441 Chronic obstructive pulmonary disease with (acute) exacerbation: Secondary | ICD-10-CM | POA: Diagnosis present

## 2015-11-11 DIAGNOSIS — J9621 Acute and chronic respiratory failure with hypoxia: Secondary | ICD-10-CM | POA: Diagnosis not present

## 2015-11-11 DIAGNOSIS — E860 Dehydration: Secondary | ICD-10-CM | POA: Diagnosis present

## 2015-11-11 DIAGNOSIS — Z7189 Other specified counseling: Secondary | ICD-10-CM | POA: Diagnosis not present

## 2015-11-11 DIAGNOSIS — Z87891 Personal history of nicotine dependence: Secondary | ICD-10-CM | POA: Diagnosis not present

## 2015-11-11 DIAGNOSIS — Z79891 Long term (current) use of opiate analgesic: Secondary | ICD-10-CM

## 2015-11-11 DIAGNOSIS — J449 Chronic obstructive pulmonary disease, unspecified: Secondary | ICD-10-CM | POA: Diagnosis present

## 2015-11-11 DIAGNOSIS — Z9981 Dependence on supplemental oxygen: Secondary | ICD-10-CM | POA: Diagnosis not present

## 2015-11-11 DIAGNOSIS — Z825 Family history of asthma and other chronic lower respiratory diseases: Secondary | ICD-10-CM

## 2015-11-11 DIAGNOSIS — J9690 Respiratory failure, unspecified, unspecified whether with hypoxia or hypercapnia: Secondary | ICD-10-CM | POA: Diagnosis not present

## 2015-11-11 DIAGNOSIS — M1990 Unspecified osteoarthritis, unspecified site: Secondary | ICD-10-CM | POA: Diagnosis present

## 2015-11-11 DIAGNOSIS — N19 Unspecified kidney failure: Secondary | ICD-10-CM | POA: Diagnosis not present

## 2015-11-11 DIAGNOSIS — R0902 Hypoxemia: Secondary | ICD-10-CM

## 2015-11-11 DIAGNOSIS — R0602 Shortness of breath: Secondary | ICD-10-CM | POA: Diagnosis not present

## 2015-11-11 DIAGNOSIS — Z515 Encounter for palliative care: Secondary | ICD-10-CM | POA: Diagnosis not present

## 2015-11-11 DIAGNOSIS — Z8249 Family history of ischemic heart disease and other diseases of the circulatory system: Secondary | ICD-10-CM

## 2015-11-11 DIAGNOSIS — Z800 Family history of malignant neoplasm of digestive organs: Secondary | ICD-10-CM | POA: Diagnosis not present

## 2015-11-11 DIAGNOSIS — R402411 Glasgow coma scale score 13-15, in the field [EMT or ambulance]: Secondary | ICD-10-CM | POA: Diagnosis not present

## 2015-11-11 DIAGNOSIS — Z841 Family history of disorders of kidney and ureter: Secondary | ICD-10-CM

## 2015-11-11 DIAGNOSIS — M810 Age-related osteoporosis without current pathological fracture: Secondary | ICD-10-CM | POA: Diagnosis present

## 2015-11-11 DIAGNOSIS — N289 Disorder of kidney and ureter, unspecified: Secondary | ICD-10-CM | POA: Diagnosis present

## 2015-11-11 DIAGNOSIS — J9622 Acute and chronic respiratory failure with hypercapnia: Secondary | ICD-10-CM | POA: Diagnosis not present

## 2015-11-11 LAB — LACTIC ACID, PLASMA
Lactic Acid, Venous: 0.9 mmol/L (ref 0.5–2.0)
Lactic Acid, Venous: 0.9 mmol/L (ref 0.5–2.0)

## 2015-11-11 LAB — BLOOD GAS, ARTERIAL
Acid-Base Excess: 18.6 mmol/L — ABNORMAL HIGH (ref 0.0–2.0)
Bicarbonate: 40.1 mEq/L — ABNORMAL HIGH (ref 20.0–24.0)
DRAWN BY: 277331
O2 CONTENT: 4 L/min
O2 SAT: 95.4 %
PATIENT TEMPERATURE: 37
PO2 ART: 80.7 mmHg (ref 80.0–100.0)
pCO2 arterial: 88.2 mmHg (ref 35.0–45.0)
pH, Arterial: 7.331 — ABNORMAL LOW (ref 7.350–7.450)

## 2015-11-11 LAB — CBC WITH DIFFERENTIAL/PLATELET
BASOS PCT: 0 %
Basophils Absolute: 0 10*3/uL (ref 0.0–0.1)
EOS ABS: 0 10*3/uL (ref 0.0–0.7)
EOS PCT: 0 %
HEMATOCRIT: 35.5 % — AB (ref 36.0–46.0)
Hemoglobin: 11 g/dL — ABNORMAL LOW (ref 12.0–15.0)
Lymphocytes Relative: 3 %
Lymphs Abs: 0.7 10*3/uL (ref 0.7–4.0)
MCH: 32.5 pg (ref 26.0–34.0)
MCHC: 31 g/dL (ref 30.0–36.0)
MCV: 105 fL — ABNORMAL HIGH (ref 78.0–100.0)
MONO ABS: 0.4 10*3/uL (ref 0.1–1.0)
MONOS PCT: 2 %
Neutro Abs: 20.5 10*3/uL — ABNORMAL HIGH (ref 1.7–7.7)
Neutrophils Relative %: 95 %
PLATELETS: 298 10*3/uL (ref 150–400)
RBC: 3.38 MIL/uL — ABNORMAL LOW (ref 3.87–5.11)
RDW: 15.9 % — AB (ref 11.5–15.5)
WBC: 21.7 10*3/uL — ABNORMAL HIGH (ref 4.0–10.5)

## 2015-11-11 LAB — COMPREHENSIVE METABOLIC PANEL
ALBUMIN: 4 g/dL (ref 3.5–5.0)
ALT: 15 U/L (ref 14–54)
ANION GAP: 3 — AB (ref 5–15)
AST: 15 U/L (ref 15–41)
Alkaline Phosphatase: 72 U/L (ref 38–126)
BILIRUBIN TOTAL: 0.3 mg/dL (ref 0.3–1.2)
BUN: 46 mg/dL — ABNORMAL HIGH (ref 6–20)
CALCIUM: 8.9 mg/dL (ref 8.9–10.3)
CO2: 46 mmol/L — ABNORMAL HIGH (ref 22–32)
CREATININE: 1.35 mg/dL — AB (ref 0.44–1.00)
Chloride: 92 mmol/L — ABNORMAL LOW (ref 101–111)
GFR calc Af Amer: 44 mL/min — ABNORMAL LOW (ref >60)
GFR calc non Af Amer: 38 mL/min — ABNORMAL LOW (ref >60)
GLUCOSE: 182 mg/dL — AB (ref 65–99)
POTASSIUM: 4.3 mmol/L (ref 3.5–5.1)
SODIUM: 141 mmol/L (ref 135–145)
TOTAL PROTEIN: 6.8 g/dL (ref 6.5–8.1)

## 2015-11-11 LAB — URINALYSIS, ROUTINE W REFLEX MICROSCOPIC
Bilirubin Urine: NEGATIVE
Glucose, UA: NEGATIVE mg/dL
Hgb urine dipstick: NEGATIVE
KETONES UR: NEGATIVE mg/dL
LEUKOCYTES UA: NEGATIVE
NITRITE: NEGATIVE
PROTEIN: NEGATIVE mg/dL
Specific Gravity, Urine: 1.02 (ref 1.005–1.030)
pH: 5 (ref 5.0–8.0)

## 2015-11-11 LAB — I-STAT TROPONIN, ED: Troponin i, poc: 0 ng/mL (ref 0.00–0.08)

## 2015-11-11 LAB — MRSA PCR SCREENING: MRSA by PCR: POSITIVE — AB

## 2015-11-11 LAB — BRAIN NATRIURETIC PEPTIDE: B Natriuretic Peptide: 48 pg/mL (ref 0.0–100.0)

## 2015-11-11 MED ORDER — RISPERIDONE 0.5 MG PO TABS
0.5000 mg | ORAL_TABLET | Freq: Every day | ORAL | Status: DC
  Administered 2015-11-11 – 2015-11-15 (×4): 0.5 mg via ORAL
  Filled 2015-11-11 (×4): qty 1

## 2015-11-11 MED ORDER — SODIUM CHLORIDE 0.9 % IV SOLN
INTRAVENOUS | Status: DC
  Administered 2015-11-12 – 2015-11-13 (×2): via INTRAVENOUS
  Administered 2015-11-13: 1000 mL via INTRAVENOUS
  Administered 2015-11-14: 06:00:00 via INTRAVENOUS

## 2015-11-11 MED ORDER — DILTIAZEM HCL ER COATED BEADS 240 MG PO CP24
240.0000 mg | ORAL_CAPSULE | Freq: Every morning | ORAL | Status: DC
  Administered 2015-11-12 – 2015-11-16 (×5): 240 mg via ORAL
  Filled 2015-11-11 (×6): qty 1

## 2015-11-11 MED ORDER — FERROUS SULFATE 325 (65 FE) MG PO TABS
325.0000 mg | ORAL_TABLET | Freq: Every day | ORAL | Status: DC
  Administered 2015-11-12 – 2015-11-16 (×5): 325 mg via ORAL
  Filled 2015-11-11 (×5): qty 1

## 2015-11-11 MED ORDER — PANTOPRAZOLE SODIUM 40 MG PO TBEC
40.0000 mg | DELAYED_RELEASE_TABLET | Freq: Two times a day (BID) | ORAL | Status: DC
  Administered 2015-11-11 – 2015-11-16 (×9): 40 mg via ORAL
  Filled 2015-11-11 (×11): qty 1

## 2015-11-11 MED ORDER — FLUOXETINE HCL 20 MG PO CAPS
20.0000 mg | ORAL_CAPSULE | Freq: Every day | ORAL | Status: DC
  Administered 2015-11-12 – 2015-11-16 (×5): 20 mg via ORAL
  Filled 2015-11-11 (×6): qty 1

## 2015-11-11 MED ORDER — ONDANSETRON HCL 4 MG PO TABS
4.0000 mg | ORAL_TABLET | Freq: Four times a day (QID) | ORAL | Status: DC | PRN
  Administered 2015-11-14: 4 mg via ORAL
  Filled 2015-11-11: qty 1

## 2015-11-11 MED ORDER — ONDANSETRON HCL 4 MG/2ML IJ SOLN: 4.0000 mg | Freq: Four times a day (QID) | INTRAMUSCULAR | Status: DC | PRN

## 2015-11-11 MED ORDER — ALBUTEROL SULFATE (2.5 MG/3ML) 0.083% IN NEBU
2.5000 mg | INHALATION_SOLUTION | Freq: Three times a day (TID) | RESPIRATORY_TRACT | Status: DC
  Administered 2015-11-11 – 2015-11-13 (×7): 2.5 mg via RESPIRATORY_TRACT
  Filled 2015-11-11 (×9): qty 3

## 2015-11-11 MED ORDER — LEVOFLOXACIN IN D5W 500 MG/100ML IV SOLN
500.0000 mg | INTRAVENOUS | Status: DC
  Administered 2015-11-11: 500 mg via INTRAVENOUS
  Filled 2015-11-11: qty 100

## 2015-11-11 MED ORDER — METHYLPREDNISOLONE SODIUM SUCC 125 MG IJ SOLR
80.0000 mg | Freq: Two times a day (BID) | INTRAMUSCULAR | Status: DC
Start: 2015-11-11 — End: 2015-11-11

## 2015-11-11 MED ORDER — ALPRAZOLAM 0.5 MG PO TABS
1.0000 mg | ORAL_TABLET | Freq: Three times a day (TID) | ORAL | Status: DC | PRN
  Administered 2015-11-13: 1 mg via ORAL
  Filled 2015-11-11: qty 2

## 2015-11-11 MED ORDER — LEVOFLOXACIN IN D5W 500 MG/100ML IV SOLN: 500.0000 mg | INTRAVENOUS | Status: DC

## 2015-11-11 MED ORDER — ENOXAPARIN SODIUM 30 MG/0.3ML ~~LOC~~ SOLN: 30.0000 mg | SUBCUTANEOUS | Status: DC

## 2015-11-11 MED ORDER — NALOXONE HCL 0.4 MG/ML IJ SOLN
0.4000 mg | Freq: Once | INTRAMUSCULAR | Status: AC
  Administered 2015-11-11: 0.4 mg via INTRAVENOUS
  Filled 2015-11-11: qty 1

## 2015-11-11 MED ORDER — VANCOMYCIN HCL 10 G IV SOLR
1250.0000 mg | Freq: Once | INTRAVENOUS | Status: AC
  Administered 2015-11-11: 1250 mg via INTRAVENOUS
  Filled 2015-11-11: qty 1250

## 2015-11-11 MED ORDER — IPRATROPIUM-ALBUTEROL 0.5-2.5 (3) MG/3ML IN SOLN
3.0000 mL | RESPIRATORY_TRACT | Status: DC
  Administered 2015-11-11: 3 mL via RESPIRATORY_TRACT
  Filled 2015-11-11: qty 3

## 2015-11-11 MED ORDER — ENOXAPARIN SODIUM 40 MG/0.4ML ~~LOC~~ SOLN
40.0000 mg | SUBCUTANEOUS | Status: DC
  Administered 2015-11-11 – 2015-11-15 (×5): 40 mg via SUBCUTANEOUS
  Filled 2015-11-11 (×5): qty 0.4

## 2015-11-11 MED ORDER — PANTOPRAZOLE SODIUM 40 MG PO TBEC: 40.0000 mg | DELAYED_RELEASE_TABLET | Freq: Two times a day (BID) | ORAL | Status: DC

## 2015-11-11 MED ORDER — PIPERACILLIN-TAZOBACTAM 3.375 G IVPB 30 MIN
3.3750 g | Freq: Once | INTRAVENOUS | Status: AC
  Administered 2015-11-11: 3.375 g via INTRAVENOUS
  Filled 2015-11-11: qty 50

## 2015-11-11 MED ORDER — FUROSEMIDE 40 MG PO TABS
40.0000 mg | ORAL_TABLET | Freq: Every morning | ORAL | Status: DC
  Administered 2015-11-12 – 2015-11-16 (×5): 40 mg via ORAL
  Filled 2015-11-11 (×6): qty 1

## 2015-11-11 MED ORDER — SODIUM CHLORIDE 0.9 % IJ SOLN
3.0000 mL | Freq: Two times a day (BID) | INTRAMUSCULAR | Status: DC
  Administered 2015-11-11 – 2015-11-16 (×6): 3 mL via INTRAVENOUS

## 2015-11-11 MED ORDER — METHYLPREDNISOLONE SODIUM SUCC 125 MG IJ SOLR
80.0000 mg | Freq: Two times a day (BID) | INTRAMUSCULAR | Status: DC
  Administered 2015-11-11 – 2015-11-14 (×6): 80 mg via INTRAVENOUS
  Filled 2015-11-11 (×6): qty 2

## 2015-11-11 MED ORDER — FLUOXETINE HCL 20 MG PO CAPS: 20.0000 mg | ORAL_CAPSULE | Freq: Every day | ORAL | Status: DC

## 2015-11-11 MED ORDER — GUAIFENESIN 100 MG/5ML PO SOLN: 300.0000 mg | Freq: Four times a day (QID) | ORAL | Status: DC | PRN

## 2015-11-11 MED ORDER — FLUTICASONE PROPIONATE 50 MCG/ACT NA SUSP
2.0000 | Freq: Every day | NASAL | Status: DC
  Administered 2015-11-12 – 2015-11-16 (×5): 2 via NASAL
  Filled 2015-11-11: qty 16

## 2015-11-11 NOTE — H&P (Signed)
[facility] History and Physical  April Pearson [id number] DOB: 1941-09-13 DOA: 11/11/2015  Referring physician: ER PCP: Alonza Bogus, MD   Chief Complaint: Dyspnea. Altered mental status.  HPI: April Pearson is a 74 y.o. female  This is a 74 year old lady who comes from the skilled nursing facility. She describes dyspnea. She is currently on BiPAP machine and somewhat appears to be drowsy. She is unable to give me any clear history. The history was obtained from the medical record. She apparently had been complaining of dyspnea today. She has underlying COPD. When she came to the emergency room, she was in acute respiratory failure and has required BiPAP. She is now being admitted for further management.   Review of Systems:  Unable to obtain secondary to patient's altered mental status.  Past Medical History  Diagnosis Date  . Osteoporosis   . Weakness   . Tachycardia   . Arthritis     KNEES AND HANDS  . Coarse tremors     TREMORS BOTH HANDS - PT STATES SIDE EFFECT OF HER MEDICATIONS FOR HER BREATHING  . Dizziness     sometimes  . Shortness of breath     USES OXYGEN 2 L / MIN NASAL CANNUA   24 HRS A DAY; LIVES IN [name]--CAN AMBULATE SHORT DISTANCE BUT ACTIVITIES VERY LIMITED BY SOB  . Anginal pain ([place])     "with fluttering, seen MD about it"  . History of shingles   . Emphysema   . GERD (gastroesophageal reflux disease)     sometimes  . Laryngitis     10/2013, hx of  . Pneumonia     hx of  . Bipolar 1 disorder ([place])   . Cough 06/29/14    pt states recent cough / cold - feeling better - cough now non-productive.  . Frequent urination   . COPD (chronic obstructive pulmonary disease) ([place])   . Cancer ([place])     bladder  . Anxiety   . Depression   . Edema   . Insomnia    Past Surgical History  Procedure Laterality Date  . Bladder surgery    . Cystoscopy w/ retrogrades Bilateral 06/19/2013    Procedure:  CYSTOSCOPY WITH BIOSPY AND FULGERATION, BILATERAL RETROGRADE PYELOGRAM;  Surgeon: Malka So, MD;  Location: [facility];  Service: Urology;  Laterality: Bilateral;  . Breast surgery Left     FOR TUMOR - BENIGN  . Cataract extraction Bilateral 3 years ago  . Cystoscopy w/ retrogrades Bilateral 12/25/2013    Procedure: CYSTOSCOPY WITH BILATERAL RETROGRADE WITH BIOPSY WITH FULGERATION;  Surgeon: Irine Seal, MD;  Location: [facility];  Service: Urology;  Laterality: Bilateral;  . Transurethral resection of bladder tumor Bilateral 12/25/2013    Procedure: TRANSURETHRAL RESECTION OF BLADDER TUMOR (TURBT);  Surgeon: Irine Seal, MD;  [facility];  Service: Urology;  Laterality: Bilateral;  . Cystoscopy with retrograde pyelogram, ureteroscopy and stent placement Bilateral 07/02/2014    Procedure: CYSTOSCOPY WITH BILATERAL RETROGRADE PYELOGRAM;  Surgeon: Malka So, MD;  [facility]: [facility];  Service: Urology;  Laterality: Bilateral;  . Transurethral resection of bladder tumor N/A 07/02/2014    Procedure: TRANSURETHRAL RESECTION OF BLADDER TUMOR (TURBT);  Surgeon: Malka So, MD;  [facility];  Service: Urology;  Laterality: N/A;  . Bladder surgery  2015  . Breast cyst removal    . Colonoscopy N/A 03/03/2015    RMR: Extensive colonic diverticulosis. Multiple colonic polyps-removed as described  above. No overt neoplasm seen.   . Esophagogastroduodenoscopy N/A 03/03/2015    RMR: Noncritical. Schatzki's ring status post esophageal dilation as described above. hiatal hernia; otherwise normal EGD  . Maloney dilation N/A 03/03/2015    Procedure: Venia Minks DILATION;  Surgeon: Daneil Dolin, MD;  [facility];  Service: Endoscopy;  Laterality: N/A;  . Givens capsule study N/A 06/16/2015    Procedure: GIVENS CAPSULE STUDY;  Surgeon: Danie Binder, MD;  [facility];  Service: Endoscopy;  Laterality: N/A;  pt to arrive at 8:00 for 8:30 appt   Social History:  reports that she quit smoking about  15 years ago. Her smoking use included Cigarettes. She has a 80 pack-year smoking history. She has never used smokeless tobacco. She reports that she does not drink alcohol or use illicit drugs.  No Known Allergies  Family History  Problem Relation Age of Onset  . Hypertension Mother   . Kidney failure Mother   . Cancer Father   . Asthma Father   . Heart failure Father   . Hypertension Father   . Colon cancer Father     diagnosed in his late 84, deceased from lung issues  . Colon cancer Brother     diagnosed in his late 34s, succumbed to the disease      Prior to Admission medications   Medication Sig Start Date End Date Taking? Authorizing Provider  albuterol (PROVENTIL) (2.5 MG/3ML) 0.083% nebulizer solution Take 2.5 mg by nebulization 3 (three) times daily.   Yes Historical Provider, MD  alendronate (FOSAMAX) 70 MG tablet Take 70 mg by mouth every Friday. Take with a full glass of water on an empty stomach.   Yes Historical Provider, MD  ALPRAZolam Duanne Moron) 1 MG tablet Take one tablet by mouth at bedtime for anxiety Patient taking differently: Take 1 mg by mouth 3 (three) times daily as needed for anxiety. Take one tablet by mouth at bedtime for anxiety 03/23/15  Yes Lauree Chandler, NP  diltiazem (TIAZAC) 240 MG 24 hr capsule Take 240 mg by mouth every morning.    Yes Historical Provider, MD  ferrous sulfate 325 (65 FE) MG tablet Take 325 mg by mouth daily.   Yes Historical Provider, MD  FLUoxetine (PROZAC) 20 MG capsule Take 20 mg by mouth daily.   Yes Historical Provider, MD  fluticasone (FLONASE) 50 MCG/ACT nasal spray Place 2 sprays into the nose daily.   Yes Historical Provider, MD  furosemide (LASIX) 40 MG tablet Take 1 tablet (40 mg total) by mouth every morning. As needed for edema Patient taking differently: Take 40 mg by mouth daily.  11/12/14  Yes Sinda Du, MD  guaiFENesin (ROBITUSSIN) 100 MG/5ML liquid Take 300 mg by mouth every 6 (six) hours as needed for cough.     Yes Historical Provider, MD  HYDROcodone-acetaminophen (NORCO/VICODIN) 5-325 MG tablet Take one tablet by mouth every 6 hours as needed for pain. Max APAP 3gm/24 hours from all sources 08/18/15  Yes Estill Dooms, MD  magnesium hydroxide (MILK OF MAGNESIA) 400 MG/5ML suspension Take 30 mLs by mouth daily as needed for mild constipation.   Yes Historical Provider, MD  Multiple Vitamin (MULTIVITAMIN WITH MINERALS) TABS tablet Take 1 tablet by mouth daily.   Yes Historical Provider, MD  pantoprazole (PROTONIX) 40 MG tablet Take 1 tablet (40 mg total) by mouth 2 (two) times daily before a meal. 03/04/15  Yes Sinda Du, MD  predniSONE (DELTASONE) 10 MG tablet Take  1 tablet (10 mg total) by mouth daily with breakfast. 03/21/15  Yes Sinda Du, MD  risperiDONE (RISPERDAL) 0.5 MG tablet Take 0.5 mg by mouth at bedtime.   Yes Historical Provider, MD  tiotropium (SPIRIVA) 18 MCG inhalation capsule Place 18 mcg into inhaler and inhale every morning.   Yes Historical Provider, MD   Physical Exam: Filed Vitals:   11/11/15 1545 11/11/15 1600 11/11/15 1630 11/11/15 1642  BP:  157/115 130/85 130/85  Pulse:  103 102 105  Temp:      TempSrc:      Resp:  34 33 31  Weight:      SpO2: 96% 91% 93% 91%    Wt Readings from Last 3 Encounters:  11/11/15 70.308 kg (155 lb)  10/10/15 70.308 kg (155 lb)  09/23/15 70.67 kg (155 lb 12.8 oz)    General:  Appears somewhat dehydrated. She is on BiPAP. She appears drowsy. Eyes: PERRL, normal lids, irises & conjunctiva ENT: grossly normal hearing, lips & tongue Neck: no LAD, masses or thyromegaly Cardiovascular: RRR, no m/r/g. No LE edema. Telemetry: SR, no arrhythmias  Respiratory: Poor air entry bilaterally with no wheezing or crackles. Abdomen: soft, ntnd Skin: no rash or induration seen on limited exam Musculoskeletal: grossly normal tone BUE/BLE Psychiatric: Not examined. Neurologic: grossly non-focal.          Labs on Admission:  Basic Metabolic  Panel:  Recent Labs Lab 11/11/15 1247  NA 141  K 4.3  CL 92*  CO2 46*  GLUCOSE 182*  BUN 46*  CREATININE 1.35*  CALCIUM 8.9   Liver Function Tests:  Recent Labs Lab 11/11/15 1247  AST 15  ALT 15  ALKPHOS 72  BILITOT 0.3  PROT 6.8  ALBUMIN 4.0   No results for input(s): LIPASE, AMYLASE in the last 168 hours. No results for input(s): AMMONIA in the last 168 hours. CBC:  Recent Labs Lab 11/11/15 1247  WBC 21.7*  NEUTROABS 20.5*  HGB 11.0*  HCT 35.5*  MCV 105.0*  PLT 298   Cardiac Enzymes: No results for input(s): CKTOTAL, CKMB, CKMBINDEX, TROPONINI in the last 168 hours.  BNP (last 3 results)  Recent Labs  03/01/15 1400 11/11/15 1247  BNP 85.0 48.0    ProBNP (last 3 results) No results for input(s): PROBNP in the last 8760 hours.  CBG: No results for input(s): GLUCAP in the last 168 hours.  Radiological Exams on Admission: Dg Chest 1 View  11/10/2015  CLINICAL DATA:  Productive cough. EXAM: CHEST 1 VIEW COMPARISON:  10/10/2015 .  03/14/2015. FINDINGS: Mediastinum and hilar structures are normal. Cardiomegaly with normal pulmonary vascularity. Mild interstitial prominence noted bilaterally with subtle Kerley B-lines. A very mild component of congestive heart failure cannot be excluded . No overt pulmonary alveolar infiltrate. Small left pleural effusion. No pneumothorax . IMPRESSION: 1. Cardiomegaly with very subtle bilateral interstitial prominence with subtle Kerley B-lines. Very mild changes of congestive heart failure cannot be excluded. Tiny left pleural effusion cannot be excluded . 2. No focal alveolar infiltrate. Electronically Signed   By: Marcello Moores  Register   On: 11/10/2015 14:59   Dg Chest Portable 1 View  11/11/2015  CLINICAL DATA:  Shortness of breath, congestion. EXAM: PORTABLE CHEST 1 VIEW COMPARISON:  11/10/2015 FINDINGS: Heart is borderline in size. Left basilar opacity likely reflects atelectasis. No confluent opacity on the right. No  visible effusions or acute bony abnormality. Aortic calcifications in the arch. IMPRESSION: Borderline heart size. Left base opacity likely reflects atelectasis. Electronically Signed  By: Rolm Baptise M.D.   On: 11/11/2015 11:42      Assessment/Plan   1. COPD exacerbation. This has resulted in respiratory failure and now patient requires BiPAP. She will be treated with IV steroids and bronchodilators, as well as antibiotics. 2. Acute respiratory failure with hypercapnia. This is secondary to #1. 3. Acute renal insufficiency. She does appear to be clinically dehydrated and she'll be given IV fluids. 4. Altered mental status. This may be just related to her type II respiratory failure but I will obtain CT brain scan to make sure there is no acute brain event. 5. Hypertension. Stable.  She will be admitted to the stepdown unit. Further recommendations will depend on patient's hospital progress.   Code Status: Full code.  DVT Prophylaxis: Lovenox.  Family Communication: No family members available to discuss the plan.   Disposition Plan: Back to the skilled nursing facility when medically stable.   Time spent: 60 minutes.  Doree Albee [facility] Pager [phone].

## 2015-11-11 NOTE — ED Notes (Signed)
Blood drawn for lactic acid by lab at this time

## 2015-11-11 NOTE — Progress Notes (Signed)
**Note De-Identified  Obfuscation** Patient tolerated transport to CT and ICU

## 2015-11-11 NOTE — ED Notes (Signed)
Pt's o2 sats noted to have dropped to 70's , Pt noted to be leaning over some, audible gurgling upper airway. Dr Dayna Barker into room at this time. Pt woke up , encouraged to cough and take some deep breaths, O2 sat up to 92% . O2 via [place] continues at 4l/min

## 2015-11-11 NOTE — ED Notes (Signed)
Attempted to call report to ICU.
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
## 2015-11-11 NOTE — ED Notes (Signed)
No change in pt post narcan... remaining sleepy

## 2015-11-12 DIAGNOSIS — J441 Chronic obstructive pulmonary disease with (acute) exacerbation: Secondary | ICD-10-CM

## 2015-11-12 DIAGNOSIS — Z515 Encounter for palliative care: Secondary | ICD-10-CM | POA: Diagnosis not present

## 2015-11-12 DIAGNOSIS — J9602 Acute respiratory failure with hypercapnia: Secondary | ICD-10-CM

## 2015-11-12 DIAGNOSIS — Z7189 Other specified counseling: Secondary | ICD-10-CM | POA: Diagnosis not present

## 2015-11-12 LAB — BLOOD GAS, ARTERIAL
Acid-Base Excess: 18.5 mmol/L — ABNORMAL HIGH (ref 0.0–2.0)
Bicarbonate: 40.6 mEq/L — ABNORMAL HIGH (ref 20.0–24.0)
DELIVERY SYSTEMS: POSITIVE
DRAWN BY: 21310
EXPIRATORY PAP: 6
FIO2: 60
INSPIRATORY PAP: 14
O2 Saturation: 98.2 %
PO2 ART: 115 mmHg — AB (ref 80.0–100.0)
TCO2: 13.5 mmol/L (ref 0–100)
pCO2 arterial: 87.7 mmHg (ref 35.0–45.0)
pH, Arterial: 7.333 — ABNORMAL LOW (ref 7.350–7.450)

## 2015-11-12 LAB — CBC
HEMATOCRIT: 36.3 % (ref 36.0–46.0)
HEMOGLOBIN: 10.8 g/dL — AB (ref 12.0–15.0)
MCH: 31.2 pg (ref 26.0–34.0)
MCHC: 29.8 g/dL — AB (ref 30.0–36.0)
MCV: 104.9 fL — AB (ref 78.0–100.0)
Platelets: 204 10*3/uL (ref 150–400)
RBC: 3.46 MIL/uL — ABNORMAL LOW (ref 3.87–5.11)
RDW: 15.8 % — AB (ref 11.5–15.5)
WBC: 40.4 10*3/uL — ABNORMAL HIGH (ref 4.0–10.5)

## 2015-11-12 LAB — COMPREHENSIVE METABOLIC PANEL
ALBUMIN: 3.4 g/dL — AB (ref 3.5–5.0)
ALT: 13 U/L — ABNORMAL LOW (ref 14–54)
ANION GAP: 8 (ref 5–15)
AST: 17 U/L (ref 15–41)
Alkaline Phosphatase: 73 U/L (ref 38–126)
BILIRUBIN TOTAL: 0.5 mg/dL (ref 0.3–1.2)
BUN: 47 mg/dL — AB (ref 6–20)
CHLORIDE: 95 mmol/L — AB (ref 101–111)
CO2: 42 mmol/L — AB (ref 22–32)
Calcium: 8.9 mg/dL (ref 8.9–10.3)
Creatinine, Ser: 1.33 mg/dL — ABNORMAL HIGH (ref 0.44–1.00)
GFR calc Af Amer: 44 mL/min — ABNORMAL LOW (ref >60)
GFR calc non Af Amer: 38 mL/min — ABNORMAL LOW (ref >60)
GLUCOSE: 163 mg/dL — AB (ref 65–99)
POTASSIUM: 4.8 mmol/L (ref 3.5–5.1)
SODIUM: 145 mmol/L (ref 135–145)
TOTAL PROTEIN: 6.3 g/dL — AB (ref 6.5–8.1)

## 2015-11-12 LAB — LACTIC ACID, PLASMA: LACTIC ACID, VENOUS: 1.2 mmol/L (ref 0.5–2.0)

## 2015-11-12 MED ORDER — CHLORHEXIDINE GLUCONATE CLOTH 2 % EX PADS
6.0000 | MEDICATED_PAD | Freq: Every day | CUTANEOUS | Status: DC
  Administered 2015-11-12 – 2015-11-16 (×4): 6 via TOPICAL

## 2015-11-12 MED ORDER — MORPHINE SULFATE (CONCENTRATE) 10 MG/0.5ML PO SOLN: 5.0000 mg | ORAL | Status: DC | PRN

## 2015-11-12 MED ORDER — MUPIROCIN 2 % EX OINT
1.0000 "application " | TOPICAL_OINTMENT | Freq: Two times a day (BID) | CUTANEOUS | Status: DC
  Administered 2015-11-12 – 2015-11-16 (×9): 1 via NASAL
  Filled 2015-11-12: qty 22

## 2015-11-12 MED ORDER — LEVOFLOXACIN IN D5W 750 MG/150ML IV SOLN
750.0000 mg | INTRAVENOUS | Status: DC
  Administered 2015-11-13: 750 mg via INTRAVENOUS
  Filled 2015-11-12 (×2): qty 150

## 2015-11-12 NOTE — Progress Notes (Signed)
Subjective: She was admitted with acute on chronic respiratory failure. She is on BiPAP. She's had altered mental status and head CT that was negative. She is still tachypneic on BiPAP. At baseline she has severe COPD and is on bronchodilators and oxygen and still generally intensely symptomatic.  Objective: Vital signs in last 24 hours: Temp:  [97.2 F (36.2 C)-99.3 F (37.4 C)] 97.2 F (36.2 C) (12/30 0428) Pulse Rate:  [68-105] 78 (12/30 0551) Resp:  [15-35] 15 (12/30 0551) BP: (103-157)/(45-115) 120/57 mmHg (12/30 0500) SpO2:  [76 %-100 %] 96 % (12/30 0551) FiO2 (%):  [60 %] 60 % (12/29 2047) Weight:  [70.308 kg (155 lb)-72 kg (158 lb 11.7 oz)] 72 kg (158 lb 11.7 oz) (12/30 0100) Weight change:     Intake/Output from previous day: 12/29 0701 - 12/30 0700 In: 825 [I.V.:825] Out: 800 [Urine:800]  PHYSICAL EXAM General appearance: Poorly responsive, on BiPAP with respiratory rate still in the 20s Resp: rhonchi bilaterally and wheezes bilaterally Cardio: regular rate and rhythm, S1, S2 normal, no murmur, click, rub or gallop GI: soft, non-tender; bowel sounds normal; no masses,  no organomegaly Extremities: extremities normal, atraumatic, no cyanosis or edema  Lab Results:  Results for orders placed or performed during the hospital encounter of 11/11/15 (from the past 48 hour(s))  CBC with Differential     Status: Abnormal   Collection Time: 11/11/15 12:47 PM  Result Value Ref Range   WBC 21.7 (H) 4.0 - 10.5 K/uL   RBC 3.38 (Pearson) 3.87 - 5.11 MIL/uL   Hemoglobin 11.0 (Pearson) 12.0 - 15.0 g/dL   HCT 35.5 (Pearson) 36.0 - 46.0 %   MCV 105.0 (H) 78.0 - 100.0 fL   MCH 32.5 26.0 - 34.0 pg   MCHC 31.0 30.0 - 36.0 g/dL   RDW 15.9 (H) 11.5 - 15.5 %   Platelets 298 150 - 400 K/uL   Neutrophils Relative % 95 %   Neutro Abs 20.5 (H) 1.7 - 7.7 K/uL   Lymphocytes Relative 3 %   Lymphs Abs 0.7 0.7 - 4.0 K/uL   Monocytes Relative 2 %   Monocytes Absolute 0.4 0.1 - 1.0 K/uL   Eosinophils  Relative 0 %   Eosinophils Absolute 0.0 0.0 - 0.7 K/uL   Basophils Relative 0 %   Basophils Absolute 0.0 0.0 - 0.1 K/uL  Comprehensive metabolic panel     Status: Abnormal   Collection Time: 11/11/15 12:47 PM  Result Value Ref Range   Sodium 141 135 - 145 mmol/Pearson   Potassium 4.3 3.5 - 5.1 mmol/Pearson   Chloride 92 (Pearson) 101 - 111 mmol/Pearson   CO2 46 (H) 22 - 32 mmol/Pearson   Glucose, Bld 182 (H) 65 - 99 mg/dL   BUN 46 (H) 6 - 20 mg/dL   Creatinine, Ser 1.35 (H) 0.44 - 1.00 mg/dL   Calcium 8.9 8.9 - 10.3 mg/dL   Total Protein 6.8 6.5 - 8.1 g/dL   Albumin 4.0 3.5 - 5.0 g/dL   AST 15 15 - 41 U/Pearson   ALT 15 14 - 54 U/Pearson   Alkaline Phosphatase 72 38 - 126 U/Pearson   Total Bilirubin 0.3 0.3 - 1.2 mg/dL   GFR calc non Af Amer 38 (Pearson) >60 mL/min   GFR calc Af Amer 44 (Pearson) >60 mL/min    Comment: (NOTE) The eGFR has been calculated using the CKD EPI equation. This calculation has not been validated in all clinical situations. eGFR's persistently <60 mL/min signify possible Chronic Kidney  Disease.    Anion gap 3 (Pearson) 5 - 15  Brain natriuretic peptide     Status: None   Collection Time: 11/11/15 12:47 PM  Result Value Ref Range   B Natriuretic Peptide 48.0 0.0 - 100.0 pg/mL  Lactic acid, plasma     Status: None   Collection Time: 11/11/15  1:03 PM  Result Value Ref Range   Lactic Acid, Venous 0.9 0.5 - 2.0 mmol/Pearson  I-stat troponin, ED     Status: None   Collection Time: 11/11/15  1:12 PM  Result Value Ref Range   Troponin i, poc 0.00 0.00 - 0.08 ng/mL   Comment 3            Comment: Due to the release kinetics of cTnI, a negative result within the first hours of the onset of symptoms does not rule out myocardial infarction with certainty. If myocardial infarction is still suspected, repeat the test at appropriate intervals.   Lactic acid, plasma     Status: None   Collection Time: 11/11/15  3:27 PM  Result Value Ref Range   Lactic Acid, Venous 0.9 0.5 - 2.0 mmol/Pearson  Blood gas, arterial     Status:  Abnormal   Collection Time: 11/11/15  3:30 PM  Result Value Ref Range   O2 Content 4.0 Pearson/min   Delivery systems NASAL CANNULA    pH, Arterial 7.331 (Pearson) 7.350 - 7.450   pCO2 arterial 88.2 (HH) 35.0 - 45.0 mmHg    Comment: CRITICAL RESULT CALLED TO, READ BACK BY AND VERIFIED WITH: EMMA JENNINGS,RN AT 1600 BY WENDY VIA,RRT,RCP ON 11/11/2015    pO2, Arterial 80.7 80.0 - 100.0 mmHg   Bicarbonate 40.1 (H) 20.0 - 24.0 mEq/Pearson   Acid-Base Excess 18.6 (H) 0.0 - 2.0 mmol/Pearson   O2 Saturation 95.4 %   Patient temperature 37.0    Collection site RIGHT RADIAL    Drawn by [id number]    Sample type ARTERIAL DRAW    Allens test (pass/fail) PASS PASS  Urinalysis, Routine w reflex microscopic (not at [facility])     Status: None   Collection Time: 11/11/15  8:15 PM  Result Value Ref Range   Color, Urine YELLOW YELLOW   APPearance CLEAR CLEAR   Specific Gravity, Urine 1.020 1.005 - 1.030   pH 5.0 5.0 - 8.0   Glucose, UA NEGATIVE NEGATIVE mg/dL   Hgb urine dipstick NEGATIVE NEGATIVE   Bilirubin Urine NEGATIVE NEGATIVE   Ketones, ur NEGATIVE NEGATIVE mg/dL   Protein, ur NEGATIVE NEGATIVE mg/dL   Nitrite NEGATIVE NEGATIVE   Leukocytes, UA NEGATIVE NEGATIVE    Comment: MICROSCOPIC NOT DONE ON URINES WITH NEGATIVE PROTEIN, BLOOD, LEUKOCYTES, NITRITE, OR GLUCOSE <1000 mg/dL.  MRSA PCR Screening     Status: Abnormal   Collection Time: 11/11/15  8:17 PM  Result Value Ref Range   MRSA by PCR POSITIVE (A) NEGATIVE    Comment:        The GeneXpert MRSA Assay (FDA approved for NASAL specimens only), is one component of a comprehensive MRSA colonization surveillance program. It is not intended to diagnose MRSA infection nor to guide or monitor treatment for MRSA infections. RESULT CALLED TO, READ BACK BY AND VERIFIED WITH: DANIELS,J ON 11/11/15 AT 2205 BY LOY,C   Comprehensive metabolic panel     Status: Abnormal   Collection Time: 11/12/15  4:38 AM  Result Value Ref Range   Sodium 145 135 - 145 mmol/Pearson    Potassium 4.8 3.5 - 5.1 mmol/Pearson  Chloride 95 (Pearson) 101 - 111 mmol/Pearson   CO2 42 (H) 22 - 32 mmol/Pearson   Glucose, Bld 163 (H) 65 - 99 mg/dL   BUN 47 (H) 6 - 20 mg/dL   Creatinine, Ser 1.33 (H) 0.44 - 1.00 mg/dL   Calcium 8.9 8.9 - 10.3 mg/dL   Total Protein 6.3 (Pearson) 6.5 - 8.1 g/dL   Albumin 3.4 (Pearson) 3.5 - 5.0 g/dL   AST 17 15 - 41 U/Pearson   ALT 13 (Pearson) 14 - 54 U/Pearson   Alkaline Phosphatase 73 38 - 126 U/Pearson   Total Bilirubin 0.5 0.3 - 1.2 mg/dL   GFR calc non Af Amer 38 (Pearson) >60 mL/min   GFR calc Af Amer 44 (Pearson) >60 mL/min    Comment: (NOTE) The eGFR has been calculated using the CKD EPI equation. This calculation has not been validated in all clinical situations. eGFR's persistently <60 mL/min signify possible Chronic Kidney Disease.    Anion gap 8 5 - 15  CBC     Status: Abnormal   Collection Time: 11/12/15  4:38 AM  Result Value Ref Range   WBC 40.4 (H) 4.0 - 10.5 K/uL   RBC 3.46 (Pearson) 3.87 - 5.11 MIL/uL   Hemoglobin 10.8 (Pearson) 12.0 - 15.0 g/dL   HCT 36.3 36.0 - 46.0 %   MCV 104.9 (H) 78.0 - 100.0 fL   MCH 31.2 26.0 - 34.0 pg   MCHC 29.8 (Pearson) 30.0 - 36.0 g/dL   RDW 15.8 (H) 11.5 - 15.5 %   Platelets 204 150 - 400 K/uL  Blood gas, arterial     Status: Abnormal   Collection Time: 11/12/15  4:45 AM  Result Value Ref Range   FIO2 60.00    Delivery systems BILEVEL POSITIVE AIRWAY PRESSURE    Inspiratory PAP 14.0    Expiratory PAP 6.0    pH, Arterial 7.333 (Pearson) 7.350 - 7.450   pCO2 arterial 87.7 (HH) 35.0 - 45.0 mmHg    Comment: CRITICAL RESULT CALLED TO, READ BACK BY AND VERIFIED WITH:  DANIELS,J.RN AT 0514 11/12/15 ANDERSON,S RRT    pO2, Arterial 115.0 (H) 80.0 - 100.0 mmHg   Bicarbonate 40.6 (H) 20.0 - 24.0 mEq/Pearson   TCO2 13.5 0 - 100 mmol/Pearson   Acid-Base Excess 18.5 (H) 0.0 - 2.0 mmol/Pearson   O2 Saturation 98.2 %   Collection site RIGHT RADIAL    Drawn by [id number]    Sample type ARTERIAL    Allens test (pass/fail) PASS PASS    ABGS  Recent Labs  11/12/15 0445  PHART 7.333*  PO2ART 115.0*   TCO2 13.5  HCO3 40.6*   CULTURES Recent Results (from the past 240 hour(s))  MRSA PCR Screening     Status: Abnormal   Collection Time: 11/11/15  8:17 PM  Result Value Ref Range Status   MRSA by PCR POSITIVE (A) NEGATIVE Final    Comment:        The GeneXpert MRSA Assay (FDA approved for NASAL specimens only), is one component of a comprehensive MRSA colonization surveillance program. It is not intended to diagnose MRSA infection nor to guide or monitor treatment for MRSA infections. RESULT CALLED TO, READ BACK BY AND VERIFIED WITH: DANIELS,J ON 11/11/15 AT 2205 BY LOY,C    Studies/Results: Dg Chest 1 View  11/10/2015  CLINICAL DATA:  Productive cough. EXAM: CHEST 1 VIEW COMPARISON:  10/10/2015 .  03/14/2015. FINDINGS: Mediastinum and hilar structures are normal. Cardiomegaly with normal pulmonary vascularity. Mild interstitial prominence  noted bilaterally with subtle Kerley B-lines. A very mild component of congestive heart failure cannot be excluded . No overt pulmonary alveolar infiltrate. Small left pleural effusion. No pneumothorax . IMPRESSION: 1. Cardiomegaly with very subtle bilateral interstitial prominence with subtle Kerley B-lines. Very mild changes of congestive heart failure cannot be excluded. Tiny left pleural effusion cannot be excluded . 2. No focal alveolar infiltrate. Electronically Signed   By: Marcello Moores  Register   On: 11/10/2015 14:59   Ct Head Wo Contrast  11/11/2015  CLINICAL DATA:  74 year old female with altered mental status and respiratory failure. EXAM: CT HEAD WITHOUT CONTRAST TECHNIQUE: Contiguous axial images were obtained from the base of the skull through the vertex without intravenous contrast. COMPARISON:  Head CT dated 11/18/2013 FINDINGS: There is slight prominence of the ventricles and sulci compatible with age-related volume loss. Mild periventricular and deep white matter hypodensities represent chronic microvascular ischemic changes. There is no  intracranial hemorrhage. No mass effect or midline shift identified. The visualized paranasal sinuses and mastoid air cells are well aerated. There is hyperostosis frontalis. Stable old right occipital calvarial osseous lesion, likely an exostosis. The calvarium is intact. IMPRESSION: No acute intracranial hemorrhage. Mild age-related atrophy and chronic microvascular ischemic disease. If symptoms persist and there are no contraindications, MRI may provide better evaluation if clinically indicated Electronically Signed   By: Anner Crete M.D.   On: 11/11/2015 18:47   Dg Chest Portable 1 View  11/11/2015  CLINICAL DATA:  Shortness of breath, congestion. EXAM: PORTABLE CHEST 1 VIEW COMPARISON:  11/10/2015 FINDINGS: Heart is borderline in size. Left basilar opacity likely reflects atelectasis. No confluent opacity on the right. No visible effusions or acute bony abnormality. Aortic calcifications in the arch. IMPRESSION: Borderline heart size. Left base opacity likely reflects atelectasis. Electronically Signed   By: Rolm Baptise M.D.   On: 11/11/2015 11:42    Medications:  Prior to Admission:  Prescriptions prior to admission  Medication Sig Dispense Refill Last Dose  . albuterol (PROVENTIL) (2.5 MG/3ML) 0.083% nebulizer solution Take 2.5 mg by nebulization 3 (three) times daily.   11/11/2015 at Unknown time  . alendronate (FOSAMAX) 70 MG tablet Take 70 mg by mouth every Friday. Take with a full glass of water on an empty stomach.   Past Week at Unknown time  . ALPRAZolam (XANAX) 1 MG tablet Take one tablet by mouth at bedtime for anxiety (Patient taking differently: Take 1 mg by mouth 3 (three) times daily as needed for anxiety. Take one tablet by mouth at bedtime for anxiety) 30 tablet 5 11/11/2015 at Unknown time  . diltiazem (TIAZAC) 240 MG 24 hr capsule Take 240 mg by mouth every morning.    11/11/2015 at Unknown time  . ferrous sulfate 325 (65 FE) MG tablet Take 325 mg by mouth daily.    11/11/2015 at Unknown time  . FLUoxetine (PROZAC) 20 MG capsule Take 20 mg by mouth daily.   11/11/2015 at Unknown time  . fluticasone (FLONASE) 50 MCG/ACT nasal spray Place 2 sprays into the nose daily.   11/10/2015 at Unknown time  . furosemide (LASIX) 40 MG tablet Take 1 tablet (40 mg total) by mouth every morning. As needed for edema (Patient taking differently: Take 40 mg by mouth daily. ) 30 tablet 5 11/10/2015 at Unknown time  . guaiFENesin (ROBITUSSIN) 100 MG/5ML liquid Take 300 mg by mouth every 6 (six) hours as needed for cough.    Past Week at Unknown time  . HYDROcodone-acetaminophen (NORCO/VICODIN) 5-325  MG tablet Take one tablet by mouth every 6 hours as needed for pain. Max APAP 3gm/24 hours from all sources 120 tablet 0 Past Week at Unknown time  . magnesium hydroxide (MILK OF MAGNESIA) 400 MG/5ML suspension Take 30 mLs by mouth daily as needed for mild constipation.   Past Week at Unknown time  . Multiple Vitamin (MULTIVITAMIN WITH MINERALS) TABS tablet Take 1 tablet by mouth daily.   11/11/2015 at Unknown time  . pantoprazole (PROTONIX) 40 MG tablet Take 1 tablet (40 mg total) by mouth 2 (two) times daily before a meal. 60 tablet 12 11/11/2015 at Unknown time  . predniSONE (DELTASONE) 10 MG tablet Take 1 tablet (10 mg total) by mouth daily with breakfast. 30 tablet 0 11/11/2015 at Unknown time  . risperiDONE (RISPERDAL) 0.5 MG tablet Take 0.5 mg by mouth at bedtime.   11/10/2015 at Unknown time  . tiotropium (SPIRIVA) 18 MCG inhalation capsule Place 18 mcg into inhaler and inhale every morning.   11/11/2015 at Unknown time   Scheduled: . albuterol  2.5 mg Nebulization TID  . diltiazem  240 mg Oral q morning - 10a  . enoxaparin (LOVENOX) injection  40 mg Subcutaneous Q24H  . ferrous sulfate  325 mg Oral Daily  . FLUoxetine  20 mg Oral Daily  . fluticasone  2 spray Each Nare Daily  . furosemide  40 mg Oral q morning - 10a  . levofloxacin (LEVAQUIN) IV  500 mg Intravenous Q24H  .  methylPREDNISolone (SOLU-MEDROL) injection  80 mg Intravenous Q12H  . pantoprazole  40 mg Oral BID AC  . risperiDONE  0.5 mg Oral QHS  . sodium chloride  3 mL Intravenous Q12H   Continuous: . sodium chloride 100 mL/hr at 11/12/15 0200   [id number], guaiFENesin, ondansetron **OR** ondansetron (ZOFRAN) IV  Assesment: She is admitted with acute hypercapnic respiratory failure. She is on BiPAP but has not had a great response to that. She has severe COPD at baseline. I think she actually has healthcare associated pneumonia with the infiltrate in the left base white blood count of 40,000 and her clinical situation. She has acute renal insufficiency and that's about the same. I think she is dehydrated. She has hypertension at baseline and that's pretty well controlled.  She has severe anxiety and depression/bipolar disease at baseline. Active Problems:   Acute renal insufficiency   Essential hypertension   COPD (chronic obstructive pulmonary disease) (HCC)   Acute respiratory failure with hypercapnia (HCC)   COPD exacerbation ([place])    Plan: She is listed as wanting full measures done but I think in the past I have had her with DO NOT RESUSCITATE status and I think that is appropriate. I will discuss with family. In the meantime she is going to have to remain on the BiPAP continue with IV fluids IV steroids IV antibiotics repeat laboratory work and chest x-ray. She is critically ill and may not survive this episode    LOS: 1 day   April Pearson 11/12/2015, 7:50 AM

## 2015-11-12 NOTE — Progress Notes (Signed)
[place] Progress Note Patient Name: CRISSA LINDT DOB: [birth year] MRN: [id number]   Date of Service  11/12/2015  HPI/Events of Note  Incontinence - concern for skin break down. Request for Foley Catheter.   eICU Interventions  Will order Foley Catheter placed.      Intervention Category Minor Interventions: Routine modifications to care plan (e.g. PRN medications for pain, fever)  Timothy Trudell Eugene 11/12/2015, 8:10 PM

## 2015-11-12 NOTE — Consult Note (Signed)
Consultation Note Date: 11/12/2015   Patient Name: April Pearson  DOB: [birth year]  MRN: [id number]  Age / Sex: 74 y.o., female  PCP: Sinda Du, MD Referring Physician: Sinda Du, MD  Reason for Consultation: Establishing goals of care and Psychosocial/spiritual support  Clinical Assessment/Narrative: April Pearson is resting in bed with her sister, April Pearson at bedside.  She is having improved breathing at this time, with use of [place].  We talk about her chronic health conditions.  She shares that she has been back and forth in the hospital a lot.  April Pearson states she wants to treat the treatable, but no life support.  We talk about her use of BiPAP, and April Pearson tells me that she doesn't want to be put back on BiPAP even if it means she will die.  We talk about what this would mean and how we can control her symptoms.  Her sister, April Pearson leans forward, stating that she wants to make sure she hears, and I ask April Pearson, "you don't want to have the BiPAP again, even if it means you will die?",  states "that is correct".   April Pearson states she can agree, that she has seen how difficult this is for her sister, she knows that she will not be able to overcome this, and she has seen ' her suffering'.   We talk about continuing the current treatment plan with the goal to get her back to [facility] with Hospice care ( no provider chosen).     Contacts/Participants in Discussion:April Pearson makes her own decisions at this time.  Primary Decision Maker: April. Pearson    Relationship to Patient April. Pearson tells me that she wants her sister April Pearson to make choices for her if she is unable. April Pearson states that she knows April Pearson's wishes.  HCPOA: no legal HCPOA.    SUMMARY OF RECOMMENDATIONS  Code Status/Advance Care Planning: DNR  DO NOT USE BiPAP    Code Status Orders        Start     Ordered   11/12/15 0803  Do not attempt  resuscitation (DNR)   Continuous    Question Answer Comment  In the event of cardiac or respiratory ARREST Do not call a "code blue"   In the event of cardiac or respiratory ARREST Do not perform Intubation, CPR, defibrillation or ACLS   In the event of cardiac or respiratory ARREST Use medication by any route, position, wound care, and other measures to relive pain and suffering. May use oxygen, suction and manual treatment of airway obstruction as needed for comfort.      11/12/15 0802    Advance Directive Documentation        Most Recent Value   Type of Advance Directive  Out of facility DNR (pink MOST or yellow form)   Pre-existing out of facility DNR order (yellow form or pink MOST form)     "MOST" Form in Place?        Other Directives:Other  April. O[place]inn states that she DOES NOT WANT TO USE BiPAP EVEN IF THIS MEANS SHE WILL DIE. Sister April Pearson is at bedside and she is in agreement.    Symptom Management:   Xanax 1 mg TID PRN  Zofran 4 mg PO/IV Q 6 hours PRN.   Palliative Prophylaxis:   Oral Care  Additional Recommendations (Limitations, Scope, Preferences): DOES NOT WANT TO USE BiPAP EVEN IF THIS MEANS SHE WILL DIE. Sister April Pearson is at bedside and she is  in agreement.     Psycho-social/Spiritual:  Support System: Strong, lives at [facility] for last 6 months, prior was in ALF for 10 years.  Two sister in town to care for her, April Pearson and April Pearson.  Desire for further Chaplaincy support:Not discussed today.  Additional Recommendations: Caregiving  Support/Resources  Prognosis: Unable to determine, based on outcomes.   Discharge Planning: Based on outcomes.    Chief Complaint/ Primary Diagnoses: Present on Admission:  . COPD (chronic obstructive pulmonary disease) ([place]) . Acute respiratory failure with hypercapnia ([place]) . Essential hypertension . COPD exacerbation ([place])  I have reviewed the medical record, interviewed the patient and family, and examined the patient. The  following aspects are pertinent.  Past Medical History  Diagnosis Date  . Osteoporosis   . Weakness   . Tachycardia   . Arthritis     KNEES AND HANDS  . Coarse tremors     TREMORS BOTH HANDS - PT STATES SIDE EFFECT OF HER MEDICATIONS FOR HER BREATHING  . Dizziness     sometimes  . Shortness of breath     USES OXYGEN 2 L / MIN NASAL CANNUA   24 HRS A DAY; LIVES IN ASSISTED LIVING April Pearson  . Anginal pain ([place])     "with fluttering, seen MD about it"  . History of shingles   . Emphysema   . GERD (gastroesophageal reflux disease)     sometimes  . Laryngitis     10/2013, hx of  . Pneumonia     hx of  . Bipolar 1 disorder ([place])   . Cough 06/29/14    pt states recent cough / cold - feeling better - cough now non-productive.  . Frequent urination   . COPD (chronic obstructive pulmonary disease) ([place])   . Cancer ([place])     bladder  . Anxiety   . Depression   . Edema   . Insomnia    Social History   Social History  . Marital Status: Single    Spouse Name: N/A  . Number of Children: N/A  . Years of Education: N/A   Social History Main Topics  . Smoking status: Former Smoker -- 2.00 packs/day for 40 years    Types: Cigarettes    Quit date: 11/13/2000  . Smokeless tobacco: Never Used     Comment: QUIT SMOKING YRS AGO  . Alcohol Use: No  . Drug Use: No  . Sexual Activity: No   Other Topics Concern  . None   Social History Narrative   ** Merged History Encounter **       Family History  Problem Relation Age of Onset  . Hypertension Mother   . Kidney failure Mother   . Cancer Father   . Asthma Father   . Heart failure Father   . Hypertension Father   . Colon cancer Father     diagnosed in his late 36, deceased from lung issues  . Colon cancer Brother     diagnosed in his late 82s, succumbed to the disease   Scheduled Meds: . albuterol  2.5 mg Nebulization TID  .  Chlorhexidine Gluconate Cloth  6 each Topical Q0600  . diltiazem  240 mg Oral q morning - 10a  . enoxaparin (LOVENOX) injection  40 mg Subcutaneous Q24H  . ferrous sulfate  325 mg Oral Daily  . FLUoxetine  20 mg Oral Daily  . fluticasone  2 spray Each  Nare Daily  . furosemide  40 mg Oral q morning - 10a  . [START ON 11/13/2015] levofloxacin (LEVAQUIN) IV  750 mg Intravenous Q48H  . methylPREDNISolone (SOLU-MEDROL) injection  80 mg Intravenous Q12H  . mupirocin ointment  1 application Nasal BID  . pantoprazole  40 mg Oral BID AC  . risperiDONE  0.5 mg Oral QHS  . sodium chloride  3 mL Intravenous Q12H   Continuous Infusions: . sodium chloride 100 mL/hr at 11/12/15 0200   PRN Meds:.ALPRAZolam, guaiFENesin, ondansetron **OR** ondansetron (ZOFRAN) IV Medications Prior to Admission:  Prior to Admission medications   Medication Sig Start Date End Date Taking? Authorizing Provider  albuterol (PROVENTIL) (2.5 MG/3ML) 0.083% nebulizer solution Take 2.5 mg by nebulization 3 (three) times daily.   Yes Historical Provider, MD  alendronate (FOSAMAX) 70 MG tablet Take 70 mg by mouth every Friday. Take with a full glass of water on an empty stomach.   Yes Historical Provider, MD  ALPRAZolam Duanne Moron) 1 MG tablet Take one tablet by mouth at bedtime for anxiety Patient taking differently: Take 1 mg by mouth 3 (three) times daily as needed for anxiety. Take one tablet by mouth at bedtime for anxiety 03/23/15  Yes Lauree Chandler, NP  diltiazem (TIAZAC) 240 MG 24 hr capsule Take 240 mg by mouth every morning.    Yes Historical Provider, MD  ferrous sulfate 325 (65 FE) MG tablet Take 325 mg by mouth daily.   Yes Historical Provider, MD  FLUoxetine (PROZAC) 20 MG capsule Take 20 mg by mouth daily.   Yes Historical Provider, MD  fluticasone (FLONASE) 50 MCG/ACT nasal spray Place 2 sprays into the nose daily.   Yes Historical Provider, MD  furosemide (LASIX) 40 MG tablet Take 1 tablet (40 mg total) by mouth  every morning. As needed for edema Patient taking differently: Take 40 mg by mouth daily.  11/12/14  Yes Sinda Du, MD  guaiFENesin (ROBITUSSIN) 100 MG/5ML liquid Take 300 mg by mouth every 6 (six) hours as needed for cough.    Yes Historical Provider, MD  HYDROcodone-acetaminophen (NORCO/VICODIN) 5-325 MG tablet Take one tablet by mouth every 6 hours as needed for pain. Max APAP 3gm/24 hours from all sources 08/18/15  Yes Estill Dooms, MD  magnesium hydroxide (MILK OF MAGNESIA) 400 MG/5ML suspension Take 30 mLs by mouth daily as needed for mild constipation.   Yes Historical Provider, MD  Multiple Vitamin (MULTIVITAMIN WITH MINERALS) TABS tablet Take 1 tablet by mouth daily.   Yes Historical Provider, MD  pantoprazole (PROTONIX) 40 MG tablet Take 1 tablet (40 mg total) by mouth 2 (two) times daily before a meal. 03/04/15  Yes Sinda Du, MD  predniSONE (DELTASONE) 10 MG tablet Take 1 tablet (10 mg total) by mouth daily with breakfast. 03/21/15  Yes Sinda Du, MD  risperiDONE (RISPERDAL) 0.5 MG tablet Take 0.5 mg by mouth at bedtime.   Yes Historical Provider, MD  tiotropium (SPIRIVA) 18 MCG inhalation capsule Place 18 mcg into inhaler and inhale every morning.   Yes Historical Provider, MD   No Known Allergies  Review of Systems  Unable to perform ROS: Unstable vital signs    Physical Exam  Constitutional: She is oriented to person, place, and time. She appears well-developed.  HENT:  Head: Normocephalic and atraumatic.  Cardiovascular: Normal rate and regular rhythm.   Respiratory: She is in respiratory distress. She has rales.  Productive cough  GI: Soft. Bowel sounds are normal. She exhibits no distension. There  is no tenderness. There is no guarding.  Neurological: She is alert and oriented to person, place, and time.    Vital Signs: BP 135/64 mmHg  Pulse 93  Temp(Src) 98 F (36.7 C) (Oral)  Resp 21  Ht 5\' 7"  (1.702 m)  Wt 72 kg (158 lb 11.7 oz)  BMI 24.85 kg/m2   SpO2 95%  LMP  (LMP Unknown)  SpO2: SpO2: 95 % O2 Device:SpO2: 95 % O2 Flow Rate: .O2 Flow Rate (L/min): 40 L/min  IO: Intake/output summary:  Intake/Output Summary (Last 24 hours) at 11/12/15 1311 Last data filed at 11/12/15 1141  Gross per 24 hour  Intake    825 ml  Output   1000 ml  Net   -175 ml    LBM:   Baseline Weight: Weight: 70.308 kg (155 lb) Most recent weight: Weight: 72 kg (158 lb 11.7 oz)      Palliative Assessment/Data:  Flowsheet Rows        Most Recent Value   Intake Tab    Referral Department  Hospitalist   Unit at Time of Referral  ICU   Palliative Care Primary Diagnosis  Pulmonary   Date Notified  11/12/15   Reason for referral  Clarify Goals of Care, End of [place], Advance Care Planning   Date of Admission  11/11/15   Date first seen by Palliative Care  11/12/15   # of days Palliative referral response time  0 Day(s)   # of days IP prior to Palliative referral  1   Clinical Assessment    Palliative Performance Scale Score  40%   Pain Max last 24 hours  8   Pain Min Last 24 hours  5   Dyspnea Max Last 24 Hours  9   Dyspnea Min Last 24 hours  6   Psychosocial & Spiritual Assessment    Social Work Plan of Care  Staff support   Palliative Care Outcomes    Patient/Family meeting held?  Yes   Who was at the meeting?  sister [facility]    Palliative Care Outcomes  Clarified goals of care, Provided end of life care assistance, ACP counseling assistance, Provided advance care planning   Patient/Family wishes: Interventions discontinued/not started   Mechanical Ventilation, BiPAP   Palliative Care follow-up planned  Yes, Facility      Additional Data Reviewed:  CBC:    Component Value Date/Time   WBC 40.4* 11/12/2015 0438   HGB 10.8* 11/12/2015 0438   HCT 36.3 11/12/2015 0438   PLT 204 11/12/2015 0438   MCV 104.9* 11/12/2015 0438   NEUTROABS 20.5* 11/11/2015 1247   LYMPHSABS 0.7 11/11/2015 1247   MONOABS 0.4 11/11/2015 1247   EOSABS  0.0 11/11/2015 1247   BASOSABS 0.0 11/11/2015 1247   Comprehensive Metabolic Panel:    Component Value Date/Time   NA 145 11/12/2015 0438   K 4.8 11/12/2015 0438   CL 95* 11/12/2015 0438   CO2 42* 11/12/2015 0438   BUN 47* 11/12/2015 0438   CREATININE 1.33* 11/12/2015 0438   GLUCOSE 163* 11/12/2015 0438   CALCIUM 8.9 11/12/2015 0438   AST 17 11/12/2015 0438   ALT 13* 11/12/2015 0438   ALKPHOS 73 11/12/2015 0438   BILITOT 0.5 11/12/2015 0438   PROT 6.3* 11/12/2015 0438   ALBUMIN 3.4* 11/12/2015 0438     Time In: 1100  Time Out: 1220 Time Total: 80 minutes Greater than 50%  of this time was spent counseling and coordinating care related to  the above assessment and plan.  Signed by: Drue Novel, NP  Drue Novel, NP  11/12/2015, 1:11 PM  Please contact Palliative Medicine Team phone at [phone] for questions and concerns.

## 2015-11-12 NOTE — Progress Notes (Signed)
Went into room to assess patient. Patient requesting Bipap removal. Removed mask, performed oral care, and gave patient water per request. Patient asked "Why are you doing this to me?", motioning toward the Bipap. I explained to patient that the Bipap was helping her to breathe and keeping her alive. Patient asked "How long will it take me to die?". I explained to patient that no one knows. Assisted patient with comfort measures in the bed at this time. Will relay my finding to Aniceto Boss, palliative nurse practitioner.

## 2015-11-12 NOTE — Progress Notes (Signed)
At 950 patient stated she wanted to put BiPAP back on(even though her SpO2 was WNL, she felt she needed it .) She then had a coughing spell and wanted it off to expectorate. She was then placed on 40% VM and is eating breakfast. 1045 she said mask was hot and placed on 4LNC. SpO2 now 93%

## 2015-11-12 NOTE — Care Management Note (Signed)
Case Management Note  Patient Details  Name: NATANYA BICKNELL MRN: [id number] Date of Birth: 23-Jul-1941  Subjective/Objective:                  Pt is from [facility], admitted with COPD exacerbation. CSW is aware of admission, palliative has been consulted. DC disposition pending.   Action/Plan: No CM needs anticipated, will cont to follow.   Expected Discharge Date:       11/15/2015           Expected Discharge Plan:     In-House Referral:  Clinical Social Work  Discharge planning Services  CM Consult  Post Acute Care Choice:  NA Choice offered to:  NA  DME Arranged:    DME Agency:     HH Arranged:    HH Agency:     Status of Service:  In process, will continue to follow  Medicare Important Message Given:  Yes Date Medicare IM Given:    Medicare IM give by:    Date Additional Medicare IM Given:    Additional Medicare Important Message give by:     If discussed at [place] of Stay Meetings, dates discussed:    Additional Comments:  Sherald Barge, RN 11/12/2015, 2:25 PM

## 2015-11-12 NOTE — Clinical Social Work Note (Signed)
Clinical Social Work Assessment  Patient Details  Name: April Pearson MRN: [id number] Date of Birth: Jun 16, 1941  Date of referral:  11/12/15               Reason for consult:  Facility Placement                Permission sought to share information with:    Permission granted to share information::     Name::        Agency::     Relationship::     Contact Information:     Housing/Transportation Living arrangements for the past 2 months:  [place] of Information:  Facility, Other (Comment Required) Daine Floras, sister) Patient Interpreter Needed:  None Criminal Activity/Legal Involvement Pertinent to Current Situation/Hospitalization:  No - Comment as needed Significant Relationships:  Siblings Lives with:  Facility Resident Do you feel safe going back to the place where you live?  Yes Need for family participation in patient care:  Yes (Comment)  Care giving concerns:  None identified.    Social Worker assessment / plan:  CSW spoke with Keri at [facility].  Kristin Bruins stated that that patient had been at the facility for several months. She stated that patient is in a wheelchair but the facility get her up daily.  Keri reported that patient is on continuous oxygen. She advised that patient has very good family support. Kristin Bruins stated that patient could return to the facility at discharge. CSW spoke with patient's sister, Daine Floras, who was at bedside. (Patient was using the bathroom). She confirmed patient's statements. Ms. Madalyn Rob stated that it was the family's goal for patient to return to [facility] if she was able to. She indicated that she was aware that patient was not doing "good." Palliative care then began to speak with patient's sister.    Employment status:  Retired Nurse, adult PT Recommendations:  Not assessed at this time Information / Referral to community resources:     Patient/Family's Response to care:  Patient's family is  agreeable for patient to return to [facility] at discharge.  Patient/Family's Understanding of and Emotional Response to Diagnosis, Current Treatment, and Prognosis:  Patient's family understands patient's diagnosis, treatment and prognosis is poor at this time.   Emotional Assessment Appearance:  Appears stated age Attitude/Demeanor/Rapport:  Unable to Assess Affect (typically observed):  Unable to Assess Orientation:  Oriented to Self, Oriented to Place, Oriented to  Time, Oriented to Situation Alcohol / Substance use:  Not Applicable Psych involvement (Current and /or in the community):  No (Comment)  Discharge Needs  Concerns to be addressed:  Discharge Planning Concerns Readmission within the last 30 days:  No Current discharge risk:  None Barriers to Discharge:  No Barriers Identified   Ihor Gully, LCSW 11/12/2015, 11:58 AM [phone]

## 2015-11-12 NOTE — Care Management Important Message (Signed)
Important Message  Patient Details  Name: April Pearson MRN: [id number] Date of Birth: 04/20/1941   Medicare Important Message Given:  Yes    Sherald Barge, RN 11/12/2015, 2:24 PM

## 2015-11-12 NOTE — NC FL2 (Signed)
[place] LEVEL OF CARE SCREENING TOOL     IDENTIFICATION  Patient Name: April Pearson Birthdate: 1941-10-12 Sex: female Admission Date (Current Location): 11/11/2015  [facility] and [place] Number:  [employer] and Address:  [place] [address] [place]      Provider Number: [id number]  Attending Physician Name and Address:  Sinda Du, MD  Relative Name and Phone Number:       Current Level of Care: Hospital Recommended Level of Care: [facility] Prior Approval Number:    Date Approved/Denied:   PASRR Number: [id number]  Discharge Plan: SNF    Current Diagnoses: Patient Active Problem List   Diagnosis Date Noted  . COPD exacerbation ([place]) 11/11/2015  . Iron deficiency anemia due to chronic blood loss 11/06/2015  . Anemia due to chronic kidney disease 11/06/2015  . Anemia in chronic kidney disease 09/23/2015  . Heme + stool 06/07/2015  . Respiratory failure with hypercapnia ([place]) 03/14/2015  . Acute respiratory failure with hypercapnia ([place]) 03/14/2015  . Acute encephalopathy 03/14/2015  . Acute blood loss anemia 03/04/2015  . History of colonic polyps 03/04/2015  . Personal history of bladder cancer 03/04/2015  . Dysphagia, pharyngoesophageal phase   . Anemia 03/01/2015  . GI bleed 03/01/2015  . Chest pain 03/01/2015  . AKI (acute kidney injury) ([place]) 03/01/2015  . Essential hypertension 03/01/2015  . COPD (chronic obstructive pulmonary disease) ([place]) 03/01/2015  . Depression with anxiety 03/01/2015  . Insomnia 03/01/2015  . Anemia 11/09/2014  . HCAP (healthcare-associated pneumonia) 11/09/2014  . Sepsis ([place]) 11/06/2014  . Acute renal insufficiency 11/06/2014  . Leukocytosis 11/06/2014  . Fever 11/06/2014  . Bladder cancer ([place]) 07/02/2014  . COPD with acute exacerbation ([place]) 01/26/2012  . Hypertension 01/26/2012  . Bipolar disease, chronic ([place]) 01/26/2012  . Tachycardia  01/26/2012    Orientation RESPIRATION BLADDER Height & Weight    Self, Time, Situation, Place  O2 Continent 5\' 7"  (170.2 cm) 158 lbs.  BEHAVIORAL SYMPTOMS/MOOD NEUROLOGICAL BOWEL NUTRITION STATUS      Continent Diet (Diet Heart Healthy)  AMBULATORY STATUS COMMUNICATION OF NEEDS Skin   Extensive Assist Verbally Normal                       Personal Care Assistance Level of Assistance  Bathing, Dressing Bathing Assistance: Limited assistance   Dressing Assistance: Limited assistance     Functional Limitations Info             SPECIAL CARE FACTORS FREQUENCY                       Contractures      Additional Factors Info  Psychotropic Code Status Info:  (DNR)   Psychotropic Info: Risperdal, Xanax   Isolation Precautions Info:  (11/11/15 mrsa by pcr)     Current Medications (11/12/2015):  This is the current hospital active medication list Current Facility-Administered Medications  Medication Dose Route Frequency Provider Last Rate Last Dose  . 0.9 %  sodium chloride infusion   Intravenous Continuous Nimish C Gosrani, MD 100 mL/hr at 11/12/15 0200    . albuterol (PROVENTIL) (2.5 MG/3ML) 0.083% nebulizer solution 2.5 mg  2.5 mg Nebulization TID Doree Albee, MD   2.5 mg at 11/12/15 0858  . ALPRAZolam (XANAX) tablet 1 mg  1 mg Oral TID PRN Doree Albee, MD      . Chlorhexidine Gluconate Cloth 2 %  PADS 6 each  6 each Topical Q0600 Sinda Du, MD      . diltiazem (CARDIZEM CD) 24 hr capsule 240 mg  240 mg Oral q morning - 10a Nimish C Gosrani, MD      . enoxaparin (LOVENOX) injection 40 mg  40 mg Subcutaneous Q24H Nimish C Anastasio Champion, MD   40 mg at 11/11/15 2104  . ferrous sulfate tablet 325 mg  325 mg Oral Daily Nimish C Gosrani, MD      . FLUoxetine (PROZAC) capsule 20 mg  20 mg Oral Daily Nimish C Gosrani, MD      . fluticasone (FLONASE) 50 MCG/ACT nasal spray 2 spray  2 spray Each Nare Daily Nimish C Gosrani, MD      . furosemide (LASIX) tablet 40  mg  40 mg Oral q morning - 10a Nimish C Gosrani, MD      . guaiFENesin (ROBITUSSIN) 100 MG/5ML solution 300 mg  300 mg Oral Q6H PRN Doree Albee, MD      . Derrill Memo ON 11/13/2015] levofloxacin (LEVAQUIN) IVPB 750 mg  750 mg Intravenous Q48H Sinda Du, MD      . methylPREDNISolone sodium succinate (SOLU-MEDROL) 125 mg/2 mL injection 80 mg  80 mg Intravenous Q12H Nimish C Anastasio Champion, MD   80 mg at 11/12/15 0824  . mupirocin ointment (BACTROBAN) 2 % 1 application  1 application Nasal BID Sinda Du, MD      . ondansetron [facility]) tablet 4 mg  4 mg Oral Q6H PRN Nimish Luther Parody, MD       Or  . ondansetron (ZOFRAN) injection 4 mg  4 mg Intravenous Q6H PRN Nimish C Gosrani, MD      . pantoprazole (PROTONIX) EC tablet 40 mg  40 mg Oral BID AC Nimish C Anastasio Champion, MD   40 mg at 11/11/15 2128  . risperiDONE (RISPERDAL) tablet 0.5 mg  0.5 mg Oral QHS Nimish C Gosrani, MD   0.5 mg at 11/11/15 2103  . sodium chloride 0.9 % injection 3 mL  3 mL Intravenous Q12H Nimish C Gosrani, MD   3 mL at 11/11/15 2130   Facility-Administered Medications Ordered in Other Encounters  Medication Dose Route Frequency Provider Last Rate Last Dose  . sodium chloride 0.9 % injection 10 mL  10 mL Intravenous PRN Patrici Ranks, MD   10 mL at 09/23/15 1059     Discharge Medications: Please see discharge summary for a list of discharge medications.  Relevant Imaging Results:  Relevant Lab Results:   Additional Information    Mileah Hemmer, Clydene Pugh, LCSW

## 2015-11-13 ENCOUNTER — Inpatient Hospital Stay (HOSPITAL_COMMUNITY): Payer: Medicare Other

## 2015-11-13 LAB — CBC WITH DIFFERENTIAL/PLATELET
BASOS ABS: 0 10*3/uL (ref 0.0–0.1)
BASOS PCT: 0 %
EOS ABS: 0 10*3/uL (ref 0.0–0.7)
EOS PCT: 0 %
HCT: 29.1 % — ABNORMAL LOW (ref 36.0–46.0)
Hemoglobin: 8.9 g/dL — ABNORMAL LOW (ref 12.0–15.0)
Lymphocytes Relative: 1 %
Lymphs Abs: 0.3 10*3/uL — ABNORMAL LOW (ref 0.7–4.0)
MCH: 31.3 pg (ref 26.0–34.0)
MCHC: 30.6 g/dL (ref 30.0–36.0)
MCV: 102.5 fL — ABNORMAL HIGH (ref 78.0–100.0)
MONO ABS: 0.4 10*3/uL (ref 0.1–1.0)
Monocytes Relative: 2 %
Neutro Abs: 23.4 10*3/uL — ABNORMAL HIGH (ref 1.7–7.7)
Neutrophils Relative %: 97 %
PLATELETS: 233 10*3/uL (ref 150–400)
RBC: 2.84 MIL/uL — AB (ref 3.87–5.11)
RDW: 15.8 % — AB (ref 11.5–15.5)
WBC: 24 10*3/uL — AB (ref 4.0–10.5)

## 2015-11-13 LAB — BASIC METABOLIC PANEL
ANION GAP: 6 (ref 5–15)
BUN: 40 mg/dL — ABNORMAL HIGH (ref 6–20)
CALCIUM: 8.2 mg/dL — AB (ref 8.9–10.3)
CO2: 39 mmol/L — ABNORMAL HIGH (ref 22–32)
Chloride: 96 mmol/L — ABNORMAL LOW (ref 101–111)
Creatinine, Ser: 1.06 mg/dL — ABNORMAL HIGH (ref 0.44–1.00)
GFR, EST AFRICAN AMERICAN: 58 mL/min — AB (ref >60)
GFR, EST NON AFRICAN AMERICAN: 50 mL/min — AB (ref >60)
GLUCOSE: 153 mg/dL — AB (ref 65–99)
POTASSIUM: 3.7 mmol/L (ref 3.5–5.1)
SODIUM: 141 mmol/L (ref 135–145)

## 2015-11-13 LAB — BLOOD GAS, ARTERIAL
ACID-BASE EXCESS: 13.6 mmol/L — AB (ref 0.0–2.0)
BICARBONATE: 36 meq/L — AB (ref 20.0–24.0)
DRAWN BY: 21310
O2 CONTENT: 4 L/min
O2 Saturation: 98 %
PH ART: 7.298 — AB (ref 7.350–7.450)
TCO2: 12.2 mmol/L (ref 0–100)
pCO2 arterial: 85.1 mmHg (ref 35.0–45.0)
pO2, Arterial: 118 mmHg — ABNORMAL HIGH (ref 80.0–100.0)

## 2015-11-13 MED ORDER — ALBUTEROL SULFATE (2.5 MG/3ML) 0.083% IN NEBU
2.5000 mg | INHALATION_SOLUTION | Freq: Three times a day (TID) | RESPIRATORY_TRACT | Status: DC
  Administered 2015-11-13 – 2015-11-16 (×8): 2.5 mg via RESPIRATORY_TRACT
  Filled 2015-11-13 (×7): qty 3

## 2015-11-13 MED ORDER — ALPRAZOLAM 0.5 MG PO TABS
1.0000 mg | ORAL_TABLET | Freq: Three times a day (TID) | ORAL | Status: DC
  Administered 2015-11-13: 1 mg via ORAL
  Filled 2015-11-13: qty 2

## 2015-11-13 NOTE — Progress Notes (Signed)
PCO2 is 85.1 Patient refuses to wear BiPAP. MD notified.

## 2015-11-13 NOTE — Progress Notes (Signed)
Subjective: She is much more alert today than when I saw her yesterday she became much more alert yesterday around 10 AM. She doesn't remember the events that brought her to the hospital. We discussed her wishes for the future and she does not want BiPAP again. She understands that she may die without that. She says she is having some trouble with anxiety.  Objective: Vital signs in last 24 hours: Temp:  [96.9 F (36.1 C)-98.7 F (37.1 C)] 96.9 F (36.1 C) (12/31 0753) Pulse Rate:  [78-93] 79 (12/31 0800) Resp:  [14-32] 24 (12/31 0800) BP: (70-148)/(32-82) 134/60 mmHg (12/31 0800) SpO2:  [89 %-100 %] 98 % (12/31 0800) FiO2 (%):  [36 %-40 %] 36 % (12/31 0751) Weight:  [72.8 kg (160 lb 7.9 oz)] 72.8 kg (160 lb 7.9 oz) (12/31 0400) Weight change: 2.493 kg (5 lb 7.9 oz)    Intake/Output from previous day: 12/30 0701 - 12/31 0700 In: 2600 [I.V.:2600] Out: 850 [Urine:850]  PHYSICAL EXAM General appearance: alert and cooperative Resp: rhonchi bilaterally and wheezes bilaterally Cardio: regular rate and rhythm, S1, S2 normal, no murmur, click, rub or gallop GI: soft, non-tender; bowel sounds normal; no masses,  no organomegaly Extremities: extremities normal, atraumatic, no cyanosis or edema  Lab Results:  Results for orders placed or performed during the hospital encounter of 11/11/15 (from the past 48 hour(s))  CBC with Differential     Status: Abnormal   Collection Time: 11/11/15 12:47 PM  Result Value Ref Range   WBC 21.7 (H) 4.0 - 10.5 K/uL   RBC 3.38 (L) 3.87 - 5.11 MIL/uL   Hemoglobin 11.0 (L) 12.0 - 15.0 g/dL   HCT 35.5 (L) 36.0 - 46.0 %   MCV 105.0 (H) 78.0 - 100.0 fL   MCH 32.5 26.0 - 34.0 pg   MCHC 31.0 30.0 - 36.0 g/dL   RDW 15.9 (H) 11.5 - 15.5 %   Platelets 298 150 - 400 K/uL   Neutrophils Relative % 95 %   Neutro Abs 20.5 (H) 1.7 - 7.7 K/uL   Lymphocytes Relative 3 %   Lymphs Abs 0.7 0.7 - 4.0 K/uL   Monocytes Relative 2 %   Monocytes Absolute 0.4 0.1 - 1.0  K/uL   Eosinophils Relative 0 %   Eosinophils Absolute 0.0 0.0 - 0.7 K/uL   Basophils Relative 0 %   Basophils Absolute 0.0 0.0 - 0.1 K/uL  Comprehensive metabolic panel     Status: Abnormal   Collection Time: 11/11/15 12:47 PM  Result Value Ref Range   Sodium 141 135 - 145 mmol/L   Potassium 4.3 3.5 - 5.1 mmol/L   Chloride 92 (L) 101 - 111 mmol/L   CO2 46 (H) 22 - 32 mmol/L   Glucose, Bld 182 (H) 65 - 99 mg/dL   BUN 46 (H) 6 - 20 mg/dL   Creatinine, Ser 1.35 (H) 0.44 - 1.00 mg/dL   Calcium 8.9 8.9 - 10.3 mg/dL   Total Protein 6.8 6.5 - 8.1 g/dL   Albumin 4.0 3.5 - 5.0 g/dL   AST 15 15 - 41 U/L   ALT 15 14 - 54 U/L   Alkaline Phosphatase 72 38 - 126 U/L   Total Bilirubin 0.3 0.3 - 1.2 mg/dL   GFR calc non Af Amer 38 (L) >60 mL/min   GFR calc Af Amer 44 (L) >60 mL/min    Comment: (NOTE) The eGFR has been calculated using the CKD EPI equation. This calculation has not been validated in  all clinical situations. eGFR's persistently <60 mL/min signify possible Chronic Kidney Disease.    Anion gap 3 (L) 5 - 15  Brain natriuretic peptide     Status: None   Collection Time: 11/11/15 12:47 PM  Result Value Ref Range   B Natriuretic Peptide 48.0 0.0 - 100.0 pg/mL  Lactic acid, plasma     Status: None   Collection Time: 11/11/15  1:03 PM  Result Value Ref Range   Lactic Acid, Venous 0.9 0.5 - 2.0 mmol/L  I-stat troponin, ED     Status: None   Collection Time: 11/11/15  1:12 PM  Result Value Ref Range   Troponin i, poc 0.00 0.00 - 0.08 ng/mL   Comment 3            Comment: Due to the release kinetics of cTnI, a negative result within the first hours of the onset of symptoms does not rule out myocardial infarction with certainty. If myocardial infarction is still suspected, repeat the test at appropriate intervals.   Lactic acid, plasma     Status: None   Collection Time: 11/11/15  3:27 PM  Result Value Ref Range   Lactic Acid, Venous 0.9 0.5 - 2.0 mmol/L  Blood gas,  arterial     Status: Abnormal   Collection Time: 11/11/15  3:30 PM  Result Value Ref Range   O2 Content 4.0 L/min   Delivery systems NASAL CANNULA    pH, Arterial 7.331 (L) 7.350 - 7.450   pCO2 arterial 88.2 (HH) 35.0 - 45.0 mmHg    Comment: CRITICAL RESULT CALLED TO, READ BACK BY AND VERIFIED WITH: EMMA JENNINGS,RN AT 1600 BY WENDY VIA,RRT,RCP ON 11/11/2015    pO2, Arterial 80.7 80.0 - 100.0 mmHg   Bicarbonate 40.1 (H) 20.0 - 24.0 mEq/L   Acid-Base Excess 18.6 (H) 0.0 - 2.0 mmol/L   O2 Saturation 95.4 %   Patient temperature 37.0    Collection site RIGHT RADIAL    Drawn by [id number]    Sample type ARTERIAL DRAW    Allens test (pass/fail) PASS PASS  Urinalysis, Routine w reflex microscopic (not at [facility])     Status: None   Collection Time: 11/11/15  8:15 PM  Result Value Ref Range   Color, Urine YELLOW YELLOW   APPearance CLEAR CLEAR   Specific Gravity, Urine 1.020 1.005 - 1.030   pH 5.0 5.0 - 8.0   Glucose, UA NEGATIVE NEGATIVE mg/dL   Hgb urine dipstick NEGATIVE NEGATIVE   Bilirubin Urine NEGATIVE NEGATIVE   Ketones, ur NEGATIVE NEGATIVE mg/dL   Protein, ur NEGATIVE NEGATIVE mg/dL   Nitrite NEGATIVE NEGATIVE   Leukocytes, UA NEGATIVE NEGATIVE    Comment: MICROSCOPIC NOT DONE ON URINES WITH NEGATIVE PROTEIN, BLOOD, LEUKOCYTES, NITRITE, OR GLUCOSE <1000 mg/dL.  MRSA PCR Screening     Status: Abnormal   Collection Time: 11/11/15  8:17 PM  Result Value Ref Range   MRSA by PCR POSITIVE (A) NEGATIVE    Comment:        The GeneXpert MRSA Assay (FDA approved for NASAL specimens only), is one component of a comprehensive MRSA colonization surveillance program. It is not intended to diagnose MRSA infection nor to guide or monitor treatment for MRSA infections. RESULT CALLED TO, READ BACK BY AND VERIFIED WITH: DANIELS,J ON 11/11/15 AT 2205 BY LOY,C   Comprehensive metabolic panel     Status: Abnormal   Collection Time: 11/12/15  4:38 AM  Result Value Ref Range   Sodium 145  135 -  145 mmol/L   Potassium 4.8 3.5 - 5.1 mmol/L   Chloride 95 (L) 101 - 111 mmol/L   CO2 42 (H) 22 - 32 mmol/L   Glucose, Bld 163 (H) 65 - 99 mg/dL   BUN 47 (H) 6 - 20 mg/dL   Creatinine, Ser 1.33 (H) 0.44 - 1.00 mg/dL   Calcium 8.9 8.9 - 10.3 mg/dL   Total Protein 6.3 (L) 6.5 - 8.1 g/dL   Albumin 3.4 (L) 3.5 - 5.0 g/dL   AST 17 15 - 41 U/L   ALT 13 (L) 14 - 54 U/L   Alkaline Phosphatase 73 38 - 126 U/L   Total Bilirubin 0.5 0.3 - 1.2 mg/dL   GFR calc non Af Amer 38 (L) >60 mL/min   GFR calc Af Amer 44 (L) >60 mL/min    Comment: (NOTE) The eGFR has been calculated using the CKD EPI equation. This calculation has not been validated in all clinical situations. eGFR's persistently <60 mL/min signify possible Chronic Kidney Disease.    Anion gap 8 5 - 15  CBC     Status: Abnormal   Collection Time: 11/12/15  4:38 AM  Result Value Ref Range   WBC 40.4 (H) 4.0 - 10.5 K/uL   RBC 3.46 (L) 3.87 - 5.11 MIL/uL   Hemoglobin 10.8 (L) 12.0 - 15.0 g/dL   HCT 36.3 36.0 - 46.0 %   MCV 104.9 (H) 78.0 - 100.0 fL   MCH 31.2 26.0 - 34.0 pg   MCHC 29.8 (L) 30.0 - 36.0 g/dL   RDW 15.8 (H) 11.5 - 15.5 %   Platelets 204 150 - 400 K/uL  Blood gas, arterial     Status: Abnormal   Collection Time: 11/12/15  4:45 AM  Result Value Ref Range   FIO2 60.00    Delivery systems BILEVEL POSITIVE AIRWAY PRESSURE    Inspiratory PAP 14.0    Expiratory PAP 6.0    pH, Arterial 7.333 (L) 7.350 - 7.450   pCO2 arterial 87.7 (HH) 35.0 - 45.0 mmHg    Comment: CRITICAL RESULT CALLED TO, READ BACK BY AND VERIFIED WITH:  DANIELS,J.RN AT 7078 11/12/15 ANDERSON,S RRT    pO2, Arterial 115.0 (H) 80.0 - 100.0 mmHg   Bicarbonate 40.6 (H) 20.0 - 24.0 mEq/L   TCO2 13.5 0 - 100 mmol/L   Acid-Base Excess 18.5 (H) 0.0 - 2.0 mmol/L   O2 Saturation 98.2 %   Collection site RIGHT RADIAL    Drawn by [id number]    Sample type ARTERIAL    Allens test (pass/fail) PASS PASS  Lactic acid, plasma     Status: None   Collection Time:  11/12/15  8:15 AM  Result Value Ref Range   Lactic Acid, Venous 1.2 0.5 - 2.0 mmol/L  CBC with Differential/Platelet     Status: Abnormal   Collection Time: 11/13/15  4:30 AM  Result Value Ref Range   WBC 24.0 (H) 4.0 - 10.5 K/uL   RBC 2.84 (L) 3.87 - 5.11 MIL/uL   Hemoglobin 8.9 (L) 12.0 - 15.0 g/dL   HCT 29.1 (L) 36.0 - 46.0 %   MCV 102.5 (H) 78.0 - 100.0 fL   MCH 31.3 26.0 - 34.0 pg   MCHC 30.6 30.0 - 36.0 g/dL   RDW 15.8 (H) 11.5 - 15.5 %   Platelets 233 150 - 400 K/uL   Neutrophils Relative % 97 %   Neutro Abs 23.4 (H) 1.7 - 7.7 K/uL   Lymphocytes Relative 1 %  Lymphs Abs 0.3 (L) 0.7 - 4.0 K/uL   Monocytes Relative 2 %   Monocytes Absolute 0.4 0.1 - 1.0 K/uL   Eosinophils Relative 0 %   Eosinophils Absolute 0.0 0.0 - 0.7 K/uL   Basophils Relative 0 %   Basophils Absolute 0.0 0.0 - 0.1 K/uL  Basic metabolic panel     Status: Abnormal   Collection Time: 11/13/15  4:30 AM  Result Value Ref Range   Sodium 141 135 - 145 mmol/L   Potassium 3.7 3.5 - 5.1 mmol/L    Comment: DELTA CHECK NOTED   Chloride 96 (L) 101 - 111 mmol/L   CO2 39 (H) 22 - 32 mmol/L   Glucose, Bld 153 (H) 65 - 99 mg/dL   BUN 40 (H) 6 - 20 mg/dL   Creatinine, Ser 1.06 (H) 0.44 - 1.00 mg/dL   Calcium 8.2 (L) 8.9 - 10.3 mg/dL   GFR calc non Af Amer 50 (L) >60 mL/min   GFR calc Af Amer 58 (L) >60 mL/min    Comment: (NOTE) The eGFR has been calculated using the CKD EPI equation. This calculation has not been validated in all clinical situations. eGFR's persistently <60 mL/min signify possible Chronic Kidney Disease.    Anion gap 6 5 - 15  Blood gas, arterial     Status: Abnormal   Collection Time: 11/13/15  5:15 AM  Result Value Ref Range   O2 Content 4.0 L/min   Delivery systems NASAL CANNULA    pH, Arterial 7.298 (L) 7.350 - 7.450   pCO2 arterial 85.1 (HH) 35.0 - 45.0 mmHg    Comment: CRITICAL RESULT CALLED TO, READ BACK BY AND VERIFIED WITH: Gershon Cull RN AT 3016 11/13/15 ANDERSON,S RRT     pO2, Arterial 118 (H) 80.0 - 100.0 mmHg   Bicarbonate 36.0 (H) 20.0 - 24.0 mEq/L   TCO2 12.2 0 - 100 mmol/L   Acid-Base Excess 13.6 (H) 0.0 - 2.0 mmol/L   O2 Saturation 98.0 %   Collection site RIGHT RADIAL    Drawn by [id number]    Sample type ARTERIAL    Allens test (pass/fail) PASS PASS    ABGS  Recent Labs  11/13/15 0515  PHART 7.298*  PO2ART 118*  TCO2 12.2  HCO3 36.0*   CULTURES Recent Results (from the past 240 hour(s))  MRSA PCR Screening     Status: Abnormal   Collection Time: 11/11/15  8:17 PM  Result Value Ref Range Status   MRSA by PCR POSITIVE (A) NEGATIVE Final    Comment:        The GeneXpert MRSA Assay (FDA approved for NASAL specimens only), is one component of a comprehensive MRSA colonization surveillance program. It is not intended to diagnose MRSA infection nor to guide or monitor treatment for MRSA infections. RESULT CALLED TO, READ BACK BY AND VERIFIED WITH: DANIELS,J ON 11/11/15 AT 2205 BY LOY,C    Studies/Results: Ct Head Wo Contrast  11/11/2015  CLINICAL DATA:  74 year old female with altered mental status and respiratory failure. EXAM: CT HEAD WITHOUT CONTRAST TECHNIQUE: Contiguous axial images were obtained from the base of the skull through the vertex without intravenous contrast. COMPARISON:  Head CT dated 11/18/2013 FINDINGS: There is slight prominence of the ventricles and sulci compatible with age-related volume loss. Mild periventricular and deep white matter hypodensities represent chronic microvascular ischemic changes. There is no intracranial hemorrhage. No mass effect or midline shift identified. The visualized paranasal sinuses and mastoid air cells are well aerated. There is  hyperostosis frontalis. Stable old right occipital calvarial osseous lesion, likely an exostosis. The calvarium is intact. IMPRESSION: No acute intracranial hemorrhage. Mild age-related atrophy and chronic microvascular ischemic disease. If symptoms persist and there  are no contraindications, MRI may provide better evaluation if clinically indicated Electronically Signed   By: Anner Crete M.D.   On: 11/11/2015 18:47   Dg Chest Port 1 View  11/13/2015  CLINICAL DATA:  Respiratory failure. EXAM: PORTABLE CHEST 1 VIEW COMPARISON:  11/11/2015, 11/10/2015 and 08/13/2015 FINDINGS: The lungs are hyperinflated consistent with emphysema. There is increased density in the area of the left ventricle but the silhouette of the hemidiaphragm is not lost. It could be an area of atelectasis or infiltrate behind the left ventricle. Heart size and vascularity are normal. Calcification in the thoracic aorta. IMPRESSION: Emphysema. Possible focal infiltrate or atelectasis at the left base. Electronically Signed   By: Lorriane Shire M.D.   On: 11/13/2015 08:36   Dg Chest Portable 1 View  11/11/2015  CLINICAL DATA:  Shortness of breath, congestion. EXAM: PORTABLE CHEST 1 VIEW COMPARISON:  11/10/2015 FINDINGS: Heart is borderline in size. Left basilar opacity likely reflects atelectasis. No confluent opacity on the right. No visible effusions or acute bony abnormality. Aortic calcifications in the arch. IMPRESSION: Borderline heart size. Left base opacity likely reflects atelectasis. Electronically Signed   By: Rolm Baptise M.D.   On: 11/11/2015 11:42    Medications:  Prior to Admission:  Prescriptions prior to admission  Medication Sig Dispense Refill Last Dose  . albuterol (PROVENTIL) (2.5 MG/3ML) 0.083% nebulizer solution Take 2.5 mg by nebulization 3 (three) times daily.   11/11/2015 at Unknown time  . alendronate (FOSAMAX) 70 MG tablet Take 70 mg by mouth every Friday. Take with a full glass of water on an empty stomach.   Past Week at Unknown time  . ALPRAZolam (XANAX) 1 MG tablet Take one tablet by mouth at bedtime for anxiety (Patient taking differently: Take 1 mg by mouth 3 (three) times daily as needed for anxiety. Take one tablet by mouth at bedtime for anxiety) 30  tablet 5 11/11/2015 at Unknown time  . diltiazem (TIAZAC) 240 MG 24 hr capsule Take 240 mg by mouth every morning.    11/11/2015 at Unknown time  . ferrous sulfate 325 (65 FE) MG tablet Take 325 mg by mouth daily.   11/11/2015 at Unknown time  . FLUoxetine (PROZAC) 20 MG capsule Take 20 mg by mouth daily.   11/11/2015 at Unknown time  . fluticasone (FLONASE) 50 MCG/ACT nasal spray Place 2 sprays into the nose daily.   11/10/2015 at Unknown time  . furosemide (LASIX) 40 MG tablet Take 1 tablet (40 mg total) by mouth every morning. As needed for edema (Patient taking differently: Take 40 mg by mouth daily. ) 30 tablet 5 11/10/2015 at Unknown time  . guaiFENesin (ROBITUSSIN) 100 MG/5ML liquid Take 300 mg by mouth every 6 (six) hours as needed for cough.    Past Week at Unknown time  . HYDROcodone-acetaminophen (NORCO/VICODIN) 5-325 MG tablet Take one tablet by mouth every 6 hours as needed for pain. Max APAP 3gm/24 hours from all sources 120 tablet 0 Past Week at Unknown time  . magnesium hydroxide (MILK OF MAGNESIA) 400 MG/5ML suspension Take 30 mLs by mouth daily as needed for mild constipation.   Past Week at Unknown time  . Multiple Vitamin (MULTIVITAMIN WITH MINERALS) TABS tablet Take 1 tablet by mouth daily.   11/11/2015 at Unknown time  .  pantoprazole (PROTONIX) 40 MG tablet Take 1 tablet (40 mg total) by mouth 2 (two) times daily before a meal. 60 tablet 12 11/11/2015 at Unknown time  . predniSONE (DELTASONE) 10 MG tablet Take 1 tablet (10 mg total) by mouth daily with breakfast. 30 tablet 0 11/11/2015 at Unknown time  . risperiDONE (RISPERDAL) 0.5 MG tablet Take 0.5 mg by mouth at bedtime.   11/10/2015 at Unknown time  . tiotropium (SPIRIVA) 18 MCG inhalation capsule Place 18 mcg into inhaler and inhale every morning.   11/11/2015 at Unknown time   Scheduled: . albuterol  2.5 mg Nebulization TID  . Chlorhexidine Gluconate Cloth  6 each Topical Q0600  . diltiazem  240 mg Oral q morning - 10a   . enoxaparin (LOVENOX) injection  40 mg Subcutaneous Q24H  . ferrous sulfate  325 mg Oral Daily  . FLUoxetine  20 mg Oral Daily  . fluticasone  2 spray Each Nare Daily  . furosemide  40 mg Oral q morning - 10a  . levofloxacin (LEVAQUIN) IV  750 mg Intravenous Q48H  . methylPREDNISolone (SOLU-MEDROL) injection  80 mg Intravenous Q12H  . mupirocin ointment  1 application Nasal BID  . pantoprazole  40 mg Oral BID AC  . risperiDONE  0.5 mg Oral QHS  . sodium chloride  3 mL Intravenous Q12H   Continuous: . sodium chloride 100 mL/hr at 11/13/15 0701   [id number], guaiFENesin, morphine CONCENTRATE, ondansetron **OR** ondansetron (ZOFRAN) IV  Assesment: She was admitted with COPD exacerbation and acute hypercapnic respiratory failure. She has had palliative care discussion and although she wants to continue with treatment for her acute illness she does not want intubation, CPR, or BiPAP. I think she has pneumonia although that was not definitive on her chest x-ray. Her white blood count has come down from 40,000-24,000. Although her blood gases not much better she looks much better.  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 She's been having a lot more trouble over the last several months  She has acute renal insufficiency  She has anxiety and depression/bipolar disease and is having more trouble with anxiety Active Problems:   Acute renal insufficiency   Essential hypertension   COPD (chronic obstructive pulmonary disease) (HCC)   Acute respiratory failure with hypercapnia (HCC)   COPD exacerbation ([place])   Palliative care encounter   DNR (do not resuscitate) discussion    Plan: Continue treatments. Transfer out of ICU. We will of course honor her wishes    LOS: 2 days   Celsey Asselin L 11/13/2015, 9:27 AM

## 2015-11-14 LAB — BASIC METABOLIC PANEL
Anion gap: 5 (ref 5–15)
BUN: 43 mg/dL — AB (ref 6–20)
CALCIUM: 7.8 mg/dL — AB (ref 8.9–10.3)
CO2: 39 mmol/L — ABNORMAL HIGH (ref 22–32)
CREATININE: 1.08 mg/dL — AB (ref 0.44–1.00)
Chloride: 101 mmol/L (ref 101–111)
GFR calc Af Amer: 57 mL/min — ABNORMAL LOW (ref >60)
GFR, EST NON AFRICAN AMERICAN: 49 mL/min — AB (ref >60)
GLUCOSE: 142 mg/dL — AB (ref 65–99)
Potassium: 4.5 mmol/L (ref 3.5–5.1)
Sodium: 145 mmol/L (ref 135–145)

## 2015-11-14 LAB — CBC WITH DIFFERENTIAL/PLATELET
BASOS ABS: 0 10*3/uL (ref 0.0–0.1)
Basophils Relative: 0 %
EOS PCT: 0 %
Eosinophils Absolute: 0 10*3/uL (ref 0.0–0.7)
HCT: 33.3 % — ABNORMAL LOW (ref 36.0–46.0)
Hemoglobin: 9.9 g/dL — ABNORMAL LOW (ref 12.0–15.0)
LYMPHS PCT: 2 %
Lymphs Abs: 0.3 10*3/uL — ABNORMAL LOW (ref 0.7–4.0)
MCH: 31.6 pg (ref 26.0–34.0)
MCHC: 29.7 g/dL — ABNORMAL LOW (ref 30.0–36.0)
MCV: 106.4 fL — AB (ref 78.0–100.0)
MONO ABS: 0.4 10*3/uL (ref 0.1–1.0)
Monocytes Relative: 3 %
Neutro Abs: 13.4 10*3/uL — ABNORMAL HIGH (ref 1.7–7.7)
Neutrophils Relative %: 95 %
Platelets: 225 10*3/uL (ref 150–400)
RBC: 3.13 MIL/uL — ABNORMAL LOW (ref 3.87–5.11)
RDW: 15.6 % — AB (ref 11.5–15.5)
WBC: 14.2 10*3/uL — ABNORMAL HIGH (ref 4.0–10.5)

## 2015-11-14 MED ORDER — ALPRAZOLAM 0.5 MG PO TABS
0.5000 mg | ORAL_TABLET | Freq: Three times a day (TID) | ORAL | Status: DC | PRN
  Administered 2015-11-15: 0.5 mg via ORAL
  Filled 2015-11-14 (×2): qty 1

## 2015-11-14 MED ORDER — PREDNISONE 20 MG PO TABS
40.0000 mg | ORAL_TABLET | Freq: Every day | ORAL | Status: DC
  Administered 2015-11-14 – 2015-11-16 (×3): 40 mg via ORAL
  Filled 2015-11-14: qty 2
  Filled 2015-11-14: qty 4
  Filled 2015-11-14: qty 2

## 2015-11-14 NOTE — Progress Notes (Signed)
Called report to 300 RN that will be taking care of patient in room 331. Will take up on bed we have in hall.

## 2015-11-14 NOTE — Progress Notes (Signed)
Dr Luan Pulling called about change in patient's LOC and responsive only to painful stimuli; no new orders received; will continue to monitor and document any changes in patient condition

## 2015-11-14 NOTE — Progress Notes (Signed)
Ms. Shires IV access occluded due to being in antecubital space. Attempted 2 time with ultra sound and failed. Terence Lux RN attempted regular placement but failed.

## 2015-11-14 NOTE — Progress Notes (Signed)
IV access was lost, attempted to restart. Called Dr. Luan Pulling he ordered to leave IV out at this time change IV steroids to po.

## 2015-11-14 NOTE — Progress Notes (Signed)
Subjective: She had problems with being poorly responsive after Xanax yesterday. I had increased her Xanax because of her complaints of increasing anxiety. Even though she's been on Xanax for at least 20 years it appears that that made her respiratory failure worse. I discussed that with her. She's a little confused this morning. She is awake and alert.  Objective: Vital signs in last 24 hours: Temp:  [96.6 F (35.9 C)-98.4 F (36.9 C)] 96.6 F (35.9 C) (01/01 0803) Pulse Rate:  [58-80] 77 (01/01 0900) Resp:  [13-28] 20 (01/01 0900) BP: (90-135)/(48-100) 135/95 mmHg (01/01 0900) SpO2:  [87 %-100 %] 97 % (01/01 0900) Weight:  [73.6 kg (162 lb 4.1 oz)] 73.6 kg (162 lb 4.1 oz) (01/01 0500) Weight change: 0.8 kg (1 lb 12.2 oz) Last BM Date:  (unknown)  Intake/Output from previous day: 12/31 0701 - 01/01 0700 In: 3173 [P.O.:720; I.V.:2303; IV Piggyback:150] Out: 1125 [Urine:1125]  PHYSICAL EXAM General appearance: alert, cooperative and mild distress Resp: Severely diminished breath sounds bilaterally Cardio: regular rate and rhythm, S1, S2 normal, no murmur, click, rub or gallop GI: soft, non-tender; bowel sounds normal; no masses,  no organomegaly Extremities: extremities normal, atraumatic, no cyanosis or edema  Lab Results:  Results for orders placed or performed during the hospital encounter of 11/11/15 (from the past 48 hour(s))  CBC with Differential/Platelet     Status: Abnormal   Collection Time: 11/13/15  4:30 AM  Result Value Ref Range   WBC 24.0 (H) 4.0 - 10.5 K/uL   RBC 2.84 (L) 3.87 - 5.11 MIL/uL   Hemoglobin 8.9 (L) 12.0 - 15.0 g/dL   HCT 29.1 (L) 36.0 - 46.0 %   MCV 102.5 (H) 78.0 - 100.0 fL   MCH 31.3 26.0 - 34.0 pg   MCHC 30.6 30.0 - 36.0 g/dL   RDW 15.8 (H) 11.5 - 15.5 %   Platelets 233 150 - 400 K/uL   Neutrophils Relative % 97 %   Neutro Abs 23.4 (H) 1.7 - 7.7 K/uL   Lymphocytes Relative 1 %   Lymphs Abs 0.3 (L) 0.7 - 4.0 K/uL   Monocytes Relative 2 %    Monocytes Absolute 0.4 0.1 - 1.0 K/uL   Eosinophils Relative 0 %   Eosinophils Absolute 0.0 0.0 - 0.7 K/uL   Basophils Relative 0 %   Basophils Absolute 0.0 0.0 - 0.1 K/uL  Basic metabolic panel     Status: Abnormal   Collection Time: 11/13/15  4:30 AM  Result Value Ref Range   Sodium 141 135 - 145 mmol/L   Potassium 3.7 3.5 - 5.1 mmol/L    Comment: DELTA CHECK NOTED   Chloride 96 (L) 101 - 111 mmol/L   CO2 39 (H) 22 - 32 mmol/L   Glucose, Bld 153 (H) 65 - 99 mg/dL   BUN 40 (H) 6 - 20 mg/dL   Creatinine, Ser 1.06 (H) 0.44 - 1.00 mg/dL   Calcium 8.2 (L) 8.9 - 10.3 mg/dL   GFR calc non Af Amer 50 (L) >60 mL/min   GFR calc Af Amer 58 (L) >60 mL/min    Comment: (NOTE) The eGFR has been calculated using the CKD EPI equation. This calculation has not been validated in all clinical situations. eGFR's persistently <60 mL/min signify possible Chronic Kidney Disease.    Anion gap 6 5 - 15  Blood gas, arterial     Status: Abnormal   Collection Time: 11/13/15  5:15 AM  Result Value Ref Range   O2  Content 4.0 L/min   Delivery systems NASAL CANNULA    pH, Arterial 7.298 (L) 7.350 - 7.450   pCO2 arterial 85.1 (HH) 35.0 - 45.0 mmHg    Comment: CRITICAL RESULT CALLED TO, READ BACK BY AND VERIFIED WITH: Gershon Cull RN AT [phone] 11/13/15 ANDERSON,S RRT    pO2, Arterial 118 (H) 80.0 - 100.0 mmHg   Bicarbonate 36.0 (H) 20.0 - 24.0 mEq/L   TCO2 12.2 0 - 100 mmol/L   Acid-Base Excess 13.6 (H) 0.0 - 2.0 mmol/L   O2 Saturation 98.0 %   Collection site RIGHT RADIAL    Drawn by [id number]    Sample type ARTERIAL    Allens test (pass/fail) PASS PASS  CBC with Differential/Platelet     Status: Abnormal   Collection Time: 11/14/15  6:05 AM  Result Value Ref Range   WBC 14.2 (H) 4.0 - 10.5 K/uL   RBC 3.13 (L) 3.87 - 5.11 MIL/uL   Hemoglobin 9.9 (L) 12.0 - 15.0 g/dL   HCT 33.3 (L) 36.0 - 46.0 %   MCV 106.4 (H) 78.0 - 100.0 fL   MCH 31.6 26.0 - 34.0 pg   MCHC 29.7 (L) 30.0 - 36.0 g/dL   RDW 15.6  (H) 11.5 - 15.5 %   Platelets 225 150 - 400 K/uL   Neutrophils Relative % 95 %   Neutro Abs 13.4 (H) 1.7 - 7.7 K/uL   Lymphocytes Relative 2 %   Lymphs Abs 0.3 (L) 0.7 - 4.0 K/uL   Monocytes Relative 3 %   Monocytes Absolute 0.4 0.1 - 1.0 K/uL   Eosinophils Relative 0 %   Eosinophils Absolute 0.0 0.0 - 0.7 K/uL   Basophils Relative 0 %   Basophils Absolute 0.0 0.0 - 0.1 K/uL  Basic metabolic panel     Status: Abnormal   Collection Time: 11/14/15  6:05 AM  Result Value Ref Range   Sodium 145 135 - 145 mmol/L   Potassium 4.5 3.5 - 5.1 mmol/L    Comment: DELTA CHECK NOTED   Chloride 101 101 - 111 mmol/L   CO2 39 (H) 22 - 32 mmol/L   Glucose, Bld 142 (H) 65 - 99 mg/dL   BUN 43 (H) 6 - 20 mg/dL   Creatinine, Ser 1.08 (H) 0.44 - 1.00 mg/dL   Calcium 7.8 (L) 8.9 - 10.3 mg/dL   GFR calc non Af Amer 49 (L) >60 mL/min   GFR calc Af Amer 57 (L) >60 mL/min    Comment: (NOTE) The eGFR has been calculated using the CKD EPI equation. This calculation has not been validated in all clinical situations. eGFR's persistently <60 mL/min signify possible Chronic Kidney Disease.    Anion gap 5 5 - 15    ABGS  Recent Labs  11/13/15 0515  PHART 7.298*  PO2ART 118*  TCO2 12.2  HCO3 36.0*   CULTURES Recent Results (from the past 240 hour(s))  MRSA PCR Screening     Status: Abnormal   Collection Time: 11/11/15  8:17 PM  Result Value Ref Range Status   MRSA by PCR POSITIVE (A) NEGATIVE Final    Comment:        The GeneXpert MRSA Assay (FDA approved for NASAL specimens only), is one component of a comprehensive MRSA colonization surveillance program. It is not intended to diagnose MRSA infection nor to guide or monitor treatment for MRSA infections. RESULT CALLED TO, READ BACK BY AND VERIFIED WITH: DANIELS,J ON 11/11/15 AT 2205 BY LOY,C    Studies/Results:  Dg Chest Port 1 View  11/13/2015  CLINICAL DATA:  Respiratory failure. EXAM: PORTABLE CHEST 1 VIEW COMPARISON:  11/11/2015,  11/10/2015 and 08/13/2015 FINDINGS: The lungs are hyperinflated consistent with emphysema. There is increased density in the area of the left ventricle but the silhouette of the hemidiaphragm is not lost. It could be an area of atelectasis or infiltrate behind the left ventricle. Heart size and vascularity are normal. Calcification in the thoracic aorta. IMPRESSION: Emphysema. Possible focal infiltrate or atelectasis at the left base. Electronically Signed   By: Lorriane Shire M.D.   On: 11/13/2015 08:36    Medications:  Prior to Admission:  Prescriptions prior to admission  Medication Sig Dispense Refill Last Dose  . albuterol (PROVENTIL) (2.5 MG/3ML) 0.083% nebulizer solution Take 2.5 mg by nebulization 3 (three) times daily.   11/11/2015 at Unknown time  . alendronate (FOSAMAX) 70 MG tablet Take 70 mg by mouth every Friday. Take with a full glass of water on an empty stomach.   Past Week at Unknown time  . ALPRAZolam (XANAX) 1 MG tablet Take one tablet by mouth at bedtime for anxiety (Patient taking differently: Take 1 mg by mouth 3 (three) times daily as needed for anxiety. Take one tablet by mouth at bedtime for anxiety) 30 tablet 5 11/11/2015 at Unknown time  . diltiazem (TIAZAC) 240 MG 24 hr capsule Take 240 mg by mouth every morning.    11/11/2015 at Unknown time  . ferrous sulfate 325 (65 FE) MG tablet Take 325 mg by mouth daily.   11/11/2015 at Unknown time  . FLUoxetine (PROZAC) 20 MG capsule Take 20 mg by mouth daily.   11/11/2015 at Unknown time  . fluticasone (FLONASE) 50 MCG/ACT nasal spray Place 2 sprays into the nose daily.   11/10/2015 at Unknown time  . furosemide (LASIX) 40 MG tablet Take 1 tablet (40 mg total) by mouth every morning. As needed for edema (Patient taking differently: Take 40 mg by mouth daily. ) 30 tablet 5 11/10/2015 at Unknown time  . guaiFENesin (ROBITUSSIN) 100 MG/5ML liquid Take 300 mg by mouth every 6 (six) hours as needed for cough.    Past Week at Unknown  time  . HYDROcodone-acetaminophen (NORCO/VICODIN) 5-325 MG tablet Take one tablet by mouth every 6 hours as needed for pain. Max APAP 3gm/24 hours from all sources 120 tablet 0 Past Week at Unknown time  . magnesium hydroxide (MILK OF MAGNESIA) 400 MG/5ML suspension Take 30 mLs by mouth daily as needed for mild constipation.   Past Week at Unknown time  . Multiple Vitamin (MULTIVITAMIN WITH MINERALS) TABS tablet Take 1 tablet by mouth daily.   11/11/2015 at Unknown time  . pantoprazole (PROTONIX) 40 MG tablet Take 1 tablet (40 mg total) by mouth 2 (two) times daily before a meal. 60 tablet 12 11/11/2015 at Unknown time  . predniSONE (DELTASONE) 10 MG tablet Take 1 tablet (10 mg total) by mouth daily with breakfast. 30 tablet 0 11/11/2015 at Unknown time  . risperiDONE (RISPERDAL) 0.5 MG tablet Take 0.5 mg by mouth at bedtime.   11/10/2015 at Unknown time  . tiotropium (SPIRIVA) 18 MCG inhalation capsule Place 18 mcg into inhaler and inhale every morning.   11/11/2015 at Unknown time   Scheduled: . albuterol  2.5 mg Nebulization TID  . Chlorhexidine Gluconate Cloth  6 each Topical Q0600  . diltiazem  240 mg Oral q morning - 10a  . enoxaparin (LOVENOX) injection  40 mg Subcutaneous Q24H  .  ferrous sulfate  325 mg Oral Daily  . FLUoxetine  20 mg Oral Daily  . fluticasone  2 spray Each Nare Daily  . furosemide  40 mg Oral q morning - 10a  . levofloxacin (LEVAQUIN) IV  750 mg Intravenous Q48H  . methylPREDNISolone (SOLU-MEDROL) injection  80 mg Intravenous Q12H  . mupirocin ointment  1 application Nasal BID  . pantoprazole  40 mg Oral BID AC  . risperiDONE  0.5 mg Oral QHS  . sodium chloride  3 mL Intravenous Q12H   Continuous: . sodium chloride 100 mL/hr at 11/14/15 0900   UYQ:[id number], guaiFENesin, morphine CONCENTRATE, ondansetron **OR** ondansetron (ZOFRAN) IV  Assesment: She was admitted with COPD exacerbation, healthcare associated pneumonia and acute on chronic respiratory failure.  She had been on BiPAP and has requested not to go back on BiPAP. She does want treatment for her COPD and respiratory failure but she wants [place] to make her comfortable at the same time. She does not want full-blown comfort measures at this point. At baseline she has severe oxygen dependent COPD and is severely short of breath.  She has anxiety and I have increased the dose of her benzodiazepine but I have cut that back and made it when necessary. She understands. She has some element of bipolar disease at baseline.   she had acute renal insufficiency which has improved.  She has hypertension which is at least reasonably well controlled.   Active Problems:   Hypertension   Bipolar disease, chronic (HCC)   Acute renal insufficiency   HCAP (healthcare-associated pneumonia)   Essential hypertension   COPD (chronic obstructive pulmonary disease) (HCC)   Depression with anxiety   Acute respiratory failure with hypercapnia (HCC)   COPD exacerbation ([place])   Palliative care encounter   DNR (do not resuscitate) discussion    Plan:Continue current treatments. She will continue treatment for her COPD exacerbation but she will not be placed on BiPAP and we will attempt to make her comfortable at the same time. She understands that trying to get her comfortable may result in her death    LOS: 3 days   Copelyn Widmer L 11/14/2015, 10:17 AM

## 2015-11-15 DIAGNOSIS — J189 Pneumonia, unspecified organism: Secondary | ICD-10-CM

## 2015-11-15 LAB — CBC WITH DIFFERENTIAL/PLATELET
BASOS ABS: 0 10*3/uL (ref 0.0–0.1)
BASOS PCT: 0 %
Eosinophils Absolute: 0 10*3/uL (ref 0.0–0.7)
Eosinophils Relative: 0 %
HEMATOCRIT: 34.8 % — AB (ref 36.0–46.0)
Hemoglobin: 10.1 g/dL — ABNORMAL LOW (ref 12.0–15.0)
Lymphocytes Relative: 4 %
Lymphs Abs: 0.4 10*3/uL — ABNORMAL LOW (ref 0.7–4.0)
MCH: 31.1 pg (ref 26.0–34.0)
MCHC: 29 g/dL — ABNORMAL LOW (ref 30.0–36.0)
MCV: 107.1 fL — ABNORMAL HIGH (ref 78.0–100.0)
MONO ABS: 0.5 10*3/uL (ref 0.1–1.0)
Monocytes Relative: 4 %
Neutro Abs: 9.7 10*3/uL — ABNORMAL HIGH (ref 1.7–7.7)
Neutrophils Relative %: 92 %
Platelets: 246 10*3/uL (ref 150–400)
RBC: 3.25 MIL/uL — ABNORMAL LOW (ref 3.87–5.11)
RDW: 15.4 % (ref 11.5–15.5)
WBC: 10.6 10*3/uL — ABNORMAL HIGH (ref 4.0–10.5)

## 2015-11-15 LAB — BASIC METABOLIC PANEL
ANION GAP: 6 (ref 5–15)
BUN: 49 mg/dL — ABNORMAL HIGH (ref 6–20)
CALCIUM: 8 mg/dL — AB (ref 8.9–10.3)
CO2: 41 mmol/L — AB (ref 22–32)
Chloride: 100 mmol/L — ABNORMAL LOW (ref 101–111)
Creatinine, Ser: 1.26 mg/dL — ABNORMAL HIGH (ref 0.44–1.00)
GFR, EST AFRICAN AMERICAN: 47 mL/min — AB (ref >60)
GFR, EST NON AFRICAN AMERICAN: 41 mL/min — AB (ref >60)
Glucose, Bld: 166 mg/dL — ABNORMAL HIGH (ref 65–99)
Potassium: 4.7 mmol/L (ref 3.5–5.1)
Sodium: 147 mmol/L — ABNORMAL HIGH (ref 135–145)

## 2015-11-15 NOTE — Progress Notes (Signed)
Subjective: She is a little better. She is still very short of breath.  Objective: Vital signs in last 24 hours: Temp:  [97.5 F (36.4 C)-97.9 F (36.6 C)] 97.8 F (36.6 C) (01/02 0635) Pulse Rate:  [65-89] 76 (01/02 0822) Resp:  [17-23] 23 (01/02 0822) BP: (108-138)/(54-79) 122/77 mmHg (01/02 0635) SpO2:  [94 %-100 %] 99 % (01/02 0822) FiO2 (%):  [42 %] 42 % (01/01 1400) Weight change:  Last BM Date: 11/12/15  Intake/Output from previous day: 01/01 0701 - 01/02 0700 In: 2240 [P.O.:840; I.V.:1400] Out: 500 [Urine:500]  PHYSICAL EXAM General appearance: alert, cooperative and moderate distress Resp: rhonchi bilaterally Cardio: regular rate and rhythm, S1, S2 normal, no murmur, click, rub or gallop GI: soft, non-tender; bowel sounds normal; no masses,  no organomegaly Extremities: extremities normal, atraumatic, no cyanosis or edema  Lab Results:  Results for orders placed or performed during the hospital encounter of 11/11/15 (from the past 48 hour(s))  CBC with Differential/Platelet     Status: Abnormal   Collection Time: 11/14/15  6:05 AM  Result Value Ref Range   WBC 14.2 (H) 4.0 - 10.5 K/uL   RBC 3.13 (L) 3.87 - 5.11 MIL/uL   Hemoglobin 9.9 (L) 12.0 - 15.0 g/dL   HCT 33.3 (L) 36.0 - 46.0 %   MCV 106.4 (H) 78.0 - 100.0 fL   MCH 31.6 26.0 - 34.0 pg   MCHC 29.7 (L) 30.0 - 36.0 g/dL   RDW 15.6 (H) 11.5 - 15.5 %   Platelets 225 150 - 400 K/uL   Neutrophils Relative % 95 %   Neutro Abs 13.4 (H) 1.7 - 7.7 K/uL   Lymphocytes Relative 2 %   Lymphs Abs 0.3 (L) 0.7 - 4.0 K/uL   Monocytes Relative 3 %   Monocytes Absolute 0.4 0.1 - 1.0 K/uL   Eosinophils Relative 0 %   Eosinophils Absolute 0.0 0.0 - 0.7 K/uL   Basophils Relative 0 %   Basophils Absolute 0.0 0.0 - 0.1 K/uL  Basic metabolic panel     Status: Abnormal   Collection Time: 11/14/15  6:05 AM  Result Value Ref Range   Sodium 145 135 - 145 mmol/L   Potassium 4.5 3.5 - 5.1 mmol/L    Comment: DELTA CHECK NOTED    Chloride 101 101 - 111 mmol/L   CO2 39 (H) 22 - 32 mmol/L   Glucose, Bld 142 (H) 65 - 99 mg/dL   BUN 43 (H) 6 - 20 mg/dL   Creatinine, Ser 1.08 (H) 0.44 - 1.00 mg/dL   Calcium 7.8 (L) 8.9 - 10.3 mg/dL   GFR calc non Af Amer 49 (L) >60 mL/min   GFR calc Af Amer 57 (L) >60 mL/min    Comment: (NOTE) The eGFR has been calculated using the CKD EPI equation. This calculation has not been validated in all clinical situations. eGFR's persistently <60 mL/min signify possible Chronic Kidney Disease.    Anion gap 5 5 - 15  CBC with Differential/Platelet     Status: Abnormal   Collection Time: 11/15/15  6:18 AM  Result Value Ref Range   WBC 10.6 (H) 4.0 - 10.5 K/uL   RBC 3.25 (L) 3.87 - 5.11 MIL/uL   Hemoglobin 10.1 (L) 12.0 - 15.0 g/dL   HCT 34.8 (L) 36.0 - 46.0 %   MCV 107.1 (H) 78.0 - 100.0 fL   MCH 31.1 26.0 - 34.0 pg   MCHC 29.0 (L) 30.0 - 36.0 g/dL   RDW 15.4 11.5 -  15.5 %   Platelets 246 150 - 400 K/uL   Neutrophils Relative % 92 %   Neutro Abs 9.7 (H) 1.7 - 7.7 K/uL   Lymphocytes Relative 4 %   Lymphs Abs 0.4 (L) 0.7 - 4.0 K/uL   Monocytes Relative 4 %   Monocytes Absolute 0.5 0.1 - 1.0 K/uL   Eosinophils Relative 0 %   Eosinophils Absolute 0.0 0.0 - 0.7 K/uL   Basophils Relative 0 %   Basophils Absolute 0.0 0.0 - 0.1 K/uL  Basic metabolic panel     Status: Abnormal   Collection Time: 11/15/15  6:18 AM  Result Value Ref Range   Sodium 147 (H) 135 - 145 mmol/L   Potassium 4.7 3.5 - 5.1 mmol/L   Chloride 100 (L) 101 - 111 mmol/L   CO2 41 (H) 22 - 32 mmol/L   Glucose, Bld 166 (H) 65 - 99 mg/dL   BUN 49 (H) 6 - 20 mg/dL   Creatinine, Ser 1.26 (H) 0.44 - 1.00 mg/dL   Calcium 8.0 (L) 8.9 - 10.3 mg/dL   GFR calc non Af Amer 41 (L) >60 mL/min   GFR calc Af Amer 47 (L) >60 mL/min    Comment: (NOTE) The eGFR has been calculated using the CKD EPI equation. This calculation has not been validated in all clinical situations. eGFR's persistently <60 mL/min signify possible  Chronic Kidney Disease.    Anion gap 6 5 - 15    ABGS  Recent Labs  11/13/15 0515  PHART 7.298*  PO2ART 118*  TCO2 12.2  HCO3 36.0*   CULTURES Recent Results (from the past 240 hour(s))  MRSA PCR Screening     Status: Abnormal   Collection Time: 11/11/15  8:17 PM  Result Value Ref Range Status   MRSA by PCR POSITIVE (A) NEGATIVE Final    Comment:        The GeneXpert MRSA Assay (FDA approved for NASAL specimens only), is one component of a comprehensive MRSA colonization surveillance program. It is not intended to diagnose MRSA infection nor to guide or monitor treatment for MRSA infections. RESULT CALLED TO, READ BACK BY AND VERIFIED WITH: DANIELS,J ON 11/11/15 AT 2205 BY LOY,C    Studies/Results: No results found.  Medications:  Prior to Admission:  Prescriptions prior to admission  Medication Sig Dispense Refill Last Dose  . albuterol (PROVENTIL) (2.5 MG/3ML) 0.083% nebulizer solution Take 2.5 mg by nebulization 3 (three) times daily.   11/11/2015 at Unknown time  . alendronate (FOSAMAX) 70 MG tablet Take 70 mg by mouth every Friday. Take with a full glass of water on an empty stomach.   Past Week at Unknown time  . ALPRAZolam (XANAX) 1 MG tablet Take one tablet by mouth at bedtime for anxiety (Patient taking differently: Take 1 mg by mouth 3 (three) times daily as needed for anxiety. Take one tablet by mouth at bedtime for anxiety) 30 tablet 5 11/11/2015 at Unknown time  . diltiazem (TIAZAC) 240 MG 24 hr capsule Take 240 mg by mouth every morning.    11/11/2015 at Unknown time  . ferrous sulfate 325 (65 FE) MG tablet Take 325 mg by mouth daily.   11/11/2015 at Unknown time  . FLUoxetine (PROZAC) 20 MG capsule Take 20 mg by mouth daily.   11/11/2015 at Unknown time  . fluticasone (FLONASE) 50 MCG/ACT nasal spray Place 2 sprays into the nose daily.   11/10/2015 at Unknown time  . furosemide (LASIX) 40 MG tablet Take 1  tablet (40 mg total) by mouth every morning. As  needed for edema (Patient taking differently: Take 40 mg by mouth daily. ) 30 tablet 5 11/10/2015 at Unknown time  . guaiFENesin (ROBITUSSIN) 100 MG/5ML liquid Take 300 mg by mouth every 6 (six) hours as needed for cough.    Past Week at Unknown time  . HYDROcodone-acetaminophen (NORCO/VICODIN) 5-325 MG tablet Take one tablet by mouth every 6 hours as needed for pain. Max APAP 3gm/24 hours from all sources 120 tablet 0 Past Week at Unknown time  . magnesium hydroxide (MILK OF MAGNESIA) 400 MG/5ML suspension Take 30 mLs by mouth daily as needed for mild constipation.   Past Week at Unknown time  . Multiple Vitamin (MULTIVITAMIN WITH MINERALS) TABS tablet Take 1 tablet by mouth daily.   11/11/2015 at Unknown time  . pantoprazole (PROTONIX) 40 MG tablet Take 1 tablet (40 mg total) by mouth 2 (two) times daily before a meal. 60 tablet 12 11/11/2015 at Unknown time  . predniSONE (DELTASONE) 10 MG tablet Take 1 tablet (10 mg total) by mouth daily with breakfast. 30 tablet 0 11/11/2015 at Unknown time  . risperiDONE (RISPERDAL) 0.5 MG tablet Take 0.5 mg by mouth at bedtime.   11/10/2015 at Unknown time  . tiotropium (SPIRIVA) 18 MCG inhalation capsule Place 18 mcg into inhaler and inhale every morning.   11/11/2015 at Unknown time   Scheduled: . albuterol  2.5 mg Nebulization TID  . Chlorhexidine Gluconate Cloth  6 each Topical Q0600  . diltiazem  240 mg Oral q morning - 10a  . enoxaparin (LOVENOX) injection  40 mg Subcutaneous Q24H  . ferrous sulfate  325 mg Oral Daily  . FLUoxetine  20 mg Oral Daily  . fluticasone  2 spray Each Nare Daily  . furosemide  40 mg Oral q morning - 10a  . levofloxacin (LEVAQUIN) IV  750 mg Intravenous Q48H  . mupirocin ointment  1 application Nasal BID  . pantoprazole  40 mg Oral BID AC  . predniSONE  40 mg Oral Q breakfast  . risperiDONE  0.5 mg Oral QHS  . sodium chloride  3 mL Intravenous Q12H   Continuous: . sodium chloride 100 mL/hr at 11/14/15 1400    PFX:[id number], guaiFENesin, morphine CONCENTRATE, ondansetron **OR** ondansetron (ZOFRAN) IV  Assesment: She has healthcare associated pneumonia and acute hypercapnic respiratory failure. She is end-stage with her COPD.  She had acute renal insufficiency which is better.  She has anxiety and depression/bipolar disease but is competent to make decisions on her own behalf Active Problems:   Hypertension   Bipolar disease, chronic ([place])   Acute renal insufficiency   HCAP (healthcare-associated pneumonia)   Essential hypertension   COPD (chronic obstructive pulmonary disease) ([place])   Depression with anxiety   Acute respiratory failure with hypercapnia ([place])   COPD exacerbation ([place])   Palliative care encounter   DNR (do not resuscitate) discussion    Plan: Potential discharge back to the skilled care facility with hospice care tomorrow    LOS: 4 days   Calah Gershman L 11/15/2015, 11:51 AM

## 2015-11-15 NOTE — Progress Notes (Signed)
Daily Progress Note   Patient Name: April Pearson       Date: 11/15/2015 DOB: June 21, 1941  Age: 75 y.o. MRN#: [id number] Attending Physician: Sinda Du, MD Primary Care Physician: Alonza Bogus, MD Admit Date: 11/11/2015  Reason for Consultation/Follow-up: Psychosocial/spiritual support  Subjective: [name] is resting quietly in bed.  She greets me as I enter, making and keeping eye contact.  She appears to be improved today and shares that she is feeling better.  We talk about her return to the [facility].  She is still agreeable to hospice services, but again asks about the cost.  I share that she has already paid for this benefit with her Medicare.  She has no preference and agrees to [facility].  We talk about symptom management and comfort.   Length of Stay: 4 days  Current Medications: Scheduled Meds:  . albuterol  2.5 mg Nebulization TID  . Chlorhexidine Gluconate Cloth  6 each Topical Q0600  . diltiazem  240 mg Oral q morning - 10a  . enoxaparin (LOVENOX) injection  40 mg Subcutaneous Q24H  . ferrous sulfate  325 mg Oral Daily  . FLUoxetine  20 mg Oral Daily  . fluticasone  2 spray Each Nare Daily  . furosemide  40 mg Oral q morning - 10a  . levofloxacin (LEVAQUIN) IV  750 mg Intravenous Q48H  . mupirocin ointment  1 application Nasal BID  . pantoprazole  40 mg Oral BID AC  . predniSONE  40 mg Oral Q breakfast  . risperiDONE  0.5 mg Oral QHS  . sodium chloride  3 mL Intravenous Q12H    Continuous Infusions: . sodium chloride 100 mL/hr at 11/14/15 1400    PRN Meds: ALPRAZolam, guaiFENesin, morphine CONCENTRATE, ondansetron **OR** ondansetron (ZOFRAN) IV  Physical Exam: Physical Exam  Nursing note and vitals  reviewed. Constitutional: She is oriented to person, place, and time. She appears well-developed.  HENT: Head: Normocephalic and atraumatic.  Cardiovascular: Normal rate and regular rhythm.  Respiratory: even and non labored,   GI: Soft. Bowel sounds are normal. She exhibits no distension. There is no tenderness. There is no guarding.  Neurological: She is alert and oriented to person, place, and time.  Vital Signs: BP 132/58 mmHg  Pulse 72  Temp(Src) 98.5 F (36.9 C) (Oral)  Resp 18  Ht 5\' 7"  (1.702 m)  Wt 73.6 kg (162 lb 4.1 oz)  BMI 25.41 kg/m2  SpO2 94%  LMP  (LMP Unknown) SpO2: SpO2: 94 % O2 Device: O2 Device: Nasal Cannula O2 Flow Rate: O2 Flow Rate (L/min): 4 L/min  Intake/output summary:  Intake/Output Summary (Last 24 hours) at 11/15/15 1529 Last data filed at 11/15/15 1230  Gross per 24 hour  Intake    720 ml  Output    500 ml  Net    220 ml   LBM: Last BM Date: 11/12/15 Baseline Weight: Weight: 70.308 kg (155 lb) Most recent weight: Weight: 73.6 kg (162 lb 4.1 oz)       Palliative Assessment/Data: Flowsheet Rows        Most Recent Value   Intake Tab    Referral Department  Hospitalist   Unit at Time of Referral  ICU   Palliative Care Primary Diagnosis  Pulmonary   Date Notified  11/12/15   Reason for referral  Clarify Goals of Care, End of Life Care Assistance, Advance Care Planning   Date of Admission  11/11/15   Date first seen by Palliative Care  11/12/15   # of days Palliative referral response time  0 Day(s)   # of days IP prior to Palliative referral  1   Clinical Assessment    Palliative Performance Scale Score  40%   Pain Max last 24 hours  8   Pain Min Last 24 hours  5   Dyspnea Max Last 24 Hours  9   Dyspnea Min Last 24 hours  6   Psychosocial & Spiritual Assessment    Social Work Plan of Care  Staff support   Palliative Care Outcomes    Patient/Family meeting held?  Yes   Who was at the meeting?  sister [facility]     Palliative Care Outcomes  Clarified goals of care, Provided end of life care assistance, ACP counseling assistance, Provided advance care planning   Patient/Family wishes: Interventions discontinued/not started   Mechanical Ventilation, BiPAP   Palliative Care follow-up planned  Yes, Facility      Additional Data Reviewed: CBC    Component Value Date/Time   WBC 10.6* 11/15/2015 0618   RBC 3.25* 11/15/2015 0618   RBC 3.09* 07/28/2015 1501   HGB 10.1* 11/15/2015 0618   HCT 34.8* 11/15/2015 0618   PLT 246 11/15/2015 0618   MCV 107.1* 11/15/2015 0618   MCH 31.1 11/15/2015 0618   MCHC 29.0* 11/15/2015 0618   RDW 15.4 11/15/2015 0618   LYMPHSABS 0.4* 11/15/2015 0618   MONOABS 0.5 11/15/2015 0618   EOSABS 0.0 11/15/2015 0618   BASOSABS 0.0 11/15/2015 0618    CMP     Component Value Date/Time   NA 147* 11/15/2015 0618   K 4.7 11/15/2015 0618   CL 100* 11/15/2015 0618   CO2 41* 11/15/2015 0618   GLUCOSE 166* 11/15/2015 0618   BUN 49* 11/15/2015 0618   CREATININE 1.26* 11/15/2015 0618   CALCIUM 8.0* 11/15/2015 0618   PROT 6.3* 11/12/2015 0438   ALBUMIN 3.4* 11/12/2015 0438   AST 17 11/12/2015 0438   ALT 13* 11/12/2015 0438   ALKPHOS 73 11/12/2015 0438   BILITOT 0.5 11/12/2015 0438   GFRNONAA 41* 11/15/2015 0618   GFRAA 47* 11/15/2015 0618       Problem List:  Patient Active Problem List  Diagnosis Date Noted  . Palliative care encounter   . DNR (do not resuscitate) discussion   . COPD exacerbation ([place]) 11/11/2015  . Iron deficiency anemia due to chronic blood loss 11/06/2015  . Anemia due to chronic kidney disease 11/06/2015  . Anemia in chronic kidney disease 09/23/2015  . Heme + stool 06/07/2015  . Respiratory failure with hypercapnia ([place]) 03/14/2015  . Acute respiratory failure with hypercapnia ([place]) 03/14/2015  . Acute encephalopathy 03/14/2015  . Acute blood loss anemia 03/04/2015  . History of colonic polyps 03/04/2015  . Personal history of bladder  cancer 03/04/2015  . Dysphagia, pharyngoesophageal phase   . Anemia 03/01/2015  . GI bleed 03/01/2015  . Chest pain 03/01/2015  . AKI (acute kidney injury) ([place]) 03/01/2015  . Essential hypertension 03/01/2015  . COPD (chronic obstructive pulmonary disease) ([place]) 03/01/2015  . Depression with anxiety 03/01/2015  . Insomnia 03/01/2015  . Anemia 11/09/2014  . HCAP (healthcare-associated pneumonia) 11/09/2014  . Sepsis ([place]) 11/06/2014  . Acute renal insufficiency 11/06/2014  . Leukocytosis 11/06/2014  . Fever 11/06/2014  . Bladder cancer ([place]) 07/02/2014  . COPD with acute exacerbation ([place]) 01/26/2012  . Hypertension 01/26/2012  . Bipolar disease, chronic ([place]) 01/26/2012  . Tachycardia 01/26/2012     Palliative Care Assessment & Plan    1.Code Status:  DNR    Code Status Orders        Start     Ordered   11/12/15 0803  Do not attempt resuscitation (DNR)   Continuous    Question Answer Comment  In the event of cardiac or respiratory ARREST Do not call a "code blue"   In the event of cardiac or respiratory ARREST Do not perform Intubation, CPR, defibrillation or ACLS   In the event of cardiac or respiratory ARREST Use medication by any route, position, wound care, and other measures to relive pain and suffering. May use oxygen, suction and manual treatment of airway obstruction as needed for comfort.      11/12/15 0802    Advance Directive Documentation        Most Recent Value   Type of Advance Directive  Out of facility DNR (pink MOST or yellow form)   Pre-existing out of facility DNR order (yellow form or pink MOST form)     "MOST" Form in Place?         2. Goals of Care/Additional Recommendations:  Return to [facility].   Limitations on Scope of Treatment: Hospice "at home" [facility])  Desire for further Chaplaincy support:Not discussed today.   Psycho-social Needs: Referral to [employer]  and None at this time.   3.  Symptom Management:  Xanax 1 mg TID PRN  Zofran 4 mg PO/IV Q 6 hours PRN.  4. Palliative Prophylaxis:   Bowel Regimen and Frequent Pain Assessment  5. Prognosis: Unable to determine, likely less than 6 months.  Ms. Corts declines to use BiPAP even if this means she will die.   6. Discharge Planning:  [place] with Hospice   Care plan was discussed with nursing staff, CM, SW, and Dr. Luan Pulling on next rounds.   Thank you for allowing the Palliative Medicine Team to assist in the care of this patient.   Time In: 1440 Time Out: 1455 Total Time 15 minutes Prolonged Time Billed  no         Drue Novel, NP  11/15/2015, 3:29 PM  Please contact Palliative Medicine Team phone at  [phone] for questions and concerns.

## 2015-11-16 ENCOUNTER — Inpatient Hospital Stay
Admission: RE | Admit: 2015-11-16 | Discharge: 2016-01-12 | Disposition: E | Payer: Medicare Other | Source: Ambulatory Visit | Attending: Internal Medicine | Admitting: Internal Medicine

## 2015-11-16 ENCOUNTER — Other Ambulatory Visit (HOSPITAL_COMMUNITY): Payer: Self-pay

## 2015-11-16 LAB — CBC WITH DIFFERENTIAL/PLATELET
Basophils Absolute: 0 10*3/uL (ref 0.0–0.1)
Basophils Relative: 0 %
EOS PCT: 0 %
Eosinophils Absolute: 0 10*3/uL (ref 0.0–0.7)
HEMATOCRIT: 34.2 % — AB (ref 36.0–46.0)
Hemoglobin: 10.3 g/dL — ABNORMAL LOW (ref 12.0–15.0)
LYMPHS ABS: 1.3 10*3/uL (ref 0.7–4.0)
LYMPHS PCT: 11 %
MCH: 31.5 pg (ref 26.0–34.0)
MCHC: 30.1 g/dL (ref 30.0–36.0)
MCV: 104.6 fL — AB (ref 78.0–100.0)
MONO ABS: 0.6 10*3/uL (ref 0.1–1.0)
Monocytes Relative: 5 %
Neutro Abs: 10.1 10*3/uL — ABNORMAL HIGH (ref 1.7–7.7)
Neutrophils Relative %: 84 %
PLATELETS: 245 10*3/uL (ref 150–400)
RBC: 3.27 MIL/uL — AB (ref 3.87–5.11)
RDW: 15.3 % (ref 11.5–15.5)
WBC: 11.9 10*3/uL — ABNORMAL HIGH (ref 4.0–10.5)

## 2015-11-16 LAB — BASIC METABOLIC PANEL
Anion gap: 6 (ref 5–15)
BUN: 42 mg/dL — AB (ref 6–20)
CO2: 44 mmol/L — AB (ref 22–32)
Calcium: 8.4 mg/dL — ABNORMAL LOW (ref 8.9–10.3)
Chloride: 97 mmol/L — ABNORMAL LOW (ref 101–111)
Creatinine, Ser: 1.02 mg/dL — ABNORMAL HIGH (ref 0.44–1.00)
GFR calc Af Amer: >60 mL/min (ref >60)
GFR, EST NON AFRICAN AMERICAN: 53 mL/min — AB (ref >60)
GLUCOSE: 114 mg/dL — AB (ref 65–99)
POTASSIUM: 4 mmol/L (ref 3.5–5.1)
Sodium: 147 mmol/L — ABNORMAL HIGH (ref 135–145)

## 2015-11-16 MED ORDER — MORPHINE SULFATE (CONCENTRATE) 10 MG/0.5ML PO SOLN: 5.0000 mg | ORAL | Status: AC | PRN

## 2015-11-16 MED ORDER — LEVOFLOXACIN 750 MG PO TABS
750.0000 mg | ORAL_TABLET | ORAL | Status: AC
Start: 2015-11-16 — End: ?

## 2015-11-16 MED ORDER — PREDNISONE 10 MG PO TABS: 10.0000 mg | ORAL_TABLET | Freq: Every day | ORAL | Status: AC

## 2015-11-16 MED ORDER — ALPRAZOLAM 1 MG PO TABS: 1.0000 mg | ORAL_TABLET | Freq: Three times a day (TID) | ORAL | Status: AC | PRN

## 2015-11-16 NOTE — Progress Notes (Signed)
1020 Attempted x 3 phone calls to give report on patient to receiving nurse at [facility], message left with secretary to have nurse call this Probation officer.

## 2015-11-16 NOTE — Progress Notes (Signed)
She is overall a little better. She is still short of breath but I think she is back to baseline. She is ready for transfer back to skilled care facility with hospice

## 2015-11-16 NOTE — Discharge Summary (Signed)
Physician Discharge Summary  Patient ID: April Pearson MRN: [id number] DOB/AGE: [id number] y.o. Primary Care Physician:Renad Jenniges L, MD Admit date: 11/11/2015 Discharge date: 11/20/2015    Discharge Diagnoses:   Active Problems:   Hypertension   Bipolar disease, chronic (HCC)   Acute renal insufficiency   HCAP (healthcare-associated pneumonia)   Essential hypertension   COPD (chronic obstructive pulmonary disease) (HCC)   Depression with anxiety   Acute respiratory failure with hypercapnia (HCC)   COPD exacerbation ([place])   Palliative care encounter   DNR (do not resuscitate) discussion     Medication List    STOP taking these medications        alendronate 70 MG tablet  Commonly known as:  FOSAMAX      TAKE these medications        albuterol (2.5 MG/3ML) 0.083% nebulizer solution  Commonly known as:  PROVENTIL  Take 2.5 mg by nebulization 3 (three) times daily.     ALPRAZolam 1 MG tablet  Commonly known as:  XANAX  Take 1 tablet (1 mg total) by mouth 3 (three) times daily as needed for anxiety. Take one tablet by mouth at bedtime for anxiety     diltiazem 240 MG 24 hr capsule  Commonly known as:  TIAZAC  Take 240 mg by mouth every morning.     ferrous sulfate 325 (65 FE) MG tablet  Take 325 mg by mouth daily.     FLUoxetine 20 MG capsule  Commonly known as:  PROZAC  Take 20 mg by mouth daily.     fluticasone 50 MCG/ACT nasal spray  Commonly known as:  FLONASE  Place 2 sprays into the nose daily.     furosemide 40 MG tablet  Commonly known as:  LASIX  Take 1 tablet (40 mg total) by mouth every morning. As needed for edema     guaiFENesin 100 MG/5ML liquid  Commonly known as:  ROBITUSSIN  Take 300 mg by mouth every 6 (six) hours as needed for cough.     HYDROcodone-acetaminophen 5-325 MG tablet  Commonly known as:  NORCO/VICODIN  Take one tablet by mouth every 6 hours as needed for pain. Max APAP 3gm/24 hours from all sources     levofloxacin 750 MG tablet  Commonly known as:  LEVAQUIN  Take 1 tablet (750 mg total) by mouth every other day.     magnesium hydroxide 400 MG/5ML suspension  Commonly known as:  MILK OF MAGNESIA  Take 30 mLs by mouth daily as needed for mild constipation.     morphine CONCENTRATE 10 MG/0.5ML Soln concentrated solution  Take 0.25 mLs (5 mg total) by mouth every 2 (two) hours as needed for moderate pain or severe pain (DYSPNEA, see note).     multivitamin with minerals Tabs tablet  Take 1 tablet by mouth daily.     pantoprazole 40 MG tablet  Commonly known as:  PROTONIX  Take 1 tablet (40 mg total) by mouth 2 (two) times daily before a meal.     predniSONE 10 MG tablet  Commonly known as:  DELTASONE  Take 1 tablet (10 mg total) by mouth daily with breakfast.     risperiDONE 0.5 MG tablet  Commonly known as:  RISPERDAL  Take 0.5 mg by mouth at bedtime.     tiotropium 18 MCG inhalation capsule  Commonly known as:  SPIRIVA  Place 18 mcg into inhaler and inhale every morning.        Discharged Condition: Improved  Consults: Palliative care  Significant Diagnostic Studies: Dg Chest 1 View  11/10/2015  CLINICAL DATA:  Productive cough. EXAM: CHEST 1 VIEW COMPARISON:  10/10/2015 .  03/14/2015. FINDINGS: Mediastinum and hilar structures are normal. Cardiomegaly with normal pulmonary vascularity. Mild interstitial prominence noted bilaterally with subtle Kerley B-lines. A very mild component of congestive heart failure cannot be excluded . No overt pulmonary alveolar infiltrate. Small left pleural effusion. No pneumothorax . IMPRESSION: 1. Cardiomegaly with very subtle bilateral interstitial prominence with subtle Kerley B-lines. Very mild changes of congestive heart failure cannot be excluded. Tiny left pleural effusion cannot be excluded . 2. No focal alveolar infiltrate. Electronically Signed   By: Marcello Moores  Register   On: 11/10/2015 14:59   Ct Head Wo Contrast  11/11/2015   CLINICAL DATA:  75 year old female with altered mental status and respiratory failure. EXAM: CT HEAD WITHOUT CONTRAST TECHNIQUE: Contiguous axial images were obtained from the base of the skull through the vertex without intravenous contrast. COMPARISON:  Head CT dated 11/18/2013 FINDINGS: There is slight prominence of the ventricles and sulci compatible with age-related volume loss. Mild periventricular and deep white matter hypodensities represent chronic microvascular ischemic changes. There is no intracranial hemorrhage. No mass effect or midline shift identified. The visualized paranasal sinuses and mastoid air cells are well aerated. There is hyperostosis frontalis. Stable old right occipital calvarial osseous lesion, likely an exostosis. The calvarium is intact. IMPRESSION: No acute intracranial hemorrhage. Mild age-related atrophy and chronic microvascular ischemic disease. If symptoms persist and there are no contraindications, MRI may provide better evaluation if clinically indicated Electronically Signed   By: Anner Crete M.D.   On: 11/11/2015 18:47   Dg Chest Port 1 View  11/13/2015  CLINICAL DATA:  Respiratory failure. EXAM: PORTABLE CHEST 1 VIEW COMPARISON:  11/11/2015, 11/10/2015 and 08/13/2015 FINDINGS: The lungs are hyperinflated consistent with emphysema. There is increased density in the area of the left ventricle but the silhouette of the hemidiaphragm is not lost. It could be an area of atelectasis or infiltrate behind the left ventricle. Heart size and vascularity are normal. Calcification in the thoracic aorta. IMPRESSION: Emphysema. Possible focal infiltrate or atelectasis at the left base. Electronically Signed   By: Lorriane Shire M.D.   On: 11/13/2015 08:36   Dg Chest Portable 1 View  11/11/2015  CLINICAL DATA:  Shortness of breath, congestion. EXAM: PORTABLE CHEST 1 VIEW COMPARISON:  11/10/2015 FINDINGS: Heart is borderline in size. Left basilar opacity likely reflects  atelectasis. No confluent opacity on the right. No visible effusions or acute bony abnormality. Aortic calcifications in the arch. IMPRESSION: Borderline heart size. Left base opacity likely reflects atelectasis. Electronically Signed   By: Rolm Baptise M.D.   On: 11/11/2015 11:42    Lab Results: Basic Metabolic Panel:  Recent Labs  11/15/15 0618 11/24/2015 0638  NA 147* 147*  K 4.7 4.0  CL 100* 97*  CO2 41* 44*  GLUCOSE 166* 114*  BUN 49* 42*  CREATININE 1.26* 1.02*  CALCIUM 8.0* 8.4*   Liver Function Tests: No results for input(s): AST, ALT, ALKPHOS, BILITOT, PROT, ALBUMIN in the last 72 hours.   CBC:  Recent Labs  11/15/15 0618 12/11/2015 0638  WBC 10.6* 11.9*  NEUTROABS 9.7* 10.1*  HGB 10.1* 10.3*  HCT 34.8* 34.2*  MCV 107.1* 104.6*  PLT 246 245    Recent Results (from the past 240 hour(s))  MRSA PCR Screening     Status: Abnormal   Collection Time: 11/11/15  8:17 PM  Result Value Ref Range Status   MRSA by PCR POSITIVE (A) NEGATIVE Final    Comment:        The GeneXpert MRSA Assay (FDA approved for NASAL specimens only), is one component of a comprehensive MRSA colonization surveillance program. It is not intended to diagnose MRSA infection nor to guide or monitor treatment for MRSA infections. RESULT CALLED TO, READ BACK BY AND VERIFIED WITH: DANIELS,J ON 11/11/15 AT [facility]: This is a 75 year old who has a long known history of severe COPD. She has been having increasing problems with COPD over the last several months. She developed acute shortness of breath was noted to have healthcare associated pneumonia, COPD exacerbation and hypercapnic respiratory failure. She was admitted to the intensive care unit and started on BiPAP. She improved and because of her overall situation palliative care consult patient was undertaken. She has requested not to be put back on BiPAP. She is requesting hospice services as an outpatient back at  her skilled care facility she is back at baseline. At baseline she is short of breath with minimal exertion.  Discharge Exam: Blood pressure 128/71, pulse 74, temperature 98.5 F (36.9 C), temperature source Oral, resp. rate 18, height 5\' 7"  (1.702 m), weight 73.6 kg (162 lb 4.1 oz), SpO2 95 %. She is awake and alert. She is on oxygen. She still short of breath. Her chest shows decreased breath sounds  Disposition: Back to skilled care facility with hospice services. She will be on Levaquin 750 mg every other day for 5 doses. She will continue oxygen and nebulizer treatments etc. She will have morphine available.      Discharge Instructions    Discharge to SNF when bed available    Complete by:  As directed              Signed: Oprah Camarena L   11/20/2015, 8:58 AM

## 2015-11-16 NOTE — Progress Notes (Signed)
April Pearson with Asa Lente, LPN at [facility] and report on patient given to her.

## 2015-11-16 NOTE — Care Management Important Message (Signed)
Important Message  Patient Details  Name: April Pearson MRN: [id number] Date of Birth: 02-Apr-1941   Medicare Important Message Given:  Yes    Joylene Draft, RN 11/21/2015, 9:23 AM

## 2015-11-16 NOTE — Care Management Note (Signed)
Case Management Note  Patient Details  Name: April Pearson MRN: [id number] Date of Birth: 11-05-1941  Subjective/Objective:                    Action/Plan:   Expected Discharge Date:                  Expected Discharge Plan:  [place]  In-House Referral:  Clinical Social Work, Hospice / Palliative Care  Discharge planning Services  CM Consult  Post Acute Care Choice:  Hospice Choice offered to:  Patient  DME Arranged:    DME Agency:     HH Arranged:    [place] Agency:     Status of Service:  Completed, signed off  Medicare Important Message Given:  Yes Date Medicare IM Given:    Medicare IM give by:    Date Additional Medicare IM Given:    Additional Medicare Important Message give by:     If discussed at [place] of Stay Meetings, dates discussed:    Additional Comments: Pt discharged back to [facility] today with [facility] (per pts choice). CSW to arrange discharge to facility. CSW to contact [facility] and send information to them to follow pt at [facility]. No further CM needs noted. Christinia Gully [place], RN 11/14/2015, 9:24 AM

## 2015-11-16 NOTE — Clinical Social Work Note (Signed)
CSW facilitated discharge.  CSW notified Tami at [facility] that patient was being discharged and would be transported to the facility by [facility] staff.  CSW notified patient's sister, Daine Floras, of patient's discharge today and that patient would be transported to the facility at discharge.   CSW sent clinicals via [employer].  CSW signing off.    Ihor Gully, [place] [phone]

## 2015-11-17 DIAGNOSIS — J449 Chronic obstructive pulmonary disease, unspecified: Secondary | ICD-10-CM | POA: Diagnosis not present

## 2015-11-18 DIAGNOSIS — J449 Chronic obstructive pulmonary disease, unspecified: Secondary | ICD-10-CM | POA: Diagnosis not present

## 2015-11-19 ENCOUNTER — Other Ambulatory Visit (HOSPITAL_COMMUNITY): Payer: Self-pay | Admitting: Oncology

## 2015-11-19 DIAGNOSIS — J449 Chronic obstructive pulmonary disease, unspecified: Secondary | ICD-10-CM | POA: Diagnosis not present

## 2015-11-20 DIAGNOSIS — J449 Chronic obstructive pulmonary disease, unspecified: Secondary | ICD-10-CM | POA: Diagnosis not present

## 2015-11-21 DIAGNOSIS — N19 Unspecified kidney failure: Secondary | ICD-10-CM | POA: Diagnosis not present

## 2015-11-21 DIAGNOSIS — J9622 Acute and chronic respiratory failure with hypercapnia: Secondary | ICD-10-CM | POA: Diagnosis not present

## 2015-11-21 DIAGNOSIS — J449 Chronic obstructive pulmonary disease, unspecified: Secondary | ICD-10-CM | POA: Diagnosis not present

## 2015-11-21 DIAGNOSIS — J9621 Acute and chronic respiratory failure with hypoxia: Secondary | ICD-10-CM | POA: Diagnosis not present

## 2015-11-21 DIAGNOSIS — J441 Chronic obstructive pulmonary disease with (acute) exacerbation: Secondary | ICD-10-CM | POA: Diagnosis not present

## 2015-11-22 DIAGNOSIS — J449 Chronic obstructive pulmonary disease, unspecified: Secondary | ICD-10-CM | POA: Diagnosis not present

## 2015-11-23 DIAGNOSIS — J449 Chronic obstructive pulmonary disease, unspecified: Secondary | ICD-10-CM | POA: Diagnosis not present

## 2015-11-23 NOTE — H&P (Signed)
April Pearson, April Pearson              ACCOUNT NO.:  [id number]  MEDICAL RECORD NO.:  [id number]  LOCATION:  S116                          FACILITY:  [facility]  PHYSICIAN:  Genifer Lazenby L. Luan Pulling, M.D.DATE OF BIRTH:  07/22/1941  DATE OF ADMISSION:  12/04/2015 DATE OF DISCHARGE:  [facility]                             HISTORY & PHYSICAL   HISTORY:  April Pearson is a 75 year old, who has been living in a skilled care facility for about a year.  She has end-stage pulmonary disease and has been in and out of the hospital with that.  At her last hospitalization, she made her wishes known that she would not want to be back on BiPAP that said she would like to be treated for recurrent infections, but that she wanted her care to be tailored more towards comfort.  She understands that not going on BiPAP can ultimately cause her to die.  She says she has been having more and more trouble breathing and cannot get comfortable.  She is on oxygen.  She is on nebulizers.  She says her breathing is still bad.  She is still having a lot of trouble getting comfortable, but says when she takes her medications it helps.  PAST MEDICAL HISTORY:  Positive for osteoporosis, chronic tachycardia, hypertension, COPD, respiratory failure, reflux, bipolar disorder with anxiety and depression, bladder cancer, chronic anemia.  PAST SURGICAL HISTORY:  Surgically, she has had bladder surgery with several cystoscopies.  She had a benign breast tumor.  She had cataract surgery about 3 years ago.  She had colonoscopy and EGD with dilatation.  SOCIAL HISTORY:  She had previously lived in an assisted living facility and has been at the skilled care facility for about a year.  She quit smoking about 15 years ago, but has an approximately 80 pack year smoking history.  She does not use any alcohol or illicit drugs.  She has no known allergies.  FAMILY HISTORY:  Shows that her mother had hypertension and renal failure.  Her father died of  cancer, but had asthma and heart disease.  MEDICATIONS:  Reviewed.  REVIEW OF SYSTEMS:  She had some swelling of her legs.  Still has some abdominal discomfort.  PHYSICAL EXAMINATION:  GENERAL:  Shows a well-developed, well-nourished, anxious-appearing female, who is in no acute distress.  She is wearing oxygen.  She is sitting in her bed.  Her pupils are reactive.  Nose and throat are clear. NECK:  Supple without masses. CHEST:  Shows decreased breath sounds and prolonged expiratory phase. HEART:  Regular. ABDOMEN:  Soft without masses. EXTREMITIES:  Showed no edema. CENTRAL NERVOUS SYSTEM:  Grossly intact.  ASSESSMENT:  She has end-stage chronic obstructive pulmonary disease. She is on maximum medical treatment.  She requests to be treated for acute problems, but wants to lean more toward comfort care now.  That will be the plan.     April Pearson L. Luan Pulling, M.D.     ELH/MEDQ  D:  11/22/2015  T:  11/23/2015  Job:  [id number]

## 2015-11-24 ENCOUNTER — Other Ambulatory Visit (HOSPITAL_COMMUNITY): Payer: Medicare Other

## 2015-11-24 ENCOUNTER — Encounter (HOSPITAL_COMMUNITY): Payer: Medicare Other

## 2015-11-24 DIAGNOSIS — J449 Chronic obstructive pulmonary disease, unspecified: Secondary | ICD-10-CM | POA: Diagnosis not present

## 2015-11-25 DIAGNOSIS — J449 Chronic obstructive pulmonary disease, unspecified: Secondary | ICD-10-CM | POA: Diagnosis not present

## 2015-11-26 DIAGNOSIS — J449 Chronic obstructive pulmonary disease, unspecified: Secondary | ICD-10-CM | POA: Diagnosis not present

## 2015-11-27 DIAGNOSIS — J449 Chronic obstructive pulmonary disease, unspecified: Secondary | ICD-10-CM | POA: Diagnosis not present

## 2015-11-28 DIAGNOSIS — J449 Chronic obstructive pulmonary disease, unspecified: Secondary | ICD-10-CM | POA: Diagnosis not present

## 2015-11-29 DIAGNOSIS — J449 Chronic obstructive pulmonary disease, unspecified: Secondary | ICD-10-CM | POA: Diagnosis not present

## 2015-11-30 DIAGNOSIS — J449 Chronic obstructive pulmonary disease, unspecified: Secondary | ICD-10-CM | POA: Diagnosis not present

## 2015-12-01 DIAGNOSIS — J449 Chronic obstructive pulmonary disease, unspecified: Secondary | ICD-10-CM | POA: Diagnosis not present

## 2015-12-02 DIAGNOSIS — J449 Chronic obstructive pulmonary disease, unspecified: Secondary | ICD-10-CM | POA: Diagnosis not present

## 2015-12-03 DIAGNOSIS — J449 Chronic obstructive pulmonary disease, unspecified: Secondary | ICD-10-CM | POA: Diagnosis not present

## 2015-12-04 DIAGNOSIS — J449 Chronic obstructive pulmonary disease, unspecified: Secondary | ICD-10-CM | POA: Diagnosis not present

## 2015-12-05 DIAGNOSIS — J449 Chronic obstructive pulmonary disease, unspecified: Secondary | ICD-10-CM | POA: Diagnosis not present

## 2015-12-06 DIAGNOSIS — J449 Chronic obstructive pulmonary disease, unspecified: Secondary | ICD-10-CM | POA: Diagnosis not present

## 2015-12-07 DIAGNOSIS — J449 Chronic obstructive pulmonary disease, unspecified: Secondary | ICD-10-CM | POA: Diagnosis not present

## 2015-12-08 ENCOUNTER — Encounter (HOSPITAL_COMMUNITY): Payer: Medicare Other

## 2015-12-08 ENCOUNTER — Other Ambulatory Visit (HOSPITAL_COMMUNITY): Payer: Medicare Other

## 2015-12-08 DIAGNOSIS — J449 Chronic obstructive pulmonary disease, unspecified: Secondary | ICD-10-CM | POA: Diagnosis not present

## 2015-12-09 DIAGNOSIS — J449 Chronic obstructive pulmonary disease, unspecified: Secondary | ICD-10-CM | POA: Diagnosis not present

## 2015-12-10 DIAGNOSIS — J449 Chronic obstructive pulmonary disease, unspecified: Secondary | ICD-10-CM | POA: Diagnosis not present

## 2015-12-11 DIAGNOSIS — J449 Chronic obstructive pulmonary disease, unspecified: Secondary | ICD-10-CM | POA: Diagnosis not present

## 2015-12-12 DIAGNOSIS — J449 Chronic obstructive pulmonary disease, unspecified: Secondary | ICD-10-CM | POA: Diagnosis not present

## 2015-12-13 DIAGNOSIS — J449 Chronic obstructive pulmonary disease, unspecified: Secondary | ICD-10-CM | POA: Diagnosis not present

## 2015-12-14 DIAGNOSIS — J449 Chronic obstructive pulmonary disease, unspecified: Secondary | ICD-10-CM | POA: Diagnosis not present

## 2015-12-15 DEATH — deceased

## 2015-12-16 ENCOUNTER — Ambulatory Visit (HOSPITAL_COMMUNITY): Payer: Medicare Other | Admitting: Oncology

## 2015-12-30 ENCOUNTER — Encounter: Payer: Self-pay | Admitting: Internal Medicine

## 2016-01-12 NOTE — Progress Notes (Signed)
This is documentation of my visit at the skilled care facility of 01/02/2016. This is a patient with severe end-stage COPD who has been in and out of the hospital on multiple occasions recently. She has now elected hospice care which I think is appropriate. She looks more comfortable. She has no new complaints except for her chronic shortness of breath  She is awake and alert. She is using nasal oxygen. Her chest shows decreased breath sounds and some rhonchi. Her heart is regular. She has some edema of the lower extremities  She has COPD which is end-stage. She is now on hospice which I think is appropriate. She looks more comfortable. I don't plan to change any treatment

## 2016-01-12 DEATH — deceased

## 2016-04-17 IMAGING — CR DG CHEST 1V PORT
1 series · 1 of 1 positions shown · non-contrast
Comparison: CT chest 12/29/2013 and single view of the chest
12/29/2013.

CLINICAL DATA: Shortness of breath, wheezing, fever and congestion
for 1 day.

EXAM:
PORTABLE CHEST - 1 VIEW

[portable]
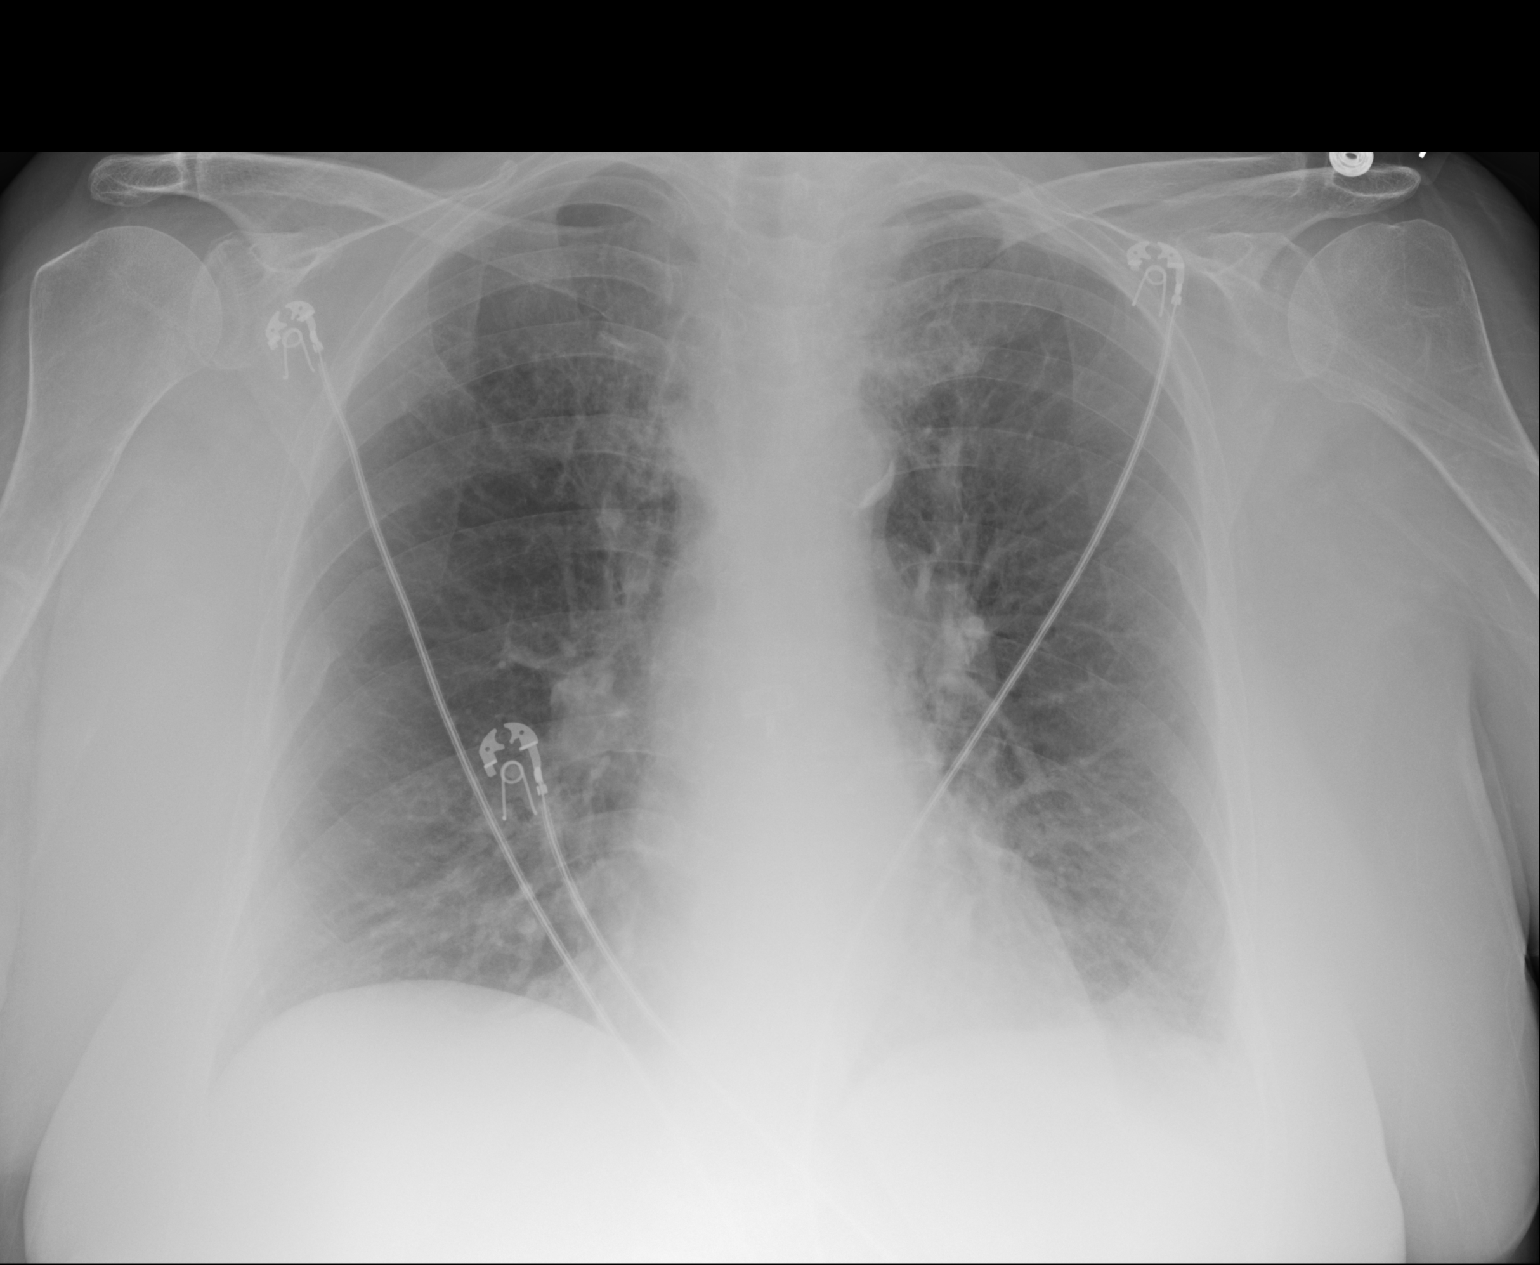

[1 of 1 positions shown; findings below may reference images not displayed]

FINDINGS: The lungs are emphysematous but clear. Heart size is normal. No
pneumothorax or pleural effusion.
IMPRESSION: Emphysema without acute disease.

## 2016-08-10 IMAGING — CR DG CHEST 1V PORT
1 series · 2 of 2 positions shown · non-contrast
Comparison: None.

CLINICAL DATA: Acute left-sided chest pain.

EXAM:
PORTABLE CHEST - 1 VIEW

[Series 3: ap portable · 0.17mm/px · 2 of 2 slices shown]
[im 1/2]
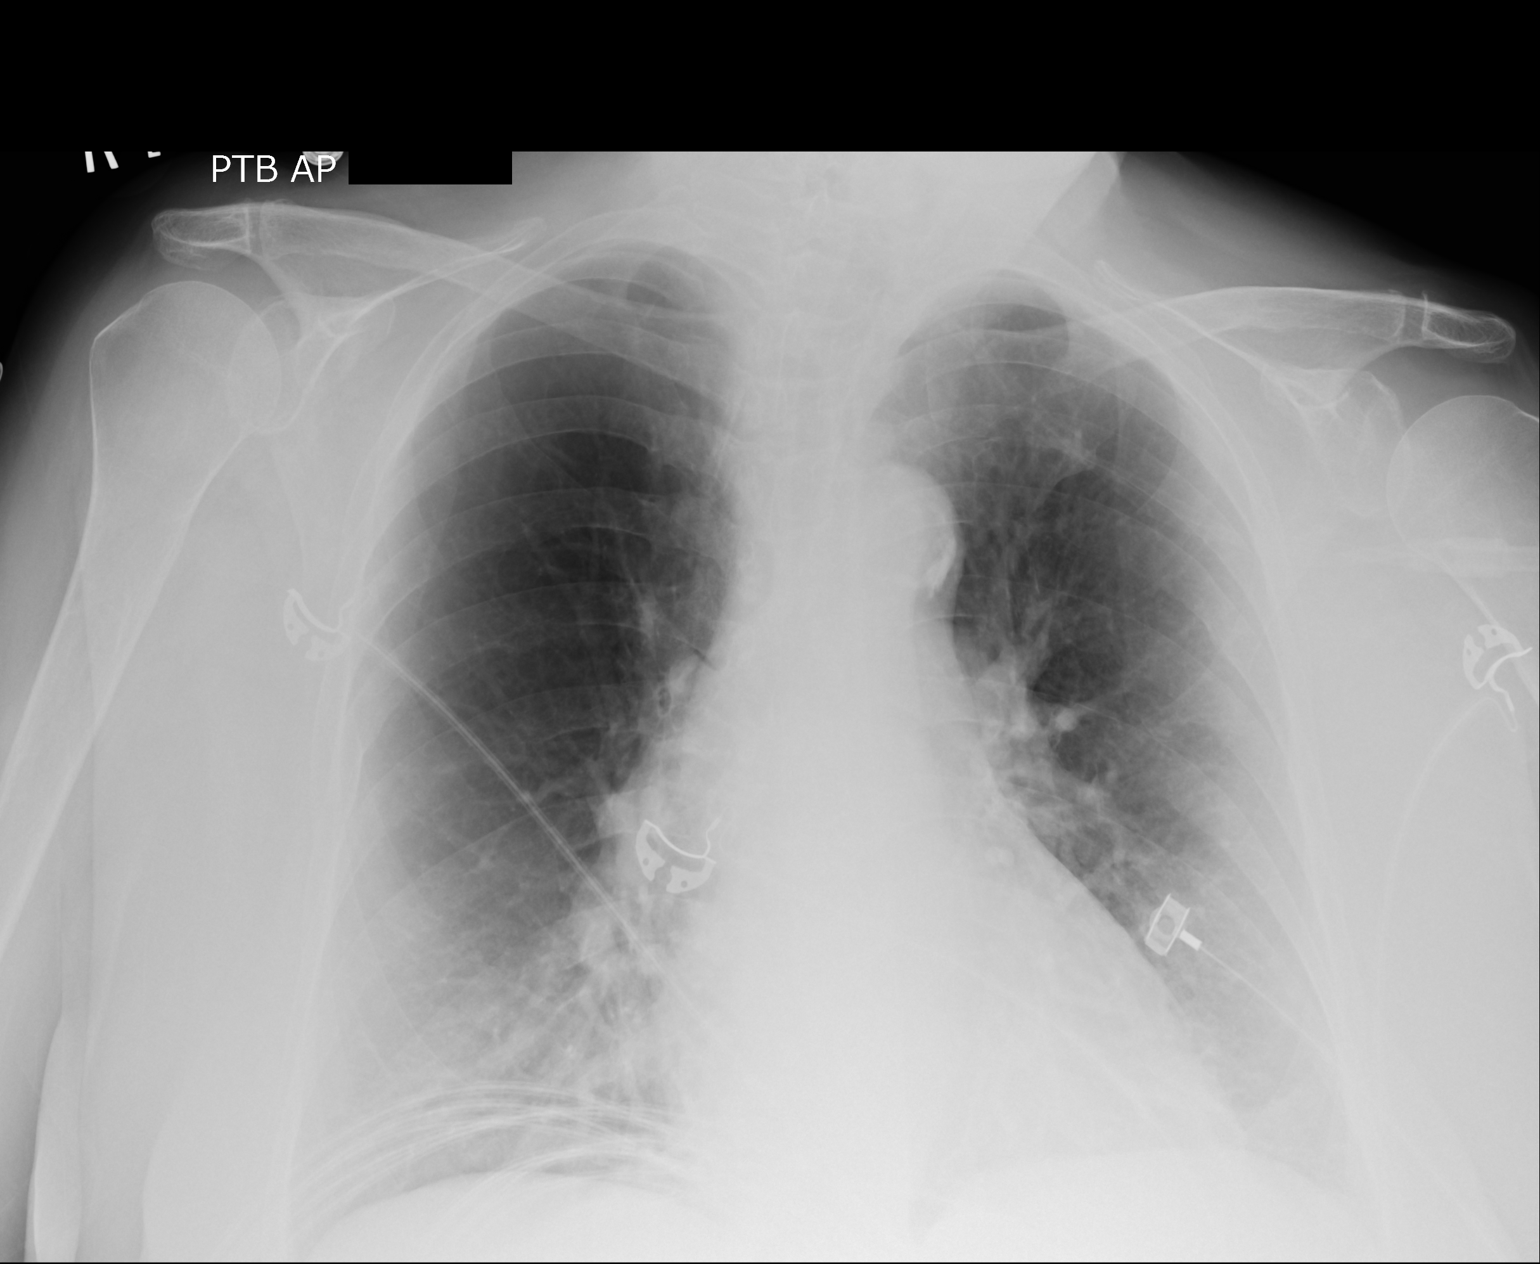
[im 2/2]
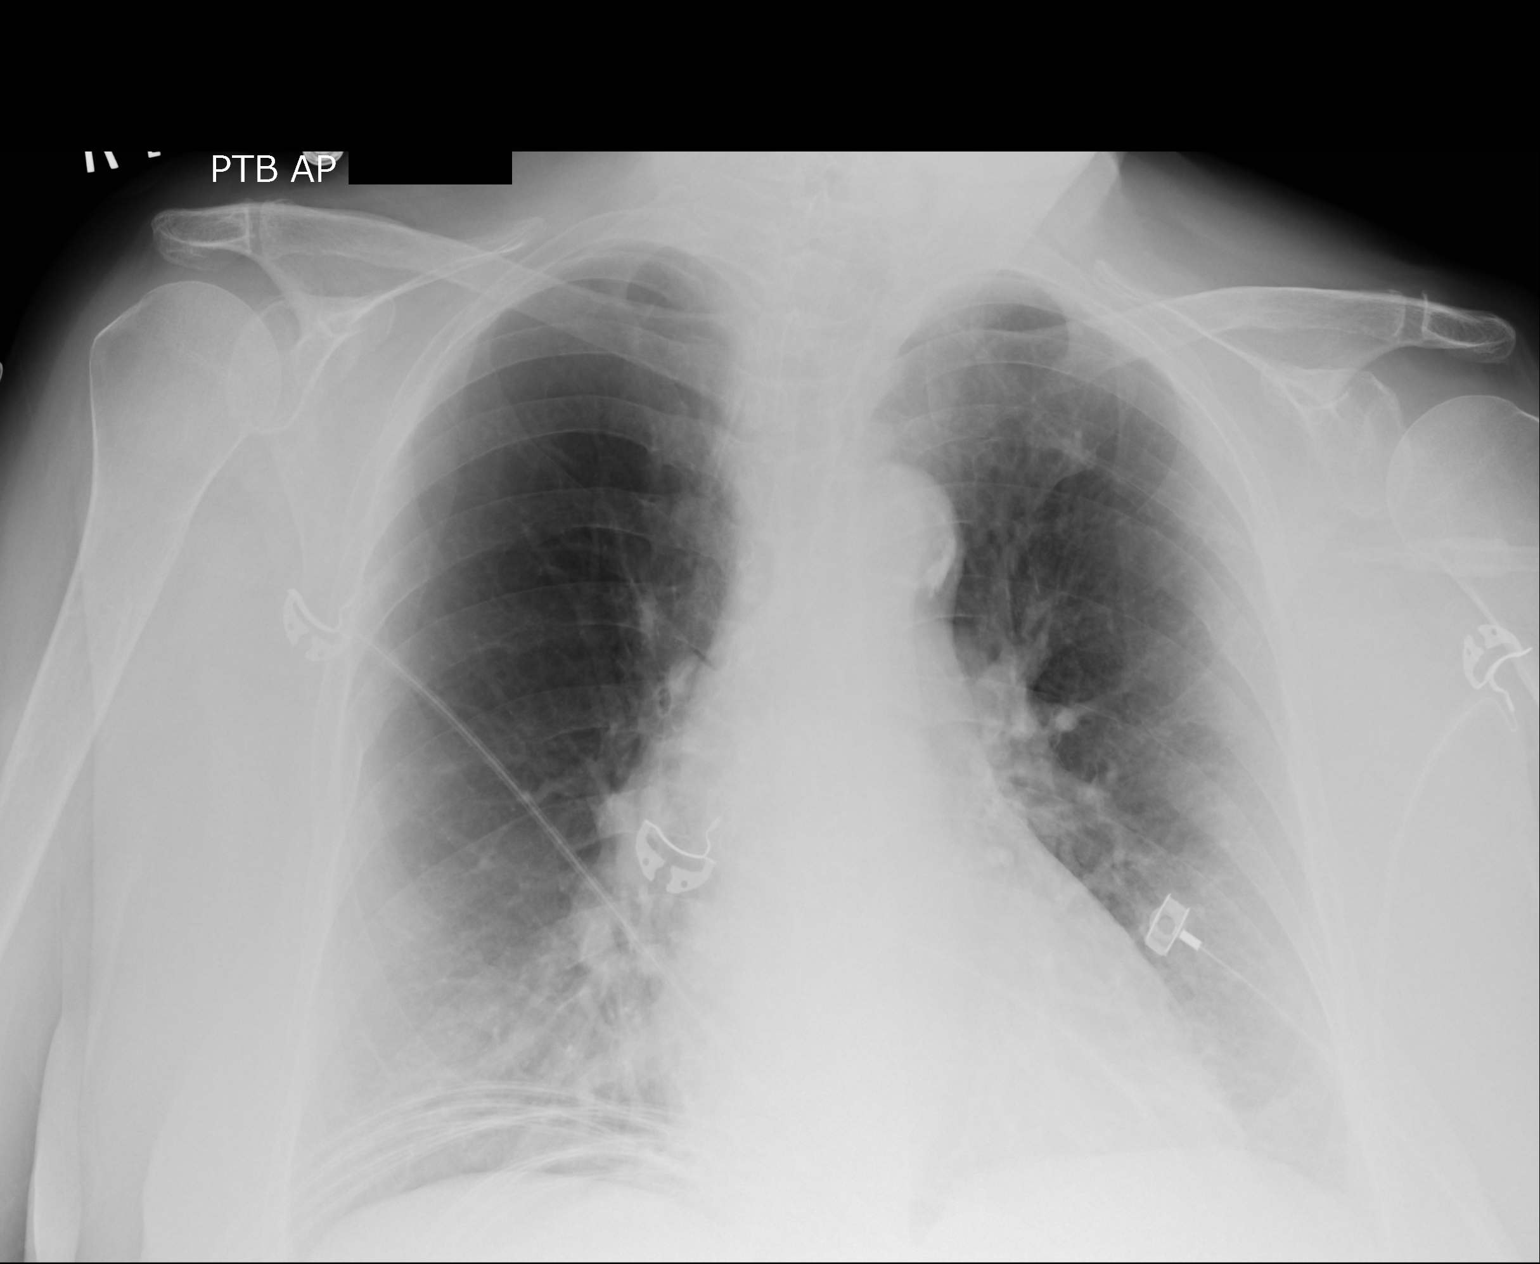

[2 of 2 positions shown; findings below may reference images not displayed]

FINDINGS: The heart size and mediastinal contours are within normal limits.
Both lungs are clear. No pneumothorax or pleural effusion is noted.
Hyperexpansion of the lungs is noted consistent with chronic
obstructive pulmonary disease. The visualized skeletal structures
are unremarkable.
IMPRESSION: Findings consistent with chronic obstructive pulmonary disease. No
acute cardiopulmonary abnormality seen.

## 2016-12-30 IMAGING — DX DG CHEST 1V
1 series · 1 of 1 positions shown · non-contrast
Comparison: Single view of the chest 06/12/2015 and 05/15/2015. CT
chest 12/29/2013.

CLINICAL DATA: Productive cough and left-sided chest pain since a
fall 3 weeks ago.

EXAM:
CHEST  1 VIEW

[chest ap]
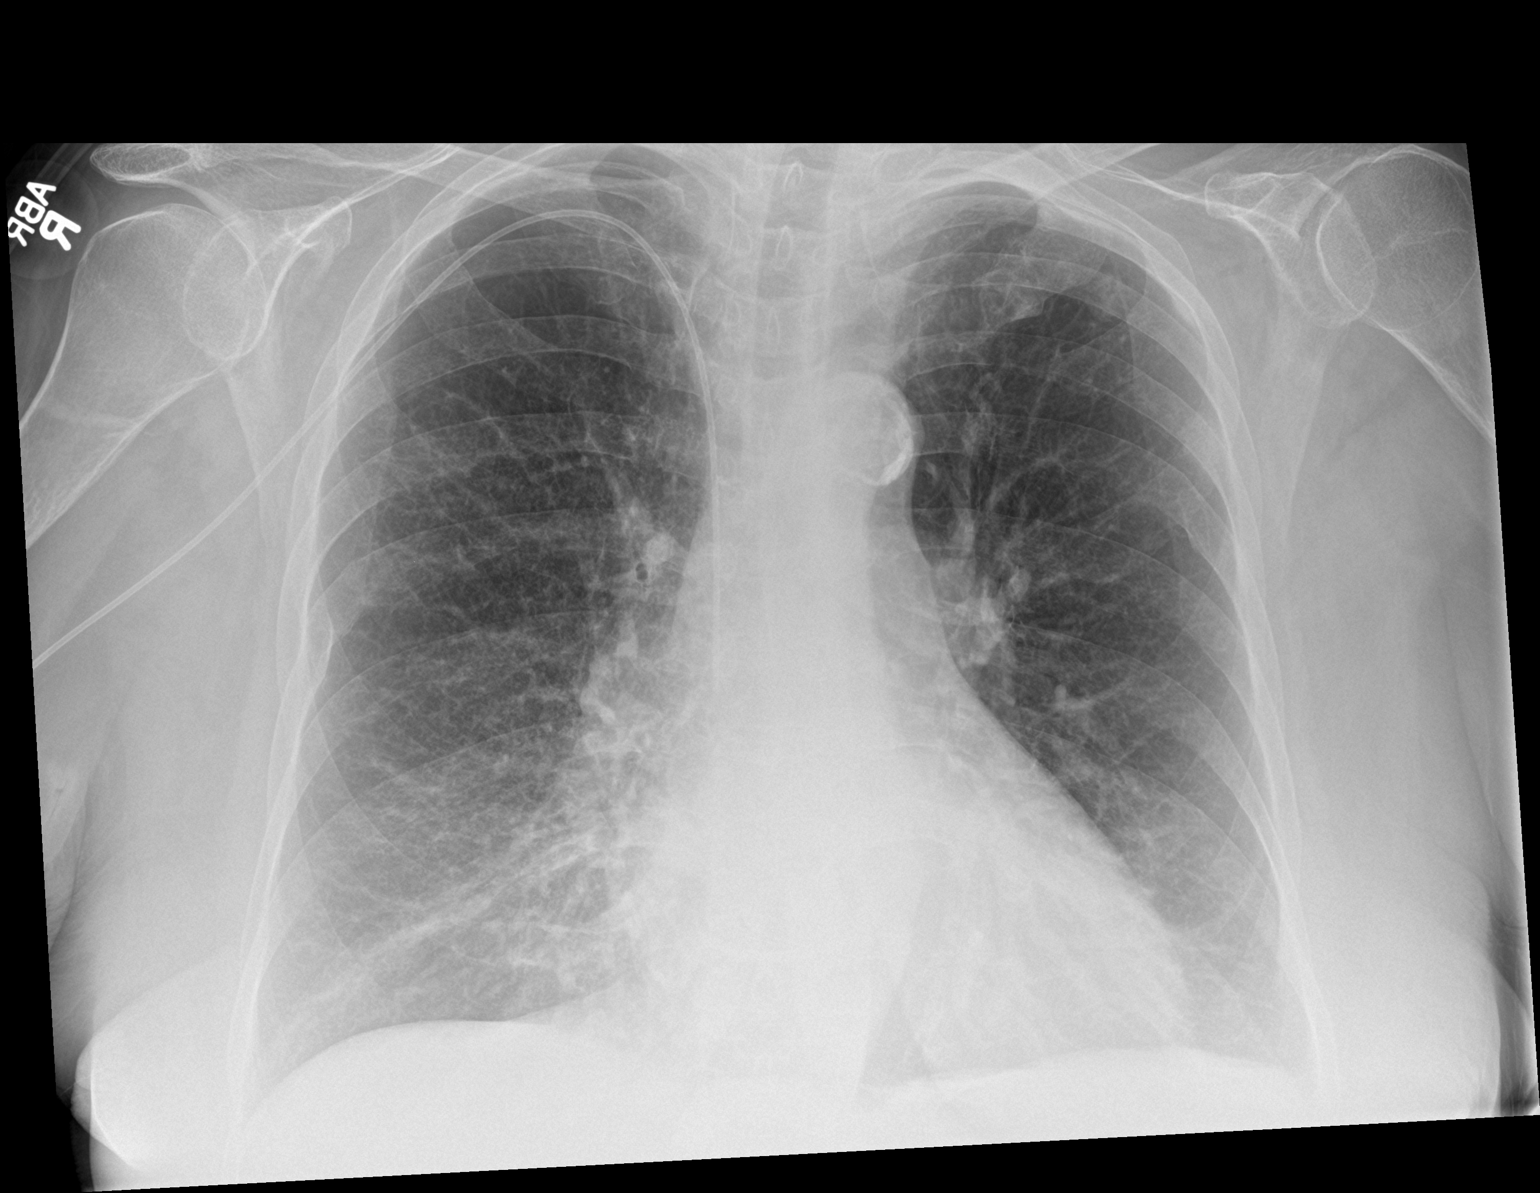

[1 of 1 positions shown; findings below may reference images not displayed]

FINDINGS: Left PICC remains in place with the tip just above the superior
cavoatrial junction. The lungs are emphysematous but clear. Heart
size is upper normal. No pneumothorax or pleural effusion. Aortic
atherosclerosis noted. Remote bilateral rib fractures are seen.
IMPRESSION: No acute disease.

COPD.

Atherosclerosis.

## 2019-10-18 NOTE — Progress Notes (Signed)
REVIEWED-NO ADDITIONAL RECOMMENDATIONS.
